# Patient Record
Sex: Female | Born: 1990 | Race: Black or African American | Hispanic: No | Marital: Single | Smoking: Current every day smoker
Health system: Southern US, Community
[De-identification: ages and names within clinical notes are randomized; demographics above are authoritative.]

## PROBLEM LIST (undated history)

## (undated) ENCOUNTER — Inpatient Hospital Stay (HOSPITAL_COMMUNITY): Payer: Self-pay

## (undated) DIAGNOSIS — Z3490 Encounter for supervision of normal pregnancy, unspecified, unspecified trimester: Secondary | ICD-10-CM

## (undated) DIAGNOSIS — F419 Anxiety disorder, unspecified: Secondary | ICD-10-CM

## (undated) DIAGNOSIS — F32A Depression, unspecified: Secondary | ICD-10-CM

## (undated) DIAGNOSIS — U071 COVID-19: Secondary | ICD-10-CM

## (undated) DIAGNOSIS — J302 Other seasonal allergic rhinitis: Secondary | ICD-10-CM

## (undated) DIAGNOSIS — D649 Anemia, unspecified: Secondary | ICD-10-CM

## (undated) DIAGNOSIS — Z309 Encounter for contraceptive management, unspecified: Secondary | ICD-10-CM

## (undated) DIAGNOSIS — B009 Herpesviral infection, unspecified: Secondary | ICD-10-CM

## (undated) DIAGNOSIS — M5430 Sciatica, unspecified side: Secondary | ICD-10-CM

## (undated) DIAGNOSIS — J189 Pneumonia, unspecified organism: Secondary | ICD-10-CM

## (undated) DIAGNOSIS — Z8709 Personal history of other diseases of the respiratory system: Secondary | ICD-10-CM

## (undated) DIAGNOSIS — R87629 Unspecified abnormal cytological findings in specimens from vagina: Secondary | ICD-10-CM

## (undated) DIAGNOSIS — K219 Gastro-esophageal reflux disease without esophagitis: Secondary | ICD-10-CM

## (undated) DIAGNOSIS — R51 Headache: Secondary | ICD-10-CM

## (undated) DIAGNOSIS — J45909 Unspecified asthma, uncomplicated: Secondary | ICD-10-CM

## (undated) HISTORY — DX: Herpesviral infection, unspecified: B00.9

## (undated) HISTORY — DX: Unspecified abnormal cytological findings in specimens from vagina: R87.629

## (undated) HISTORY — DX: Headache: R51

## (undated) HISTORY — DX: Encounter for contraceptive management, unspecified: Z30.9

## (undated) HISTORY — PX: BACK SURGERY: SHX140

## (undated) HISTORY — PX: OTHER SURGICAL HISTORY: SHX169

## (undated) HISTORY — DX: Sciatica, unspecified side: M54.30

## (undated) HISTORY — DX: Personal history of other diseases of the respiratory system: Z87.09

## (undated) HISTORY — DX: Unspecified asthma, uncomplicated: J45.909

---
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
## 2009-08-12 ENCOUNTER — Emergency Department (HOSPITAL_COMMUNITY): Admission: EM | Admit: 2009-08-12 | Discharge: 2009-08-12 | Payer: Self-pay | Admitting: Emergency Medicine

## 2009-12-06 ENCOUNTER — Emergency Department (HOSPITAL_COMMUNITY): Admission: EM | Admit: 2009-12-06 | Discharge: 2009-12-06 | Payer: Self-pay | Admitting: Emergency Medicine

## 2010-07-11 ENCOUNTER — Emergency Department (HOSPITAL_COMMUNITY)
Admission: EM | Admit: 2010-07-11 | Discharge: 2010-07-11 | Payer: Self-pay | Source: Home / Self Care | Admitting: Emergency Medicine

## 2010-11-05 LAB — CBC
MCHC: 33.2 g/dL (ref 30.0–36.0)
RDW: 13.1 % (ref 11.5–15.5)

## 2010-11-05 LAB — URINALYSIS, ROUTINE W REFLEX MICROSCOPIC
Ketones, ur: NEGATIVE mg/dL
Nitrite: NEGATIVE
Protein, ur: NEGATIVE mg/dL

## 2010-11-05 LAB — GC/CHLAMYDIA PROBE AMP, GENITAL
Chlamydia, DNA Probe: NEGATIVE
GC Probe Amp, Genital: NEGATIVE

## 2010-11-05 LAB — PREGNANCY, URINE: Preg Test, Ur: NEGATIVE

## 2010-11-25 LAB — BASIC METABOLIC PANEL
BUN: 8 mg/dL (ref 6–23)
Calcium: 9.2 mg/dL (ref 8.4–10.5)
GFR calc non Af Amer: >60 mL/min (ref >60)
Glucose, Bld: 99 mg/dL (ref 70–99)
Potassium: 3.3 mEq/L — ABNORMAL LOW (ref 3.5–5.1)

## 2010-11-25 LAB — DIFFERENTIAL
Basophils Absolute: 0 10*3/uL (ref 0.0–0.1)
Basophils Relative: 1 % (ref 0–1)
Eosinophils Absolute: 0.6 10*3/uL (ref 0.0–0.7)
Eosinophils Relative: 9 % — ABNORMAL HIGH (ref 0–5)
Lymphocytes Relative: 35 % (ref 12–46)

## 2010-11-25 LAB — CBC
HCT: 37.9 % (ref 36.0–46.0)
MCHC: 33.6 g/dL (ref 30.0–36.0)
Platelets: 208 10*3/uL (ref 150–400)
RDW: 13.3 % (ref 11.5–15.5)

## 2011-01-10 NOTE — Discharge Summary (Signed)
Kaylee Miller, Kaylee Miller            ACCOUNT NO.:  [id number]   MEDICAL RECORD NO.:  [id number]          PATIENT TYPE:  INP   LOCATION:  A413                          FACILITY:  [facility]   PHYSICIAN:  Lazaro Arms, M.D.   DATE OF BIRTH:  22-Oct-1990   DATE OF ADMISSION:  06/10/2006  DATE OF DISCHARGE:  10/21/2007LH                                 DISCHARGE SUMMARY   DISCHARGE DIAGNOSES:  1. Status post a primary cesarean section.  2. Secondary arrest of diltation, first stage of labor.  3. Unremarkable postoperative course.   PROCEDURES:  As above.   Please refer to the history and physical __________ on the chart.   DETAILS OF ADMISSION TO THE HOSPITAL:  The patient came in early latent  phase labor.  She was augmented.  She underwent an epidural.  Got to 7 cm  and really just did not get past 7-8 cm despite adequate labor.  She was  taken for cesarean--please see the op note for details.   Postoperatively the patient has done well.  She tolerated clear liquids from  a regular diet.  She has voided without symptoms and has been ambulatory.  Her hemoglobin and hematocrit on postoperative day #1 was 9.2, and 26.7; on  postoperative day #3 was 7.6 and 22.1; but her bleeding was appropriate.  Her uterus was firm; and the patient is asymptomatic in ambulation.  Her  incision was clean, dry and intact.  She tolerated clear liquids and a  regular diet. She is voiding without symptoms, and tolerating oral pain  medicines. She is discharge to home on the morning of postoperative day #3  in good stable condition, and to followup in the office on Wednesday to have  her staples removed.  She is given instruction precautions for term prior to  that time.  She was given Tylox and Motrin for pain.      Lazaro Arms, M.D.  Electronically Signed     LHE/MEDQ  D:  06/14/2006  T:  06/15/2006  Job:  [id number]

## 2011-01-10 NOTE — H&P (Signed)
NAMEJAZZLYN, Kaylee Miller            ACCOUNT NO.:  [id number]   MEDICAL RECORD NO.:  [id number]          PATIENT TYPE:  inp   LOCATION:  LDR1                          FACILITY:  [facility]   PHYSICIAN:  [name], M.D. DATE OF BIRTH:  05/03/1991   DATE OF ADMISSION:  06/10/2006  DATE OF DISCHARGE:  LH                                HISTORY & PHYSICAL   REASON FOR ADMISSION:  Pregnancy at 39 weeks 5 days for elective induction  of labor due to maternal discomfort and insomnia.   MEDICAL HISTORY:  Positive for chronic bronchitis.   SURGICAL HISTORY:  Positive for adenoids and tubes in her ear.   ALLERGIES:  No known allergies.   FAMILY HISTORY:  Positive for hypertension and diabetes.   PRENATAL COURSE:  Uneventful.  Blood type is A positive.  UDS negative.  Rubella is immune.  Hepatitis B surface antigen negative.  HIV negative.  HSV negative.  Serology is nonreactive.  Pap is normal.  Repeat GC and  chlamydia are also negative.  AFP was abnormal and ultrasound revealed  normal.  GBS is negative.  A 28-week hemoglobin 10.9, 28-week hematocrit  33.5.  One-hour glucose 105.  Sickle cell screen is negative.   PHYSICAL EXAMINATION:  Vital signs are stable.  Fetal heart rate pattern is  stable.   PLAN:  We are going to admit for a Foley bulb induction of labor.      [name], Lanier Clam      [name], M.D.  Electronically Signed    DL/MEDQ  D:  16/05/9603  T:  06/09/2006  Job:  [id number]   cc:   Francoise Schaumann. Milford Cage DO, FAAP  Fax: [phone]

## 2011-01-10 NOTE — H&P (Signed)
Kaylee Miller, Kaylee Miller            ACCOUNT NO.:  [id number]   MEDICAL RECORD NO.:  [id number]          PATIENT TYPE:  OIB   LOCATION:  LDR5                          FACILITY:  [facility]   PHYSICIAN:  [name], M.D. DATE OF BIRTH:  [birth year]   DATE OF ADMISSION:  04/20/2006  DATE OF DISCHARGE:  LH                                HISTORY & PHYSICAL   HISTORY AND PHYSICAL OBSERVATION NOTE   REASON FOR ADMISSION:  Pregnancy at 32 weeks with nosebleed and lower  abdominal pain on the right side.   HISTORY OF PRESENT ILLNESS:  The patient was evaluated in the emergency room  in regards to nosebleed.  By the time the patient had arrived the nosebleed  had already abated.  She was then assessed and sent upstairs to be  monitored.   PAST MEDICAL HISTORY:  Positive for bronchitis.   PAST SURGICAL HISTORY:  Positive for tubes and adenoids.   ALLERGIES:  She has no known drug allergies.   OBSTETRICAL HISTORY:  Her prenatal course was essentially uneventful.  She  has had several nosebleeds during the course of the pregnancy but she also  states that she had then when she was a small child.   PHYSICAL EXAMINATION:  VITAL SIGNS:  Vital signs are stable.  PELVIC:  Cervix is firm, posterior, long, closed. Presenting part ballots.   PLAN:  The patient will be placed on a monitor to be evaluated.  We will  observe her.  Will get a CBC.  Check a urine. If no uterine contractions we  will discharge her home with followup one day this week in the office.      [name], Lanier Clam      [name], M.D.  Electronically Signed    DL/MEDQ  D:  16/05/9603  T:  04/20/2006  Job:  [id number]

## 2011-01-10 NOTE — Op Note (Signed)
Kaylee Miller, Kaylee Miller            ACCOUNT NO.:  [id number]   MEDICAL RECORD NO.:  [id number]          PATIENT TYPE:  INP   LOCATION:  A413                          FACILITY:  [facility]   PHYSICIAN:  [name], M.D. DATE OF BIRTH:  1990-11-21   DATE OF PROCEDURE:  DATE OF DISCHARGE:                                 OPERATIVE REPORT   PREOPERATIVE DIAGNOSIS:  Pregnancy, 39 plus [redacted] weeks gestation. Failed  induction secondary to cephalopelvic disproportion.   POSTOPERATIVE DIAGNOSIS:  Pregnancy, 39 plus [redacted] weeks gestation. Failed  induction secondary to cephalopelvic disproportion. Increased intraoperative  blood loss secondary to lower uterine segment varicosities.   SURGEON:  [name], M.D.   ASSISTANTMarlinda Mike, R.N.   ANESTHESIA:  Epidural, Dr. Despina Hidden, topped up for cesarean section.   COMPLICATIONS:  Increased blood loss from lower uterine segment where we  made the transverse uterine incision.   FINDINGS:  Healthy female infant, Apgars 9 and 9 in occiput posterior  presentation with arrest of labor at anterior lip dilation.   DETAILS OF PROCEDURE:  The patient was taken to the operating room, prepped  and draped for lower abdominal surgery. After epidural was adequately topped  up, a Pfannenstiel-type incision was performed, sharply excising through the  skin, Bovie cautery through the fatty tissues. Fascia opened transversely.  Peritoneum opened in the midline. The bladder flap was developed on the  lower uterine segment. Transverse uterine incision was made, extended  laterally using index finger traction, and the operator's left hand placed  beneath the vertex to rotate the head into the incision. This was somewhat  challenging as the occiput posterior presentation was impacted markedly in  the pelvis, and elevation of the shoulders were necessary to get the end  into the incision, but then it delivered easily thereafter. No traction on  the head was performed after  the head was released, after the head entered  the incision. Infant had bulb suctioning performed of the pharynx prior to  delivery of the fetal body. Infant was passed to Dr. Marvel Plan for subsequent  care. See his notes for details. Apgars 9 and 9 were assigned. Cord blood  samples were obtained, and placenta delivered [facility] presentation. Membranes  were removed completely. The lower uterine segment where we were operating  was filled with a plexus of varicosities. We tried to control it with  Pennington clamps, but nonetheless a huge amount of varicosities to deal  with. The right side of the uterine incision was packed with laparotomy tape  while we worked on the left side, then when reaching the midline, the right  side was initiated at the corner and brought back to midline. The second  layer of running locking closure was performed. We encountered several  varicosities right in the midline that required additional suturing to  control them. Eventually, hemostasis was adequate, but blood loss was  significant for this portion of the case, estimated 1500 cc. Once the lower  uterine segment was completely controlled, we were in good shape. Bladder  flap was reapproximated. Abdomen irrigated copiously. Some clots extracted  from the  abdomen, and the peritoneal fluid was somewhat relatively clear,  and then 2-0 chromic closure of the peritoneum performed. The fascia was  closed with 0 Vicryl after muscles pulled together in the midline. The  subcutaneous fatty tissues were irrigated with antibiotic solution,  confirmed as hemostatic. We reapproximated with interrupted 2-0 plain x3  sutures and then staple closure of the skin performed. The patient tolerated  the procedure well and went to the recovery room in good condition,  remaining with pulse in the 96 range at the end of the procedure.  Postoperative hemoglobin is scheduled this evening.      [name], M.D.   Electronically Signed     JVF/MEDQ  D:  06/11/2006  T:  06/13/2006  Job:  [id number]

## 2011-01-10 NOTE — H&P (Signed)
Kaylee Miller, ROCHETTE            ACCOUNT NO.:  [id number]   MEDICAL RECORD NO.:  [id number]          PATIENT TYPE:  OIB   LOCATION:  A415                          FACILITY:  [facility]   PHYSICIAN:  [name], M.D. DATE OF BIRTH:  1991-05-16   DATE OF ADMISSION:  05/25/2006  DATE OF DISCHARGE:  LH                                HISTORY & PHYSICAL   REASON FOR ADMISSION:  Pregnancy at 37 weeks with mild irregular uterine  contractions. Kaylee Miller had been up and down with discomfort most of the  night. Presents with irregular uterine contractions at present.   PAST MEDICAL HISTORY:  Positive for chronic bronchitis.   PAST SURGICAL HISTORY:  Positive for _tonsillectomy__ and adenoids.   ALLERGIES:  She has no known allergies.   FAMILY HISTORY:  Positive for hypertension, diabetes, and coronary artery  disease.   PRENATAL COURSE:  Essentially uneventful. Blood type A positive. UGS  negative, rubella immune, hepatitis B surface antigen negative, HIV  negative, HSV negative, __rpr________ nonreactive. Pap class I. GC negative,  GBS negative. AFP was at risk for open spine defect.   LABORATORY DATA:  Hemoglobin 2.9, hematocrit 33.5. One hour glucose 195.   PHYSICAL EXAMINATION:  VITAL SIGNS:  Stable.  GENITALIA:  Cervix is 1 cm. The patient is having irregular uterine  contractions.      Kaylee Miller, Kaylee Miller      [name], M.D.  Electronically Signed    DL/MEDQ  D:  16/05/9603  T:  05/25/2006  Job:  [id number]

## 2011-01-10 NOTE — H&P (Signed)
NAMEKONSTANCE, HAPPEL            ACCOUNT NO.:  [id number]   MEDICAL RECORD NO.:  [id number]          PATIENT TYPE:  OBV   LOCATION:  LDR1                          FACILITY:  [facility]   PHYSICIAN:  [name], M.D. DATE OF BIRTH:  [birth year]   DATE OF ADMISSION:  06/09/2006  DATE OF DISCHARGE:  LH                                HISTORY & PHYSICAL   HISTORY OF PRESENT ILLNESS:  Yailyn is a 20 year old gravida 1 at 19 and 4  who was in with irregular contractions. After several hours of observation,  she rested and slept during the night. Cervix this morning on exam was 1-2,  80 to 90% effaced, -1 to 0 station. After discussing carefully with her the  risks and benefits of induction of labor, she desires induction of labor and  that is planned for Wednesday the 17th.      [name], Lanier Clam      [name], M.D.  Electronically Signed    DL/MEDQ  D:  16/05/9603  T:  06/09/2006  Job:  [id number]   cc:   [facility]

## 2011-01-10 NOTE — Op Note (Signed)
NAMEKORTNIE, STOVALL            ACCOUNT NO.:  [id number]   MEDICAL RECORD NO.:  [id number]          PATIENT TYPE:  INP   LOCATION:  A413                          FACILITY:  [facility]   PHYSICIAN:  Lazaro Arms, M.D.   DATE OF BIRTH:  08-Nov-1990   DATE OF PROCEDURE:  06/11/2006  DATE OF DISCHARGE:                                 OPERATIVE REPORT   HISTORY OF PRESENT ILLNESS:  Deneen is a 20 year old gravida 1, para 0 in  early phase of labor requesting epidural be placed.  She has had IV pain  medications.   The patient was placed in the sitting position.  Betadine prep as usual, 1%  lidocaine was injected in the L3-L4 inner space.  A field drape was placed.  A 17-gauge Tuohy needle was used.  Loss of resistance technique employed,  and the epidural space was found with one pass without difficulty, and 10 cc  of 0.125% bupivacaine plain was given as a test dose without ill effects.  Epidural catheter was fed easily and take down 5 cm of epidural space.  In  addition, 10 cc of 0.125% bupivacaine plain was given to dose up the  epidural.  The continuous infusion of 0.125% bupivacaine with 2 mcg/cc of  Fentanyl was given at 2 cc an hour.      Lazaro Arms, M.D.  Electronically Signed     LHE/MEDQ  D:  06/11/2006  T:  06/12/2006  Job:  [id number]

## 2011-03-10 ENCOUNTER — Emergency Department (HOSPITAL_COMMUNITY)
Admission: EM | Admit: 2011-03-10 | Discharge: 2011-03-10 | Disposition: A | Discharge status: Home / Self Care | Payer: Medicaid Other | Attending: Emergency Medicine | Admitting: Emergency Medicine

## 2011-03-10 ENCOUNTER — Encounter: Payer: Self-pay | Admitting: *Deleted

## 2011-03-10 DIAGNOSIS — J302 Other seasonal allergic rhinitis: Secondary | ICD-10-CM

## 2011-03-10 DIAGNOSIS — H109 Unspecified conjunctivitis: Secondary | ICD-10-CM

## 2011-03-10 DIAGNOSIS — J301 Allergic rhinitis due to pollen: Secondary | ICD-10-CM | POA: Insufficient documentation

## 2011-03-10 DIAGNOSIS — F17200 Nicotine dependence, unspecified, uncomplicated: Secondary | ICD-10-CM | POA: Insufficient documentation

## 2011-03-10 HISTORY — DX: Other seasonal allergic rhinitis: J30.2

## 2011-03-10 MED ORDER — FEXOFENADINE-PSEUDOEPHED ER 60-120 MG PO TB12: 1.0000 | ORAL_TABLET | Freq: Two times a day (BID) | ORAL | Status: AC

## 2011-03-10 MED ORDER — ERYTHROMYCIN 5 MG/GM OP OINT
TOPICAL_OINTMENT | Freq: Once | OPHTHALMIC | Status: AC
  Administered 2011-03-10: 16:00:00 via OPHTHALMIC
  Filled 2011-03-10: qty 3.5

## 2011-03-10 NOTE — ED Notes (Signed)
Pt states redness and minor swelling to eyes. Pt states she has bad allergies and swelling is worse in the mornings. Pt also states her son had recent eye infection and wants to make sure she does not have an infection. Pt also states intermittent headache above eyes. NAD.

## 2011-03-10 NOTE — ED Provider Notes (Signed)
History     Chief Complaint  Patient presents with  . Eye Pain   Patient is a 20 y.o. female presenting with eye pain. The history is provided by the patient.  Eye Pain This is a new problem. The current episode started yesterday. The problem occurs constantly. The problem has been unchanged. Associated symptoms include congestion, headaches and a sore throat. Pertinent negatives include no abdominal pain, arthralgias, chest pain, chills, coughing, fever, joint swelling, nausea, neck pain, numbness, rash, visual change or weakness. Associated symptoms comments: Reports chronic history of allergy including itchy watery eyes and nasal congestion.  Woke yesterday with irritation to both eyes,  And copious yellow thick drainage.  Son was treated for pinkeye last week.  . Exacerbated by: blinking. She has tried nothing for the symptoms.    Past Medical History  Diagnosis Date  . Seasonal allergies     Past Surgical History  Procedure Date  . Adenoids     History reviewed. No pertinent family history.  History  Substance Use Topics  . Smoking status: Current Everyday Smoker    Types: Cigarettes  . Smokeless tobacco: Not on file  . Alcohol Use: Yes     OCC    OB History    Grav Para Term Preterm Abortions TAB SAB Ect Mult Living                  Review of Systems  Constitutional: Negative for fever and chills.  HENT: Positive for congestion, sore throat, rhinorrhea, sneezing and postnasal drip. Negative for neck pain, neck stiffness, sinus pressure and ear discharge.   Eyes: Positive for pain.  Respiratory: Negative for cough, chest tightness and shortness of breath.   Cardiovascular: Negative for chest pain.  Gastrointestinal: Negative for nausea and abdominal pain.  Genitourinary: Negative.   Musculoskeletal: Negative for joint swelling and arthralgias.  Skin: Negative.  Negative for rash and wound.  Neurological: Positive for headaches. Negative for dizziness, weakness,  light-headedness and numbness.  Hematological: Negative.   Psychiatric/Behavioral: Negative.     Physical Exam  Pulse 79  Temp(Src) 98.8 F (37.1 C) (Oral)  Resp 16  Ht 5\' 6"  (1.676 m)  Wt 148 lb (67.132 kg)  BMI 23.89 kg/m2  SpO2 100%  LMP 03/09/2011  Physical Exam  Vitals reviewed. Constitutional: She is oriented to person, place, and time. She appears well-developed and well-nourished.  HENT:  Head: Normocephalic and atraumatic.  Right Ear: Tympanic membrane and ear canal normal.  Left Ear: Tympanic membrane and ear canal normal.  Nose: Mucosal edema and rhinorrhea present. Right sinus exhibits no maxillary sinus tenderness and no frontal sinus tenderness. Left sinus exhibits no maxillary sinus tenderness and no frontal sinus tenderness.  Mouth/Throat: Uvula is midline, oropharynx is clear and moist and mucous membranes are normal.  Eyes: EOM are normal. Pupils are equal, round, and reactive to light. Right eye exhibits discharge. Left eye exhibits discharge. Right conjunctiva is injected. Left conjunctiva is injected.  Neck: Normal range of motion.  Cardiovascular: Normal rate and regular rhythm.   Pulmonary/Chest: Effort normal and breath sounds normal. She has no wheezes.  Abdominal: Soft. Bowel sounds are normal. There is no tenderness.  Musculoskeletal: Normal range of motion.  Neurological: She is alert and oriented to person, place, and time.  Skin: Skin is warm and dry.  Psychiatric: She has a normal mood and affect.    ED Course  Procedures  MDM        Candis Musa,  PA 03/10/11 1620

## 2011-03-15 NOTE — ED Provider Notes (Signed)
Medical screening examination/treatment/procedure(s) were performed by non-physician practitioner and as supervising physician I was immediately available for consultation/collaboration. Devoria Albe, MD, Armando Gang  Ward Givens, MD 03/15/11 [phone]

## 2011-04-02 ENCOUNTER — Other Ambulatory Visit: Payer: Self-pay | Admitting: Adult Health

## 2011-04-02 ENCOUNTER — Other Ambulatory Visit (HOSPITAL_COMMUNITY)
Admission: RE | Admit: 2011-04-02 | Discharge: 2011-04-02 | Disposition: A | Discharge status: Home / Self Care | Payer: Medicaid Other | Source: Ambulatory Visit | Attending: Obstetrics and Gynecology | Admitting: Obstetrics and Gynecology

## 2011-04-02 DIAGNOSIS — Z01419 Encounter for gynecological examination (general) (routine) without abnormal findings: Secondary | ICD-10-CM | POA: Insufficient documentation

## 2011-04-02 DIAGNOSIS — Z113 Encounter for screening for infections with a predominantly sexual mode of transmission: Secondary | ICD-10-CM | POA: Insufficient documentation

## 2012-06-03 ENCOUNTER — Other Ambulatory Visit (HOSPITAL_COMMUNITY)
Admission: RE | Admit: 2012-06-03 | Discharge: 2012-06-03 | Disposition: A | Discharge status: Home / Self Care | Payer: Medicaid Other | Source: Ambulatory Visit | Attending: Obstetrics and Gynecology | Admitting: Obstetrics and Gynecology

## 2012-06-03 ENCOUNTER — Other Ambulatory Visit: Payer: Self-pay | Admitting: Adult Health

## 2012-06-03 DIAGNOSIS — Z3049 Encounter for surveillance of other contraceptives: Secondary | ICD-10-CM | POA: Insufficient documentation

## 2012-06-03 DIAGNOSIS — Z113 Encounter for screening for infections with a predominantly sexual mode of transmission: Secondary | ICD-10-CM | POA: Insufficient documentation

## 2012-11-07 ENCOUNTER — Emergency Department (HOSPITAL_COMMUNITY): Payer: Medicaid Other

## 2012-11-07 ENCOUNTER — Emergency Department (HOSPITAL_COMMUNITY)
Admission: EM | Admit: 2012-11-07 | Discharge: 2012-11-07 | Disposition: A | Discharge status: Home / Self Care | Payer: Medicaid Other | Attending: Emergency Medicine | Admitting: Emergency Medicine

## 2012-11-07 ENCOUNTER — Encounter (HOSPITAL_COMMUNITY): Payer: Self-pay

## 2012-11-07 DIAGNOSIS — F1010 Alcohol abuse, uncomplicated: Secondary | ICD-10-CM | POA: Insufficient documentation

## 2012-11-07 DIAGNOSIS — F17200 Nicotine dependence, unspecified, uncomplicated: Secondary | ICD-10-CM | POA: Insufficient documentation

## 2012-11-07 DIAGNOSIS — S0993XA Unspecified injury of face, initial encounter: Secondary | ICD-10-CM | POA: Insufficient documentation

## 2012-11-07 DIAGNOSIS — R040 Epistaxis: Secondary | ICD-10-CM

## 2012-11-07 DIAGNOSIS — F10929 Alcohol use, unspecified with intoxication, unspecified: Secondary | ICD-10-CM

## 2012-11-07 NOTE — ED Notes (Signed)
Sleeping, family @ bedside.
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
## 2012-11-07 NOTE — ED Notes (Signed)
Woken for discharge, pt groggy but able to get up and dress self. Gait better than upon admission. Given written instructions and discharged with her dad.
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
## 2012-12-08 ENCOUNTER — Other Ambulatory Visit: Payer: Self-pay | Admitting: Obstetrics & Gynecology

## 2012-12-08 DIAGNOSIS — O3680X0 Pregnancy with inconclusive fetal viability, not applicable or unspecified: Secondary | ICD-10-CM

## 2012-12-24 ENCOUNTER — Telehealth: Payer: Self-pay | Admitting: *Deleted

## 2012-12-24 NOTE — Telephone Encounter (Signed)
Pt called and stated she is having really bad pain, pressure, and cramping. She denied any bleeding. I spoke with Dr Despina Hidden and he advised that the pt should be fine and that she should call us back if it got worse or any bleeding started, and to keep her scheduled appointment. I advised the pt of this, the pt was given an appointment for the first part of next week to evaluate the problem.

## 2012-12-27 ENCOUNTER — Other Ambulatory Visit (INDEPENDENT_AMBULATORY_CARE_PROVIDER_SITE_OTHER): Payer: Medicaid Other

## 2012-12-27 ENCOUNTER — Other Ambulatory Visit: Payer: Self-pay | Admitting: Obstetrics & Gynecology

## 2012-12-27 ENCOUNTER — Ambulatory Visit (INDEPENDENT_AMBULATORY_CARE_PROVIDER_SITE_OTHER): Payer: Medicaid Other | Admitting: Obstetrics & Gynecology

## 2012-12-27 ENCOUNTER — Encounter: Payer: Self-pay | Admitting: Obstetrics & Gynecology

## 2012-12-27 VITALS — BP 110/60 | Ht 66.0 in | Wt 166.0 lb

## 2012-12-27 DIAGNOSIS — O200 Threatened abortion: Secondary | ICD-10-CM

## 2012-12-27 DIAGNOSIS — N949 Unspecified condition associated with female genital organs and menstrual cycle: Secondary | ICD-10-CM

## 2012-12-27 DIAGNOSIS — O99891 Other specified diseases and conditions complicating pregnancy: Secondary | ICD-10-CM

## 2012-12-27 LAB — US OB COMP LESS 14 WKS

## 2012-12-27 NOTE — Progress Notes (Signed)
Patient ID: Kaylee Miller, female   DOB: 04/30/1991, 22 y.o.   MRN: [id number]

## 2012-12-27 NOTE — Progress Notes (Signed)
Patient ID: Kaylee Miller, female   DOB: 1991-01-02, 22 y.o.   MRN: [id number] Patient presented as work in for LLQ pain in early pregnancy.  No bleeding  Sonogram reveals a viable IUP with corpus luteum on right, FHR 178  Pregnancy all appears good , keep follow up appointment

## 2012-12-27 NOTE — Progress Notes (Signed)
U/S-single IUP with +FCA noted FHR=178BPM, CRL c/w 8+4wks EDD 08/04/2013, cx long and closed, Rt Ov with C.L. Noted no free fluid noted

## 2013-01-04 ENCOUNTER — Other Ambulatory Visit: Payer: Self-pay

## 2013-01-05 ENCOUNTER — Encounter: Payer: Self-pay | Admitting: *Deleted

## 2013-01-05 DIAGNOSIS — G43909 Migraine, unspecified, not intractable, without status migrainosus: Secondary | ICD-10-CM

## 2013-01-05 DIAGNOSIS — B009 Herpesviral infection, unspecified: Secondary | ICD-10-CM

## 2013-01-06 ENCOUNTER — Ambulatory Visit (INDEPENDENT_AMBULATORY_CARE_PROVIDER_SITE_OTHER): Payer: Medicaid Other | Admitting: Adult Health

## 2013-01-06 ENCOUNTER — Encounter: Payer: Self-pay | Admitting: Adult Health

## 2013-01-06 VITALS — BP 110/68 | Wt 177.0 lb

## 2013-01-06 DIAGNOSIS — Z98891 History of uterine scar from previous surgery: Secondary | ICD-10-CM | POA: Insufficient documentation

## 2013-01-06 DIAGNOSIS — O98519 Other viral diseases complicating pregnancy, unspecified trimester: Secondary | ICD-10-CM

## 2013-01-06 DIAGNOSIS — Z3481 Encounter for supervision of other normal pregnancy, first trimester: Secondary | ICD-10-CM

## 2013-01-06 DIAGNOSIS — O34219 Maternal care for unspecified type scar from previous cesarean delivery: Secondary | ICD-10-CM

## 2013-01-06 DIAGNOSIS — Z1389 Encounter for screening for other disorder: Secondary | ICD-10-CM

## 2013-01-06 DIAGNOSIS — Z331 Pregnant state, incidental: Secondary | ICD-10-CM

## 2013-01-06 LAB — CBC
Platelets: 224 10*3/uL (ref 150–400)
RBC: 4.3 MIL/uL (ref 3.87–5.11)
RDW: 13.6 % (ref 11.5–15.5)
WBC: 6.4 10*3/uL (ref 4.0–10.5)

## 2013-01-06 LAB — POCT URINALYSIS DIPSTICK
Blood, UA: NEGATIVE
Protein, UA: NEGATIVE

## 2013-01-06 NOTE — Patient Instructions (Addendum)
Follow up in 2 weeks IT/NT and see fran TRY flintstones Pregnancy, First Trimester The first trimester is the first 3 months your baby is growing inside you. It is important to follow your doctor's instructions. HOME CARE   Do not smoke.  Do not drink alcohol.  Only take medicine as told by your doctor.  Exercise.  Eat healthy foods. Eat regular, well-balanced meals.  You can have sex (intercourse) if there are no other problems with the pregnancy.  Things that help with morning sickness:  Eat soda crackers before getting up in the morning.  Eat 4 to 5 small meals rather than 3 large meals.  Drink liquids between meals, not during meals.  Go to all appointments as told.  Take all vitamins or supplements as told by your doctor. GET HELP RIGHT AWAY IF:   You develop a fever.  You have a bad smelling fluid that is leaking from your vagina.  There is bleeding from the vagina.  You develop severe belly (abdominal) or back pain.  You throw up (vomit) blood. It may look like coffee grounds.  You lose more than 2 pounds in a week.  You gain 5 pounds or more in a week.  You gain more than 2 pounds in a week and you see puffiness (swelling) in your feet, ankles, or legs.  You have severe dizziness or pass out (faint).  You are around people who have [place] measles, chickenpox, or fifth disease.  You have a headache, watery poop (diarrhea), pain with peeing (urinating), or cannot breath right. Document Released: 01/28/2008 Document Revised: 11/03/2011 Document Reviewed: 01/28/2008 [facility] Patient Information 2013 [place], [place].

## 2013-01-06 NOTE — Progress Notes (Signed)
  Subjective:    Kaylee Miller is a G2P1000 [redacted]w[redacted]d being seen today for her first obstetrical visit.  Her obstetrical history is significant for nausea and vomiting. She thinks it is her prenatal vitamins causing it, will stop them and try flintsones.She is a prior C-section and is thinking about VBAC. And she thinks she wants a BTL.  Pregnancy history fully reviewed.  Patient reports no bleeding.  Filed Vitals:   01/06/13 1146  BP: 110/68  Weight: 177 lb (80.287 kg)    HISTORY: OB History   Grav Para Term Preterm Abortions TAB SAB Ect Mult Living   2 1 1             # Outc Date GA Lbr Len/2nd Wgt Sex Del Anes PTL Lv   1 TRM 10/07 [redacted]w[redacted]d   M LTCS EPI     2 CUR              Past Medical History  Diagnosis Date  . Seasonal allergies   . Headache     migraines  . HSV-2 (herpes simplex virus 2) infection   . History of bronchitis    Past Surgical History  Procedure Laterality Date  . Adenoids    . Cesarean section  2007  . Tubes in ears     Family History  Problem Relation Age of Onset  . Diabetes Other   . CAD Other   . Cancer Mother   . Hypertension Father   . CAD Maternal Grandfather   . CAD Paternal Grandmother   . CAD Paternal Grandfather      Exam       Pelvic Exam:    Perineum: deferred   Vulva: deferred   Vagina:  deferred   Uterus    10week size     Cervix: deferred   Adnexa: Not palpable   Urinary:  deferred    System: Breast:  + tenderness no exam   Skin: normal coloration and turgor, no rashes    Neurologic: oriented, normal, normal mood   Extremities: normal strength, tone, and muscle mass   HEENT PERRLA   Mouth/Teeth mucous membranes moist, pharynx normal without lesions   Neck supple and no masses   Cardiovascular: regular rate and rhythm   Respiratory:  appears well, vitals normal, no respiratory distress, acyanotic, normal RR   Abdomen: soft, non-tender; bowel sounds normal; no masses,  no organomegaly          Assessment:     Pregnancy: G2P1000 Patient Active Problem List   Diagnosis Date Noted  . Migraines 01/05/2013  . HSV-2 (herpes simplex virus 2) infection 01/05/2013    nausea and vomiting    Plan:     Initial labs drawn. Stop prenatal vitamins and try flintstones complete with fe 2 a day Problem list reviewed and updated. Genetic Screening discussed Integrated Screen: requested.  Ultrasound discussed; fetal survey: requested.  Follow up in 2 weeks.  Irais Mottram 01/06/2013

## 2013-01-06 NOTE — Progress Notes (Signed)
Pt here for New OB, Pt states that she may need a different prenatal vitamin, her current makes her sick. She also stated that she has noticed some pain in the lower part of her stomach after she has been on her feet for a while. Pt denies any other problems at this time. Pt was given CCNC form and lab consents to read over and sign. Pt had a normal pap on 06/03/12.

## 2013-01-07 LAB — DRUG SCREEN, URINE, NO CONFIRMATION
Amphetamine Screen, Ur: NEGATIVE
Benzodiazepines.: NEGATIVE
Marijuana Metabolite: NEGATIVE
Methadone: NEGATIVE
Phencyclidine (PCP): NEGATIVE
Propoxyphene: NEGATIVE

## 2013-01-07 LAB — GC/CHLAMYDIA PROBE AMP: GC Probe RNA: NEGATIVE

## 2013-01-07 LAB — ABO AND RH: Rh Type: POSITIVE

## 2013-01-07 LAB — HEPATITIS B SURFACE ANTIGEN: Hepatitis B Surface Ag: NEGATIVE

## 2013-01-07 LAB — URINALYSIS
Nitrite: NEGATIVE
Protein, ur: NEGATIVE mg/dL
Specific Gravity, Urine: 1.016 (ref 1.005–1.030)
Urobilinogen, UA: 1 mg/dL (ref 0.0–1.0)

## 2013-01-07 LAB — VARICELLA ZOSTER ANTIBODY, IGG: Varicella IgG: 3437 Index — ABNORMAL HIGH (ref <135.00)

## 2013-01-07 LAB — RUBELLA SCREEN: Rubella: 1.34 Index — ABNORMAL HIGH (ref <0.90)

## 2013-01-07 LAB — OXYCODONE SCREEN, UA, RFLX CONFIRM: Oxycodone Screen, Ur: NEGATIVE ng/mL

## 2013-01-07 LAB — HIV ANTIBODY (ROUTINE TESTING W REFLEX): HIV: NONREACTIVE

## 2013-01-07 LAB — TSH: TSH: 1.17 u[IU]/mL (ref 0.350–4.500)

## 2013-01-08 LAB — URINE CULTURE: Colony Count: 7000

## 2013-01-20 ENCOUNTER — Other Ambulatory Visit: Payer: Medicaid Other

## 2013-01-20 ENCOUNTER — Encounter: Payer: Medicaid Other | Admitting: Obstetrics & Gynecology

## 2013-01-24 ENCOUNTER — Ambulatory Visit (INDEPENDENT_AMBULATORY_CARE_PROVIDER_SITE_OTHER): Payer: Medicaid Other

## 2013-01-24 ENCOUNTER — Encounter: Payer: Self-pay | Admitting: Women's Health

## 2013-01-24 ENCOUNTER — Other Ambulatory Visit: Payer: Self-pay | Admitting: Women's Health

## 2013-01-24 ENCOUNTER — Ambulatory Visit (INDEPENDENT_AMBULATORY_CARE_PROVIDER_SITE_OTHER): Payer: Medicaid Other | Admitting: Women's Health

## 2013-01-24 VITALS — BP 102/60 | Wt 163.0 lb

## 2013-01-24 DIAGNOSIS — O34219 Maternal care for unspecified type scar from previous cesarean delivery: Secondary | ICD-10-CM

## 2013-01-24 DIAGNOSIS — Z3481 Encounter for supervision of other normal pregnancy, first trimester: Secondary | ICD-10-CM

## 2013-01-24 DIAGNOSIS — Z98891 History of uterine scar from previous surgery: Secondary | ICD-10-CM

## 2013-01-24 DIAGNOSIS — Z1389 Encounter for screening for other disorder: Secondary | ICD-10-CM

## 2013-01-24 DIAGNOSIS — Z331 Pregnant state, incidental: Secondary | ICD-10-CM

## 2013-01-24 DIAGNOSIS — Z36 Encounter for antenatal screening of mother: Secondary | ICD-10-CM

## 2013-01-24 DIAGNOSIS — O98519 Other viral diseases complicating pregnancy, unspecified trimester: Secondary | ICD-10-CM

## 2013-01-24 DIAGNOSIS — B009 Herpesviral infection, unspecified: Secondary | ICD-10-CM

## 2013-01-24 DIAGNOSIS — Z3480 Encounter for supervision of other normal pregnancy, unspecified trimester: Secondary | ICD-10-CM | POA: Insufficient documentation

## 2013-01-24 LAB — POCT URINALYSIS DIPSTICK
Ketones, UA: NEGATIVE
Nitrite, UA: NEGATIVE
Protein, UA: NEGATIVE

## 2013-01-24 NOTE — Progress Notes (Signed)
Denies cramping, lof, vb, urinary frequency, urgency, hesitancy, or dysuria.  Reports allergies- benadryl helping, but makes sleepy. Recommended trying zyrtec or claritin. Reviewed warning s/s to report.  Interested in VBAC and waterbirth-discussed both. All questions answered. 1st IT today. F/U in 4wks for 2nd IT and visit.

## 2013-01-24 NOTE — Patient Instructions (Signed)
Pregnancy - First Trimester  During sexual intercourse, millions of sperm go into the vagina. Only 1 sperm will penetrate and fertilize the female egg while it is in the Fallopian tube. One week later, the fertilized egg implants into the wall of the uterus. An embryo begins to develop into a baby. At 6 to 8 weeks, the eyes and face are formed and the heartbeat can be seen on ultrasound. At the end of 12 weeks (first trimester), all the baby's organs are formed. Now that you are pregnant, you will want to do everything you can to have a healthy baby. Two of the most important things are to get good prenatal care and follow your caregiver's instructions. Prenatal care is all the medical care you receive before the baby's birth. It is given to prevent, find, and treat problems during the pregnancy and childbirth.  PRENATAL EXAMS  · During prenatal visits, your weight, blood pressure, and urine are checked. This is done to make sure you are healthy and progressing normally during the pregnancy.  · A pregnant woman should gain 25 to 35 pounds during the pregnancy. However, if you are overweight or underweight, your caregiver will advise you regarding your weight.  · Your caregiver will ask and answer questions for you.  · Blood work, cervical cultures, other necessary tests, and a Pap test are done during your prenatal exams. These tests are done to check on your health and the probable health of your baby. Tests are strongly recommended and done for HIV with your permission. This is the virus that causes AIDS. These tests are done because medicines can be given to help prevent your baby from being born with this infection should you have been infected without knowing it. Blood work is also used to find out your blood type, previous infections, and follow your blood levels (hemoglobin).  · Low hemoglobin (anemia) is common during pregnancy. Iron and vitamins are given to help prevent this. Later in the pregnancy, blood  tests for diabetes will be done along with any other tests if any problems develop.  · You may need other tests to make sure you and the baby are doing well.  CHANGES DURING THE FIRST TRIMESTER   Your body goes through many changes during pregnancy. They vary from person to person. Talk to your caregiver about changes you notice and are concerned about. Changes can include:  · Your menstrual period stops.  · The egg and sperm carry the genes that determine what you look like. Genes from you and your partner are forming a baby. The female genes determine whether the baby is a boy or a girl.  · Your body increases in girth and you may feel bloated.  · Feeling sick to your stomach (nauseous) and throwing up (vomiting). If the vomiting is uncontrollable, call your caregiver.  · Your breasts will begin to enlarge and become tender.  · Your nipples may stick out more and become darker.  · The need to urinate more. Painful urination may mean you have a bladder infection.  · Tiring easily.  · Loss of appetite.  · Cravings for certain kinds of food.  · At first, you may gain or lose a couple of pounds.  · You may have changes in your emotions from day to day (excited to be pregnant or concerned something may go wrong with the pregnancy and baby).  · You may have more vivid and strange dreams.  HOME CARE INSTRUCTIONS   ·   It is very important to avoid all smoking, alcohol and non-prescribed drugs during your pregnancy. These affect the formation and growth of the baby. Avoid chemicals while pregnant to ensure the delivery of a healthy infant.  · Start your prenatal visits by the 12th week of pregnancy. They are usually scheduled monthly at first, then more often in the last 2 months before delivery. Keep your caregiver's appointments. Follow your caregiver's instructions regarding medicine use, blood and lab tests, exercise, and diet.  · During pregnancy, you are providing food for you and your baby. Eat regular, well-balanced  meals. Choose foods such as meat, fish, milk and other low fat dairy products, vegetables, fruits, and whole-grain breads and cereals. Your caregiver will tell you of the ideal weight gain.  · You can help morning sickness by keeping soda crackers at the bedside. Eat a couple before arising in the morning. You may want to use the crackers without salt on them.  · Eating 4 to 5 small meals rather than 3 large meals a day also may help the nausea and vomiting.  · Drinking liquids between meals instead of during meals also seems to help nausea and vomiting.  · A physical sexual relationship may be continued throughout pregnancy if there are no other problems. Problems may be early (premature) leaking of amniotic fluid from the membranes, vaginal bleeding, or belly (abdominal) pain.  · Exercise regularly if there are no restrictions. Check with your caregiver or physical therapist if you are unsure of the safety of some of your exercises. Greater weight gain will occur in the last 2 trimesters of pregnancy. Exercising will help:  · Control your weight.  · Keep you in shape.  · Prepare you for labor and delivery.  · Help you lose your pregnancy weight after you deliver your baby.  · Wear a good support or jogging bra for breast tenderness during pregnancy. This may help if worn during sleep too.  · Ask when prenatal classes are available. Begin classes when they are offered.  · Do not use hot tubs, steam rooms, or saunas.  · Wear your seat belt when driving. This protects you and your baby if you are in an accident.  · Avoid raw meat, uncooked cheese, cat litter boxes, and soil used by cats throughout the pregnancy. These carry germs that can cause birth defects in the baby.  · The first trimester is a good time to visit your dentist for your dental health. Getting your teeth cleaned is okay. Use a softer toothbrush and brush gently during pregnancy.  · Ask for help if you have financial, counseling, or nutritional needs  during pregnancy. Your caregiver will be able to offer counseling for these needs as well as refer you for other special needs.  · Do not take any medicines or herbs unless told by your caregiver.  · Inform your caregiver if there is any mental or physical domestic violence.  · Make a list of emergency phone numbers of family, friends, hospital, and police and fire departments.  · Write down your questions. Take them to your prenatal visit.  · Do not douche.  · Do not cross your legs.  · If you have to stand for long periods of time, rotate you feet or take small steps in a circle.  · You may have more vaginal secretions that may require a sanitary pad. Do not use tampons or scented sanitary pads.  MEDICINES AND DRUG USE IN PREGNANCY  ·   Take prenatal vitamins as directed. The vitamin should contain 1 milligram of folic acid. Keep all vitamins out of reach of children. Only a couple vitamins or tablets containing iron may be fatal to a baby or young child when ingested.  · Avoid use of all medicines, including herbs, over-the-counter medicines, not prescribed or suggested by your caregiver. Only take over-the-counter or prescription medicines for pain, discomfort, or fever as directed by your caregiver. Do not use aspirin, ibuprofen, or naproxen unless directed by your caregiver.  · Let your caregiver also know about herbs you may be using.  · Alcohol is related to a number of birth defects. This includes fetal alcohol syndrome. All alcohol, in any form, should be avoided completely. Smoking will cause low birth rate and premature babies.  · Street or illegal drugs are very harmful to the baby. They are absolutely forbidden. A baby born to an addicted mother will be addicted at birth. The baby will go through the same withdrawal an adult does.  · Let your caregiver know about any medicines that you have to take and for what reason you take them.  SEEK MEDICAL CARE IF:   You have any concerns or worries during your  pregnancy. It is better to call with your questions if you feel they cannot wait, rather than worry about them.  SEEK IMMEDIATE MEDICAL CARE IF:   · An unexplained oral temperature above 102° F (38.9° C) develops, or as your caregiver suggests.  · You have leaking of fluid from the vagina (birth canal). If leaking membranes are suspected, take your temperature and inform your caregiver of this when you call.  · There is vaginal spotting or bleeding. Notify your caregiver of the amount and how many pads are used.  · You develop a bad smelling vaginal discharge with a change in the color.  · You continue to feel sick to your stomach (nauseated) and have no relief from remedies suggested. You vomit blood or coffee ground-like materials.  · You lose more than 2 pounds of weight in 1 week.  · You gain more than 2 pounds of weight in 1 week and you notice swelling of your face, hands, feet, or legs.  · You gain 5 pounds or more in 1 week (even if you do not have swelling of your hands, face, legs, or feet).  · You get exposed to [place] measles and have never had them.  · You are exposed to fifth disease or chickenpox.  · You develop belly (abdominal) pain. Round ligament discomfort is a common non-cancerous (benign) cause of abdominal pain in pregnancy. Your caregiver still must evaluate this.  · You develop headache, fever, diarrhea, pain with urination, or shortness of breath.  · You fall or are in a car accident or have any kind of trauma.  · There is mental or physical violence in your home.  Document Released: 08/05/2001 Document Revised: 05/05/2012 Document Reviewed: 02/06/2009  [employer]® Patient Information ©2014 [employer].

## 2013-01-24 NOTE — Progress Notes (Signed)
Pain in center of belly. Urinary frequency.

## 2013-01-24 NOTE — Progress Notes (Signed)
U/S(12+4wks)-single IUP with +FCA noted, CRL c/w dates, cx long and closed, bilateral adnexa wnl, NB present NT=1.18mm

## 2013-01-26 ENCOUNTER — Encounter: Payer: Medicaid Other | Admitting: Advanced Practice Midwife

## 2013-01-27 LAB — MATERNAL SCREEN, INTEGRATED #1

## 2013-02-22 ENCOUNTER — Ambulatory Visit (INDEPENDENT_AMBULATORY_CARE_PROVIDER_SITE_OTHER): Payer: Medicaid Other | Admitting: Advanced Practice Midwife

## 2013-02-22 ENCOUNTER — Other Ambulatory Visit: Payer: Self-pay | Admitting: Advanced Practice Midwife

## 2013-02-22 VITALS — BP 94/60 | Wt 168.0 lb

## 2013-02-22 DIAGNOSIS — O98519 Other viral diseases complicating pregnancy, unspecified trimester: Secondary | ICD-10-CM

## 2013-02-22 DIAGNOSIS — Z3481 Encounter for supervision of other normal pregnancy, first trimester: Secondary | ICD-10-CM

## 2013-02-22 DIAGNOSIS — O34219 Maternal care for unspecified type scar from previous cesarean delivery: Secondary | ICD-10-CM

## 2013-02-22 DIAGNOSIS — Z1389 Encounter for screening for other disorder: Secondary | ICD-10-CM

## 2013-02-22 DIAGNOSIS — Z331 Pregnant state, incidental: Secondary | ICD-10-CM

## 2013-02-22 LAB — POCT URINALYSIS DIPSTICK
Blood, UA: NEGATIVE
Nitrite, UA: NEGATIVE
Protein, UA: NEGATIVE

## 2013-02-22 NOTE — Progress Notes (Signed)
C/o nose bleeds, states "gushed out of nose x 2"  Advice given. Taking Tylenol and Robitussin for congestion and cough. C/o "real bad HA" No fever. Had URI symtptoms that started Friday at the [employer].  Getting better.  Will do depo instead of BTL.

## 2013-02-28 LAB — MATERNAL SCREEN, INTEGRATED #2
AFP MoM: 0.73
AFP, Serum: 31.7 ng/mL
Calculated Gestational Age: 17
Crown Rump Length: 67 mm
Inhibin A Dimeric: 130 pg/mL
MSS Trisomy 18 Risk: <1:5000 {titer}
NT MoM: 1.05
Nuchal Translucency: 1.57 mm
PAPP-A MoM: 1.31
hCG MoM: 0.83
hCG, Serum: 18.2 IU/mL

## 2013-03-15 ENCOUNTER — Ambulatory Visit (INDEPENDENT_AMBULATORY_CARE_PROVIDER_SITE_OTHER): Payer: Medicaid Other | Admitting: Advanced Practice Midwife

## 2013-03-15 ENCOUNTER — Ambulatory Visit (INDEPENDENT_AMBULATORY_CARE_PROVIDER_SITE_OTHER): Payer: Medicaid Other

## 2013-03-15 ENCOUNTER — Encounter: Payer: Self-pay | Admitting: Advanced Practice Midwife

## 2013-03-15 VITALS — BP 110/70 | Wt 171.0 lb

## 2013-03-15 DIAGNOSIS — Z1389 Encounter for screening for other disorder: Secondary | ICD-10-CM

## 2013-03-15 DIAGNOSIS — O98519 Other viral diseases complicating pregnancy, unspecified trimester: Secondary | ICD-10-CM

## 2013-03-15 DIAGNOSIS — Z331 Pregnant state, incidental: Secondary | ICD-10-CM

## 2013-03-15 DIAGNOSIS — O34219 Maternal care for unspecified type scar from previous cesarean delivery: Secondary | ICD-10-CM

## 2013-03-15 LAB — POCT URINALYSIS DIPSTICK
Blood, UA: NEGATIVE
Glucose, UA: NEGATIVE
Nitrite, UA: NEGATIVE

## 2013-03-15 NOTE — Progress Notes (Signed)
HAD U/S DONE TODAY.

## 2013-03-15 NOTE — Progress Notes (Signed)
U/S(19+5wks)-active fetus, meas c/w dates, fluid WNL, cx long and closed, bilateral adnexa WNL, no major ABNL noted, female fetus

## 2013-03-15 NOTE — Progress Notes (Signed)
Had anatomy scan.  NOrmal female.    No c/o at this time.  Routine questions about pregnancy answered.  F/U in 4 weeks for LROB.

## 2013-03-31 ENCOUNTER — Telehealth: Payer: Self-pay | Admitting: Obstetrics & Gynecology

## 2013-04-08 NOTE — Telephone Encounter (Signed)
Left message. JSY 

## 2013-04-08 NOTE — Telephone Encounter (Signed)
Spoke with pt. Was wondering if the OTC Prenatal Gummies was ok to take. Also taking OTC Iron. She didn't see iron listed as being in the gummy. Advised this was ok to continue for now  and discuss at next appt on 04/11/13. Pt voiced understanding. JSY

## 2013-04-12 ENCOUNTER — Ambulatory Visit (INDEPENDENT_AMBULATORY_CARE_PROVIDER_SITE_OTHER): Payer: Medicaid Other | Admitting: Women's Health

## 2013-04-12 ENCOUNTER — Encounter: Payer: Self-pay | Admitting: Women's Health

## 2013-04-12 VITALS — BP 108/80 | Wt 180.0 lb

## 2013-04-12 DIAGNOSIS — Z3482 Encounter for supervision of other normal pregnancy, second trimester: Secondary | ICD-10-CM

## 2013-04-12 DIAGNOSIS — O1212 Gestational proteinuria, second trimester: Secondary | ICD-10-CM

## 2013-04-12 DIAGNOSIS — O34219 Maternal care for unspecified type scar from previous cesarean delivery: Secondary | ICD-10-CM

## 2013-04-12 DIAGNOSIS — Z1389 Encounter for screening for other disorder: Secondary | ICD-10-CM

## 2013-04-12 DIAGNOSIS — O98519 Other viral diseases complicating pregnancy, unspecified trimester: Secondary | ICD-10-CM

## 2013-04-12 DIAGNOSIS — Z331 Pregnant state, incidental: Secondary | ICD-10-CM

## 2013-04-12 DIAGNOSIS — Z98891 History of uterine scar from previous surgery: Secondary | ICD-10-CM

## 2013-04-12 LAB — POCT URINALYSIS DIPSTICK
Blood, UA: NEGATIVE
Glucose, UA: NEGATIVE
Ketones, UA: NEGATIVE
Nitrite, UA: NEGATIVE

## 2013-04-12 NOTE — Progress Notes (Signed)
Reports good fm. Denies uc's, lof, vb, urinary frequency, dysuria. Some urgency and hesitancy. Will send urine cx. Reviewed ptl s/s, fm.  All questions answered. VBAC consent signed. F/U in 4wks for PN2 and visit.

## 2013-04-12 NOTE — Patient Instructions (Addendum)
You will have your sugar test next visit.  Please do not eat or drink anything after midnight the night before you come, not even water.  You will be here for at least two hours.    Pregnancy - Second Trimester The second trimester of pregnancy (3 to 6 months) is a period of rapid growth for you and your baby. At the end of the sixth month, your baby is about 9 inches long and weighs 1 1/2 pounds. You will begin to feel the baby move between 18 and 20 weeks of the pregnancy. This is called quickening. Weight gain is faster. A clear fluid (colostrum) may leak out of your breasts. You may feel small contractions of the womb (uterus). This is known as false labor or Braxton-Hicks contractions. This is like a practice for labor when the baby is ready to be born. Usually, the problems with morning sickness have usually passed by the end of your first trimester. Some women develop small dark blotches (called cholasma, mask of pregnancy) on their face that usually goes away after the baby is born. Exposure to the sun makes the blotches worse. Acne may also develop in some pregnant women and pregnant women who have acne, may find that it goes away. PRENATAL EXAMS  Blood work may continue to be done during prenatal exams. These tests are done to check on your health and the probable health of your baby. Blood work is used to follow your blood levels (hemoglobin). Anemia (low hemoglobin) is common during pregnancy. Iron and vitamins are given to help prevent this. You will also be checked for diabetes between 24 and 28 weeks of the pregnancy. Some of the previous blood tests may be repeated.  The size of the uterus is measured during each visit. This is to make sure that the baby is continuing to grow properly according to the dates of the pregnancy.  Your blood pressure is checked every prenatal visit. This is to make sure you are not getting toxemia.  Your urine is checked to make sure you do not have an  infection, diabetes or protein in the urine.  Your weight is checked often to make sure gains are happening at the suggested rate. This is to ensure that both you and your baby are growing normally.  Sometimes, an ultrasound is performed to confirm the proper growth and development of the baby. This is a test which bounces harmless sound waves off the baby so your caregiver can more accurately determine due dates. Sometimes, a test is done on the amniotic fluid surrounding the baby. This test is called an amniocentesis. The amniotic fluid is obtained by sticking a needle into the belly (abdomen). This is done to check the chromosomes in instances where there is a concern about possible genetic problems with the baby. It is also sometimes done near the end of pregnancy if an early delivery is required. In this case, it is done to help make sure the baby's lungs are mature enough for the baby to live outside of the womb. CHANGES OCCURING IN THE SECOND TRIMESTER OF PREGNANCY Your body goes through many changes during pregnancy. They vary from person to person. Talk to your caregiver about changes you notice that you are concerned about.  During the second trimester, you will likely have an increase in your appetite. It is normal to have cravings for certain foods. This varies from person to person and pregnancy to pregnancy.  Your lower abdomen will begin to   bulge.  You may have to urinate more often because the uterus and baby are pressing on your bladder. It is also common to get more bladder infections during pregnancy. You can help this by drinking lots of fluids and emptying your bladder before and after intercourse.  You may begin to get stretch marks on your hips, abdomen, and breasts. These are normal changes in the body during pregnancy. There are no exercises or medicines to take that prevent this change.  You may begin to develop swollen and bulging veins (varicose veins) in your legs.  Wearing support hose, elevating your feet for 15 minutes, 3 to 4 times a day and limiting salt in your diet helps lessen the problem.  Heartburn may develop as the uterus grows and pushes up against the stomach. Antacids recommended by your caregiver helps with this problem. Also, eating smaller meals 4 to 5 times a day helps.  Constipation can be treated with a stool softener or adding bulk to your diet. Drinking lots of fluids, and eating vegetables, fruits, and whole grains are helpful.  Exercising is also helpful. If you have been very active up until your pregnancy, most of these activities can be continued during your pregnancy. If you have been less active, it is helpful to start an exercise program such as walking.  Hemorrhoids may develop at the end of the second trimester. Warm sitz baths and hemorrhoid cream recommended by your caregiver helps hemorrhoid problems.  Backaches may develop during this time of your pregnancy. Avoid heavy lifting, wear low heal shoes, and practice good posture to help with backache problems.  Some pregnant women develop tingling and numbness of their hand and fingers because of swelling and tightening of ligaments in the wrist (carpel tunnel syndrome). This goes away after the baby is born.  As your breasts enlarge, you may have to get a bigger bra. Get a comfortable, cotton, support bra. Do not get a nursing bra until the last month of the pregnancy if you will be nursing the baby.  You may get a dark line from your belly button to the pubic area called the linea nigra.  You may develop rosy cheeks because of increase blood flow to the face.  You may develop spider looking lines of the face, neck, arms, and chest. These go away after the baby is born. HOME CARE INSTRUCTIONS   It is extremely important to avoid all smoking, herbs, alcohol, and unprescribed drugs during your pregnancy. These chemicals affect the formation and growth of the baby. Avoid  these chemicals throughout the pregnancy to ensure the delivery of a healthy infant.  Most of your home care instructions are the same as suggested for the first trimester of your pregnancy. Keep your caregiver's appointments. Follow your caregiver's instructions regarding medicine use, exercise, and diet.  During pregnancy, you are providing food for you and your baby. Continue to eat regular, well-balanced meals. Choose foods such as meat, fish, milk and other low fat dairy products, vegetables, fruits, and whole-grain breads and cereals. Your caregiver will tell you of the ideal weight gain.  A physical sexual relationship may be continued up until near the end of pregnancy if there are no other problems. Problems could include early (premature) leaking of amniotic fluid from the membranes, vaginal bleeding, abdominal pain, or other medical or pregnancy problems.  Exercise regularly if there are no restrictions. Check with your caregiver if you are unsure of the safety of some of your exercises. The   greatest weight gain will occur in the last 2 trimesters of pregnancy. Exercise will help you:  Control your weight.  Get you in shape for labor and delivery.  Lose weight after you have the baby.  Wear a good support or jogging bra for breast tenderness during pregnancy. This may help if worn during sleep. Pads or tissues may be used in the bra if you are leaking colostrum.  Do not use hot tubs, steam rooms or saunas throughout the pregnancy.  Wear your seat belt at all times when driving. This protects you and your baby if you are in an accident.  Avoid raw meat, uncooked cheese, cat litter boxes, and soil used by cats. These carry germs that can cause birth defects in the baby.  The second trimester is also a good time to visit your dentist for your dental health if this has not been done yet. Getting your teeth cleaned is okay. Use a soft toothbrush. Brush gently during pregnancy.  It is  easier to leak urine during pregnancy. Tightening up and strengthening the pelvic muscles will help with this problem. Practice stopping your urination while you are going to the bathroom. These are the same muscles you need to strengthen. It is also the muscles you would use as if you were trying to stop from passing gas. You can practice tightening these muscles up 10 times a set and repeating this about 3 times per day. Once you know what muscles to tighten up, do not perform these exercises during urination. It is more likely to contribute to an infection by backing up the urine.  Ask for help if you have financial, counseling, or nutritional needs during pregnancy. Your caregiver will be able to offer counseling for these needs as well as refer you for other special needs.  Your skin may become oily. If so, wash your face with mild soap, use non-greasy moisturizer and oil or cream based makeup. MEDICINES AND DRUG USE IN PREGNANCY  Take prenatal vitamins as directed. The vitamin should contain 1 milligram of folic acid. Keep all vitamins out of reach of children. Only a couple vitamins or tablets containing iron may be fatal to a baby or young child when ingested.  Avoid use of all medicines, including herbs, over-the-counter medicines, not prescribed or suggested by your caregiver. Only take over-the-counter or prescription medicines for pain, discomfort, or fever as directed by your caregiver. Do not use aspirin.  Let your caregiver also know about herbs you may be using.  Alcohol is related to a number of birth defects. This includes fetal alcohol syndrome. All alcohol, in any form, should be avoided completely. Smoking will cause low birth rate and premature babies.  Street or illegal drugs are very harmful to the baby. They are absolutely forbidden. A baby born to an addicted mother will be addicted at birth. The baby will go through the same withdrawal an adult does. SEEK MEDICAL CARE IF:    You have any concerns or worries during your pregnancy. It is better to call with your questions if you feel they cannot wait, rather than worry about them. SEEK IMMEDIATE MEDICAL CARE IF:   An unexplained oral temperature above 102 F (38.9 C) develops, or as your caregiver suggests.  You have leaking of fluid from the vagina (birth canal). If leaking membranes are suspected, take your temperature and tell your caregiver of this when you call.  There is vaginal spotting, bleeding, or passing clots. Tell your caregiver of   the amount and how many pads are used. Light spotting in pregnancy is common, especially following intercourse.  You develop a bad smelling vaginal discharge with a change in the color from clear to white.  You continue to feel sick to your stomach (nauseated) and have no relief from remedies suggested. You vomit blood or coffee ground-like materials.  You lose more than 2 pounds of weight or gain more than 2 pounds of weight over 1 week, or as suggested by your caregiver.  You notice swelling of your face, hands, feet, or legs.  You get exposed to German measles and have never had them.  You are exposed to fifth disease or chickenpox.  You develop belly (abdominal) pain. Round ligament discomfort is a common non-cancerous (benign) cause of abdominal pain in pregnancy. Your caregiver still must evaluate you.  You develop a bad headache that does not go away.  You develop fever, diarrhea, pain with urination, or shortness of breath.  You develop visual problems, blurry, or double vision.  You fall or are in a car accident or any kind of trauma.  There is mental or physical violence at home. Document Released: 08/05/2001 Document Revised: 05/05/2012 Document Reviewed: 02/07/2009 [employer] Patient Information 2014 [employer].  

## 2013-04-13 LAB — URINE CULTURE: Organism ID, Bacteria: NO GROWTH

## 2013-04-18 ENCOUNTER — Telehealth: Payer: Self-pay | Admitting: *Deleted

## 2013-04-18 NOTE — Telephone Encounter (Signed)
Left message x 1. JSY 

## 2013-04-18 NOTE — Telephone Encounter (Signed)
Spoke with pt. Having swelling in feet. Feels lightheaded, shakey, jittery, and weak. + baby movement. Appt scheduled for tomorrow. Advised if worse, go to ER. Pt voiced understanding. JSY

## 2013-04-19 ENCOUNTER — Ambulatory Visit (INDEPENDENT_AMBULATORY_CARE_PROVIDER_SITE_OTHER): Payer: Medicaid Other | Admitting: Advanced Practice Midwife

## 2013-04-19 VITALS — BP 108/60 | Wt 180.0 lb

## 2013-04-19 DIAGNOSIS — O98519 Other viral diseases complicating pregnancy, unspecified trimester: Secondary | ICD-10-CM

## 2013-04-19 DIAGNOSIS — O34219 Maternal care for unspecified type scar from previous cesarean delivery: Secondary | ICD-10-CM

## 2013-04-19 DIAGNOSIS — Z131 Encounter for screening for diabetes mellitus: Secondary | ICD-10-CM

## 2013-04-19 DIAGNOSIS — Z3480 Encounter for supervision of other normal pregnancy, unspecified trimester: Secondary | ICD-10-CM

## 2013-04-19 DIAGNOSIS — O99019 Anemia complicating pregnancy, unspecified trimester: Secondary | ICD-10-CM

## 2013-04-19 DIAGNOSIS — Z1389 Encounter for screening for other disorder: Secondary | ICD-10-CM

## 2013-04-19 DIAGNOSIS — Z331 Pregnant state, incidental: Secondary | ICD-10-CM

## 2013-04-19 LAB — POCT URINALYSIS DIPSTICK
Glucose, UA: NEGATIVE
Ketones, UA: NEGATIVE
Leukocytes, UA: NEGATIVE
Nitrite, UA: NEGATIVE

## 2013-04-19 LAB — GLUCOSE, POCT (MANUAL RESULT ENTRY): POC Glucose: 88 mg/dl (ref 70–99)

## 2013-04-19 NOTE — Progress Notes (Signed)
[place] today for swelling hands and feet after a 12 hour shift.  Works at [employer].  May use compression stockings/stay hydrated.  Felt jittery during bath, sweaty.  Ate peanut butter crackers/brownie/ ginger ale.   blood sugar today 88.  Sounds like low blood pressure.

## 2013-04-26 ENCOUNTER — Encounter: Payer: Self-pay | Admitting: Obstetrics & Gynecology

## 2013-04-26 ENCOUNTER — Ambulatory Visit (INDEPENDENT_AMBULATORY_CARE_PROVIDER_SITE_OTHER): Payer: Medicaid Other | Admitting: Obstetrics & Gynecology

## 2013-04-26 ENCOUNTER — Other Ambulatory Visit: Payer: Medicaid Other

## 2013-04-26 VITALS — BP 110/70 | Wt 186.0 lb

## 2013-04-26 DIAGNOSIS — Z1389 Encounter for screening for other disorder: Secondary | ICD-10-CM

## 2013-04-26 DIAGNOSIS — O34219 Maternal care for unspecified type scar from previous cesarean delivery: Secondary | ICD-10-CM

## 2013-04-26 DIAGNOSIS — O98519 Other viral diseases complicating pregnancy, unspecified trimester: Secondary | ICD-10-CM

## 2013-04-26 DIAGNOSIS — Z98891 History of uterine scar from previous surgery: Secondary | ICD-10-CM

## 2013-04-26 DIAGNOSIS — Z331 Pregnant state, incidental: Secondary | ICD-10-CM

## 2013-04-26 DIAGNOSIS — O99019 Anemia complicating pregnancy, unspecified trimester: Secondary | ICD-10-CM

## 2013-04-26 DIAGNOSIS — Z3482 Encounter for supervision of other normal pregnancy, second trimester: Secondary | ICD-10-CM

## 2013-04-26 LAB — POCT URINALYSIS DIPSTICK
Blood, UA: NEGATIVE
Glucose, UA: NEGATIVE
Nitrite, UA: NEGATIVE

## 2013-04-26 LAB — CBC
HCT: 30.3 % — ABNORMAL LOW (ref 36.0–46.0)
MCH: 28.6 pg (ref 26.0–34.0)
MCV: 84.2 fL (ref 78.0–100.0)
RDW: 14.9 % (ref 11.5–15.5)
WBC: 7.7 10*3/uL (ref 4.0–10.5)

## 2013-04-26 NOTE — Progress Notes (Signed)
BP weight and urine results all reviewed and noted. Patient reports good fetal movement, denies any bleeding and no rupture of membranes symptoms or regular contractions. Patient is without complaints. All questions were answered.  

## 2013-04-26 NOTE — Addendum Note (Signed)
Addended by: Criss Alvine on: 04/26/2013 09:59 AM   Modules accepted: Orders

## 2013-04-26 NOTE — Patient Instructions (Signed)
Breastfeeding A change in hormones during your pregnancy causes growth of your breast tissue and an increase in number and size of milk ducts. The hormone prolactin allows proteins, sugars, and fats from your blood supply to make breast milk in your milk-producing glands. The hormone progesterone prevents breast milk from being released before the birth of your baby. After the birth of your baby, your progesterone level decreases allowing breast milk to be released. Thoughts of your baby, as well as his or her sucking or crying, can stimulate the release of milk from the milk-producing glands. Deciding to breastfeed (nurse) is one of the best choices you can make for you and your baby. The information that follows gives a brief review of the benefits, as well as other important skills to know about breastfeeding. BENEFITS OF BREASTFEEDING For your baby  The first milk (colostrum) helps your baby's digestive system function better.   There are antibodies in your milk that help your baby fight off infections.   Your baby has a lower incidence of asthma, allergies, and sudden infant death syndrome (SIDS).   The nutrients in breast milk are better for your baby than infant formulas.  Breast milk improves your baby's brain development.   Your baby will have less gas, colic, and constipation.  Your baby is less likely to develop other conditions, such as childhood obesity, asthma, or diabetes mellitus. For you  Breastfeeding helps develop a very special bond between you and your baby.   Breastfeeding is convenient, always available at the correct temperature, and costs nothing.   Breastfeeding helps to burn calories and helps you lose the weight gained during pregnancy.   Breastfeeding makes your uterus contract back down to normal size faster and slows bleeding following delivery.   Breastfeeding mothers have a lower risk of developing osteoporosis or breast or ovarian cancer later  in life.  BREASTFEEDING FREQUENCY  A healthy, full-term baby may breastfeed as often as every hour or space his or her feedings to every 3 hours. Breastfeeding frequency will vary from baby to baby.   Newborns should be fed no less than every 2 3 hours during the day and every 4 5 hours during the night. You should breastfeed a minimum of 8 feedings in a 24 hour period.  Awaken your baby to breastfeed if it has been 3 4 hours since the last feeding.  Breastfeed when you feel the need to reduce the fullness of your breasts or when your newborn shows signs of hunger. Signs that your baby may be hungry include:  Increased alertness or activity.  Stretching.  Movement of the head from side to side.  Movement of the head and opening of the mouth when the corner of the mouth or cheek is stroked (rooting).  Increased sucking sounds, smacking lips, cooing, sighing, or squeaking.  Hand-to-mouth movements.  Increased sucking of fingers or hands.  Fussing.  Intermittent crying.  Signs of extreme hunger will require calming and consoling before you try to feed your baby. Signs of extreme hunger may include:  Restlessness.  A loud, strong cry.  Screaming.  Frequent feeding will help you make more milk and will help prevent problems, such as sore nipples and engorgement of the breasts.  BREASTFEEDING   Whether lying down or sitting, be sure that the baby's abdomen is facing your abdomen.   Support your breast with 4 fingers under your breast and your thumb above your nipple. Make sure your fingers are well away from   your nipple and your baby's mouth.   Stroke your baby's lips gently with your finger or nipple.   When your baby's mouth is open wide enough, place all of your nipple and as much of the colored area around your nipple (areola) as possible into your baby's mouth.  More areola should be visible above his or her upper lip than below his or her lower lip.  Your  baby's tongue should be between his or her lower gum and your breast.  Ensure that your baby's mouth is correctly positioned around the nipple (latched). Your baby's lips should create a seal on your breast.  Signs that your baby has effectively latched onto your nipple include:  Tugging or sucking without pain.  Swallowing heard between sucks.  Absent click or smacking sound.  Muscle movement above and in front of his or her ears with sucking.  Your baby must suck about 2 3 minutes in order to get your milk. Allow your baby to feed on each breast as long as he or she wants. Nurse your baby until he or she unlatches or falls asleep at the first breast, then offer the second breast.  Signs that your baby is full and satisfied include:  A gradual decrease in the number of sucks or complete cessation of sucking.  Falling asleep.  Extension or relaxation of his or her body.  Retention of a small amount of milk in his or her mouth.  Letting go of your breast by himself or herself.  Signs of effective breastfeeding in you include:  Breasts that have increased firmness, weight, and size prior to feeding.  Breasts that are softer after nursing.  Increased milk volume, as well as a change in milk consistency and color by the 5th day of breastfeeding.  Breast fullness relieved by breastfeeding.  Nipples are not sore, cracked, or bleeding.  If needed, break the suction by putting your finger into the corner of your baby's mouth and sliding your finger between his or her gums. Then, remove your breast from his or her mouth.  It is common for babies to spit up a small amount after a feeding.  Babies often swallow air during feeding. This can make babies fussy. Burping your baby between breasts can help with this.  Vitamin D supplements are recommended for babies who get only breast milk.  Avoid using a pacifier during your baby's first 4 6 weeks.  Avoid supplemental feedings of  water, formula, or juice in place of breastfeeding. Breast milk is all the food your baby needs. It is not necessary for your baby to have water or formula. Your breasts will make more milk if supplemental feedings are avoided during the early weeks. HOW TO TELL WHETHER YOUR BABY IS GETTING ENOUGH BREAST MILK Wondering whether or not your baby is getting enough milk is a common concern among mothers. You can be assured that your baby is getting enough milk if:   Your baby is actively sucking and you hear swallowing.   Your baby seems relaxed and satisfied after a feeding.   Your baby nurses at least 8 12 times in a 24 hour time period.  During the first 3 5 days of age:  Your baby is wetting at least 3 5 diapers in a 24 hour period. The urine should be clear and pale yellow.  Your baby is having at least 3 4 stools in a 24 hour period. The stool should be soft and yellow.  At   5 7 days of age, your baby is having at least 3 6 stools in a 24 hour period. The stool should be seedy and yellow by 5 days of age.  Your baby has a weight loss less than 7 10% during the first 3 days of age.  Your baby does not lose weight after 3 7 days of age.  Your baby gains 4 7 ounces each week after he or she is 4 days of age.  Your baby gains weight by 5 days of age and is back to birth weight within 2 weeks. ENGORGEMENT In the first week after your baby is born, you may experience extremely full breasts (engorgement). When engorged, your breasts may feel heavy, warm, or tender to the touch. Engorgement peaks within 24 48 hours after delivery of your baby.  Engorgement may be reduced by:  Continuing to breastfeed.  Increasing the frequency of breastfeeding.  Taking warm showers or applying warm, moist heat to your breasts just before each feeding. This increases circulation and helps the milk flow.   Gently massaging your breast before and during the feedings. With your fingertips, massage from  your chest wall towards your nipple in a circular motion.   Ensuring that your baby empties at least one breast at every feeding. It also helps to start the next feeding on the opposite breast.   Expressing breast milk by hand or by using a breast pump to empty the breasts if your baby is sleepy, or not nursing well. You may also want to express milk if you are returning to work oryou feel you are getting engorged.  Ensuring your baby is latched on and positioned properly while breastfeeding. If you follow these suggestions, your engorgement should improve in 24 48 hours. If you are still experiencing difficulty, call your lactation consultant or caregiver.  CARING FOR YOURSELF Take care of your breasts.  Bathe or shower daily.   Avoid using soap on your nipples.   Wear a supportive bra. Avoid wearing underwire style bras.  Air dry your nipples for a 3 4minutes after each feeding.   Use only cotton bra pads to absorb breast milk leakage. Leaking of breast milk between feedings is normal.   Use only pure lanolin on your nipples after nursing. You do not need to wash it off before feeding your baby again. Another option is to express a few drops of breast milk and gently massage that milk into your nipples.  Continue breast self-awareness checks. Take care of yourself.  Eat healthy foods. Alternate 3 meals with 3 snacks.  Avoid foods that you notice affect your baby in a bad way.  Drink milk, fruit juice, and water to satisfy your thirst (about 8 glasses a day).   Rest often, relax, and take your prenatal vitamins to prevent fatigue, stress, and anemia.  Avoid chewing and smoking tobacco.  Avoid alcohol and drug use.  Take over-the-counter and prescribed medicine only as directed by your caregiver or pharmacist. You should always check with your caregiver or pharmacist before taking any new medicine, vitamin, or herbal supplement.  Know that pregnancy is possible while  breastfeeding. If desired, talk to your caregiver about family planning and safe birth control methods that may be used while breastfeeding. SEEK MEDICAL CARE IF:   You feel like you want to stop breastfeeding or have become frustrated with breastfeeding.  You have painful breasts or nipples.  Your nipples are cracked or bleeding.  Your breasts are red, tender,   or warm.  You have a swollen area on either breast.  You have a fever or chills.  You have nausea or vomiting.  You have drainage from your nipples.  Your breasts do not become full before feedings by the 5th day after delivery.  You feel sad and depressed.  Your baby is too sleepy to eat well.  Your baby is having trouble sleeping.   Your baby is wetting less than 3 diapers in a 24 hour period.  Your baby has less than 3 stools in a 24 hour period.  Your baby's skin or the white part of his or her eyes becomes more yellow.   Your baby is not gaining weight by 5 days of age. MAKE SURE YOU:   Understand these instructions.  Will watch your condition.  Will get help right away if you are not doing well or get worse. Document Released: 08/11/2005 Document Revised: 05/05/2012 Document Reviewed: 03/17/2012 [employer] Patient Information 2014 [employer].  

## 2013-04-26 NOTE — Progress Notes (Signed)
For PN2 Today.

## 2013-04-27 LAB — GLUCOSE TOLERANCE, 2 HOURS W/ 1HR: Glucose, Fasting: 72 mg/dL (ref 70–99)

## 2013-04-27 LAB — HSV 2 ANTIBODY, IGG: HSV 2 Glycoprotein G Ab, IgG: 8.87 IV — ABNORMAL HIGH

## 2013-05-04 ENCOUNTER — Ambulatory Visit (INDEPENDENT_AMBULATORY_CARE_PROVIDER_SITE_OTHER): Payer: Medicaid Other | Admitting: Obstetrics & Gynecology

## 2013-05-04 ENCOUNTER — Encounter: Payer: Self-pay | Admitting: Obstetrics & Gynecology

## 2013-05-04 ENCOUNTER — Telehealth: Payer: Self-pay | Admitting: Obstetrics & Gynecology

## 2013-05-04 VITALS — BP 100/60 | Wt 183.0 lb

## 2013-05-04 DIAGNOSIS — Z1389 Encounter for screening for other disorder: Secondary | ICD-10-CM

## 2013-05-04 DIAGNOSIS — Z331 Pregnant state, incidental: Secondary | ICD-10-CM

## 2013-05-04 DIAGNOSIS — N949 Unspecified condition associated with female genital organs and menstrual cycle: Secondary | ICD-10-CM | POA: Insufficient documentation

## 2013-05-04 DIAGNOSIS — O9989 Other specified diseases and conditions complicating pregnancy, childbirth and the puerperium: Secondary | ICD-10-CM

## 2013-05-04 LAB — POCT URINALYSIS DIPSTICK
Glucose, UA: NEGATIVE
Ketones, UA: NEGATIVE
Leukocytes, UA: NEGATIVE

## 2013-05-04 NOTE — Progress Notes (Signed)
C/C Having period cramps,back pain. Clear vaginal discharge.

## 2013-05-04 NOTE — Telephone Encounter (Signed)
Spoke with pt. [redacted] weeks pregnant. Having period cramps, backpain, clear vaginal discharge. No bleeding. + baby movement. Pt to come in and be worked in. [employer]

## 2013-05-04 NOTE — Progress Notes (Signed)
Patient having 4 or 5 episodes for the last 3 days of unilateral or bilateral lower pelvic sharp pain.  No bleeding no gushes of fluid no urinary symptoms Exam No cvat cervix LTC firm Imp: Round ligament pain No follow up needed

## 2013-05-04 NOTE — Patient Instructions (Addendum)

## 2013-05-05 ENCOUNTER — Encounter: Payer: Medicaid Other | Admitting: Obstetrics & Gynecology

## 2013-05-24 ENCOUNTER — Encounter: Payer: Self-pay | Admitting: Women's Health

## 2013-05-24 ENCOUNTER — Ambulatory Visit (INDEPENDENT_AMBULATORY_CARE_PROVIDER_SITE_OTHER): Payer: Medicaid Other | Admitting: Women's Health

## 2013-05-24 VITALS — BP 120/80 | Wt 188.0 lb

## 2013-05-24 DIAGNOSIS — O99019 Anemia complicating pregnancy, unspecified trimester: Secondary | ICD-10-CM

## 2013-05-24 DIAGNOSIS — O34219 Maternal care for unspecified type scar from previous cesarean delivery: Secondary | ICD-10-CM

## 2013-05-24 DIAGNOSIS — Z3483 Encounter for supervision of other normal pregnancy, third trimester: Secondary | ICD-10-CM

## 2013-05-24 DIAGNOSIS — Z1389 Encounter for screening for other disorder: Secondary | ICD-10-CM

## 2013-05-24 DIAGNOSIS — Z98891 History of uterine scar from previous surgery: Secondary | ICD-10-CM

## 2013-05-24 DIAGNOSIS — Z331 Pregnant state, incidental: Secondary | ICD-10-CM

## 2013-05-24 LAB — POCT URINALYSIS DIPSTICK
Ketones, UA: NEGATIVE
Leukocytes, UA: NEGATIVE
Nitrite, UA: NEGATIVE
Protein, UA: NEGATIVE

## 2013-05-24 NOTE — Patient Instructions (Signed)
Heartburn During Pregnancy   Heartburn is a burning sensation in the chest caused by stomach acid backing up into the esophagus. Heartburn (also known as "reflux") is common in pregnancy because a certain hormone (progesterone) changes. The progesterone hormone may relax the valve that separates the esophagus from the stomach. This allows acid to go up into the esophagus, causing heartburn. Heartburn may also happen in pregnancy because the enlarging uterus pushes up on the stomach, which pushes more acid into the esophagus. This is especially true in the later stages of pregnancy. Heartburn problems usually go away after giving birth.  CAUSES   · The progesterone hormone.  · Changing hormone levels.  · The growing uterus that pushes stomach acid upward.  · Large meals.  · Certain foods and drinks.  · Exercise.  · Increased acid production.  SYMPTOMS   · Burning pain in the chest or lower throat.  · Bitter taste in the mouth.  · Coughing.  DIAGNOSIS   Heartburn is typically diagnosed by your caregiver when taking a careful history of your concern. Your caregiver may order a blood test to check for a certain type of bacteria that is associated with heartburn. Sometimes, heartburn is diagnosed by prescribing a heartburn medicine to see if the symptoms improve. It is rare in pregnancy to have a procedure called an endoscopy. This is when a tube with a light and a camera on the end is used to examine the esophagus and the stomach.  TREATMENT   · Your caregiver may tell you to use certain over-the-counter medicines (antacids, acid reducers) for mild heartburn.  · Your caregiver may prescribe medicines to decrease stomach acid or to protect your stomach lining.  · Your caregiver may recommend certain diet changes.  · For severe cases, your caregiver may recommend that the head of the bed be elevated on blocks. (Sleeping with more pillows is not an effective treatment as it only changes the position of your head and does  not improve the main problem of stomach acid refluxing into the esophagus.)  HOME CARE INSTRUCTIONS   · Take all medicines as directed by your caregiver.  · Raise the head of your bed by putting blocks under the legs if instructed to by your caregiver.  · Do not exercise right after eating.  · Avoid eating 2 or 3 hours before bed. Do not lie down right after eating.  · Eat small meals throughout the day instead of 3 large meals.  · Identify foods and beverages that make your symptoms worse and avoid them. Foods you may want to avoid include:  · Peppers.  · Chocolate.  · High-fat foods, including fried foods.  · Spicy foods.  · Garlic and onions.  · Citrus fruits, including oranges, grapefruit, lemons, and limes.  · Food containing tomatoes or tomato products.  · Mint.  · Carbonated and caffeinated drinks.  · Vinegar.  SEEK IMMEDIATE MEDICAL CARE IF:   · You have severe chest pain that goes down your arm or into your jaw or neck.  · You feel sweaty, dizzy, or lightheaded.  · You become short of breath.  · You vomit blood.  · You have difficulty or pain with swallowing.  · You have bloody or black, tarry stools.  · You have episodes of heartburn more than 3 times a week, for more than 2 weeks.  MAKE SURE YOU:  · Understand these instructions.  · Will watch your condition.  · Will get 

## 2013-05-24 NOTE — Progress Notes (Signed)
Pt here today for routine visit. Pt states she has had some pressure and some swelling in her hands and feet. Pt denies any other problems or concerns at this time.

## 2013-05-24 NOTE — Progress Notes (Signed)
Reports good fm. Denies uc's, lof, vb, urinary frequency, urgency, hesitancy, or dysuria.  Heartburn.  Reviewed heartburn prevention/relief measures, pn2 results, ptl s/s, fkc.  All questions answered. F/U in 2wks for visit.

## 2013-06-07 ENCOUNTER — Ambulatory Visit (INDEPENDENT_AMBULATORY_CARE_PROVIDER_SITE_OTHER): Payer: Medicaid Other | Admitting: Women's Health

## 2013-06-07 VITALS — BP 110/70 | Wt 195.8 lb

## 2013-06-07 DIAGNOSIS — Z1389 Encounter for screening for other disorder: Secondary | ICD-10-CM

## 2013-06-07 DIAGNOSIS — Z331 Pregnant state, incidental: Secondary | ICD-10-CM

## 2013-06-07 DIAGNOSIS — O99019 Anemia complicating pregnancy, unspecified trimester: Secondary | ICD-10-CM

## 2013-06-07 DIAGNOSIS — O34219 Maternal care for unspecified type scar from previous cesarean delivery: Secondary | ICD-10-CM

## 2013-06-07 DIAGNOSIS — O98519 Other viral diseases complicating pregnancy, unspecified trimester: Secondary | ICD-10-CM

## 2013-06-07 DIAGNOSIS — Z3483 Encounter for supervision of other normal pregnancy, third trimester: Secondary | ICD-10-CM

## 2013-06-07 LAB — POCT URINALYSIS DIPSTICK
Leukocytes, UA: NEGATIVE
Nitrite, UA: NEGATIVE

## 2013-06-07 NOTE — Progress Notes (Signed)
Reports good fm. Denies uc's, lof, vb, urinary frequency, urgency, hesitancy, or dysuria.  Occ sharp upper abd, not epigastric or RUQ, pains- sounds like r/t fm.  LBP- recommended pregnancy belt, especially when working. Reviewed ptl, fkc.  All questions answered. F/U in 2wks for visit.

## 2013-06-07 NOTE — Progress Notes (Signed)
C/o upper abdominal pain extends to back.

## 2013-06-07 NOTE — Patient Instructions (Signed)

## 2013-06-10 ENCOUNTER — Emergency Department (HOSPITAL_COMMUNITY)
Admission: EM | Admit: 2013-06-10 | Discharge: 2013-06-10 | Disposition: A | Discharge status: Home / Self Care | Payer: Medicaid Other | Attending: Emergency Medicine | Admitting: Emergency Medicine

## 2013-06-10 ENCOUNTER — Encounter (HOSPITAL_COMMUNITY): Payer: Self-pay | Admitting: Emergency Medicine

## 2013-06-10 DIAGNOSIS — O26899 Other specified pregnancy related conditions, unspecified trimester: Secondary | ICD-10-CM

## 2013-06-10 DIAGNOSIS — Z8709 Personal history of other diseases of the respiratory system: Secondary | ICD-10-CM | POA: Insufficient documentation

## 2013-06-10 DIAGNOSIS — Z91040 Latex allergy status: Secondary | ICD-10-CM | POA: Insufficient documentation

## 2013-06-10 DIAGNOSIS — R1031 Right lower quadrant pain: Secondary | ICD-10-CM | POA: Insufficient documentation

## 2013-06-10 DIAGNOSIS — Z87891 Personal history of nicotine dependence: Secondary | ICD-10-CM | POA: Insufficient documentation

## 2013-06-10 DIAGNOSIS — O9989 Other specified diseases and conditions complicating pregnancy, childbirth and the puerperium: Secondary | ICD-10-CM | POA: Insufficient documentation

## 2013-06-10 DIAGNOSIS — Z8619 Personal history of other infectious and parasitic diseases: Secondary | ICD-10-CM | POA: Insufficient documentation

## 2013-06-10 DIAGNOSIS — M549 Dorsalgia, unspecified: Secondary | ICD-10-CM | POA: Insufficient documentation

## 2013-06-10 HISTORY — DX: Encounter for supervision of normal pregnancy, unspecified, unspecified trimester: Z34.90

## 2013-06-10 LAB — URINALYSIS, ROUTINE W REFLEX MICROSCOPIC
Bilirubin Urine: NEGATIVE
Ketones, ur: NEGATIVE mg/dL
Nitrite: NEGATIVE
Protein, ur: NEGATIVE mg/dL
Specific Gravity, Urine: 1.02 (ref 1.005–1.030)
Urobilinogen, UA: 0.2 mg/dL (ref 0.0–1.0)

## 2013-06-10 LAB — CBC WITH DIFFERENTIAL/PLATELET
Basophils Relative: 0 % (ref 0–1)
Eosinophils Absolute: 0.4 10*3/uL (ref 0.0–0.7)
Eosinophils Relative: 5 % (ref 0–5)
MCH: 28.9 pg (ref 26.0–34.0)
MCHC: 33.9 g/dL (ref 30.0–36.0)
MCV: 85.2 fL (ref 78.0–100.0)
Neutrophils Relative %: 66 % (ref 43–77)
Platelets: 195 10*3/uL (ref 150–400)
RDW: 13 % (ref 11.5–15.5)

## 2013-06-10 LAB — COMPREHENSIVE METABOLIC PANEL
Albumin: 2.7 g/dL — ABNORMAL LOW (ref 3.5–5.2)
BUN: 9 mg/dL (ref 6–23)
Creatinine, Ser: 0.53 mg/dL (ref 0.50–1.10)
Potassium: 3.3 mEq/L — ABNORMAL LOW (ref 3.5–5.1)
Total Protein: 6.6 g/dL (ref 6.0–8.3)

## 2013-06-10 LAB — LIPASE, BLOOD: Lipase: 20 U/L (ref 11–59)

## 2013-06-10 NOTE — ED Provider Notes (Signed)
CSN: [id number]     Arrival date & time 06/10/13  4098 History   First MD Initiated Contact with Patient 06/10/13 1946     This chart was scribed for Gerhard Munch, MD by Manuela Schwartz, ED scribe. This patient was seen in room APA01/APA01 and the patient's care was started at 1852.  Chief Complaint  Patient presents with  . Abdominal Pain   The history is provided by the patient. No language interpreter was used.   HPI Comments: Kaylee Miller is a 22 y.o. female who presents to the Emergency Department [redacted] weeks pregnant complaining of mucous in her urine today and right mid back/abdominal pain for 1 week. She states what caused her to come to ED tonight was the "thick mucous in my urine". She denies any vaginal bleeding. She states pregnancy has been without any complications. She states her bilateral feet are always swollen. She states saw her OB/GYN for this right sided back/abdominal pain for 1 week and was told it might be GERD. She states this pain has worsened today. She denies fever/chills, SOB, CP, rash.  She denies medical hx.   Past Medical History  Diagnosis Date  . Seasonal allergies   . Headache(784.0)     migraines  . HSV-2 (herpes simplex virus 2) infection   . History of bronchitis   . Pregnant    Past Surgical History  Procedure Laterality Date  . Adenoids    . Cesarean section  2007  . Tubes in ears     Family History  Problem Relation Age of Onset  . Diabetes Other   . CAD Other   . Cancer Mother   . Hypertension Father   . CAD Maternal Grandfather   . CAD Paternal Grandmother   . CAD Paternal Grandfather    History  Substance Use Topics  . Smoking status: Former Smoker -- 1.00 packs/day for 6 years    Types: Cigarettes  . Smokeless tobacco: Never Used  . Alcohol Use: No     Comment: OCC   OB History   Grav Para Term Preterm Abortions TAB SAB Ect Mult Living   2 1 1       1      Review of Systems  Constitutional: Negative for fever and  chills.  Respiratory: Negative for shortness of breath.   Gastrointestinal: Positive for abdominal pain (right sided abdominal/back pain). Negative for nausea and vomiting.  Genitourinary:       "mucous in my urine"  Musculoskeletal: Positive for back pain.  Neurological: Negative for weakness.  All other systems reviewed and are negative.  A complete 10 system review of systems was obtained and all systems are negative except as noted in the HPI and PMH.    Allergies  Latex  Home Medications   Current Outpatient Rx  Name  Route  Sig  Dispense  Refill  . diphenhydrAMINE (BENADRYL) 25 MG tablet   Oral   Take 25 mg by mouth every 6 (six) hours as needed for allergies.          . Prenatal Vit-Fe Fumarate-FA (PRENATAL MULTIVITAMIN) TABS tablet   Oral   Take 1 tablet by mouth daily at 12 noon.         Marland Kitchen acetaminophen (TYLENOL) 325 MG tablet   Oral   Take 650 mg by mouth as needed for pain.          Triage Vitals: BP 125/97  Pulse 86  Temp(Src) 98.6 F (37 C) (  Oral)  Resp 20  Ht 5\' 7"  (1.702 m)  Wt 195 lb (88.451 kg)  BMI 30.53 kg/m2  SpO2 100%  LMP 10/31/2012 Physical Exam  Nursing note and vitals reviewed. Constitutional: She is oriented to person, place, and time. She appears well-developed and well-nourished. No distress.  HENT:  Head: Normocephalic and atraumatic.  Eyes: EOM are normal.  Neck: Neck supple. No tracheal deviation present.  Cardiovascular: Normal rate, regular rhythm and normal heart sounds.   No murmur heard. Pulmonary/Chest: Effort normal and breath sounds normal. No respiratory distress. She has no wheezes. She has no rales.  Abdominal: Soft. Bowel sounds are normal. There is no tenderness.  Non tender gravid abdomen FHR 150 bpm  Musculoskeletal: Normal range of motion.  Neurological: She is alert and oriented to person, place, and time.  Skin: Skin is warm and dry.  Psychiatric: She has a normal mood and affect. Her behavior is normal.     ED Course  Procedures (including critical care time) DIAGNOSTIC STUDIES: Oxygen Saturation is 100% on room air, normal by my interpretation.    COORDINATION OF CARE: At 800 PM Discussed treatment plan with patient which includes UA, blood work. Patient agrees.   Labs Review Labs Reviewed  URINALYSIS, ROUTINE W REFLEX MICROSCOPIC   Imaging Review No results found.  EKG Interpretation   None      9:44 PM On repeat exam the patient appears in no distress.  I discussed all findings with the patient and her mother. In addition, we have checked with our obstetrics rapid response team, and there is no evidence for contractions on the monitor. I spoke with the patient's obstetrician, who will follow up with the patient.  MDM  No diagnosis found.  I personally performed the services described in this documentation, which was scribed in my presence. The recorded information has been reviewed and is accurate.   This G2 P79 female presents with ongoing right lateral chest wall pain, urinary changes.  On exam she is awake alert, afebrile, in no distress.  Monitoring demonstrates no abnormal fetal activity, and throughout the patient's emergency room course she was in no distress.  Though the etiology of her pain remains unclear, renal colic, hepatobiliary colic are considerations versus musculoskeletal pain.  She is appropriate for discharge with close obstetrics followup.    Gerhard Munch, MD 06/10/13 2145

## 2013-06-10 NOTE — ED Notes (Signed)
Spoke with Victorino Dike, rapid response nurse at [facility], regarding patient. Informed her that patient was placed on fetal monitor. Victorino Dike stated would begin monitoring patient.

## 2013-06-10 NOTE — ED Notes (Signed)
Rt abd pain, [redacted] weeks pregnant.  Pt saw mucus when urinated today.

## 2013-06-10 NOTE — Progress Notes (Addendum)
Pt presents with RLQ abd pain x 1 week radiating to her back. Worse today, has not been seen for this. Pt is a [facility] pt, prev C/S. ED RN states that pt doesn't appear to be having contractions or in labor Call from Dr Emelda Fear, reviewed tracing, plan to D/C home

## 2013-06-13 ENCOUNTER — Ambulatory Visit (INDEPENDENT_AMBULATORY_CARE_PROVIDER_SITE_OTHER): Payer: Medicaid Other | Admitting: Obstetrics and Gynecology

## 2013-06-13 ENCOUNTER — Encounter (INDEPENDENT_AMBULATORY_CARE_PROVIDER_SITE_OTHER): Payer: Self-pay

## 2013-06-13 VITALS — BP 100/48 | Wt 193.6 lb

## 2013-06-13 DIAGNOSIS — R1011 Right upper quadrant pain: Secondary | ICD-10-CM

## 2013-06-13 DIAGNOSIS — O98519 Other viral diseases complicating pregnancy, unspecified trimester: Secondary | ICD-10-CM

## 2013-06-13 DIAGNOSIS — O99019 Anemia complicating pregnancy, unspecified trimester: Secondary | ICD-10-CM

## 2013-06-13 DIAGNOSIS — Z1389 Encounter for screening for other disorder: Secondary | ICD-10-CM

## 2013-06-13 DIAGNOSIS — Z331 Pregnant state, incidental: Secondary | ICD-10-CM

## 2013-06-13 DIAGNOSIS — O34219 Maternal care for unspecified type scar from previous cesarean delivery: Secondary | ICD-10-CM

## 2013-06-13 LAB — POCT URINALYSIS DIPSTICK
Glucose, UA: NEGATIVE
Nitrite, UA: NEGATIVE

## 2013-06-13 NOTE — Progress Notes (Signed)
Follow up [facility] ER for abdominal pain, c/o "dizziness past few days"

## 2013-06-13 NOTE — Progress Notes (Signed)
ER f/u for dizziness, off & on , began Saturday at b'day party, left work due to dizziness. Pin in RUQ. pex  ?+ gallbladder discomfort vs musculoskeletal c/o  A : r/o gb disease. P GB u/s

## 2013-06-14 ENCOUNTER — Encounter: Payer: Medicaid Other | Admitting: Obstetrics and Gynecology

## 2013-06-16 ENCOUNTER — Ambulatory Visit (HOSPITAL_COMMUNITY)
Admission: RE | Admit: 2013-06-16 | Discharge: 2013-06-16 | Disposition: A | Discharge status: Home / Self Care | Payer: Medicaid Other | Source: Ambulatory Visit | Attending: Obstetrics and Gynecology | Admitting: Obstetrics and Gynecology

## 2013-06-16 DIAGNOSIS — R1011 Right upper quadrant pain: Secondary | ICD-10-CM | POA: Insufficient documentation

## 2013-06-21 ENCOUNTER — Encounter: Payer: Medicaid Other | Admitting: Women's Health

## 2013-06-27 ENCOUNTER — Encounter: Payer: Self-pay | Admitting: Women's Health

## 2013-06-27 ENCOUNTER — Ambulatory Visit (INDEPENDENT_AMBULATORY_CARE_PROVIDER_SITE_OTHER): Payer: Medicaid Other | Admitting: Women's Health

## 2013-06-27 VITALS — BP 120/70 | Wt 196.0 lb

## 2013-06-27 DIAGNOSIS — Z1389 Encounter for screening for other disorder: Secondary | ICD-10-CM

## 2013-06-27 DIAGNOSIS — O1213 Gestational proteinuria, third trimester: Secondary | ICD-10-CM

## 2013-06-27 DIAGNOSIS — O99013 Anemia complicating pregnancy, third trimester: Secondary | ICD-10-CM

## 2013-06-27 DIAGNOSIS — O2390 Unspecified genitourinary tract infection in pregnancy, unspecified trimester: Secondary | ICD-10-CM

## 2013-06-27 DIAGNOSIS — R102 Pelvic and perineal pain: Secondary | ICD-10-CM

## 2013-06-27 DIAGNOSIS — O98519 Other viral diseases complicating pregnancy, unspecified trimester: Secondary | ICD-10-CM

## 2013-06-27 DIAGNOSIS — O34219 Maternal care for unspecified type scar from previous cesarean delivery: Secondary | ICD-10-CM

## 2013-06-27 DIAGNOSIS — B009 Herpesviral infection, unspecified: Secondary | ICD-10-CM

## 2013-06-27 DIAGNOSIS — R768 Other specified abnormal immunological findings in serum: Secondary | ICD-10-CM

## 2013-06-27 DIAGNOSIS — O99019 Anemia complicating pregnancy, unspecified trimester: Secondary | ICD-10-CM

## 2013-06-27 DIAGNOSIS — Z3483 Encounter for supervision of other normal pregnancy, third trimester: Secondary | ICD-10-CM

## 2013-06-27 DIAGNOSIS — Z331 Pregnant state, incidental: Secondary | ICD-10-CM

## 2013-06-27 LAB — POCT URINALYSIS DIPSTICK
Blood, UA: NEGATIVE
Glucose, UA: NEGATIVE
Ketones, UA: NEGATIVE
Nitrite, UA: NEGATIVE

## 2013-06-27 LAB — POCT WET PREP (WET MOUNT): Clue Cells Wet Prep Whiff POC: NEGATIVE

## 2013-06-27 MED ORDER — ACYCLOVIR 400 MG PO TABS: 400.0000 mg | ORAL_TABLET | Freq: Three times a day (TID) | ORAL | Status: DC

## 2013-06-27 MED ORDER — FERROUS SULFATE 325 (65 FE) MG PO TABS: 325.0000 mg | ORAL_TABLET | Freq: Two times a day (BID) | ORAL | Status: DC

## 2013-06-27 NOTE — Patient Instructions (Signed)

## 2013-06-27 NOTE — Progress Notes (Signed)
Reports good fm. Denies lof, vb, urinary frequency, urgency, hesitancy, or dysuria.  States she voids large amounts and has strong urine odor. Denies fever/chills. Lots of cramps last night, lots of constant mid/low back pain. Does have pregnancy belt that she wears while at work. Recommended wearing pregnancy belt daily, even when not at work, apap prn, heating pad to back no longer than at a time, warm baths, if not improving some w/ these measures to call and let us know.  No CVAT. Will send urine for cx. Spec exam: cx visually closed, mod amount thick white d/c, no odor. SVE: 1/th/-2, vtx. Wet prep neg. Reviewed ptl s/s, fkc.  All questions answered. Rx acyclovir for h/o HSV2. Rx ferrous sulfate bid and discussed fe rich foods. F/U in 2wks for visit.

## 2013-06-28 LAB — URINE CULTURE: Colony Count: >=100000

## 2013-07-02 ENCOUNTER — Inpatient Hospital Stay (HOSPITAL_COMMUNITY)
Admission: AD | Admit: 2013-07-02 | Discharge: 2013-07-02 | Disposition: A | Discharge status: Home / Self Care | Payer: Medicaid Other | Source: Ambulatory Visit | Attending: Family Medicine | Admitting: Family Medicine

## 2013-07-02 ENCOUNTER — Encounter (HOSPITAL_COMMUNITY): Payer: Self-pay | Admitting: *Deleted

## 2013-07-02 DIAGNOSIS — O479 False labor, unspecified: Secondary | ICD-10-CM

## 2013-07-02 DIAGNOSIS — A5901 Trichomonal vulvovaginitis: Secondary | ICD-10-CM | POA: Insufficient documentation

## 2013-07-02 DIAGNOSIS — Z3482 Encounter for supervision of other normal pregnancy, second trimester: Secondary | ICD-10-CM

## 2013-07-02 DIAGNOSIS — Z87891 Personal history of nicotine dependence: Secondary | ICD-10-CM | POA: Insufficient documentation

## 2013-07-02 DIAGNOSIS — O98819 Other maternal infectious and parasitic diseases complicating pregnancy, unspecified trimester: Secondary | ICD-10-CM | POA: Insufficient documentation

## 2013-07-02 DIAGNOSIS — O9989 Other specified diseases and conditions complicating pregnancy, childbirth and the puerperium: Secondary | ICD-10-CM

## 2013-07-02 DIAGNOSIS — N898 Other specified noninflammatory disorders of vagina: Secondary | ICD-10-CM

## 2013-07-02 DIAGNOSIS — O2390 Unspecified genitourinary tract infection in pregnancy, unspecified trimester: Secondary | ICD-10-CM

## 2013-07-02 DIAGNOSIS — M549 Dorsalgia, unspecified: Secondary | ICD-10-CM | POA: Insufficient documentation

## 2013-07-02 DIAGNOSIS — O26893 Other specified pregnancy related conditions, third trimester: Secondary | ICD-10-CM

## 2013-07-02 DIAGNOSIS — O99891 Other specified diseases and conditions complicating pregnancy: Secondary | ICD-10-CM | POA: Insufficient documentation

## 2013-07-02 LAB — URINALYSIS, ROUTINE W REFLEX MICROSCOPIC
Bilirubin Urine: NEGATIVE
Hgb urine dipstick: NEGATIVE
Ketones, ur: NEGATIVE mg/dL
Nitrite: NEGATIVE
pH: 7.5 (ref 5.0–8.0)

## 2013-07-02 LAB — URINE MICROSCOPIC-ADD ON

## 2013-07-02 MED ORDER — METRONIDAZOLE 500 MG PO TABS
2000.0000 mg | ORAL_TABLET | Freq: Once | ORAL | Status: AC
  Administered 2013-07-02: 2000 mg via ORAL
  Filled 2013-07-02: qty 4

## 2013-07-02 NOTE — MAU Provider Note (Signed)
History     CSN: [id number]  Arrival date and time: 07/02/13 1012   None     Chief Complaint  Patient presents with  . Rupture of Membranes   HPI Pt is a 22 y.o G2P1001 who presents at 35w2 for concerns of ROM. Was lying in bed this morning on the computer when pt felt that she urinated on herself. She ambulated to toilet to use the bathroom and noted long stringy mucus coming from the vagina. No blood. Had normal BM. But still felt wet and increased urgency and frequency. Feels increased pelvic pressure as baby's head pressing on bladder. Also with constant back pain but no consistent contraction pattern. No vaginal discharge apart from mucus plug and no pruritis. Had nml wet prep 11/3.  [facility] at [facility]. Normal anatomy. Nml 2 hr glucose. Prev CS for failure to progress. (anterior lip and OP presentation)  Breast and bottle feeding preference. Desires depo for contraception. Has hx of HSV given rx for acyclovir 400mg  TID for suppression. Started last week 100% compliance.  OB History   Grav Para Term Preterm Abortions TAB SAB Ect Mult Living   2 1 1       1       Past Medical History  Diagnosis Date  . Seasonal allergies   . Headache(784.0)     migraines  . HSV-2 (herpes simplex virus 2) infection   . History of bronchitis   . Pregnant     Past Surgical History  Procedure Laterality Date  . Adenoids    . Cesarean section  2007  . Tubes in ears      Family History  Problem Relation Age of Onset  . Diabetes Other   . CAD Other   . Cancer Mother   . Hypertension Father   . CAD Maternal Grandfather   . CAD Paternal Grandmother   . CAD Paternal Grandfather     History  Substance Use Topics  . Smoking status: Former Smoker -- 1.00 packs/day for 6 years    Types: Cigarettes  . Smokeless tobacco: Never Used  . Alcohol Use: No     Comment: OCC    Allergies:  Allergies  Allergen Reactions  . Latex Swelling    Prescriptions prior to admission  Medication Sig  Dispense Refill  . acetaminophen (TYLENOL) 325 MG tablet Take 650 mg by mouth as needed for pain.      Marland Kitchen acyclovir (ZOVIRAX) 400 MG tablet Take 1 tablet (400 mg total) by mouth 3 (three) times daily.  90 tablet  3  . Prenatal Vit-Fe Fumarate-FA (PRENATAL MULTIVITAMIN) TABS tablet Take 1 tablet by mouth daily at 12 noon.        Review of Systems  Constitutional: Negative for fever and chills.  HENT: Negative for congestion and sore throat.   Eyes: Negative for blurred vision.  Respiratory: Negative for cough, shortness of breath and wheezing.   Cardiovascular: Negative for chest pain, palpitations and leg swelling.  Gastrointestinal: Positive for abdominal pain. Negative for heartburn, nausea, vomiting, diarrhea, constipation, blood in stool and melena.  Genitourinary: Positive for dysuria, urgency and frequency. Negative for hematuria and flank pain.  Musculoskeletal: Positive for back pain and myalgias. Negative for falls.  Skin: Negative for rash.  Neurological: Negative for dizziness, seizures, loss of consciousness, weakness and headaches.  Endo/Heme/Allergies: Does not bruise/bleed easily.  Psychiatric/Behavioral: Negative for depression.   Physical Exam   Blood pressure 132/69, pulse 101, temperature 98.2 F (36.8 C), temperature source Oral,  resp. rate 18, height 5\' 7"  (1.702 m), weight 88.905 kg (196 lb), last menstrual period 10/31/2012.  Physical Exam  Constitutional: She is oriented to person, place, and time. She appears well-developed and well-nourished. No distress.  HENT:  Head: Normocephalic and atraumatic.  Eyes: EOM are normal.  Neck: Neck supple. No thyromegaly present.  Cardiovascular: Normal rate and regular rhythm.   No murmur heard. Respiratory: Effort normal and breath sounds normal. No respiratory distress.  GI: Soft. Bowel sounds are normal. There is tenderness. There is no rebound and no guarding.  Gravid, suprapubic tenderness on palpation   Musculoskeletal: Normal range of motion. She exhibits no edema.  Sacral and paraspinous tenderness in lumbar region on palpation, no CVA tendernes  Neurological: She is alert and oriented to person, place, and time. She has normal reflexes. She exhibits normal muscle tone.  Skin: Skin is warm and dry. No erythema.  Psychiatric: She has a normal mood and affect. Her behavior is normal.    MAU Course  Procedures  MDM FHR- baseline 145, mod var, post accels, x1 variable UC-irritability U/A- +trich/bacteria Fern- neg GC/CT- pending  Assessment and Plan  Ms Kovich is a 22 y.o G2P1 who presents at 76w2 with concerns for ROM  1. R/o ROM- pt history consistent with passage of mucus plug. No persistent fluid leakage and fern neg. UC with irritability but no consistent pattern. FHR reassuring. U/A with many bacteria and sml leuks + trich. -will tx with 2gm flagyl -pt given labor precautions -rtc if feels another gush of fluid with persistent leaking -GCCT pending -encouraged to increase hydration  2. TOLAC- signed papers, 1st pregnancy IOL sectioned for failure to progress.   3. FHR- reassuring -cat I tracing  Anselm Lis 07/02/2013, 11:03 AM   I have seen and examined this patient and agree with above documentation in the resident's note. Pt presented for passage of muicous and vaginal discharge. Cervix unchanged from last week. Neg pool and fern.  UA + for trich. GC/chl sent. FWB- cat I tracing.  F/u as scheduled in clinic.     Rulon Abide, M.D. [employer] [facility] [employer] Fellow 07/02/2013 2:41 PM

## 2013-07-02 NOTE — MAU Note (Signed)
Patient first noticed leakage this morning; also lost mucus plug this morning; reports no leakage at this time.

## 2013-07-02 NOTE — MAU Note (Signed)
Specimen obtained for fern test.

## 2013-07-02 NOTE — MAU Note (Signed)
Patient presents with complaint of leaking fluid.

## 2013-07-03 LAB — URINE CULTURE

## 2013-07-04 LAB — GC/CHLAMYDIA PROBE AMP
CT Probe RNA: NEGATIVE
GC Probe RNA: NEGATIVE

## 2013-07-11 ENCOUNTER — Ambulatory Visit (INDEPENDENT_AMBULATORY_CARE_PROVIDER_SITE_OTHER): Payer: Medicaid Other | Admitting: Women's Health

## 2013-07-11 ENCOUNTER — Encounter: Payer: Self-pay | Admitting: Women's Health

## 2013-07-11 VITALS — BP 100/62 | Wt 202.0 lb

## 2013-07-11 DIAGNOSIS — Z331 Pregnant state, incidental: Secondary | ICD-10-CM

## 2013-07-11 DIAGNOSIS — O2390 Unspecified genitourinary tract infection in pregnancy, unspecified trimester: Secondary | ICD-10-CM

## 2013-07-11 DIAGNOSIS — Z3483 Encounter for supervision of other normal pregnancy, third trimester: Secondary | ICD-10-CM

## 2013-07-11 DIAGNOSIS — O34219 Maternal care for unspecified type scar from previous cesarean delivery: Secondary | ICD-10-CM

## 2013-07-11 DIAGNOSIS — O99019 Anemia complicating pregnancy, unspecified trimester: Secondary | ICD-10-CM

## 2013-07-11 DIAGNOSIS — O98519 Other viral diseases complicating pregnancy, unspecified trimester: Secondary | ICD-10-CM

## 2013-07-11 DIAGNOSIS — Z1389 Encounter for screening for other disorder: Secondary | ICD-10-CM

## 2013-07-11 DIAGNOSIS — A5901 Trichomonal vulvovaginitis: Secondary | ICD-10-CM

## 2013-07-11 LAB — POCT WET PREP (WET MOUNT): Clue Cells Wet Prep Whiff POC: NEGATIVE

## 2013-07-11 LAB — POCT URINALYSIS DIPSTICK
Blood, UA: NEGATIVE
Nitrite, UA: NEGATIVE
Protein, UA: NEGATIVE

## 2013-07-11 NOTE — Progress Notes (Signed)
Reports good fm. Denies uc's, lof, vb, urinary frequency, urgency, hesitancy, or dysuria.  Irregular uc's. Went to [facility] 11/8 for r/u SROM, was intact, but treated for trichomonas. Spec exam: small amount thick white d/c, nonodorous. Wet prep today neg. GBS, Gc/ch today.  Reviewed ptl s/s, fkc.  All questions answered. F/U in 1wk for visit.

## 2013-07-11 NOTE — Patient Instructions (Signed)
Breastfeeding Deciding to breastfeed is one of the best choices you can make for you and your baby. A change in hormones during pregnancy causes your breast tissue to grow and increases the number and size of your milk ducts. These hormones also allow proteins, sugars, and fats from your blood supply to make breast milk in your milk-producing glands. Hormones prevent breast milk from being released before your baby is born as well as prompt milk flow after birth. Once breastfeeding has begun, thoughts of your baby, as well as his or her sucking or crying, can stimulate the release of milk from your milk-producing glands.  BENEFITS OF BREASTFEEDING For Your Baby  Your first milk (colostrum) helps your baby's digestive system function better.   There are antibodies in your milk that help your baby fight off infections.   Your baby has a lower incidence of asthma, allergies, and sudden infant death syndrome.   The nutrients in breast milk are better for your baby than infant formulas and are designed uniquely for your baby's needs.   Breast milk improves your baby's brain development.   Your baby is less likely to develop other conditions, such as childhood obesity, asthma, or type 2 diabetes mellitus.  For You   Breastfeeding helps to create a very special bond between you and your baby.   Breastfeeding is convenient. Breast milk is always available at the correct temperature and costs nothing.   Breastfeeding helps to burn calories and helps you lose the weight gained during pregnancy.   Breastfeeding makes your uterus contract to its prepregnancy size faster and slows bleeding (lochia) after you give birth.   Breastfeeding helps to lower your risk of developing type 2 diabetes mellitus, osteoporosis, and breast or ovarian cancer later in life. SIGNS THAT YOUR BABY IS HUNGRY Early Signs of Hunger  Increased alertness or activity.  Stretching.  Movement of the head from  side to side.  Movement of the head and opening of the mouth when the corner of the mouth or cheek is stroked (rooting).  Increased sucking sounds, smacking lips, cooing, sighing, or squeaking.  Hand-to-mouth movements.  Increased sucking of fingers or hands. Late Signs of Hunger  Fussing.  Intermittent crying. Extreme Signs of Hunger Signs of extreme hunger will require calming and consoling before your baby will be able to breastfeed successfully. Do not wait for the following signs of extreme hunger to occur before you initiate breastfeeding:   Restlessness.  A loud, strong cry.   Screaming. BREASTFEEDING BASICS Breastfeeding Initiation  Find a comfortable place to sit or lie down, with your neck and back well supported.  Place a pillow or rolled up blanket under your baby to bring him or her to the level of your breast (if you are seated). Nursing pillows are specially designed to help support your arms and your baby while you breastfeed.  Make sure that your baby's abdomen is facing your abdomen.   Gently massage your breast. With your fingertips, massage from your chest wall toward your nipple in a circular motion. This encourages milk flow. You may need to continue this action during the feeding if your milk flows slowly.  Support your breast with 4 fingers underneath and your thumb above your nipple. Make sure your fingers are well away from your nipple and your baby's mouth.   Stroke your baby's lips gently with your finger or nipple.   When your baby's mouth is open wide enough, quickly bring your baby to your   breast, placing your entire nipple and as much of the colored area around your nipple (areola) as possible into your baby's mouth.   More areola should be visible above your baby's upper lip than below the lower lip.   Your baby's tongue should be between his or her lower gum and your breast.   Ensure that your baby's mouth is correctly positioned  around your nipple (latched). Your baby's lips should create a seal on your breast and be turned out (everted).  It is common for your baby to suck about 2 3 minutes in order to start the flow of breast milk. Latching Teaching your baby how to latch on to your breast properly is very important. An improper latch can cause nipple pain and decreased milk supply for you and poor weight gain in your baby. Also, if your baby is not latched onto your nipple properly, he or she may swallow some air during feeding. This can make your baby fussy. Burping your baby when you switch breasts during the feeding can help to get rid of the air. However, teaching your baby to latch on properly is still the best way to prevent fussiness from swallowing air while breastfeeding. Signs that your baby has successfully latched on to your nipple:    Silent tugging or silent sucking, without causing you pain.   Swallowing heard between every 3 4 sucks.    Muscle movement above and in front of his or her ears while sucking.  Signs that your baby has not successfully latched on to nipple:   Sucking sounds or smacking sounds from your baby while breastfeeding.  Nipple pain. If you think your baby has not latched on correctly, slip your finger into the corner of your baby's mouth to break the suction and place it between your baby's gums. Attempt breastfeeding initiation again. Signs of Successful Breastfeeding Signs from your baby:   A gradual decrease in the number of sucks or complete cessation of sucking.   Falling asleep.   Relaxation of his or her body.   Retention of a small amount of milk in his or her mouth.   Letting go of your breast by himself or herself. Signs from you:  Breasts that have increased in firmness, weight, and size 1 3 hours after feeding.   Breasts that are softer immediately after breastfeeding.  Increased milk volume, as well as a change in milk consistency and color by  the 5th day of breastfeeding.   Nipples that are not sore, cracked, or bleeding. Signs That Your Baby is Getting Enough Milk  Wetting at least 3 diapers in a 24-hour period. The urine should be clear and pale yellow by age 5 days.  At least 3 stools in a 24-hour period by age 5 days. The stool should be soft and yellow.  At least 3 stools in a 24-hour period by age 7 days. The stool should be seedy and yellow.  No loss of weight greater than 10% of birth weight during the first 3 days of age.  Average weight gain of 4 7 ounces (120 210 mL) per week after age 4 days.  Consistent daily weight gain by age 5 days, without weight loss after the age of 2 weeks. After a feeding, your baby may spit up a small amount. This is common. BREASTFEEDING FREQUENCY AND DURATION Frequent feeding will help you make more milk and can prevent sore nipples and breast engorgement. Breastfeed when you feel the need to reduce   the fullness of your breasts or when your baby shows signs of hunger. This is called "breastfeeding on demand." Avoid introducing a pacifier to your baby while you are working to establish breastfeeding (the first 4 6 weeks after your baby is born). After this time you may choose to use a pacifier. Research has shown that pacifier use during the first year of a baby's life decreases the risk of sudden infant death syndrome (SIDS). Allow your baby to feed on each breast as long as he or she wants. Breastfeed until your baby is finished feeding. When your baby unlatches or falls asleep while feeding from the first breast, offer the second breast. Because newborns are often sleepy in the first few weeks of life, you may need to awaken your baby to get him or her to feed. Breastfeeding times will vary from baby to baby. However, the following rules can serve as a guide to help you ensure that your baby is properly fed:  Newborns (babies 4 weeks of age or younger) may breastfeed every 1 3  hours.  Newborns should not go longer than 3 hours during the day or 5 hours during the night without breastfeeding.  You should breastfeed your baby a minimum of 8 times in a 24-hour period until you begin to introduce solid foods to your baby at around 6 months of age. BREAST MILK PUMPING Pumping and storing breast milk allows you to ensure that your baby is exclusively fed your breast milk, even at times when you are unable to breastfeed. This is especially important if you are going back to work while you are still breastfeeding or when you are not able to be present during feedings. Your lactation consultant can give you guidelines on how long it is safe to store breast milk.  A breast pump is a machine that allows you to pump milk from your breast into a sterile bottle. The pumped breast milk can then be stored in a refrigerator or freezer. Some breast pumps are operated by hand, while others use electricity. Ask your lactation consultant which type will work best for you. Breast pumps can be purchased, but some hospitals and breastfeeding support groups lease breast pumps on a monthly basis. A lactation consultant can teach you how to hand express breast milk, if you prefer not to use a pump.  CARING FOR YOUR BREASTS WHILE YOU BREASTFEED Nipples can become dry, cracked, and sore while breastfeeding. The following recommendations can help keep your breasts moisturized and healthy:  Avoid using soap on your nipples.   Wear a supportive bra. Although not required, special nursing bras and tank tops are designed to allow access to your breasts for breastfeeding without taking off your entire bra or top. Avoid wearing underwire style bras or extremely tight bras.  Air dry your nipples for 3 4minutes after each feeding.   Use only cotton bra pads to absorb leaked breast milk. Leaking of breast milk between feedings is normal.   Use lanolin on your nipples after breastfeeding. Lanolin helps to  maintain your skin's normal moisture barrier. If you use pure lanolin you do not need to wash it off before feeding your baby again. Pure lanolin is not toxic to your baby. You may also hand express a few drops of breast milk and gently massage that milk into your nipples and allow the milk to air dry. In the first few weeks after giving birth, some women experience extremely full breasts (engorgement). Engorgement can make   your breasts feel heavy, warm, and tender to the touch. Engorgement peaks within 3 5 days after you give birth. The following recommendations can help ease engorgement:  Completely empty your breasts while breastfeeding or pumping. You may want to start by applying warm, moist heat (in the shower or with warm water-soaked hand towels) just before feeding or pumping. This increases circulation and helps the milk flow. If your baby does not completely empty your breasts while breastfeeding, pump any extra milk after he or she is finished.  Wear a snug bra (nursing or regular) or tank top for 1 2 days to signal your body to slightly decrease milk production.  Apply ice packs to your breasts, unless this is too uncomfortable for you.  Make sure that your baby is latched on and positioned properly while breastfeeding. If engorgement persists after 48 hours of following these recommendations, contact your health care provider or a lactation consultant. OVERALL HEALTH CARE RECOMMENDATIONS WHILE BREASTFEEDING  Eat healthy foods. Alternate between meals and snacks, eating 3 of each per day. Because what you eat affects your breast milk, some of the foods may make your baby more irritable than usual. Avoid eating these foods if you are sure that they are negatively affecting your baby.  Drink milk, fruit juice, and water to satisfy your thirst (about 10 glasses a day).   Rest often, relax, and continue to take your prenatal vitamins to prevent fatigue, stress, and anemia.  Continue  breast self-awareness checks.  Avoid chewing and smoking tobacco.  Avoid alcohol and drug use. Some medicines that may be harmful to your baby can pass through breast milk. It is important to ask your health care provider before taking any medicine, including all over-the-counter and prescription medicine as well as vitamin and herbal supplements. It is possible to become pregnant while breastfeeding. If birth control is desired, ask your health care provider about options that will be safe for your baby. SEEK MEDICAL CARE IF:   You feel like you want to stop breastfeeding or have become frustrated with breastfeeding.  You have painful breasts or nipples.  Your nipples are cracked or bleeding.  Your breasts are red, tender, or warm.  You have a swollen area on either breast.  You have a fever or chills.  You have nausea or vomiting.  You have drainage other than breast milk from your nipples.  Your breasts do not become full before feedings by the 5th day after you give birth.  You feel sad and depressed.  Your baby is too sleepy to eat well.  Your baby is having trouble sleeping.   Your baby is wetting less than 3 diapers in a 24-hour period.  Your baby has less than 3 stools in a 24-hour period.  Your baby's skin or the white part of his or her eyes becomes yellow.   Your baby is not gaining weight by 5 days of age. SEEK IMMEDIATE MEDICAL CARE IF:   Your baby is overly tired (lethargic) and does not want to wake up and feed.  Your baby develops an unexplained fever. Document Released: 08/11/2005 Document Revised: 04/13/2013 Document Reviewed: 02/02/2013 [employer] Patient Information 2014 [employer].  

## 2013-07-12 LAB — GC/CHLAMYDIA PROBE AMP: CT Probe RNA: NEGATIVE

## 2013-07-14 ENCOUNTER — Encounter: Payer: Self-pay | Admitting: Women's Health

## 2013-07-18 ENCOUNTER — Encounter: Payer: Self-pay | Admitting: Women's Health

## 2013-07-18 ENCOUNTER — Ambulatory Visit (INDEPENDENT_AMBULATORY_CARE_PROVIDER_SITE_OTHER): Payer: Medicaid Other | Admitting: Women's Health

## 2013-07-18 VITALS — BP 122/62 | Wt 203.0 lb

## 2013-07-18 DIAGNOSIS — Z98891 History of uterine scar from previous surgery: Secondary | ICD-10-CM

## 2013-07-18 DIAGNOSIS — O99019 Anemia complicating pregnancy, unspecified trimester: Secondary | ICD-10-CM

## 2013-07-18 DIAGNOSIS — O34219 Maternal care for unspecified type scar from previous cesarean delivery: Secondary | ICD-10-CM

## 2013-07-18 DIAGNOSIS — Z1389 Encounter for screening for other disorder: Secondary | ICD-10-CM

## 2013-07-18 DIAGNOSIS — Z331 Pregnant state, incidental: Secondary | ICD-10-CM

## 2013-07-18 DIAGNOSIS — Z3483 Encounter for supervision of other normal pregnancy, third trimester: Secondary | ICD-10-CM

## 2013-07-18 DIAGNOSIS — O98519 Other viral diseases complicating pregnancy, unspecified trimester: Secondary | ICD-10-CM

## 2013-07-18 DIAGNOSIS — O99013 Anemia complicating pregnancy, third trimester: Secondary | ICD-10-CM

## 2013-07-18 DIAGNOSIS — Z23 Encounter for immunization: Secondary | ICD-10-CM

## 2013-07-18 LAB — POCT URINALYSIS DIPSTICK
Glucose, UA: NEGATIVE
Ketones, UA: NEGATIVE
Leukocytes, UA: NEGATIVE

## 2013-07-18 MED ORDER — INFLUENZA VAC SPLIT QUAD 0.5 ML IM SUSP
0.5000 mL | Freq: Once | INTRAMUSCULAR | Status: AC
  Administered 2013-07-18: 0.5 mL via INTRAMUSCULAR

## 2013-07-18 NOTE — Patient Instructions (Signed)
Braxton Hicks Contractions Pregnancy is commonly associated with contractions of the uterus throughout the pregnancy. Towards the end of pregnancy (32 to 34 weeks), these contractions [facility]) can develop more often and may become more forceful. This is not true labor because these contractions do not result in opening (dilatation) and thinning of the cervix. They are sometimes difficult to tell apart from true labor because these contractions can be forceful and people have different pain tolerances. You should not feel embarrassed if you go to the hospital with false labor. Sometimes, the only way to tell if you are in true labor is for your caregiver to follow the changes in the cervix. How to tell the difference between true and false labor:  False labor.  The contractions of false labor are usually shorter, irregular and not as hard as those of true labor.  They are often felt in the front of the lower abdomen and in the groin.  They may leave with walking around or changing positions while lying down.  They get weaker and are shorter lasting as time goes on.  These contractions are usually irregular.  They do not usually become progressively stronger, regular and closer together as with true labor.  True labor.  Contractions in true labor last 30 to 70 seconds, become very regular, usually become more intense, and increase in frequency.  They do not go away with walking.  The discomfort is usually felt in the top of the uterus and spreads to the lower abdomen and low back.  True labor can be determined by your caregiver with an exam. This will show that the cervix is dilating and getting thinner. If there are no prenatal problems or other health problems associated with the pregnancy, it is completely safe to be sent home with false labor and await the onset of true labor. HOME CARE INSTRUCTIONS   Keep up with your usual exercises and instructions.  Take medications as  directed.  Keep your regular prenatal appointment.  Eat and drink lightly if you think you are going into labor.  If BH contractions are making you uncomfortable:  Change your activity position from lying down or resting to walking/walking to resting.  Sit and rest in a tub of warm water.  Drink 2 to 3 glasses of water. Dehydration may cause B-H contractions.  Do slow and deep breathing several times an hour. SEEK IMMEDIATE MEDICAL CARE IF:   Your contractions continue to become stronger, more regular, and closer together.  You have a gushing, burst or leaking of fluid from the vagina.  An oral temperature above 102 F (38.9 C) develops.  You have passage of blood-tinged mucus.  You develop vaginal bleeding.  You develop continuous belly (abdominal) pain.  You have low back pain that you never had before.  You feel the baby's head pushing down causing pelvic pressure.  The baby is not moving as much as it used to. Document Released: 08/11/2005 Document Revised: 11/03/2011 Document Reviewed: 02/02/2009 [facility] Patient Information 2014 [place], [place].  Iron-Rich Diet An iron-rich diet contains foods that are good sources of iron. Iron is an important mineral that helps your body produce hemoglobin. Hemoglobin is a protein in red blood cells that carries oxygen to the body's tissues. Sometimes, the iron level in your blood can be low. This may be caused by:  A lack of iron in your diet.  Blood loss.  Times of growth, such as during pregnancy or during a child's growth and  development. Low levels of iron can cause a decrease in the number of red blood cells. This can result in iron deficiency anemia. Iron deficiency anemia symptoms include:  Tiredness.  Weakness.  Irritability.  Increased chance of infection. Here are some recommendations for daily iron intake:  Males older than 24 years of age need 8 mg of iron per day.  Women ages 72 to 83 need 18 mg of iron  per day.  Pregnant women need 27 mg of iron per day, and women who are over 52 years of age and breastfeeding need 9 mg of iron per day.  Women over the age of 65 need 8 mg of iron per day. SOURCES OF IRON There are 2 types of iron that are found in food: heme iron and nonheme iron. Heme iron is absorbed by the body better than nonheme iron. Heme iron is found in meat, poultry, and fish. Nonheme iron is found in grains, beans, and vegetables. Heme Iron Sources Food / Iron (mg)  Chicken liver, 3 oz (85 g)/ 10 mg  Beef liver, 3 oz (85 g)/ 5.5 mg  Oysters, 3 oz (85 g)/ 8 mg  Beef, 3 oz (85 g)/ 2 to 3 mg  Shrimp, 3 oz (85 g)/ 2.8 mg  [place], 3 oz (85 g)/ 2 mg  Chicken, 3 oz (85 g) / 1 mg  Fish (tuna, halibut), 3 oz (85 g)/ 1 mg  Pork, 3 oz (85 g)/ 0.9 mg Nonheme Iron Sources Food / Iron (mg)  Ready-to-eat breakfast cereal, iron-fortified / 3.9 to 7 mg  Tofu,  cup / 3.4 mg  Kidney beans,  cup / 2.6 mg  Baked potato with skin / 2.7 mg  Asparagus,  cup / 2.2 mg  Avocado / 2 mg  Dried peaches,  cup / 1.6 mg  Raisins,  cup / 1.5 mg  Soy milk, 1 cup / 1.5 mg  Whole-wheat bread, 1 slice / 1.2 mg  Spinach, 1 cup / 0.8 mg  Broccoli,  cup / 0.6 mg IRON ABSORPTION Certain foods can decrease the body's absorption of iron. Try to avoid these foods and beverages while eating meals with iron-containing foods:  Coffee.  Tea.  Fiber.  Soy. Foods containing vitamin C can help increase the amount of iron your body absorbs from iron sources, especially from nonheme sources. Eat foods with vitamin C along with iron-containing foods to increase your iron absorption. Foods that are high in vitamin C include many fruits and vegetables. Some good sources are:  Fresh orange juice.  Oranges.  Strawberries.  Mangoes.  Grapefruit.  Red bell peppers.  Green bell peppers.  Broccoli.  Potatoes with skin.  Tomato juice. Document Released: 03/25/2005 Document  Revised: 11/03/2011 Document Reviewed: 01/30/2011 [facility] Patient Information 2014 [place], [place].

## 2013-07-18 NOTE — Progress Notes (Signed)
Reports good fm. Denies uc's, lof, vb, urinary frequency, urgency, hesitancy, or dysuria.  No complaints.  States she's got a lot going on right now, and just would like to go ahead w/ RLTCS. Notified [facility], will get scheduled. Hasn't really been taking Fe b/c it made her sick. Too increase fe-rich foods. Info given. Reviewed labor s/s, fkc.  All questions answered. F/U in 1wk for visit. Flu shot today

## 2013-07-25 ENCOUNTER — Encounter: Payer: Self-pay | Admitting: Women's Health

## 2013-07-25 ENCOUNTER — Ambulatory Visit (INDEPENDENT_AMBULATORY_CARE_PROVIDER_SITE_OTHER): Payer: Medicaid Other | Admitting: Women's Health

## 2013-07-25 VITALS — BP 118/68 | Wt 208.0 lb

## 2013-07-25 DIAGNOSIS — Z3483 Encounter for supervision of other normal pregnancy, third trimester: Secondary | ICD-10-CM

## 2013-07-25 DIAGNOSIS — Z1389 Encounter for screening for other disorder: Secondary | ICD-10-CM

## 2013-07-25 DIAGNOSIS — O98519 Other viral diseases complicating pregnancy, unspecified trimester: Secondary | ICD-10-CM

## 2013-07-25 DIAGNOSIS — O99019 Anemia complicating pregnancy, unspecified trimester: Secondary | ICD-10-CM

## 2013-07-25 DIAGNOSIS — O34219 Maternal care for unspecified type scar from previous cesarean delivery: Secondary | ICD-10-CM

## 2013-07-25 DIAGNOSIS — Z331 Pregnant state, incidental: Secondary | ICD-10-CM

## 2013-07-25 LAB — POCT URINALYSIS DIPSTICK
Blood, UA: NEGATIVE
Glucose, UA: NEGATIVE
Ketones, UA: NEGATIVE
Leukocytes, UA: NEGATIVE
Nitrite, UA: NEGATIVE

## 2013-07-25 NOTE — Patient Instructions (Signed)
Call the office or go to [facility] if:  You begin to have strong, frequent contractions  Your water breaks.  Sometimes it is a big gush of fluid, sometimes it is just a trickle that keeps getting your panties wet or running down your legs  You have vaginal bleeding.  It is normal to have a small amount of spotting if your cervix was checked.   You don't feel your baby moving like normal.  If you don't, get you something to eat and drink and lay down and focus on feeling your baby move.  You should feel at least 10 movements in 2 hours.  If you don't, you should call the office or go to [facility].   Cesarean Delivery  Cesarean delivery is the birth of a baby through a cut (incision) in the abdomen and womb (uterus).  LET YOUR CAREGIVER KNOW ABOUT:  Complicationsinvolving the pregnancy.  Allergies.  Medicines taken including herbs, eyedrops, over-the-counter medicines, and creams.  Use of steroids (by mouth or creams).  Previous problems with anesthetics or numbing medicine.  Previous surgery.  History of blood clots.  History of bleeding or blood problems.  Other health problems. RISKS AND COMPLICATIONS   Bleeding.  Infection.  Blood clots.  Injury to surrounding organs.  Anesthesia problems.  Injury to the baby. BEFORE THE PROCEDURE   A tube (Foley catheter) will be placed in your bladder. The Foley catheter drains the urine from your bladder into a bag. This keeps your bladder empty during surgery.  An intravenous access tube (IV) will be placed in your arm.  Hair may be removed from your pubic area and your lower abdomen. This is to prevent infection in the incision site.  You may be given an antacid medicine to drink. This will prevent acid contents in your stomach from going into your lungs if you vomit during the surgery.  You may be given an antibiotic medicine to prevent infection. PROCEDURE   You may be given medicine to numb the lower  half of your body (regional anesthetic). If you were in labor, you may have already had an epidural in place which can be used in both labor and cesarean delivery. You may possibly be given medicine to make you sleep (general anesthetic) though this is not as common.  An incision will be made in your abdomen that extends to your uterus. There are 2 basic kinds of incisions:  The horizontal (transverse) incision. Horizontal incisions are used for most routine cesarean deliveries.  The vertical (up and down) incision. This is less commonly used. This is most often reserved for women who have a serious complication (extreme prematurity) or under emergency situations.  The horizontal and vertical incisions may both be used at the same time. However, this is very uncommon.  Your baby will then be delivered. AFTER THE PROCEDURE   If you were awake during the surgery, you will see your baby right away. If you were asleep, you will see your baby as soon as you are awake.  You may breastfeed your baby after surgery.  You may be able to get up and walk the same day as the surgery. If you need to stay in bed for a period of time, you will receive help to turn, cough, and take deep breaths after surgery. This helps prevent lung problems such as pneumonia.  Do not get out of bed alone the first time after surgery. You will need help getting out of bed  until you are able to do this by yourself.  You may be able to shower the day after your cesarean delivery. After the bandage (dressing) is taken off the incision site, a nurse will assist you to shower, if you like.  You will have pneumatic compressing hose placed on your feet or lower legs. These hose are used to prevent blood clots. When you are up and walking regularly, they will no longer be necessary.  Do not cross your legs when you sit.  Save any blood clots that you pass. If you pass a clot while on the toilet, do not flush it. Call for the  nurse. Tell the nurse if you think you are bleeding too much or passing too many clots.  Start drinking liquids and eating food as directed by your caregiver. If your stomach is not ready, drinking and eating too soon can cause an increase in bloating and swelling of your intestine and abdomen. This is very uncomfortable.  You will be given medicine as needed. Let your caregivers know if you are hurting. They want you to be comfortable. You may also be given an antibiotic to prevent an infection.  Your IV will be taken out when you are drinking a reasonable amount of fluids. The Foley catheter is taken out when you are up and walking.  If your blood type is Rh negative and your baby's blood type is Rh positive, you will be given a shot of anti-D immune globulin. This shot prevents you from having Rh problems with a future pregnancy. You should get the shot even if you had your tubes tied (tubal ligation).  If you are allowed to take the baby for a walk, place the baby in the bassinet and push it. Do not carry your baby in your arms. Document Released: 08/11/2005 Document Revised: 11/03/2011 Document Reviewed: 03/02/2013 [facility] Patient Information 2014 [place], [place].

## 2013-07-25 NOTE — Progress Notes (Signed)
Reports good fm. Denies uc's, lof, vb, urinary frequency, urgency, hesitancy, or dysuria.  No complaints.  Requests SVE. Reviewed labor s/s, fkc.  Hasn't gotten c/s date yet, discussed w/ [facility], called to schedule but scheduler not there today, will be back tomorrow. Informed pt we would call her tomorrow to let her know c/s date/time. All questions answered.

## 2013-07-25 NOTE — Addendum Note (Signed)
Addended by: Lazaro Arms on: 07/25/2013 02:18 PM   Modules accepted: Orders

## 2013-07-27 ENCOUNTER — Encounter (HOSPITAL_COMMUNITY): Payer: Self-pay

## 2013-07-27 ENCOUNTER — Encounter (HOSPITAL_COMMUNITY)
Admission: RE | Admit: 2013-07-27 | Discharge: 2013-07-27 | Disposition: A | Discharge status: Home / Self Care | Payer: Medicaid Other | Source: Ambulatory Visit | Attending: Obstetrics and Gynecology | Admitting: Obstetrics and Gynecology

## 2013-07-27 LAB — CBC
HCT: 36.3 % (ref 36.0–46.0)
Hemoglobin: 12.2 g/dL (ref 12.0–15.0)
MCH: 28.4 pg (ref 26.0–34.0)
MCHC: 33.6 g/dL (ref 30.0–36.0)
MCV: 84.4 fL (ref 78.0–100.0)
RBC: 4.3 MIL/uL (ref 3.87–5.11)

## 2013-07-27 LAB — TYPE AND SCREEN
ABO/RH(D): A POS
Antibody Screen: NEGATIVE

## 2013-07-27 NOTE — Patient Instructions (Signed)
20 Rikayla L Winnick  07/27/2013   Your procedure is scheduled on:  07/28/13  Enter through the Main Entrance of [facility] at 745 AM.  Pick up the phone at the desk and dial [phone].   Call this number if you have problems the morning of surgery: [phone]   Remember:   Do not eat food:After Midnight.  Do not drink clear liquids: After Midnight.  Take these medicines the morning of surgery with A SIP OF WATER: NA   Do not wear jewelry, make-up or nail polish.  Do not wear lotions, powders, or perfumes. You may wear deodorant.  Do not shave 48 hours prior to surgery.  Do not bring valuables to the hospital.  [facility] is not   responsible for any belongings or valuables brought to the hospital.  Contacts, dentures or bridgework may not be worn into surgery.  Leave suitcase in the car. After surgery it may be brought to your room.  For patients admitted to the hospital, checkout time is 11:00 AM the day of              discharge.   Patients discharged the day of surgery will not be allowed to drive             home.  Name and phone number of your driver: NA  Special Instructions:   Shower using CHG 2 nights before surgery and the night before surgery.  If you shower the day of surgery use CHG.  Use special wash - you have one bottle of CHG for all showers.  You should use approximately 1/3 of the bottle for each shower.   Please read over the following fact sheets that you were given:   Surgical Site Infection Prevention

## 2013-07-28 ENCOUNTER — Encounter (HOSPITAL_COMMUNITY)
Admission: RE | Disposition: A | Discharge status: Home / Self Care | Payer: Self-pay | Source: Ambulatory Visit | Attending: Obstetrics and Gynecology

## 2013-07-28 ENCOUNTER — Inpatient Hospital Stay (HOSPITAL_COMMUNITY): Payer: Medicaid Other | Admitting: Anesthesiology

## 2013-07-28 ENCOUNTER — Inpatient Hospital Stay (HOSPITAL_COMMUNITY)
Admission: RE | Admit: 2013-07-28 | Discharge: 2013-07-30 | DRG: 766 | Disposition: A | Discharge status: Home / Self Care | Payer: Medicaid Other | Source: Ambulatory Visit | Attending: Obstetrics and Gynecology | Admitting: Obstetrics and Gynecology

## 2013-07-28 ENCOUNTER — Encounter (HOSPITAL_COMMUNITY): Payer: Medicaid Other | Admitting: Anesthesiology

## 2013-07-28 ENCOUNTER — Encounter (HOSPITAL_COMMUNITY): Payer: Self-pay

## 2013-07-28 DIAGNOSIS — N736 Female pelvic peritoneal adhesions (postinfective): Secondary | ICD-10-CM

## 2013-07-28 DIAGNOSIS — Z98891 History of uterine scar from previous surgery: Secondary | ICD-10-CM

## 2013-07-28 DIAGNOSIS — Z87891 Personal history of nicotine dependence: Secondary | ICD-10-CM

## 2013-07-28 DIAGNOSIS — O34219 Maternal care for unspecified type scar from previous cesarean delivery: Secondary | ICD-10-CM

## 2013-07-28 DIAGNOSIS — O98519 Other viral diseases complicating pregnancy, unspecified trimester: Secondary | ICD-10-CM

## 2013-07-28 HISTORY — DX: History of uterine scar from previous surgery: Z98.891

## 2013-07-28 LAB — RPR: RPR Ser Ql: NONREACTIVE

## 2013-07-28 SURGERY — Surgical Case
Anesthesia: Spinal | Site: Abdomen

## 2013-07-28 MED ORDER — LANOLIN HYDROUS EX OINT: 1.0000 "application " | TOPICAL_OINTMENT | CUTANEOUS | Status: DC | PRN

## 2013-07-28 MED ORDER — MENTHOL 3 MG MT LOZG: 1.0000 | LOZENGE | OROMUCOSAL | Status: DC | PRN

## 2013-07-28 MED ORDER — ONDANSETRON HCL 4 MG/2ML IJ SOLN: 4.0000 mg | INTRAMUSCULAR | Status: DC | PRN

## 2013-07-28 MED ORDER — KETOROLAC TROMETHAMINE 30 MG/ML IJ SOLN
INTRAMUSCULAR | Status: AC
  Administered 2013-07-28: 30 mg via INTRAVENOUS
  Filled 2013-07-28: qty 1

## 2013-07-28 MED ORDER — SCOPOLAMINE 1 MG/3DAYS TD PT72
MEDICATED_PATCH | TRANSDERMAL | Status: AC
  Administered 2013-07-28: 1.5 mg via TRANSDERMAL
  Filled 2013-07-28: qty 1

## 2013-07-28 MED ORDER — TETANUS-DIPHTH-ACELL PERTUSSIS 5-2.5-18.5 LF-MCG/0.5 IM SUSP: 0.5000 mL | Freq: Once | INTRAMUSCULAR | Status: DC

## 2013-07-28 MED ORDER — MORPHINE SULFATE (PF) 0.5 MG/ML IJ SOLN
INTRAMUSCULAR | Status: DC | PRN
  Administered 2013-07-28: .2 mg via INTRATHECAL

## 2013-07-28 MED ORDER — LACTATED RINGERS IV SOLN
INTRAVENOUS | Status: DC
  Administered 2013-07-28: 20:00:00 via INTRAVENOUS

## 2013-07-28 MED ORDER — METOCLOPRAMIDE HCL 5 MG/ML IJ SOLN: 10.0000 mg | Freq: Three times a day (TID) | INTRAMUSCULAR | Status: DC | PRN

## 2013-07-28 MED ORDER — NALBUPHINE HCL 10 MG/ML IJ SOLN
5.0000 mg | INTRAMUSCULAR | Status: DC | PRN
  Filled 2013-07-28: qty 1

## 2013-07-28 MED ORDER — ONDANSETRON HCL 4 MG/2ML IJ SOLN: 4.0000 mg | Freq: Three times a day (TID) | INTRAMUSCULAR | Status: DC | PRN

## 2013-07-28 MED ORDER — PHENYLEPHRINE 8 MG IN D5W 100 ML (0.08MG/ML) PREMIX OPTIME
INJECTION | INTRAVENOUS | Status: AC
  Filled 2013-07-28: qty 100

## 2013-07-28 MED ORDER — KETOROLAC TROMETHAMINE 30 MG/ML IJ SOLN
30.0000 mg | Freq: Four times a day (QID) | INTRAMUSCULAR | Status: DC | PRN
  Administered 2013-07-28: 30 mg via INTRAVENOUS

## 2013-07-28 MED ORDER — PRENATAL MULTIVITAMIN CH
1.0000 | ORAL_TABLET | Freq: Every day | ORAL | Status: DC
  Administered 2013-07-29 – 2013-07-30 (×2): 1 via ORAL
  Filled 2013-07-28 (×2): qty 1

## 2013-07-28 MED ORDER — MAGNESIUM HYDROXIDE 400 MG/5ML PO SUSP: 30.0000 mL | ORAL | Status: DC | PRN

## 2013-07-28 MED ORDER — ONDANSETRON HCL 4 MG/2ML IJ SOLN
INTRAMUSCULAR | Status: AC
  Filled 2013-07-28: qty 2

## 2013-07-28 MED ORDER — IBUPROFEN 600 MG PO TABS: 600.0000 mg | ORAL_TABLET | Freq: Four times a day (QID) | ORAL | Status: DC

## 2013-07-28 MED ORDER — ACETAMINOPHEN 500 MG PO TABS
1000.0000 mg | ORAL_TABLET | Freq: Four times a day (QID) | ORAL | Status: AC
  Administered 2013-07-28: 1000 mg via ORAL
  Filled 2013-07-28: qty 2

## 2013-07-28 MED ORDER — DIBUCAINE 1 % RE OINT: 1.0000 "application " | TOPICAL_OINTMENT | RECTAL | Status: DC | PRN

## 2013-07-28 MED ORDER — FENTANYL CITRATE 0.05 MG/ML IJ SOLN
INTRAMUSCULAR | Status: AC
  Filled 2013-07-28: qty 2

## 2013-07-28 MED ORDER — OXYTOCIN 40 UNITS IN LACTATED RINGERS INFUSION - SIMPLE MED: 62.5000 mL/h | INTRAVENOUS | Status: AC

## 2013-07-28 MED ORDER — MEPERIDINE HCL 25 MG/ML IJ SOLN: 6.2500 mg | INTRAMUSCULAR | Status: DC | PRN

## 2013-07-28 MED ORDER — SIMETHICONE 80 MG PO CHEW: 80.0000 mg | CHEWABLE_TABLET | Freq: Three times a day (TID) | ORAL | Status: DC

## 2013-07-28 MED ORDER — WITCH HAZEL-GLYCERIN EX PADS: 1.0000 "application " | MEDICATED_PAD | CUTANEOUS | Status: DC | PRN

## 2013-07-28 MED ORDER — ONDANSETRON HCL 4 MG/2ML IJ SOLN
INTRAMUSCULAR | Status: DC | PRN
  Administered 2013-07-28: 4 mg via INTRAVENOUS

## 2013-07-28 MED ORDER — PROMETHAZINE HCL 25 MG/ML IJ SOLN: 6.2500 mg | INTRAMUSCULAR | Status: DC | PRN

## 2013-07-28 MED ORDER — PHENYLEPHRINE 8 MG IN D5W 100 ML (0.08MG/ML) PREMIX OPTIME
INJECTION | INTRAVENOUS | Status: DC | PRN
  Administered 2013-07-28: 60 ug/min via INTRAVENOUS

## 2013-07-28 MED ORDER — FENTANYL CITRATE 0.05 MG/ML IJ SOLN
25.0000 ug | INTRAMUSCULAR | Status: DC | PRN
  Administered 2013-07-28: 50 ug via INTRAVENOUS

## 2013-07-28 MED ORDER — 0.9 % SODIUM CHLORIDE (POUR BTL) OPTIME
TOPICAL | Status: DC | PRN
  Administered 2013-07-28: 1000 mL

## 2013-07-28 MED ORDER — OXYTOCIN 10 UNIT/ML IJ SOLN
40.0000 [IU] | INTRAVENOUS | Status: DC | PRN
  Administered 2013-07-28: 40 [IU] via INTRAVENOUS

## 2013-07-28 MED ORDER — MIDAZOLAM HCL 2 MG/2ML IJ SOLN: 0.5000 mg | Freq: Once | INTRAMUSCULAR | Status: DC | PRN

## 2013-07-28 MED ORDER — SIMETHICONE 80 MG PO CHEW: 80.0000 mg | CHEWABLE_TABLET | ORAL | Status: DC | PRN

## 2013-07-28 MED ORDER — SIMETHICONE 80 MG PO CHEW
80.0000 mg | CHEWABLE_TABLET | Freq: Three times a day (TID) | ORAL | Status: DC
  Administered 2013-07-28 – 2013-07-30 (×6): 80 mg via ORAL
  Filled 2013-07-28 (×5): qty 1

## 2013-07-28 MED ORDER — NALOXONE HCL 0.4 MG/ML IJ SOLN: 0.4000 mg | INTRAMUSCULAR | Status: DC | PRN

## 2013-07-28 MED ORDER — SODIUM CHLORIDE 0.9 % IJ SOLN: 3.0000 mL | INTRAMUSCULAR | Status: DC | PRN

## 2013-07-28 MED ORDER — FENTANYL CITRATE 0.05 MG/ML IJ SOLN
INTRAMUSCULAR | Status: DC | PRN
  Administered 2013-07-28: 12.5 ug via INTRATHECAL

## 2013-07-28 MED ORDER — TETANUS-DIPHTH-ACELL PERTUSSIS 5-2.5-18.5 LF-MCG/0.5 IM SUSP
0.5000 mL | Freq: Once | INTRAMUSCULAR | Status: AC
  Administered 2013-07-29: 0.5 mL via INTRAMUSCULAR
  Filled 2013-07-28: qty 0.5

## 2013-07-28 MED ORDER — IBUPROFEN 600 MG PO TABS: 600.0000 mg | ORAL_TABLET | Freq: Four times a day (QID) | ORAL | Status: DC | PRN

## 2013-07-28 MED ORDER — LACTATED RINGERS IV SOLN
INTRAVENOUS | Status: DC
  Administered 2013-07-28 (×2): via INTRAVENOUS

## 2013-07-28 MED ORDER — SCOPOLAMINE 1 MG/3DAYS TD PT72
1.0000 | MEDICATED_PATCH | Freq: Once | TRANSDERMAL | Status: DC
  Administered 2013-07-28: 1.5 mg via TRANSDERMAL

## 2013-07-28 MED ORDER — SENNOSIDES-DOCUSATE SODIUM 8.6-50 MG PO TABS
2.0000 | ORAL_TABLET | ORAL | Status: DC
  Administered 2013-07-29 (×2): 2 via ORAL
  Filled 2013-07-28 (×2): qty 2

## 2013-07-28 MED ORDER — IBUPROFEN 600 MG PO TABS
600.0000 mg | ORAL_TABLET | Freq: Four times a day (QID) | ORAL | Status: DC
  Administered 2013-07-28 – 2013-07-30 (×8): 600 mg via ORAL
  Filled 2013-07-28 (×9): qty 1

## 2013-07-28 MED ORDER — SENNOSIDES-DOCUSATE SODIUM 8.6-50 MG PO TABS: 2.0000 | ORAL_TABLET | ORAL | Status: DC

## 2013-07-28 MED ORDER — CEFAZOLIN SODIUM-DEXTROSE 2-3 GM-% IV SOLR
INTRAVENOUS | Status: AC
  Administered 2013-07-28: 2 g via INTRAVENOUS
  Filled 2013-07-28: qty 50

## 2013-07-28 MED ORDER — ONDANSETRON HCL 4 MG PO TABS: 4.0000 mg | ORAL_TABLET | ORAL | Status: DC | PRN

## 2013-07-28 MED ORDER — ZOLPIDEM TARTRATE 5 MG PO TABS: 5.0000 mg | ORAL_TABLET | Freq: Every evening | ORAL | Status: DC | PRN

## 2013-07-28 MED ORDER — SIMETHICONE 80 MG PO CHEW
80.0000 mg | CHEWABLE_TABLET | ORAL | Status: DC
  Administered 2013-07-29 (×2): 80 mg via ORAL
  Filled 2013-07-28 (×2): qty 1

## 2013-07-28 MED ORDER — OXYTOCIN 40 UNITS IN LACTATED RINGERS INFUSION - SIMPLE MED: 62.5000 mL/h | INTRAVENOUS | Status: DC

## 2013-07-28 MED ORDER — CEFAZOLIN SODIUM-DEXTROSE 2-3 GM-% IV SOLR: 2.0000 g | INTRAVENOUS | Status: DC

## 2013-07-28 MED ORDER — KETOROLAC TROMETHAMINE 30 MG/ML IJ SOLN: 30.0000 mg | Freq: Four times a day (QID) | INTRAMUSCULAR | Status: DC | PRN

## 2013-07-28 MED ORDER — OXYCODONE-ACETAMINOPHEN 5-325 MG PO TABS
1.0000 | ORAL_TABLET | ORAL | Status: DC | PRN
  Administered 2013-07-29 (×2): 1 via ORAL
  Filled 2013-07-28: qty 1
  Filled 2013-07-28 (×2): qty 2
  Filled 2013-07-28: qty 1

## 2013-07-28 MED ORDER — MORPHINE SULFATE 0.5 MG/ML IJ SOLN
INTRAMUSCULAR | Status: AC
  Filled 2013-07-28: qty 10

## 2013-07-28 MED ORDER — DEXTROSE 5 % IV SOLN
1.0000 ug/kg/h | INTRAVENOUS | Status: DC | PRN
  Filled 2013-07-28: qty 2

## 2013-07-28 MED ORDER — SCOPOLAMINE 1 MG/3DAYS TD PT72
1.0000 | MEDICATED_PATCH | Freq: Once | TRANSDERMAL | Status: DC
Start: 2013-07-28 — End: 2013-07-30
  Filled 2013-07-28: qty 1

## 2013-07-28 MED ORDER — SIMETHICONE 80 MG PO CHEW: 80.0000 mg | CHEWABLE_TABLET | ORAL | Status: DC

## 2013-07-28 MED ORDER — BUPIVACAINE IN DEXTROSE 0.75-8.25 % IT SOLN
INTRATHECAL | Status: DC | PRN
  Administered 2013-07-28: 1.4 mL via INTRATHECAL

## 2013-07-28 MED ORDER — LACTATED RINGERS IV SOLN: INTRAVENOUS | Status: DC

## 2013-07-28 MED ORDER — DIPHENHYDRAMINE HCL 50 MG/ML IJ SOLN: 12.5000 mg | INTRAMUSCULAR | Status: DC | PRN

## 2013-07-28 MED ORDER — DIPHENHYDRAMINE HCL 25 MG PO CAPS: 25.0000 mg | ORAL_CAPSULE | ORAL | Status: DC | PRN

## 2013-07-28 MED ORDER — PRENATAL MULTIVITAMIN CH: 1.0000 | ORAL_TABLET | Freq: Every day | ORAL | Status: DC

## 2013-07-28 MED ORDER — MEASLES, MUMPS & RUBELLA VAC ~~LOC~~ INJ: 0.5000 mL | INJECTION | Freq: Once | SUBCUTANEOUS | Status: DC

## 2013-07-28 MED ORDER — OXYTOCIN 10 UNIT/ML IJ SOLN
INTRAMUSCULAR | Status: AC
  Filled 2013-07-28: qty 4

## 2013-07-28 MED ORDER — DIPHENHYDRAMINE HCL 50 MG/ML IJ SOLN: 25.0000 mg | INTRAMUSCULAR | Status: DC | PRN

## 2013-07-28 MED ORDER — OXYCODONE-ACETAMINOPHEN 5-325 MG PO TABS: 1.0000 | ORAL_TABLET | ORAL | Status: DC | PRN

## 2013-07-28 MED ORDER — DIPHENHYDRAMINE HCL 25 MG PO CAPS: 25.0000 mg | ORAL_CAPSULE | Freq: Four times a day (QID) | ORAL | Status: DC | PRN

## 2013-07-28 SURGICAL SUPPLY — 35 items
APL SKNCLS STERI-STRIP NONHPOA (GAUZE/BANDAGES/DRESSINGS) ×1
BENZOIN TINCTURE PRP APPL 2/3 (GAUZE/BANDAGES/DRESSINGS) ×1 IMPLANT
CLAMP CORD UMBIL (MISCELLANEOUS) IMPLANT
CLOTH BEACON ORANGE TIMEOUT ST (SAFETY) ×2 IMPLANT
DRAPE LG THREE QUARTER DISP (DRAPES) ×1 IMPLANT
DRSG OPSITE POSTOP 4X10 (GAUZE/BANDAGES/DRESSINGS) ×2 IMPLANT
DURAPREP 26ML APPLICATOR (WOUND CARE) ×2 IMPLANT
ELECT REM PT RETURN 9FT ADLT (ELECTROSURGICAL) ×2
ELECTRODE REM PT RTRN 9FT ADLT (ELECTROSURGICAL) ×1 IMPLANT
EXTRACTOR VACUUM KIWI (MISCELLANEOUS) IMPLANT
GLOVE BIO SURGEON ST LM GN SZ9 (GLOVE) ×1 IMPLANT
GLOVE BIOGEL PI IND STRL 9 (GLOVE) ×1 IMPLANT
GLOVE BIOGEL PI INDICATOR 9 (GLOVE) ×1
GOWN PREVENTION PLUS XLARGE (GOWN DISPOSABLE) ×2 IMPLANT
GOWN STRL REIN 3XL LVL4 (GOWN DISPOSABLE) ×2 IMPLANT
GOWN STRL REIN XL XLG (GOWN DISPOSABLE) ×2 IMPLANT
NDL HYPO 25X5/8 SAFETYGLIDE (NEEDLE) IMPLANT
NEEDLE HYPO 25X5/8 SAFETYGLIDE (NEEDLE) IMPLANT
NS IRRIG 1000ML POUR BTL (IV SOLUTION) ×2 IMPLANT
PACK C SECTION WH (CUSTOM PROCEDURE TRAY) ×2 IMPLANT
PAD OB MATERNITY 4.3X12.25 (PERSONAL CARE ITEMS) ×2 IMPLANT
RETRACTOR WND ALEXIS 25 LRG (MISCELLANEOUS) IMPLANT
RTRCTR C-SECT PINK 25CM LRG (MISCELLANEOUS) IMPLANT
RTRCTR WOUND ALEXIS 25CM LRG (MISCELLANEOUS)
STRIP CLOSURE SKIN 1/2X4 (GAUZE/BANDAGES/DRESSINGS) ×1 IMPLANT
SUT CHROMIC 0 CTX 36 (SUTURE) ×4 IMPLANT
SUT VIC AB 0 CT1 27 (SUTURE) ×4
SUT VIC AB 0 CT1 27XBRD ANBCTR (SUTURE) ×1 IMPLANT
SUT VIC AB 2-0 CT1 27 (SUTURE) ×6
SUT VIC AB 2-0 CT1 TAPERPNT 27 (SUTURE) ×2 IMPLANT
SUT VIC AB 4-0 KS 27 (SUTURE) ×2 IMPLANT
SYR BULB IRRIGATION 50ML (SYRINGE) IMPLANT
TOWEL OR 17X24 6PK STRL BLUE (TOWEL DISPOSABLE) ×3 IMPLANT
TRAY FOLEY CATH 14FR (SET/KITS/TRAYS/PACK) ×1 IMPLANT
WATER STERILE IRR 1000ML POUR (IV SOLUTION) ×1 IMPLANT

## 2013-07-28 NOTE — Op Note (Signed)
  Kaylee Miller PROCEDURE DATE: 07/28/2013  PREOPERATIVE DIAGNOSES: Intrauterine pregnancy at  [redacted]w[redacted]d weeks gestation; patient declines vag del attempt and repeat cesarean  POSTOPERATIVE DIAGNOSES: The same  PROCEDURe: Repeat Low Transverse Cesarean Section  SURGEON:  Jorje Guild  ASSISTANT:    ANESTHESIOLOGIST: Dr.   INDICATIONS: Kaylee Miller is a 22 y.o. [id number] at [redacted]w[redacted]d here for cesarean section secondary to the indications listed under preoperative diagnoses; please see preoperative note for further details.  The risks of cesarean section were discussed with the patient including but were not limited to: bleeding which may require transfusion or reoperation; infection which may require antibiotics; injury to bowel, bladder, ureters or other surrounding organs; injury to the fetus; need for additional procedures including hysterectomy in the event of a life-threatening hemorrhage; placental abnormalities wth subsequent pregnancies, incisional problems, thromboembolic phenomenon and other postoperative/anesthesia complications.   The patient concurred with the proposed plan, giving informed written consent for the procedure.    FINDINGS:  Viable female infant in cephalic presentation.  Apgars 9 and 9.  Clear amniotic fluid.  Intact placenta, three vessel cord.  Normal uterus, fallopian tubes and ovaries bilaterally. Adhesion from anterior abd wall to right side of anterior uterus ANESTHESIA: Spinal INTRAVENOUS FLUIDS: 2000 ml ESTIMATED BLOOD LOSS: 1000 ml URINE OUTPUT:   ml SPECIMENS: Placenta sent to L&D COMPLICATIONS: None immediate  PROCEDURE IN DETAIL:  The patient preoperatively received intravenous antibiotics and had sequential compression devices applied to her lower extremities.  She was then taken to the operating room where spinal anesthesia was administered and was found to be adequate. She was then placed in a dorsal supine position with a leftward tilt, and  prepped and draped in a sterile manner.  A foley catheter was placed into her bladder and attached to constant gravity.  After an adequate timeout was performed, a Pfannenstiel skin incision was made with scalpel and carried through to the underlying layer of fascia. The fascia was incised in the midline, and this incision was extended bilaterally using the Mayo scissors.  Kocher clamps were applied to the superior aspect of the fascial incision and the underlying rectus muscles were dissected off bluntly. A similar process was carried out on the inferior aspect of the fascial incision. The rectus muscles were separated in the midline bluntly and the peritoneum was entered bluntly. Attention was turned to the lower uterine segment where a low transverse hysterotomy was made with a scalpel and extended bilaterally bluntly.  The infant was successfully delivered, the cord was clamped and cut and the infant was handed over to awaiting neonatology team. Uterine massage was then administered, and the placenta delivered intact with a three-vessel cord. The uterus was then cleared of clot and debris.  The hysterotomy was closed with 0 chromic in a running locked fashion, and an imbricating layer was also placed with 0 chromic The pelvis was cleared of all clot and debris. Hemostasis was confirmed on all surfaces.  The adhesion was transected, and reperitonealized to try to prevent recurrence  The peritoneum and the muscles were reapproximated using 0 Vicryl interrupted stitches. The fascia was then closed using 0 Vicryl in a running fashion.  The subcutaneous layer was irrigated,   The skin was closed with a 4-0 Vicryl subcuticular stitch. The patient tolerated the procedure well. Sponge, lap, instrument and needle counts were correct x 2.  She was taken to the recovery room in stable condition.

## 2013-07-28 NOTE — Anesthesia Postprocedure Evaluation (Signed)
  Anesthesia Post Note  Patient: Kaylee Miller  Procedure(s) Performed: Procedure(s) (LRB): CESAREAN SECTION (N/A)  Anesthesia type: Spinal  Patient location: PACU  Post pain: Pain level controlled  Post assessment: Post-op Vital signs reviewed  Last Vitals:  Filed Vitals:   07/28/13 1115  BP: 116/49  Pulse: 65  Temp:   Resp: 16    Post vital signs: Reviewed  Level of consciousness: awake  Complications: No apparent anesthesia complications

## 2013-07-28 NOTE — Brief Op Note (Signed)
07/28/2013  10:49 AM  PATIENT:  Kaylee Miller  22 y.o. female  PRE-OPERATIVE DIAGNOSIS:  Previous cesarean section [V45.89] not for tolac  POST-OPERATIVE DIAGNOSIS:  Previous cesarean section [V45.89] not for tolac  PROCEDURE:  Procedure(s): CESAREAN SECTION (N/A) Repeat low transverse SURGEON:  Surgeon(s) and Role:    * [name], MD - Primary,  PHYSICIAN ASSISTANT:  Reola Calkins, MD  ASSISTANTSGabriel Rung , PA   ANESTHESIA:   spinal  EBL:  Total I/O In: 3000 [I.V.:3000] Out: 1100 [Urine:100; Blood:1000]  BLOOD ADMINISTERED:none  DRAINS: Urinary Catheter (Foley)   LOCAL MEDICATIONS USED:  XYLOCAINE   SPECIMEN:  Source of Specimen:  placenta toLl& D  DISPOSITION OF SPECIMEN:  l&D  COUNTS:  YES  TOURNIQUET:  * No tourniquets in log *  DICTATION: .Dragon Dictation  PLAN OF CARE: Admit to inpatient   PATIENT DISPOSITION:  PACU - hemodynamically stable.   Delay start of Pharmacological VTE agent (>24hrs) due to surgical blood loss or risk of bleeding: not applicable

## 2013-07-28 NOTE — H&P (Signed)
Kaylee Miller is a 22 y.o. female presenting for repeat Cesarean section.Patient plans Depo provera for future contraception. Pt Declined TOLAC earlier at November appt. History OB History   Grav Para Term Preterm Abortions TAB SAB Ect Mult Living   2 1 1       1      Past Medical History  Diagnosis Date  . Seasonal allergies   . Headache(784.0)     migraines  . HSV-2 (herpes simplex virus 2) infection   . History of bronchitis   . Pregnant    Past Surgical History  Procedure Laterality Date  . Adenoids    . Cesarean section  2007  . Tubes in ears     Family History: family history includes CAD in her maternal grandfather, other, paternal grandfather, and paternal grandmother; Cancer in her mother; Diabetes in her other; Hypertension in her father. Social History:  reports that she has quit smoking. Her smoking use included Cigarettes. She has a 6 pack-year smoking history. She has never used smokeless tobacco. She reports that she does not drink alcohol or use illicit drugs.   Prenatal Transfer Tool  Maternal Diabetes: No Genetic Screening: Normal Maternal Ultrasounds/Referrals: Normal Fetal Ultrasounds or other Referrals:  None Maternal Substance Abuse:  No Significant Maternal Medications:  None Significant Maternal Lab Results:  None Other Comments:  None  ROS    Blood pressure 119/78, pulse 83, temperature 98.6 F (37 C), resp. rate 18, last menstrual period 10/31/2012, SpO2 100.00%. Exam Physical Exam  Constitutional: She is oriented to person, place, and time. She appears well-developed and well-nourished.  HENT:  Head: Normocephalic.  Eyes: Pupils are equal, round, and reactive to light.  Neck: Normal range of motion. Neck supple.  Cardiovascular: Normal rate and regular rhythm.   Respiratory: Effort normal.  GI: Soft. Bowel sounds are normal.  Genitourinary: Vagina normal.  Musculoskeletal: Normal range of motion.  Neurological: She is alert and  oriented to person, place, and time. She has normal reflexes.  Psychiatric: She has a normal mood and affect. Her behavior is normal. Judgment and thought content normal.    Prenatal labs: ABO, Rh: --/--/A POS (12/03 1500) Antibody: NEG (12/03 1500) Rubella: 1.34 (05/15 1254) RPR: NON REACTIVE (12/03 1500)  HBsAg: NEGATIVE (05/15 1254)  HIV: NON REACTIVE (09/02 0930)  GBS: NEGATIVE (11/17 1227)   Assessment/Plan: Pregnancy 39 weeks, prior cesarean for repeat cesarean this morning, risks of procedure, alternatives , probable and potential complications discussed.    Repeat Cesarean this morning   Kitiara Hintze V 07/28/2013, 8:43 AM

## 2013-07-28 NOTE — Anesthesia Preprocedure Evaluation (Signed)
Anesthesia Evaluation  Patient identified by MRN, date of birth, ID band Patient awake    Reviewed: Allergy & Precautions, H&P , NPO status , Patient's Chart, lab work & pertinent test results  Airway Mallampati: II      Dental   Pulmonary former smoker,  breath sounds clear to auscultation        Cardiovascular Exercise Tolerance: Good Rhythm:regular Rate:Normal     Neuro/Psych  Headaches,    GI/Hepatic   Endo/Other    Renal/GU      Musculoskeletal   Abdominal   Peds  Hematology  (+) anemia ,   Anesthesia Other Findings   Reproductive/Obstetrics (+) Pregnancy                           Anesthesia Physical Anesthesia Plan  ASA: II  Anesthesia Plan: Spinal   Post-op Pain Management:    Induction:   Airway Management Planned:   Additional Equipment:   Intra-op Plan:   Post-operative Plan:   Informed Consent: I have reviewed the patients History and Physical, chart, labs and discussed the procedure including the risks, benefits and alternatives for the proposed anesthesia with the patient or authorized representative who has indicated his/her understanding and acceptance.     Plan Discussed with: Anesthesiologist, CRNA and Surgeon  Anesthesia Plan Comments:         Anesthesia Quick Evaluation

## 2013-07-28 NOTE — Anesthesia Procedure Notes (Signed)
Spinal  Patient location during procedure: OR Start time: 07/28/2013 9:22 AM End time: 07/28/2013 9:26 AM Staffing Anesthesiologist: Leilani Able Performed by: anesthesiologist  Preanesthetic Checklist Completed: patient identified, surgical consent, pre-op evaluation, timeout performed, IV checked, risks and benefits discussed and monitors and equipment checked Spinal Block Patient position: sitting Prep: DuraPrep Patient monitoring: heart rate, cardiac monitor, continuous pulse ox and blood pressure Approach: midline Location: L3-4 Injection technique: single-shot Needle Needle type: Sprotte and Pencan  Needle gauge: 24 G Needle length: 10 cm Needle insertion depth: 8 cm Assessment Sensory level: T4

## 2013-07-28 NOTE — Transfer of Care (Signed)
Immediate Anesthesia Transfer of Care Note  Patient: Kaylee Miller  Procedure(s) Performed: Procedure(s): CESAREAN SECTION (N/A)  Patient Location: PACU  Anesthesia Type:Spinal  Level of Consciousness: awake, alert  and oriented  Airway & Oxygen Therapy: Patient Spontanous Breathing  Post-op Assessment: Report given to PACU RN and Post -op Vital signs reviewed and stable  Post vital signs: stable  Complications: No apparent anesthesia complications

## 2013-07-29 ENCOUNTER — Encounter (HOSPITAL_COMMUNITY): Payer: Self-pay | Admitting: Obstetrics and Gynecology

## 2013-07-29 LAB — CBC
HCT: 30.2 % — ABNORMAL LOW (ref 36.0–46.0)
Hemoglobin: 9.9 g/dL — ABNORMAL LOW (ref 12.0–15.0)
MCHC: 32.8 g/dL (ref 30.0–36.0)
MCV: 84.8 fL (ref 78.0–100.0)
RBC: 3.56 MIL/uL — ABNORMAL LOW (ref 3.87–5.11)
RDW: 14.6 % (ref 11.5–15.5)
WBC: 7.9 10*3/uL (ref 4.0–10.5)

## 2013-07-29 NOTE — Progress Notes (Signed)
Post Partum Day 1 Subjective: no complaints, up ad lib, voiding and tolerating PO  Objective: Blood pressure 120/68, pulse 92, temperature 98.5 F (36.9 C), temperature source Oral, resp. rate 20, height 5\' 7"  (1.702 m), weight 91.627 kg (202 lb), last menstrual period 10/31/2012, SpO2 97.00%, unknown if currently breastfeeding.  Physical Exam:  General: alert, cooperative and no distress Lochia: appropriate Uterine Fundus: firm Incision: Dressing clean, dry, with drainage marked without additional drainage in a few hours DVT Evaluation: No evidence of DVT seen on physical exam. Negative Homan's sign. No cords or calf tenderness. No significant calf/ankle edema.   Recent Labs  07/27/13 1500 07/29/13 0550  HGB 12.2 9.9*  HCT 36.3 30.2*    Assessment/Plan: Plan for discharge tomorrow, Breastfeeding, Lactation consult and Contraception Depo   Education provided about LARCs, effectiveness, safety.     LOS: 1 day   LEFTWICH-KIRBY, Jeremiah Tarpley 07/29/2013, 11:11 AM

## 2013-07-29 NOTE — Progress Notes (Signed)
UR chart review completed.  

## 2013-07-29 NOTE — Anesthesia Postprocedure Evaluation (Signed)
  Anesthesia Post-op Note  Patient: Kaylee Miller  Procedure(s) Performed: Procedure(s): CESAREAN SECTION (N/A)  Patient Location: PACU and Mother/Baby  Anesthesia Type:Spinal  Level of Consciousness: awake, alert  and oriented  Airway and Oxygen Therapy: Patient Spontanous Breathing  Post-op Pain: mild  Post-op Assessment: Patient's Cardiovascular Status Stable, Respiratory Function Stable, No signs of Nausea or vomiting and Pain level controlled  Post-op Vital Signs: stable  Complications: No apparent anesthesia complications

## 2013-07-30 MED ORDER — IBUPROFEN 800 MG PO TABS: 800.0000 mg | ORAL_TABLET | Freq: Three times a day (TID) | ORAL | Status: DC | PRN

## 2013-07-30 MED ORDER — OXYCODONE-ACETAMINOPHEN 7.5-325 MG PO TABS: 1.0000 | ORAL_TABLET | Freq: Four times a day (QID) | ORAL | Status: DC | PRN

## 2013-07-30 NOTE — Discharge Summary (Signed)
Obstetric Discharge Summary Reason for Admission: cesarean section Prenatal Procedures: ultrasound Intrapartum Procedures: na Postpartum Procedures: none Complications-Operative and Postpartum: none Hemoglobin  Date Value Range Status  07/29/2013 9.9* 12.0 - 15.0 g/dL Final     DELTA CHECK NOTED     REPEATED TO VERIFY     HCT  Date Value Range Status  07/29/2013 30.2* 36.0 - 46.0 % Final    Physical Exam:  General: alert, cooperative and no distress Lochia: appropriate Uterine Fundus: firm Incision: healing well, no significant drainage, no dehiscence, no significant erythema DVT Evaluation: No evidence of DVT seen on physical exam.  Discharge Diagnoses: Term Pregnancy-delivered  Discharge Information: Date: 07/30/2013 Activity: pelvic rest Diet: routine Medications: Ibuprofen and Percocet Condition: stable Instructions: refer to practice specific booklet Discharge to: home Follow-up Information   Follow up with [name], MD. Schedule an appointment as soon as possible for a visit on 08/05/2013.   Specialties:  Obstetrics and Gynecology, Radiology   Contact information:   [address] [place] [address] [phone]       Newborn Data: Live born female  Birth Weight: 7 lb 12.2 oz (3520 g) APGAR: 9, 9  Home with mother.  Kaylee Miller 07/30/2013, 9:53 AM

## 2013-08-01 ENCOUNTER — Encounter: Payer: Medicaid Other | Admitting: Women's Health

## 2013-08-01 DIAGNOSIS — Z029 Encounter for administrative examinations, unspecified: Secondary | ICD-10-CM

## 2013-08-04 ENCOUNTER — Encounter (INDEPENDENT_AMBULATORY_CARE_PROVIDER_SITE_OTHER): Payer: Self-pay

## 2013-08-04 ENCOUNTER — Encounter: Payer: Self-pay | Admitting: Advanced Practice Midwife

## 2013-08-04 ENCOUNTER — Encounter: Payer: Medicaid Other | Admitting: Advanced Practice Midwife

## 2013-08-04 ENCOUNTER — Ambulatory Visit (INDEPENDENT_AMBULATORY_CARE_PROVIDER_SITE_OTHER): Payer: Medicaid Other | Admitting: Advanced Practice Midwife

## 2013-08-04 VITALS — BP 120/70 | Ht 67.0 in | Wt 196.0 lb

## 2013-08-04 DIAGNOSIS — Z09 Encounter for follow-up examination after completed treatment for conditions other than malignant neoplasm: Secondary | ICD-10-CM

## 2013-08-04 DIAGNOSIS — Z9889 Other specified postprocedural states: Secondary | ICD-10-CM

## 2013-08-04 NOTE — Progress Notes (Signed)
Kaylee Miller 22 y.o. 1 week s/p RLTCS.  Doing well, breastfeeding well. + BM's No C/O.  Incision C/D/I with steri strips in place.  May leave to help cosmetic healing.    F/U 4 weeks PP checkup

## 2013-09-01 ENCOUNTER — Ambulatory Visit: Payer: Medicaid Other

## 2013-09-01 ENCOUNTER — Encounter (INDEPENDENT_AMBULATORY_CARE_PROVIDER_SITE_OTHER): Payer: Self-pay

## 2013-09-01 ENCOUNTER — Ambulatory Visit (INDEPENDENT_AMBULATORY_CARE_PROVIDER_SITE_OTHER): Payer: Medicaid Other | Admitting: Advanced Practice Midwife

## 2013-09-01 ENCOUNTER — Ambulatory Visit (INDEPENDENT_AMBULATORY_CARE_PROVIDER_SITE_OTHER): Payer: Medicaid Other | Admitting: Obstetrics & Gynecology

## 2013-09-01 ENCOUNTER — Encounter: Payer: Self-pay | Admitting: Advanced Practice Midwife

## 2013-09-01 VITALS — BP 110/80 | Ht 67.0 in | Wt 179.0 lb

## 2013-09-01 DIAGNOSIS — Z309 Encounter for contraceptive management, unspecified: Secondary | ICD-10-CM

## 2013-09-01 DIAGNOSIS — Z3049 Encounter for surveillance of other contraceptives: Secondary | ICD-10-CM

## 2013-09-01 DIAGNOSIS — Z3202 Encounter for pregnancy test, result negative: Secondary | ICD-10-CM

## 2013-09-01 LAB — POCT URINE PREGNANCY: PREG TEST UR: NEGATIVE

## 2013-09-01 MED ORDER — MEDROXYPROGESTERONE ACETATE 150 MG/ML IM SUSP
150.0000 mg | Freq: Once | INTRAMUSCULAR | Status: AC
  Administered 2013-09-01: 150 mg via INTRAMUSCULAR

## 2013-09-01 MED ORDER — MEDROXYPROGESTERONE ACETATE 150 MG/ML IM SUSP: 150.0000 mg | INTRAMUSCULAR | Status: DC

## 2013-09-01 NOTE — Patient Instructions (Signed)
Intrauterine Device Information An intrauterine device (IUD) is inserted into your uterus to prevent pregnancy. There are two types of IUDs available:   Copper IUD This type of IUD is wrapped in copper wire and is placed inside the uterus. Copper makes the uterus and fallopian tubes produce a fluid that kills sperm. The copper IUD can stay in place for 10 years.  Hormone IUD This type of IUD contains the hormone progestin (synthetic progesterone). The hormone thickens the cervical mucus and prevents sperm from entering the uterus. It also thins the uterine lining to prevent implantation of a fertilized egg. The hormone can weaken or kill the sperm that get into the uterus. One type of hormone IUD can stay in place for 5 years, and another type can stay in place for 3 years. Your health care provider will make sure you are a good candidate for a contraceptive IUD. Discuss with your health care provider the possible side effects.  ADVANTAGES OF AN INTRAUTERINE DEVICE  IUDs are highly effective, reversible, long acting, and low maintenance.   There are no estrogen-related side effects.   An IUD can be used when breastfeeding.   IUDs are not associated with weight gain.   The copper IUD works immediately after insertion.   The hormone IUD works right away if inserted within 7 days of your period starting. You will need to use a backup method of birth control for 7 days if the hormone IUD is inserted at any other time in your cycle.  The copper IUD does not interfere with your female hormones.   The hormone IUD can make heavy menstrual periods lighter and decrease cramping.   The hormone IUD can be used for 3 or 5 years.   The copper IUD can be used for 10 years. DISADVANTAGES OF AN INTRAUTERINE DEVICE  The hormone IUD can be associated with irregular bleeding patterns.   The copper IUD can make your menstrual flow heavier and more painful.   You may experience cramping and  vaginal bleeding after insertion.  Document Released: 07/15/2004 Document Revised: 04/13/2013 Document Reviewed: 01/30/2013 [employer] Patient Information 2014 [employer].  

## 2013-09-01 NOTE — Progress Notes (Signed)
  Kaylee Miller is a 23 y.o. who presents for a postpartum visit. She is 4 weeks postpartum following a low cervical transverse Cesarean section. I have fully reviewed the prenatal and intrapartum course. The delivery was at 45 gestational weeks.  Anesthesia: spinal. Postpartum course has been uneventful. Baby's course has been uneventful. Baby is feeding by bottle feeding now.  BF for a few weeksw. And baby had stomach problems. Bleeding: no bleeding. Bowel function is normal. Bladder function is normal. Patient is sexually active. Contraception method is condoms. Postpartum depression screening: negative.    Review of Systems   Constitutional: Negative for fever and chills Eyes: Negative for visual disturbances Respiratory: Negative for shortness of breath, dyspnea Cardiovascular: Negative for chest pain or palpitations  Gastrointestinal: Negative for vomiting, diarrhea and constipation Genitourinary: Negative for dysuria and urgency Musculoskeletal: Negative for back pain, joint pain, myalgias  Neurological: Negative for dizziness and headaches   Objective:     Filed Vitals:   09/01/13 1153  BP: 110/80   General:  alert, cooperative and no distress   Breasts:  negative  Lungs: clear to auscultation bilaterally  Heart:  regular rate and rhythm  Abdomen: Soft, nontender   Vulva:  normal  Vagina: normal vagina  Cervix:  closed  Corpus: Well involuted     Rectal Exam: no hemorrhoids        Assessment:    normal postpartum exam.  Plan:    1. Contraception: Depo-Provera injections 2. Follow up in: asap for depo or as needed.  Plans Mirena in a few months 3.  May go back to work on light duty

## 2013-11-09 ENCOUNTER — Ambulatory Visit: Payer: Medicaid Other | Admitting: Advanced Practice Midwife

## 2013-11-10 ENCOUNTER — Ambulatory Visit: Payer: Medicaid Other | Admitting: Advanced Practice Midwife

## 2013-11-15 ENCOUNTER — Other Ambulatory Visit: Payer: Medicaid Other | Admitting: Women's Health

## 2013-11-23 ENCOUNTER — Other Ambulatory Visit: Payer: Medicaid Other | Admitting: Women's Health

## 2013-11-23 ENCOUNTER — Encounter (HOSPITAL_COMMUNITY): Payer: Self-pay | Admitting: Emergency Medicine

## 2013-11-23 ENCOUNTER — Emergency Department (HOSPITAL_COMMUNITY)
Admission: EM | Admit: 2013-11-23 | Discharge: 2013-11-23 | Disposition: A | Discharge status: Home / Self Care | Payer: Medicaid Other | Attending: Emergency Medicine | Admitting: Emergency Medicine

## 2013-11-23 DIAGNOSIS — F17200 Nicotine dependence, unspecified, uncomplicated: Secondary | ICD-10-CM | POA: Insufficient documentation

## 2013-11-23 DIAGNOSIS — K089 Disorder of teeth and supporting structures, unspecified: Secondary | ICD-10-CM | POA: Insufficient documentation

## 2013-11-23 DIAGNOSIS — Z8619 Personal history of other infectious and parasitic diseases: Secondary | ICD-10-CM | POA: Insufficient documentation

## 2013-11-23 DIAGNOSIS — Z8669 Personal history of other diseases of the nervous system and sense organs: Secondary | ICD-10-CM | POA: Insufficient documentation

## 2013-11-23 DIAGNOSIS — Z91040 Latex allergy status: Secondary | ICD-10-CM | POA: Insufficient documentation

## 2013-11-23 DIAGNOSIS — K0889 Other specified disorders of teeth and supporting structures: Secondary | ICD-10-CM

## 2013-11-23 DIAGNOSIS — Z8709 Personal history of other diseases of the respiratory system: Secondary | ICD-10-CM | POA: Insufficient documentation

## 2013-11-23 MED ORDER — AMOXICILLIN 250 MG PO CAPS
500.0000 mg | ORAL_CAPSULE | Freq: Once | ORAL | Status: AC
  Administered 2013-11-23: 500 mg via ORAL
  Filled 2013-11-23: qty 2

## 2013-11-23 MED ORDER — IBUPROFEN 800 MG PO TABS: 800.0000 mg | ORAL_TABLET | Freq: Three times a day (TID) | ORAL | Status: DC

## 2013-11-23 MED ORDER — KETOROLAC TROMETHAMINE 10 MG PO TABS
10.0000 mg | ORAL_TABLET | Freq: Once | ORAL | Status: AC
  Administered 2013-11-23: 10 mg via ORAL
  Filled 2013-11-23: qty 1

## 2013-11-23 MED ORDER — ACETAMINOPHEN-CODEINE #3 300-30 MG PO TABS: 1.0000 | ORAL_TABLET | Freq: Four times a day (QID) | ORAL | Status: DC | PRN

## 2013-11-23 MED ORDER — ACETAMINOPHEN-CODEINE #3 300-30 MG PO TABS
1.0000 | ORAL_TABLET | Freq: Once | ORAL | Status: DC
  Filled 2013-11-23: qty 1

## 2013-11-23 MED ORDER — AMOXICILLIN 500 MG PO CAPS: 500.0000 mg | ORAL_CAPSULE | Freq: Three times a day (TID) | ORAL | Status: DC

## 2013-11-23 NOTE — ED Provider Notes (Signed)
CSN: [id number]     Arrival date & time 11/23/13  1145 History   First MD Initiated Contact with Patient 11/23/13 1207     Chief Complaint  Patient presents with  . Dental Pain     (Consider location/radiation/quality/duration/timing/severity/associated sxs/prior Treatment) Patient is a 23 y.o. female presenting with tooth pain.  Dental Pain Associated symptoms: no neck pain     Past Medical History  Diagnosis Date  . Seasonal allergies   . Headache(784.0)     migraines  . HSV-2 (herpes simplex virus 2) infection   . History of bronchitis   . Pregnant    Past Surgical History  Procedure Laterality Date  . Adenoids    . Cesarean section  2007  . Tubes in ears    . Cesarean section N/A 07/28/2013    Procedure: CESAREAN SECTION;  Surgeon: Jonnie Kind, MD;  Location: [facility];  Service: Obstetrics;  Laterality: N/A;   Family History  Problem Relation Age of Onset  . Diabetes Other   . CAD Other   . Cancer Mother   . Hypertension Father   . CAD Maternal Grandfather   . CAD Paternal Grandmother   . CAD Paternal Grandfather    History  Substance Use Topics  . Smoking status: Current Some Day Smoker -- 1.00 packs/day for 6 years    Types: Cigarettes  . Smokeless tobacco: Never Used  . Alcohol Use: Yes     Comment: OCC   OB History   Grav Para Term Preterm Abortions TAB SAB Ect Mult Living   2 2 2       2      Review of Systems  Constitutional: Negative for activity change.       All ROS Neg except as noted in HPI  HENT: Positive for dental problem. Negative for nosebleeds.   Eyes: Negative for photophobia and discharge.  Respiratory: Negative for cough, shortness of breath and wheezing.   Cardiovascular: Negative for chest pain and palpitations.  Gastrointestinal: Negative for abdominal pain and blood in stool.  Genitourinary: Negative for dysuria, frequency and hematuria.  Musculoskeletal: Negative for arthralgias, back pain and neck pain.  Skin: Negative.    Neurological: Negative for dizziness, seizures and speech difficulty.  Psychiatric/Behavioral: Negative for hallucinations and confusion.      Allergies  Latex  Home Medications   Current Outpatient Rx  Name  Route  Sig  Dispense  Refill  . Aspirin-Acetaminophen-Caffeine (GOODYS EXTRA STRENGTH) 500-325-65 MG PACK   Oral   Take 1 packet by mouth every 6 (six) hours as needed. pain         . medroxyPROGESTERone (DEPO-PROVERA) 150 MG/ML injection   Intramuscular   Inject 1 mL (150 mg total) into the muscle every 3 (three) months.   1 mL   3    BP 110/56  Pulse 97  Temp(Src) 98.9 F (37.2 C) (Oral)  Resp 18  Ht 5\' 7"  (1.702 m)  Wt 179 lb (81.194 kg)  BMI 28.03 kg/m2  SpO2 100%  LMP 11/23/2013 Physical Exam  Nursing note and vitals reviewed. Constitutional: She is oriented to person, place, and time. She appears well-developed and well-nourished.  Non-toxic appearance.  HENT:  Head: Normocephalic.  Right Ear: Tympanic membrane and external ear normal.  Left Ear: Tympanic membrane and external ear normal.  Is a cavity in a will he the left upper second molar. There is mild swelling around the gum. No visible abscess present. There is pain to palpation  of the molar and the one in front of. The airway is patent.  Eyes: EOM and lids are normal. Pupils are equal, round, and reactive to light.  Neck: Normal range of motion. Neck supple. Carotid bruit is not present.  Cardiovascular: Normal rate, regular rhythm, normal heart sounds, intact distal pulses and normal pulses.   Pulmonary/Chest: Breath sounds normal. No respiratory distress.  Abdominal: Soft. Bowel sounds are normal. There is no tenderness. There is no guarding.  Musculoskeletal: Normal range of motion.  Lymphadenopathy:       Head (right side): No submandibular adenopathy present.       Head (left side): No submandibular adenopathy present.    She has no cervical adenopathy.  Neurological: She is alert and  oriented to person, place, and time. She has normal strength. No cranial nerve deficit or sensory deficit.  Skin: Skin is warm and dry.  Psychiatric: She has a normal mood and affect. Her speech is normal.    ED Course  Procedures (including critical care time) Labs Review Labs Reviewed - No data to display Imaging Review No results found.   EKG Interpretation None      MDM Patient hasn't daily with a dental pain on the left side. Vital signs are well within normal limits. There is no compromise of the airway. There is no evidence for Ludwig's angina.  Patient will be treated with amoxicillin, ibuprofen 800 mg, and Tylenol with Codeine. Patient given dental resources.    Final diagnoses:  None    **I have reviewed nursing notes, vital signs, and all appropriate lab and imaging results for this patient.Lenox Ahr, PA-C 11/23/13 1240

## 2013-11-23 NOTE — Discharge Instructions (Signed)
It is important that you see a dentist as sone as possible. Please use the amoxicillin and ibuprofen 800 mg daily until all taken. May use Tylenol codeine for more severe pain. Please take this medication with food. This medication may cause drowsiness, please use with caution. Toothache Toothaches are usually caused by tooth decay (cavity). However, other causes of toothache include:  Gum disease.  Cracked tooth.  Cracked filling.  Injury.  Jaw problem (temporo mandibular joint or TMJ disorder).  Tooth abscess.  Root sensitivity.  Grinding.  Eruption problems. Swelling and redness around a painful tooth often means you have a dental abscess. Pain medicine and antibiotics can help reduce symptoms, but you will need to see a dentist within the next few days to have your problem properly evaluated and treated. If tooth decay is the problem, you may need a filling or root canal to save your tooth. If the problem is more severe, your tooth may need to be pulled. SEEK IMMEDIATE MEDICAL CARE IF:  You cannot swallow.  You develop severe swelling, increased redness, or increased pain in your mouth or face.  You have a fever.  You cannot open your mouth adequately. Document Released: 09/18/2004 Document Revised: 11/03/2011 Document Reviewed: 11/08/2009 [facility] Patient Information 2014 [place], [place].

## 2013-11-23 NOTE — ED Notes (Signed)
Dental pain x 2 days

## 2013-11-23 NOTE — ED Provider Notes (Signed)
Medical screening examination/treatment/procedure(s) were performed by non-physician practitioner and as supervising physician I was immediately available for consultation/collaboration.   EKG Interpretation None        Sharyon Cable, MD 11/23/13 [phone]

## 2013-12-06 ENCOUNTER — Encounter: Payer: Self-pay | Admitting: Women's Health

## 2013-12-06 ENCOUNTER — Other Ambulatory Visit (HOSPITAL_COMMUNITY)
Admission: RE | Admit: 2013-12-06 | Discharge: 2013-12-06 | Disposition: A | Discharge status: Home / Self Care | Payer: Medicaid Other | Source: Ambulatory Visit | Attending: Obstetrics & Gynecology | Admitting: Obstetrics & Gynecology

## 2013-12-06 ENCOUNTER — Ambulatory Visit (INDEPENDENT_AMBULATORY_CARE_PROVIDER_SITE_OTHER): Payer: Medicaid Other | Admitting: Women's Health

## 2013-12-06 VITALS — BP 120/72 | Ht 67.0 in | Wt 172.2 lb

## 2013-12-06 DIAGNOSIS — Z01419 Encounter for gynecological examination (general) (routine) without abnormal findings: Secondary | ICD-10-CM | POA: Insufficient documentation

## 2013-12-06 DIAGNOSIS — Z3009 Encounter for other general counseling and advice on contraception: Secondary | ICD-10-CM

## 2013-12-06 DIAGNOSIS — N926 Irregular menstruation, unspecified: Secondary | ICD-10-CM

## 2013-12-06 DIAGNOSIS — Z3049 Encounter for surveillance of other contraceptives: Secondary | ICD-10-CM

## 2013-12-06 DIAGNOSIS — F17200 Nicotine dependence, unspecified, uncomplicated: Secondary | ICD-10-CM

## 2013-12-06 DIAGNOSIS — Z113 Encounter for screening for infections with a predominantly sexual mode of transmission: Secondary | ICD-10-CM | POA: Insufficient documentation

## 2013-12-06 LAB — POCT HEMOGLOBIN: Hemoglobin: 13.4 g/dL (ref 12.2–16.2)

## 2013-12-06 NOTE — Progress Notes (Signed)
Patient ID: Kaylee Miller, female   DOB: [birth year], 23 y.o.   MRN: [id number] Subjective:     Kaylee Miller is a 23 y.o. G40P2002 African American  female 72mths s/p RLTCS here for a routine FP Mcaid well-woman exam.  Patient's last menstrual period was 11/23/2013.  Current complaints: irregular bleeding, not currently on contraception. Wants depo. Hasn't had sex since January- her partner is in jail and will be getting out in July Smoking Status: 1ppd  The following portions of the patient's history were reviewed and updated as appropriate: allergies, current medications, past family history, past medical history, past social history, past surgical history and problem list.   Gynecologic History Patient's last menstrual period was 11/23/2013. Contraception: none and desires depo Last Pap: 06/03/12. Results were: normal Last mammogram: never. Results were: n/a  Obstetric History OB History  Gravida Para Term Preterm AB SAB TAB Ectopic Multiple Living  2 2 2       2     # Outcome Date GA Lbr Len/2nd Weight Sex Delivery Anes PTL Lv  2 TRM 07/28/13 [redacted]w[redacted]d  7 lb 12.2 oz (3.52 kg) F LTCS Spinal  Y  1 TRM 06/11/06 [redacted]w[redacted]d  8 lb 3.6 oz (3.731 kg) M LTCS EPI  Y     Comments: failed IOL (elective IOL), OP position      Review of Systems Patient denies any headaches, blurred vision, shortness of breath, chest pain, abdominal pain, problems with bowel movements, urination, or intercourse.      Objective:     Physical Exam  BP 120/72  Ht 5\' 7"  (1.702 m)  Wt 172 lb 3.2 oz (78.109 kg)  BMI 26.96 kg/m2  LMP 11/23/2013  Breastfeeding? No  General:  Well developed, well nourished, no acute distress. She is alert and oriented x3. Skin:  Warm and dry Neck:  Midline trachea, no thyromegaly or nodules Cardiovascular: Regular rate and rhythm, no murmur heard Lungs:  Effort normal, all lung fields clear to auscultation bilaterally Breasts:  No dominant palpable mass, retraction, or nipple  discharge Abdomen:  Soft, non tender, no hepatosplenomegaly or masses Pelvic:  External genitalia is normal in appearance.  The vagina is normal in appearance. The cervix is bulbous, no CMT.  Thin prep pap is done w/ reflex HR HPV cotesting. Uterus is felt to be normal size, shape, and contour.  No adnexal masses or tenderness noted. Extremities:  No swelling or varicosities noted Psych:  She has a normal mood and affect      Assessment:     Healthy FP Mcaid well-woman exam Irregular bleeding Contraception counseling STD screening Smoker     Plan:  GC/CH from pap, HIV, RPR, HSV2 today  Advise smoking cessation Already has depo rx F/U Thurs per her preference for depo administration, no sex until after depo given Mammogram @25yo  or sooner if problems Colonoscopy @25yo  or sooner if problems   Stark, [facility] 12/06/2013 2:39 PM

## 2013-12-06 NOTE — Patient Instructions (Signed)
No sex until after Depo  Medroxyprogesterone injection [Contraceptive]-Depo What is this medicine? MEDROXYPROGESTERONE (me DROX ee proe JES te rone) contraceptive injections prevent pregnancy. They provide effective birth control for 3 months. Depo-subQ Provera 104 is also used for treating pain related to endometriosis. This medicine may be used for other purposes; ask your health care provider or pharmacist if you have questions. COMMON BRAND NAME(S): Depo-Provera, Depo-subQ Provera 104 What should I tell my health care provider before I take this medicine? They need to know if you have any of these conditions: -frequently drink alcohol -asthma -blood vessel disease or a history of a blood clot in the lungs or legs -bone disease such as osteoporosis -breast cancer -diabetes -eating disorder (anorexia nervosa or bulimia) -high blood pressure -HIV infection or AIDS -kidney disease -liver disease -mental depression -migraine -seizures (convulsions) -stroke -tobacco smoker -vaginal bleeding -an unusual or allergic reaction to medroxyprogesterone, other hormones, medicines, foods, dyes, or preservatives -pregnant or trying to get pregnant -breast-feeding How should I use this medicine? Depo-Provera Contraceptive injection is given into a muscle. Depo-subQ Provera 104 injection is given under the skin. These injections are given by a health care professional. You must not be pregnant before getting an injection. The injection is usually given during the first 5 days after the start of a menstrual period or 6 weeks after delivery of a baby. Talk to your pediatrician regarding the use of this medicine in children. Special care may be needed. These injections have been used in female children who have started having menstrual periods. Overdosage: If you think you have taken too much of this medicine contact a poison control center or emergency room at once. NOTE: This medicine is only for  you. Do not share this medicine with others. What if I miss a dose? Try not to miss a dose. You must get an injection once every 3 months to maintain birth control. If you cannot keep an appointment, call and reschedule it. If you wait longer than 13 weeks between Depo-Provera contraceptive injections or longer than 14 weeks between Depo-subQ Provera 104 injections, you could get pregnant. Use another method for birth control if you miss your appointment. You may also need a pregnancy test before receiving another injection. What may interact with this medicine? Do not take this medicine with any of the following medications: -bosentan This medicine may also interact with the following medications: -aminoglutethimide -antibiotics or medicines for infections, especially rifampin, rifabutin, rifapentine, and griseofulvin -aprepitant -barbiturate medicines such as phenobarbital or primidone -bexarotene -carbamazepine -medicines for seizures like ethotoin, felbamate, oxcarbazepine, phenytoin, topiramate -modafinil -St. John's wort This list may not describe all possible interactions. Give your health care provider a list of all the medicines, herbs, non-prescription drugs, or dietary supplements you use. Also tell them if you smoke, drink alcohol, or use illegal drugs. Some items may interact with your medicine. What should I watch for while using this medicine? This drug does not protect you against HIV infection (AIDS) or other sexually transmitted diseases. Use of this product may cause you to lose calcium from your bones. Loss of calcium may cause weak bones (osteoporosis). Only use this product for more than 2 years if other forms of birth control are not right for you. The longer you use this product for birth control the more likely you will be at risk for weak bones. Ask your health care professional how you can keep strong bones. You may have a change in bleeding pattern or irregular  periods. Many females stop having periods while taking this drug. If you have received your injections on time, your chance of being pregnant is very low. If you think you may be pregnant, see your health care professional as soon as possible. Tell your health care professional if you want to get pregnant within the next year. The effect of this medicine may last a long time after you get your last injection. What side effects may I notice from receiving this medicine? Side effects that you should report to your doctor or health care professional as soon as possible: -allergic reactions like skin rash, itching or hives, swelling of the face, lips, or tongue -breast tenderness or discharge -breathing problems -changes in vision -depression -feeling faint or lightheaded, falls -fever -pain in the abdomen, chest, groin, or leg -problems with balance, talking, walking -unusually weak or tired -yellowing of the eyes or skin Side effects that usually do not require medical attention (report to your doctor or health care professional if they continue or are bothersome): -acne -fluid retention and swelling -headache -irregular periods, spotting, or absent periods -temporary pain, itching, or skin reaction at site where injected -weight gain This list may not describe all possible side effects. Call your doctor for medical advice about side effects. You may report side effects to FDA at 1-800-FDA-1088. Where should I keep my medicine? This does not apply. The injection will be given to you by a health care professional. NOTE: This sheet is a summary. It may not cover all possible information. If you have questions about this medicine, talk to your doctor, pharmacist, or health care provider.  2014, [employer]vier/Gold Standard. (2008-09-01 18:37:56)

## 2013-12-07 LAB — HSV 2 ANTIBODY, IGG: HSV 2 GLYCOPROTEIN G AB, IGG: 5.09 IV — AB

## 2013-12-07 LAB — RPR

## 2013-12-07 LAB — HIV ANTIBODY (ROUTINE TESTING W REFLEX): HIV 1&2 Ab, 4th Generation: NONREACTIVE

## 2013-12-08 ENCOUNTER — Ambulatory Visit: Payer: Medicaid Other

## 2013-12-12 ENCOUNTER — Ambulatory Visit (INDEPENDENT_AMBULATORY_CARE_PROVIDER_SITE_OTHER): Payer: Medicaid Other | Admitting: Adult Health

## 2013-12-12 ENCOUNTER — Encounter: Payer: Self-pay | Admitting: Adult Health

## 2013-12-12 VITALS — BP 120/74 | Ht 67.0 in | Wt 172.0 lb

## 2013-12-12 DIAGNOSIS — Z3049 Encounter for surveillance of other contraceptives: Secondary | ICD-10-CM

## 2013-12-12 DIAGNOSIS — Z3200 Encounter for pregnancy test, result unknown: Secondary | ICD-10-CM

## 2013-12-12 DIAGNOSIS — Z309 Encounter for contraceptive management, unspecified: Secondary | ICD-10-CM

## 2013-12-12 LAB — POCT URINE PREGNANCY: Preg Test, Ur: NEGATIVE

## 2013-12-12 MED ORDER — MEDROXYPROGESTERONE ACETATE 150 MG/ML IM SUSP
150.0000 mg | Freq: Once | INTRAMUSCULAR | Status: AC
  Administered 2013-12-12: 150 mg via INTRAMUSCULAR

## 2014-03-06 ENCOUNTER — Ambulatory Visit (INDEPENDENT_AMBULATORY_CARE_PROVIDER_SITE_OTHER): Payer: Medicaid Other | Admitting: Adult Health

## 2014-03-06 ENCOUNTER — Ambulatory Visit: Payer: Medicaid Other

## 2014-03-06 ENCOUNTER — Encounter: Payer: Self-pay | Admitting: Adult Health

## 2014-03-06 DIAGNOSIS — Z3042 Encounter for surveillance of injectable contraceptive: Secondary | ICD-10-CM

## 2014-03-06 DIAGNOSIS — Z3200 Encounter for pregnancy test, result unknown: Secondary | ICD-10-CM

## 2014-03-06 DIAGNOSIS — Z3049 Encounter for surveillance of other contraceptives: Secondary | ICD-10-CM

## 2014-03-06 LAB — POCT URINE PREGNANCY: PREG TEST UR: NEGATIVE

## 2014-03-06 MED ORDER — MEDROXYPROGESTERONE ACETATE 150 MG/ML IM SUSP
150.0000 mg | Freq: Once | INTRAMUSCULAR | Status: AC
  Administered 2014-03-06: 150 mg via INTRAMUSCULAR

## 2014-05-29 ENCOUNTER — Ambulatory Visit (INDEPENDENT_AMBULATORY_CARE_PROVIDER_SITE_OTHER): Payer: Medicaid Other | Admitting: Adult Health

## 2014-05-29 ENCOUNTER — Encounter: Payer: Self-pay | Admitting: Adult Health

## 2014-05-29 ENCOUNTER — Telehealth: Payer: Self-pay | Admitting: *Deleted

## 2014-05-29 DIAGNOSIS — Z3042 Encounter for surveillance of injectable contraceptive: Secondary | ICD-10-CM

## 2014-05-29 DIAGNOSIS — Z3202 Encounter for pregnancy test, result negative: Secondary | ICD-10-CM

## 2014-05-29 DIAGNOSIS — Z30013 Encounter for initial prescription of injectable contraceptive: Secondary | ICD-10-CM

## 2014-05-29 DIAGNOSIS — Z3049 Encounter for surveillance of other contraceptives: Secondary | ICD-10-CM

## 2014-05-29 LAB — POCT URINE PREGNANCY: Preg Test, Ur: NEGATIVE

## 2014-05-29 MED ORDER — MEDROXYPROGESTERONE ACETATE 150 MG/ML IM SUSP
150.0000 mg | Freq: Once | INTRAMUSCULAR | Status: AC
  Administered 2014-05-29: 150 mg via INTRAMUSCULAR

## 2014-05-29 MED ORDER — MEDROXYPROGESTERONE ACETATE 150 MG/ML IM SUSP: 150.0000 mg | INTRAMUSCULAR | Status: DC

## 2014-05-29 NOTE — Telephone Encounter (Signed)
Pt requesting a Rx for Depo Provera 150 mg to be e-scribed, has an appt today at 4 pm for injection.

## 2014-06-26 ENCOUNTER — Encounter: Payer: Self-pay | Admitting: Adult Health

## 2014-08-02 ENCOUNTER — Emergency Department (HOSPITAL_COMMUNITY)
Admission: EM | Admit: 2014-08-02 | Discharge: 2014-08-03 | Disposition: A | Discharge status: Home / Self Care | Payer: Medicaid Other | Attending: Emergency Medicine | Admitting: Emergency Medicine

## 2014-08-02 ENCOUNTER — Encounter (HOSPITAL_COMMUNITY): Payer: Self-pay | Admitting: Emergency Medicine

## 2014-08-02 ENCOUNTER — Emergency Department (HOSPITAL_COMMUNITY): Payer: Medicaid Other

## 2014-08-02 DIAGNOSIS — J209 Acute bronchitis, unspecified: Secondary | ICD-10-CM | POA: Insufficient documentation

## 2014-08-02 DIAGNOSIS — Z8619 Personal history of other infectious and parasitic diseases: Secondary | ICD-10-CM | POA: Insufficient documentation

## 2014-08-02 DIAGNOSIS — Z8679 Personal history of other diseases of the circulatory system: Secondary | ICD-10-CM | POA: Insufficient documentation

## 2014-08-02 DIAGNOSIS — R0602 Shortness of breath: Secondary | ICD-10-CM

## 2014-08-02 DIAGNOSIS — Z79899 Other long term (current) drug therapy: Secondary | ICD-10-CM | POA: Insufficient documentation

## 2014-08-02 DIAGNOSIS — Z91040 Latex allergy status: Secondary | ICD-10-CM | POA: Insufficient documentation

## 2014-08-02 DIAGNOSIS — Z720 Tobacco use: Secondary | ICD-10-CM | POA: Insufficient documentation

## 2014-08-02 MED ORDER — IPRATROPIUM-ALBUTEROL 0.5-2.5 (3) MG/3ML IN SOLN
3.0000 mL | Freq: Once | RESPIRATORY_TRACT | Status: AC
  Administered 2014-08-03: 3 mL via RESPIRATORY_TRACT
  Filled 2014-08-02: qty 3

## 2014-08-02 MED ORDER — ALBUTEROL SULFATE (2.5 MG/3ML) 0.083% IN NEBU
2.5000 mg | INHALATION_SOLUTION | Freq: Once | RESPIRATORY_TRACT | Status: AC
  Administered 2014-08-03: 2.5 mg via RESPIRATORY_TRACT
  Filled 2014-08-02: qty 3

## 2014-08-02 MED ORDER — BENZONATATE 100 MG PO CAPS
200.0000 mg | ORAL_CAPSULE | Freq: Once | ORAL | Status: AC
  Administered 2014-08-03: 200 mg via ORAL
  Filled 2014-08-02: qty 2

## 2014-08-02 NOTE — ED Provider Notes (Signed)
CSN: [id number]     Arrival date & time 08/02/14  2132 History   First MD Initiated Contact with Patient 08/02/14 2256     Chief Complaint  Patient presents with  . Shortness of Breath     (Consider location/radiation/quality/duration/timing/severity/associated sxs/prior Treatment) HPI  Kaylee Miller is a 23 y.o. female presenting with a several day history of uri type symptoms, describing nasal congestion with clear drainage, cough productive of yellow sputum along with chest tightness which worsens at night along with intermittent episodes of wheezing.  She is a 1/2 ppd smoker but has cut back since ill.  She reports midsternal and upper back burning pain with cough only which reminds her of her last illness with bronchitis years ago.  She has had no fevers, chills, nausea, vomiting, reports minimal sore throat, believing it is only sore due to cough irritation.  She has taken no medicines prior to arrival .    Past Medical History  Diagnosis Date  . Seasonal allergies   . Headache(784.0)     migraines  . HSV-2 (herpes simplex virus 2) infection   . History of bronchitis   . Pregnant    Past Surgical History  Procedure Laterality Date  . Adenoids    . Cesarean section  2007  . Tubes in ears    . Cesarean section N/A 07/28/2013    Procedure: CESAREAN SECTION;  Surgeon: Jonnie Kind, MD;  Location: [facility];  Service: Obstetrics;  Laterality: N/A;   Family History  Problem Relation Age of Onset  . Diabetes Other   . CAD Other   . Cancer Mother   . Hypertension Father   . CAD Maternal Grandfather   . CAD Paternal Grandmother   . CAD Paternal Grandfather    History  Substance Use Topics  . Smoking status: Current Some Day Smoker -- 1.00 packs/day for 6 years    Types: Cigarettes  . Smokeless tobacco: Never Used  . Alcohol Use: No   OB History    Gravida Para Term Preterm AB TAB SAB Ectopic Multiple Living   2 2 2       2      Review of Systems   Constitutional: Negative for fever and chills.  HENT: Positive for congestion, rhinorrhea and sore throat.   Eyes: Negative.   Respiratory: Positive for cough, shortness of breath and wheezing. Negative for chest tightness.   Cardiovascular: Negative for chest pain.  Gastrointestinal: Negative for nausea and abdominal pain.  Genitourinary: Negative.   Musculoskeletal: Negative for joint swelling, arthralgias and neck pain.  Skin: Negative.  Negative for rash and wound.  Neurological: Negative for dizziness, weakness, light-headedness, numbness and headaches.  Psychiatric/Behavioral: Negative.       Allergies  Latex  Home Medications   Prior to Admission medications   Medication Sig Start Date End Date Taking? Authorizing Provider  benzonatate (TESSALON) 100 MG capsule Take 1 capsule (100 mg total) by mouth every 8 (eight) hours. 08/03/14   Evalee Jefferson, PA-C  guaiFENesin-codeine 100-10 MG/5ML syrup Take 5 mLs by mouth every 4 (four) hours as needed for cough. 08/03/14   Evalee Jefferson, PA-C  medroxyPROGESTERone (DEPO-PROVERA) 150 MG/ML injection Inject 1 mL (150 mg total) into the muscle every 3 (three) months. 05/29/14   Estill Dooms, NP   BP 104/52 mmHg  Pulse 97  Temp(Src) 98.3 F (36.8 C) (Oral)  Resp 24  Ht 5\' 7"  (1.702 m)  Wt 176 lb 4.8 oz (79.969 kg)  BMI 27.61 kg/m2  SpO2 100%  LMP 07/17/2014 Physical Exam  Constitutional: She is oriented to person, place, and time. She appears well-developed and well-nourished.  HENT:  Head: Normocephalic and atraumatic.  Right Ear: Tympanic membrane and ear canal normal.  Left Ear: Tympanic membrane and ear canal normal.  Nose: Mucosal edema and rhinorrhea present.  Mouth/Throat: Uvula is midline, oropharynx is clear and moist and mucous membranes are normal. No oropharyngeal exudate, posterior oropharyngeal edema, posterior oropharyngeal erythema or tonsillar abscesses.  Eyes: Conjunctivae are normal.  Cardiovascular: Normal  rate and normal heart sounds.   Pulmonary/Chest: Effort normal. No respiratory distress. She has no wheezes. She has no rales.  No wheeze on exam, but diminished breath sounds throughout. Dry sounding cough.  Abdominal: Soft. There is no tenderness.  Musculoskeletal: Normal range of motion. She exhibits no edema.  Neurological: She is alert and oriented to person, place, and time.  Skin: Skin is warm and dry. No rash noted.  Psychiatric: She has a normal mood and affect.    ED Course  Procedures (including critical care time) Labs Review Labs Reviewed - No data to display  Imaging Review Dg Chest 2 View  08/02/2014   CLINICAL DATA:  Cough, congestion, shortness of breath, and chills for 2 days. History of bronchitis and smoking.  EXAM: CHEST  2 VIEW  COMPARISON:  12/12/2007  FINDINGS: The heart size and mediastinal contours are within normal limits. Both lungs are clear. The visualized skeletal structures are unremarkable.  IMPRESSION: No active cardiopulmonary disease.   Electronically Signed   By: Lucienne Capers M.D.   On: 08/02/2014 21:57     EKG Interpretation   Date/Time:  Wednesday August 02 2014 21:40:36 EST Ventricular Rate:  88 PR Interval:  156 QRS Duration: 84 QT Interval:  338 QTC Calculation: 408 R Axis:   3 Text Interpretation:  Normal sinus rhythm Normal ECG Confirmed by POLLINA   MD, CHRISTOPHER [id number]) on 08/02/2014 10:49:58 PM      MDM   Final diagnoses:  Acute bronchitis, unspecified organism    Patients labs and/or radiological studies were viewed and considered during the medical decision making and disposition process. Pt was given albuterol/atrovent neb , tessalon with improvement in cough and sob, still no wheeze on re-exam.  She was given albuterol mdi, also prescribed tessalon, robitussin ac prn cough.  Rest, increased fluids. Recheck prn if sx worsen.  Discussed smoking cessation.    Evalee Jefferson, PA-C 08/03/14 1345  Maudry Diego,  MD 08/03/14 [phone]

## 2014-08-02 NOTE — ED Notes (Signed)
Pt c/o coughing, sob and chest pain.

## 2014-08-03 ENCOUNTER — Emergency Department (HOSPITAL_COMMUNITY): Payer: Medicaid Other

## 2014-08-03 ENCOUNTER — Encounter (HOSPITAL_COMMUNITY): Payer: Self-pay | Admitting: Emergency Medicine

## 2014-08-03 ENCOUNTER — Emergency Department (HOSPITAL_COMMUNITY)
Admission: EM | Admit: 2014-08-03 | Discharge: 2014-08-03 | Disposition: A | Discharge status: Home / Self Care | Payer: Medicaid Other | Attending: Emergency Medicine | Admitting: Emergency Medicine

## 2014-08-03 DIAGNOSIS — Z91040 Latex allergy status: Secondary | ICD-10-CM | POA: Insufficient documentation

## 2014-08-03 DIAGNOSIS — H938X3 Other specified disorders of ear, bilateral: Secondary | ICD-10-CM | POA: Insufficient documentation

## 2014-08-03 DIAGNOSIS — Z8619 Personal history of other infectious and parasitic diseases: Secondary | ICD-10-CM | POA: Insufficient documentation

## 2014-08-03 DIAGNOSIS — J189 Pneumonia, unspecified organism: Secondary | ICD-10-CM

## 2014-08-03 DIAGNOSIS — Z79899 Other long term (current) drug therapy: Secondary | ICD-10-CM | POA: Insufficient documentation

## 2014-08-03 DIAGNOSIS — J159 Unspecified bacterial pneumonia: Secondary | ICD-10-CM | POA: Insufficient documentation

## 2014-08-03 DIAGNOSIS — J069 Acute upper respiratory infection, unspecified: Secondary | ICD-10-CM | POA: Insufficient documentation

## 2014-08-03 DIAGNOSIS — Z720 Tobacco use: Secondary | ICD-10-CM | POA: Insufficient documentation

## 2014-08-03 LAB — RAPID STREP SCREEN (MED CTR MEBANE ONLY): Streptococcus, Group A Screen (Direct): NEGATIVE

## 2014-08-03 MED ORDER — PREDNISONE 50 MG PO TABS
60.0000 mg | ORAL_TABLET | Freq: Once | ORAL | Status: AC
  Administered 2014-08-03: 60 mg via ORAL
  Filled 2014-08-03 (×2): qty 1

## 2014-08-03 MED ORDER — ALBUTEROL SULFATE HFA 108 (90 BASE) MCG/ACT IN AERS
2.0000 | INHALATION_SPRAY | RESPIRATORY_TRACT | Status: DC | PRN
  Administered 2014-08-03: 2 via RESPIRATORY_TRACT
  Filled 2014-08-03: qty 6.7

## 2014-08-03 MED ORDER — BENZONATATE 100 MG PO CAPS: 100.0000 mg | ORAL_CAPSULE | Freq: Three times a day (TID) | ORAL | Status: DC

## 2014-08-03 MED ORDER — IPRATROPIUM-ALBUTEROL 0.5-2.5 (3) MG/3ML IN SOLN
3.0000 mL | Freq: Once | RESPIRATORY_TRACT | Status: AC
  Administered 2014-08-03: 3 mL via RESPIRATORY_TRACT
  Filled 2014-08-03: qty 3

## 2014-08-03 MED ORDER — GUAIFENESIN-CODEINE 100-10 MG/5ML PO SOLN: 5.0000 mL | ORAL | Status: DC | PRN

## 2014-08-03 MED ORDER — ALBUTEROL SULFATE (2.5 MG/3ML) 0.083% IN NEBU
2.5000 mg | INHALATION_SOLUTION | Freq: Once | RESPIRATORY_TRACT | Status: AC
  Administered 2014-08-03: 2.5 mg via RESPIRATORY_TRACT
  Filled 2014-08-03: qty 3

## 2014-08-03 MED ORDER — SODIUM CHLORIDE 0.9 % IV BOLUS (SEPSIS)
1000.0000 mL | Freq: Once | INTRAVENOUS | Status: AC
Start: 2014-08-03 — End: 2014-08-03
  Administered 2014-08-03: 1000 mL via INTRAVENOUS

## 2014-08-03 MED ORDER — DOXYCYCLINE HYCLATE 100 MG PO TABS: 100.0000 mg | ORAL_TABLET | Freq: Two times a day (BID) | ORAL | Status: DC

## 2014-08-03 NOTE — ED Provider Notes (Signed)
MSE was initiated and I personally evaluated the patient and placed orders (if any) at  8:13 PM on August 03, 2014.  The patient appears stable so that the remainder of the MSE may be completed by another provider.  This young lady was seen here yesterday evening and diagnosed with acute bronchitis.  She was placed on an albuterol MDI for history of wheezing, Tessalon and cough syrup with worsening symptoms today.  She has had increased shortness of breath with wheezing, now productive cough, nasal congestion headache, eye pain and increasing weakness and fatigue.  She endorses purulent sputum with occasional blood streaking.  Chest x-ray last night clear.  On exam patient has significant inspiratory and expiratory wheezes throughout all lung fields.  Wet sounding cough.  Appears fatigued, hypotensive borderline tachycardic.  She would benefit from IV fluids and will probably need multiple neb treatments.  Will moved to the acute side for these treatments.    Evalee Jefferson, PA-C 08/03/14 2018  Francine Graven, DO 08/06/14 [phone]

## 2014-08-03 NOTE — ED Provider Notes (Signed)
CSN: [id number]     Arrival date & time 08/03/14  1809 History   First MD Initiated Contact with Patient 08/03/14 1951     Chief Complaint  Patient presents with  . Cough      HPI Pt was seen at 2130.  Per pt, c/o gradual onset and persistence of constant sore throat, runny/stuffy nose, ears congestion, sinus congestion, and cough for the past 2-3 days. Has been associated with generalized body aches/fatigue. States she occasionally hears herself "wheezing." Pt was evaluated in the ED yesterday for same, dx URI, rx tessalon, robitussin, albuterol MDI. States she "feels worse" today.  Denies fevers, no rash, no CP/SOB, no N/V/D, no abd pain.     Past Medical History  Diagnosis Date  . Seasonal allergies   . Headache(784.0)     migraines  . HSV-2 (herpes simplex virus 2) infection   . History of bronchitis   . Pregnant    Past Surgical History  Procedure Laterality Date  . Adenoids    . Cesarean section  2007  . Tubes in ears    . Cesarean section N/A 07/28/2013    Procedure: CESAREAN SECTION;  Surgeon: Jonnie Kind, MD;  Location: [facility];  Service: Obstetrics;  Laterality: N/A;   Family History  Problem Relation Age of Onset  . Diabetes Other   . CAD Other   . Cancer Mother   . Hypertension Father   . CAD Maternal Grandfather   . CAD Paternal Grandmother   . CAD Paternal Grandfather    History  Substance Use Topics  . Smoking status: Current Some Day Smoker -- 1.00 packs/day for 6 years    Types: Cigarettes  . Smokeless tobacco: Never Used  . Alcohol Use: No   OB History    Gravida Para Term Preterm AB TAB SAB Ectopic Multiple Living   2 2 2       2      Review of Systems ROS: Statement: All systems negative except as marked or noted in the HPI; Constitutional: Negative for fever and +chills, generalized body aches/fatigue. ; ; Eyes: Negative for eye pain, redness and discharge. ; ; ENMT: Negative for hoarseness, +ears congestion, nasal congestion, sinus  pressure and sore throat. ; ; Cardiovascular: Negative for chest pain, palpitations, diaphoresis, dyspnea and peripheral edema. ; ; Respiratory: +cough, wheezing. Negative for stridor. ; ; Gastrointestinal: Negative for nausea, vomiting, diarrhea, abdominal pain, blood in stool, hematemesis, jaundice and rectal bleeding. . ; ; Genitourinary: Negative for dysuria, flank pain and hematuria. ; ; Musculoskeletal: Negative for back pain and neck pain. Negative for swelling and trauma.; ; Skin: Negative for pruritus, rash, abrasions, blisters, bruising and skin lesion.; ; Neuro: Negative for headache, lightheadedness and neck stiffness. Negative for weakness, altered level of consciousness , altered mental status, extremity weakness, paresthesias, involuntary movement, seizure and syncope.      Allergies  Latex  Home Medications   Prior to Admission medications   Medication Sig Start Date End Date Taking? Authorizing Provider  albuterol (PROVENTIL HFA) 108 (90 BASE) MCG/ACT inhaler Inhale 2 puffs into the lungs every 6 (six) hours as needed for wheezing or shortness of breath.   Yes Historical Provider, MD  benzonatate (TESSALON) 100 MG capsule Take 1 capsule (100 mg total) by mouth every 8 (eight) hours. 08/03/14  Yes Almyra Free Idol, PA-C  guaiFENesin-codeine 100-10 MG/5ML syrup Take 5 mLs by mouth every 4 (four) hours as needed for cough. 08/03/14  Yes Evalee Jefferson, PA-C  medroxyPROGESTERone (DEPO-PROVERA) 150 MG/ML injection Inject 1 mL (150 mg total) into the muscle every 3 (three) months. 05/29/14   Estill Dooms, NP   BP 115/73 mmHg  Pulse 101  Temp(Src) 99.7 F (37.6 C) (Oral)  Resp 14  Ht 5\' 7"  (1.702 m)  Wt 176 lb (79.833 kg)  BMI 27.56 kg/m2  SpO2 95%  LMP 07/17/2014 Physical Exam  2135: Physical examination:  Nursing notes reviewed; Vital signs and O2 SAT reviewed;  Constitutional: Well developed, Well nourished, Well hydrated, In no acute distress; Head:  Normocephalic, atraumatic;  Eyes: EOMI, PERRL, No scleral icterus; ENMT: TM's clear bilat. +edemetous nasal turbinates bilat with clear rhinorrhea. Mouth and pharynx without lesions. No tonsillar exudates. No intra-oral edema. No submandibular or sublingual edema. No hoarse voice, no drooling, no stridor. No pain with manipulation of larynx. No trismus. Mouth and pharynx normal, Mucous membranes moist; Neck: Supple, Full range of motion, No lymphadenopathy; Cardiovascular: Regular rate and rhythm, No murmur, rub, or gallop; Respiratory: Breath sounds coarse & equal bilaterally, scattered exp wheezes. No audible wheezing. +moist cough during exam. Speaking full sentences with ease, Normal respiratory effort/excursion; Chest: Nontender, Movement normal; Abdomen: Soft, Nontender, Nondistended, Normal bowel sounds; Genitourinary: No CVA tenderness; Extremities: Pulses normal, No tenderness, No edema, No calf edema or asymmetry.; Neuro: AA&Ox3, Major CN grossly intact.  Speech clear. No gross focal motor or sensory deficits in extremities.; Skin: Color normal, Warm, Dry.   ED Course  Procedures     MDM  MDM Reviewed: previous chart, nursing note and vitals Reviewed previous: x-ray Interpretation: x-ray and labs     Results for orders placed or performed during the hospital encounter of 08/03/14  Rapid strep screen  Result Value Ref Range   Streptococcus, Group A Screen (Direct) NEGATIVE NEGATIVE   Dg Chest 2 View 08/03/2014   CLINICAL DATA:  The patient was diagnosed with acute bronchitis yesterday. Patient complains of worsening cough today.  EXAM: CHEST  2 VIEW  COMPARISON:  None.  FINDINGS: The heart size and mediastinal contours are within normal limits. There is mild hazy asymmetric opacity in the left lung base. There is no pulmonary edema or pleural effusion. The visualized skeletal structures are unremarkable.  IMPRESSION: Mild hazy asymmetric opacity in the left lung base, this could be due to developing early  pneumonia.   Electronically Signed   By: Abelardo Diesel M.D.   On: 08/03/2014 21:37    2250:   Pt has tol PO well while in the ED without N/V.  No stooling while in the ED.  Abd remains benign. Pt states she "feels better" after neb.  NAD, lungs CTA bilat, no wheezing, resps easy, speaking full sentences, Sats 97-100% R/A. Pt states she wants to go home now. Will tx possible infiltrate on CXR with abx; otherwise tx symptomatically. Dx and testing d/w pt and family.  Questions answered.  Verb understanding, agreeable to d/c home with outpt f/u.     Francine Graven, DO 08/06/14 1510

## 2014-08-03 NOTE — Discharge Instructions (Signed)
°Emergency Department Resource Guide °1) Find a Doctor and Pay Out of Pocket °Although you won't have to find out who is covered by your insurance plan, it is a good idea to ask around and get recommendations. You will then need to call the office and see if the doctor you have chosen will accept you as a new patient and what types of options they offer for patients who are self-pay. Some doctors offer discounts or will set up payment plans for their patients who do not have insurance, but you will need to ask so you aren't surprised when you get to your appointment. ° °2) Contact Your Local Health Department °Not all health departments have doctors that can see patients for sick visits, but many do, so it is worth a call to see if yours does. If you don't know where your local health department is, you can check in your phone book. The CDC also has a tool to help you locate your state's health department, and many state websites also have listings of all of their local health departments. ° °3) Find a Walk-in Clinic °If your illness is not likely to be very severe or complicated, you may want to try a walk in clinic. These are popping up all over the country in pharmacies, drugstores, and shopping centers. They're usually staffed by nurse practitioners or physician assistants that have been trained to treat common illnesses and complaints. They're usually fairly quick and inexpensive. However, if you have serious medical issues or chronic medical problems, these are probably not your best option. ° °No Primary Care Doctor: °- Call Health Connect at  832-8000 - they can help you locate a primary care doctor that  accepts your insurance, provides certain services, etc. °- Physician Referral Service- 1-800-533-3463 ° °Chronic Pain Problems: °Organization         Address  Phone   Notes  °[facility]  (336) 297-2271 Patients need to be referred by their primary care doctor.  ° °Medication  Assistance: °Organization         Address  Phone   Notes  °[place] [facility] [address] °[place], [place] [address] (336) 641-8030 --Must be a resident of [place] °-- Must have NO insurance coverage whatsoever (no Medicaid/ Medicare, etc.) °-- The pt. MUST have a primary care doctor that directs their care regularly and follows them in the community °  °[facility]  (866) 331-1348   °[employer]  (888) 892-1162   ° °Agencies that provide inexpensive medical care: °Organization         Address  Phone   Notes  °[facility]  (336) 832-8035   °[facility]    (336) 832-7272   °[facility] [address] °[place], [place] [address] (336) 832-4777   °[facility] [address], °[place] (336) 271-4999   °[facility]    (336) 373-0678   °[facility]    (336) 272-1050   °[facility] ° [address], [place] Phone:  (336) 832-4444, Fax:  (336) 832-4440 Hours of Operation:  9 am - 6 pm, M-F.  Also accepts Medicaid/Medicare and self-pay.  °[facility] ° [address],  Phone: (336) 832-3150, Fax: (336) [phone]. Hours of Operation:  8:30 am - 5:30 pm, M-F.  Also accepts Medicaid and self-pay.  °[facility] [place] [address], [place] Phone: (336) 878-6027   °[facility] [address], [place], [place] (336)723-1848, Ext. 123 Mondays & Thursdays: 7-9 AM.  First 15 patients are seen on a first come, first serve basis. °  ° °Medicaid-accepting [place] Providers: ° °Organization         Address  Phone   Notes  °[facility] [address], [place] (336) 641-2100 Also accepts self-pay patients.  °[facility] [address], [place] ° (336) 856-9996   °[facility] [address], [place]  (336) 288-8857   °[facility] [address] [place] [address], [place] (336) 299-7000   °Veita Bland [address], [place]  ° (336) 373-1557 Only accepts [place] Access Medicaid patients after they have their name applied to their card.  ° °Self-Pay (no insurance) in [place]: ° °Organization         Address  Phone   Notes  °Sickle Cell Patients, [facility] [address], [place] (336) 832-1970   °[facility] [address], [place] (336) 832-4400   °[facility] ° [address], [place] (336) 992-4800   °[facility]/Dr. Osei-Bonsu ° [address] [place] [address], [place] or [address], [place] (336) [phone] Phone number for both [place] and [place] locations is the same.  °[facility] [address], [place] (336) 299-0000   °[facility] [address] [place] [address], [place] or [address] (336) 852-7530 °(336) 878-2260   °[facility] [address], [place] (336) 350-1642, phone; (336) 294-5005, fax Sees patients 1st and 3rd Saturday of every month.  Must not qualify for public or private insurance (i.e. Medicaid, Medicare, [place] Health Choice, Veterans' Benefits) • Household income should be no more than 200% of the poverty level •The clinic cannot treat you if you are pregnant or think you are pregnant • Sexually transmitted diseases are not treated at the clinic.  ° ° °Dental Care: °Organization         Address  Phone  Notes  °[place] [facility] [address],  (336) 641-6152 Accepts children up to age 21 who are enrolled in Medicaid or [place] Health Choice; pregnant women with a Medicaid card; and children who have applied for Medicaid or [place] Health Choice, but were declined, whose parents can pay a reduced fee at time of service.  °[place]  [facility] [place]  [address], [place] (336) 641-7733 Accepts children up to age 21 who are enrolled in Medicaid or [place] Health Choice; pregnant women with a Medicaid card; and children who have applied for Medicaid or [place] Health Choice, but were declined, whose parents can pay a reduced fee at time of service.  °[facility] ° [address],  (336) 641-4533 Patients are seen by appointment only. Walk-ins are not accepted. [facility] will see patients 18 years of age and older. °Monday - Tuesday (8am-5pm) °Most Wednesdays (8:30-5pm) °$30 per visit, cash only  °[facility] ° [address], [place] (336) 641-4533 Patients are seen by appointment only. Walk-ins are not accepted. [facility] will see patients 18 years of age and older. °One   Wednesday Evening (Monthly: Volunteer Based).  $30 per visit, cash only  °[facility]  (919) 537-3737 for adults; Children under age 4, call Graduate Pediatric Dentistry at ([phone]) [phone]. Children aged 4-14, please call (919) [phone] to request a pediatric application. ° Dental services are provided in all areas of dental care including fillings, crowns and bridges, complete and partial dentures, implants, gum treatment, root canals, and extractions. Preventive care is also provided. Treatment is provided to both adults and children. °Patients are selected via a lottery and there is often a waiting list. °  °[facility] [address], °[place] ° (336) 763-8833 [email or web address] °  °[facility] [address], [place], [place] (336)723-1848, Ext. 123 Second and Fourth Thursday of each month, opens at 6:30 AM; Clinic ends at 9 AM.  Patients are seen on a first-come first-served basis, and a limited number are seen during each clinic.  ° °[facility] ° [address], [place], [place] (336) 723-7904    Eligibility Requirements °You must have lived in [place], [place], or [place] counties for at least the last three months. °  You cannot be eligible for state or federal sponsored healthcare insurance, including Veterans Administration, Medicaid, or Medicare. °  You generally cannot be eligible for healthcare insurance through your employer.  °  How to apply: °Eligibility screenings are held every Tuesday and Wednesday afternoon from 1:00 pm until 4:00 pm. You do not need an appointment for the interview!  °[facility] [address], [place], [place] 336-631-2330   °[facility]  336-342-8273   °[facility]  336-703-3100   °[facility]  336-570-6415   ° °Behavioral Health Resources in the Community: °Intensive Outpatient Programs °Organization         Address  Phone  Notes  °[facility] [address], [place], [place] 336-878-6098   °[facility] [address], [place], [place] 336-832-9800   °[facility] [address], [place], [place] ° 336-882-2125   °[facility] [address],  °[place], [place] 1-800-853-5163 or 336-641-4981   °Substance Abuse Resources °Organization         Address  Phone  Notes  °[facility]  336-882-2125   °[facility]  336-784-9470   °[facility]  336-285-9073   °[facility]  336-845-3988   °Residential & Outpatient Substance Abuse Program  1-800-659-3381   °Psychological Services °Organization         Address  Phone  Notes  °[facility]  336- 832-9600   °[facility]  336- 378-7881   °[facility] [address], [place] 1-800-853-5163 or 336-641-4981   ° °Mobile Crisis Teams °Organization         Address  Phone  Notes  °[facility], Mobile Crisis Care Unit  1-877-626-1772   °Assertive °[facility] ° [address]  [place], [place] 336-834-9664   °Sharon DeEsch [address] °[place] 336-554-5454   ° °Self-Help/Support Groups °Organization         Address  Phone             Notes  °[facility]  - variety of support groups  336- 373-1402 Call for more information  °Narcotics Anonymous (NA), [facility] [address], °[place]  2 meetings at this location  ° °  Residential Treatment Programs Organization         Address  Phone  Notes  [facility] [address],    [place]  1-(847) 752-6331   [facility]  [address] [place] [address], [place], [place]   [place], [place] (215) 457-3521 Admissions: 8am-3pm M-F  [facility] [address],    [place], [place] 431-540-0867   [facility] [address] [place], [place], [place]   [facility] [address]  [place], [place]   [facility] Intensive Outpatient [address], [place], [place]   [facility] ([place]  [place], [place] 1-507-591-6672 or 780-154-3085   [facility] [address] [place], [place] Accepts Medicaid  [facility] [address]  [place] 1-317-253-5157 Substance Abuse/Addiction Treatment   [facility] Organization         Address  Phone  Notes  [facility]  (509)134-2807   Domenic Schwab, PhD [address] [place], [place]   203-558-9772 or 541-326-3158   [facility] [place], [place] [phone]   [facility] [address], [place], [place] 780-137-4044 Insurance/Medicaid/sponsorship through [facility] and Families [address]                                    [place], [place] 662-729-9088 [place] [address] [place], [place] (831)737-9751    Dr. Adele Schilder  [phone]   [facility] Dept. 1) [address] [place] 2) [place] 3)  [address], [place] 231-445-4017 [phone]  (336)556-3797   [place] [phone] or (518) [phone] (After Hours)      Take over the counter decongestant (such as sudafed), as directed on packaging, for the next week.  Use over the counter normal saline nasal spray, as instructed in the Emergency Department, several times per day for the next 2 weeks.  Take over the counter tylenol and ibuprofen, as directed on packaging, as needed for discomfort.  Gargle with warm water several times per day to help with discomfort.  May also use over the counter sore throat pain medicines such as chloraseptic or sucrets, as directed on packaging, as needed for discomfort. Use your albuterol inhaler (2 to 4 puffs) every 4 hours for the next 7 days, then as needed for cough, wheezing, or shortness of breath.  Call your regular medical doctor tomorrow morning to schedule a follow up appointment within the next 2 days.  Return to the Emergency Department immediately sooner if worsening.

## 2014-08-03 NOTE — ED Notes (Signed)
Pt seen in ED last night and dx with bronchitis. Pt c/o continued sx and ha. nad noted.

## 2014-08-03 NOTE — Discharge Instructions (Signed)

## 2014-08-03 NOTE — ED Notes (Signed)
EDP at bedside  

## 2014-08-05 LAB — CULTURE, GROUP A STREP

## 2014-08-21 ENCOUNTER — Ambulatory Visit (INDEPENDENT_AMBULATORY_CARE_PROVIDER_SITE_OTHER): Payer: Medicaid Other | Admitting: Women's Health

## 2014-08-21 ENCOUNTER — Encounter: Payer: Self-pay | Admitting: Women's Health

## 2014-08-21 DIAGNOSIS — Z3202 Encounter for pregnancy test, result negative: Secondary | ICD-10-CM

## 2014-08-21 DIAGNOSIS — Z3042 Encounter for surveillance of injectable contraceptive: Secondary | ICD-10-CM

## 2014-08-21 LAB — POCT URINE PREGNANCY: Preg Test, Ur: NEGATIVE

## 2014-08-21 MED ORDER — MEDROXYPROGESTERONE ACETATE 150 MG/ML IM SUSP
150.0000 mg | Freq: Once | INTRAMUSCULAR | Status: AC
  Administered 2014-08-21: 150 mg via INTRAMUSCULAR

## 2014-11-13 ENCOUNTER — Ambulatory Visit: Payer: Medicaid Other

## 2014-11-16 ENCOUNTER — Ambulatory Visit (INDEPENDENT_AMBULATORY_CARE_PROVIDER_SITE_OTHER): Payer: Medicaid Other | Admitting: *Deleted

## 2014-11-16 ENCOUNTER — Encounter: Payer: Self-pay | Admitting: *Deleted

## 2014-11-16 DIAGNOSIS — Z3042 Encounter for surveillance of injectable contraceptive: Secondary | ICD-10-CM

## 2014-11-16 DIAGNOSIS — Z3202 Encounter for pregnancy test, result negative: Secondary | ICD-10-CM

## 2014-11-16 LAB — POCT URINE PREGNANCY: Preg Test, Ur: NEGATIVE

## 2014-11-16 MED ORDER — MEDROXYPROGESTERONE ACETATE 150 MG/ML IM SUSP
150.0000 mg | Freq: Once | INTRAMUSCULAR | Status: AC
  Administered 2014-11-16: 150 mg via INTRAMUSCULAR

## 2014-11-16 NOTE — Progress Notes (Signed)
Pt here for Depo shot. Pt reports having spotting every month. I advised if shot don't stop spotting, let us know. Pt voiced understanding. Return in 12 weeks for next shot. [place]

## 2015-02-08 ENCOUNTER — Encounter: Payer: Self-pay | Admitting: Adult Health

## 2015-02-08 ENCOUNTER — Other Ambulatory Visit (HOSPITAL_COMMUNITY)
Admission: RE | Admit: 2015-02-08 | Discharge: 2015-02-08 | Disposition: A | Discharge status: Home / Self Care | Payer: Medicaid Other | Source: Ambulatory Visit | Attending: Adult Health | Admitting: Adult Health

## 2015-02-08 ENCOUNTER — Ambulatory Visit: Payer: Medicaid Other

## 2015-02-08 ENCOUNTER — Ambulatory Visit (INDEPENDENT_AMBULATORY_CARE_PROVIDER_SITE_OTHER): Payer: Medicaid Other | Admitting: Adult Health

## 2015-02-08 VITALS — BP 128/78 | HR 64 | Ht 67.0 in | Wt 177.0 lb

## 2015-02-08 DIAGNOSIS — Z01419 Encounter for gynecological examination (general) (routine) without abnormal findings: Secondary | ICD-10-CM

## 2015-02-08 DIAGNOSIS — Z309 Encounter for contraceptive management, unspecified: Secondary | ICD-10-CM | POA: Diagnosis not present

## 2015-02-08 DIAGNOSIS — Z113 Encounter for screening for infections with a predominantly sexual mode of transmission: Secondary | ICD-10-CM | POA: Diagnosis present

## 2015-02-08 DIAGNOSIS — Z3042 Encounter for surveillance of injectable contraceptive: Secondary | ICD-10-CM

## 2015-02-08 DIAGNOSIS — Z3040 Encounter for surveillance of contraceptives, unspecified: Secondary | ICD-10-CM

## 2015-02-08 HISTORY — DX: Encounter for contraceptive management, unspecified: Z30.9

## 2015-02-08 MED ORDER — MEDROXYPROGESTERONE ACETATE 150 MG/ML IM SUSP: 150.0000 mg | INTRAMUSCULAR | Status: DC

## 2015-02-08 NOTE — Patient Instructions (Signed)
Get depo Monday Physical in 1 year Use condoms

## 2015-02-08 NOTE — Progress Notes (Signed)
Patient ID: Kaylee Miller, female   DOB: 12-29-1990, 24 y.o.   MRN: [id number] History of Present Illness: Toya is a 24 year old black female in for well woman gyn exam and pap, she has family planning medicaid.   Current Medications, Allergies, Past Medical History, Past Surgical History, Family History and Social History were reviewed in [employer] record.     Review of Systems: Patient denies any daily headaches(has one about once a week,takes tylenol with relief), hearing loss, fatigue, blurred vision, shortness of breath, chest pain, abdominal pain, problems with bowel movements, urination, or intercourse. No joint pain or mood swings.    Physical Exam:BP 128/78 mmHg  Pulse 64  Ht 5\' 7"  (1.702 m)  Wt 177 lb (80.287 kg)  BMI 27.72 kg/m2 General:  Well developed, well nourished, no acute distress Skin:  Warm and dry Neck:  Midline trachea, normal thyroid, good ROM, no lymphadenopathy Lungs; Clear to auscultation bilaterally Breast:  No dominant palpable mass, retraction, or nipple discharge Cardiovascular: Regular rate and rhythm Abdomen:  Soft, non tender, no hepatosplenomegaly Pelvic:  External genitalia is normal in appearance, no lesions.  The vagina is normal in appearance. Urethra has no lesions or masses. The cervix is bulbous. Pap with GC/CHL performed. Uterus is felt to be normal size, shape, and contour.  No adnexal masses or tenderness noted.Bladder is non tender, no masses felt. Extremities/musculoskeletal:  No swelling or varicosities noted, no clubbing or cyanosis Psych:  No mood changes, alert and cooperative,seems happy   Impression: Well woman gyn exam with pap and GC/CHL---family planning medicaid Contraceptive management    Plan: Check HIV and RPR Physical in 1 year Rx Depo provera 150 mg, disp.# 1 vail for IM injection every 3 months in office with 4 refills Return Monday for depo Use condoms

## 2015-02-09 LAB — CYTOLOGY - PAP

## 2015-02-09 LAB — HIV ANTIBODY (ROUTINE TESTING W REFLEX): HIV Screen 4th Generation wRfx: NONREACTIVE

## 2015-02-09 LAB — RPR: RPR Ser Ql: NONREACTIVE

## 2015-02-12 ENCOUNTER — Encounter: Payer: Self-pay | Admitting: *Deleted

## 2015-02-12 ENCOUNTER — Ambulatory Visit (INDEPENDENT_AMBULATORY_CARE_PROVIDER_SITE_OTHER): Payer: Medicaid Other | Admitting: *Deleted

## 2015-02-12 DIAGNOSIS — Z3042 Encounter for surveillance of injectable contraceptive: Secondary | ICD-10-CM

## 2015-02-12 DIAGNOSIS — Z3200 Encounter for pregnancy test, result unknown: Secondary | ICD-10-CM

## 2015-02-12 DIAGNOSIS — Z309 Encounter for contraceptive management, unspecified: Secondary | ICD-10-CM | POA: Diagnosis not present

## 2015-02-12 LAB — POCT URINE PREGNANCY: PREG TEST UR: NEGATIVE

## 2015-02-12 MED ORDER — MEDROXYPROGESTERONE ACETATE 150 MG/ML IM SUSP
150.0000 mg | Freq: Once | INTRAMUSCULAR | Status: AC
  Administered 2015-02-12: 150 mg via INTRAMUSCULAR

## 2015-02-12 NOTE — Progress Notes (Signed)
Patient ID: Kaylee Miller, female   DOB: 11/29/1990, 24 y.o.   MRN: [id number] Pt here today for DEPO injection. Pt denies any problems or concerns at this time. Pt given DEPO and tolerated well.

## 2015-02-15 ENCOUNTER — Other Ambulatory Visit: Payer: Medicaid Other | Admitting: Adult Health

## 2015-05-07 ENCOUNTER — Ambulatory Visit: Payer: Medicaid Other

## 2015-05-09 ENCOUNTER — Ambulatory Visit: Payer: Medicaid Other

## 2015-05-11 ENCOUNTER — Emergency Department (HOSPITAL_COMMUNITY)
Admission: EM | Admit: 2015-05-11 | Discharge: 2015-05-11 | Disposition: A | Discharge status: Home / Self Care | Payer: Medicaid Other | Attending: Physician Assistant | Admitting: Physician Assistant

## 2015-05-11 ENCOUNTER — Encounter (HOSPITAL_COMMUNITY): Payer: Self-pay | Admitting: Emergency Medicine

## 2015-05-11 DIAGNOSIS — Z8679 Personal history of other diseases of the circulatory system: Secondary | ICD-10-CM | POA: Insufficient documentation

## 2015-05-11 DIAGNOSIS — Z91040 Latex allergy status: Secondary | ICD-10-CM | POA: Insufficient documentation

## 2015-05-11 DIAGNOSIS — Z79899 Other long term (current) drug therapy: Secondary | ICD-10-CM | POA: Insufficient documentation

## 2015-05-11 DIAGNOSIS — Z3202 Encounter for pregnancy test, result negative: Secondary | ICD-10-CM | POA: Insufficient documentation

## 2015-05-11 DIAGNOSIS — Z8709 Personal history of other diseases of the respiratory system: Secondary | ICD-10-CM | POA: Insufficient documentation

## 2015-05-11 DIAGNOSIS — R51 Headache: Secondary | ICD-10-CM | POA: Insufficient documentation

## 2015-05-11 DIAGNOSIS — Z720 Tobacco use: Secondary | ICD-10-CM | POA: Insufficient documentation

## 2015-05-11 DIAGNOSIS — Z8619 Personal history of other infectious and parasitic diseases: Secondary | ICD-10-CM | POA: Insufficient documentation

## 2015-05-11 DIAGNOSIS — R519 Headache, unspecified: Secondary | ICD-10-CM

## 2015-05-11 DIAGNOSIS — R42 Dizziness and giddiness: Secondary | ICD-10-CM | POA: Insufficient documentation

## 2015-05-11 LAB — PREGNANCY, URINE: PREG TEST UR: NEGATIVE

## 2015-05-11 MED ORDER — IBUPROFEN 800 MG PO TABS
800.0000 mg | ORAL_TABLET | Freq: Once | ORAL | Status: AC
  Administered 2015-05-11: 800 mg via ORAL
  Filled 2015-05-11: qty 1

## 2015-05-11 MED ORDER — IBUPROFEN 800 MG PO TABS: 800.0000 mg | ORAL_TABLET | Freq: Three times a day (TID) | ORAL | Status: DC

## 2015-05-11 NOTE — ED Provider Notes (Addendum)
CSN: [id number]     Arrival date & time 05/11/15  1548 History   First MD Initiated Contact with Patient 05/11/15 1554     Chief Complaint  Patient presents with  . Headache  . Generalized Body Aches     (Consider location/radiation/quality/duration/timing/severity/associated sxs/prior Treatment) HPI   Patient is a 24 year old female presenting here with a headache. Patient has had headache on and off since Saturday. Patient has not tried any ibuprofen or anything over-the-counter at home. Patient also feels occasional aches in her bilateral arms and legs. She thinks might be from working overnight. She feels very tired at work all the time. She thinks because she's been working too hard.  She's history of headaches and she says it feels similar to other headaches. However she also has these body aches. She's had no fevers. She reports being able to eat and drink normally.  Past Medical History  Diagnosis Date  . Seasonal allergies   . Headache(784.0)     migraines  . HSV-2 (herpes simplex virus 2) infection   . History of bronchitis   . Pregnant   . Contraceptive management 02/08/2015   Past Surgical History  Procedure Laterality Date  . Adenoids    . Cesarean section  2007  . Tubes in ears    . Cesarean section N/A 07/28/2013    Procedure: CESAREAN SECTION;  Surgeon: Jonnie Kind, MD;  Location: [facility];  Service: Obstetrics;  Laterality: N/A;   Family History  Problem Relation Age of Onset  . Diabetes Other     great-great grandmother  . CAD Other   . Cancer Mother   . Hypertension Father   . Stroke Father   . CAD Maternal Grandfather   . CAD Paternal Grandmother   . CAD Paternal Grandfather    Social History  Substance Use Topics  . Smoking status: Current Some Day Smoker -- 1.00 packs/day for 6 years    Types: Cigarettes  . Smokeless tobacco: Never Used  . Alcohol Use: 0.0 oz/week    0 Standard drinks or equivalent per week     Comment: occasional   OB  History    Gravida Para Term Preterm AB TAB SAB Ectopic Multiple Living   2 2 2       2      Review of Systems  Constitutional: Negative for activity change and fatigue.  HENT: Negative for congestion and drooling.   Eyes: Negative for discharge.  Respiratory: Negative for cough and chest tightness.   Cardiovascular: Negative for chest pain.  Gastrointestinal: Negative for abdominal distention.  Genitourinary: Negative for dysuria and difficulty urinating.  Musculoskeletal: Positive for myalgias.  Skin: Negative for rash.  Allergic/Immunologic: Negative for immunocompromised state.  Neurological: Positive for dizziness, light-headedness and headaches. Negative for syncope and weakness.  Psychiatric/Behavioral: Negative for behavioral problems and agitation.      Allergies  Latex  Home Medications   Prior to Admission medications   Medication Sig Start Date End Date Taking? Authorizing Provider  albuterol (PROVENTIL HFA) 108 (90 BASE) MCG/ACT inhaler Inhale 2 puffs into the lungs every 6 (six) hours as needed for wheezing or shortness of breath.    Historical Provider, MD  medroxyPROGESTERone (DEPO-PROVERA) 150 MG/ML injection Inject 1 mL (150 mg total) into the muscle every 3 (three) months. 02/08/15   Estill Dooms, NP   BP 100/83 mmHg  Pulse 79  Temp(Src) 98.2 F (36.8 C) (Oral)  Resp 18  Ht 5\' 7"  (1.702  m)  Wt 189 lb (85.73 kg)  BMI 29.59 kg/m2  SpO2 100%  LMP 04/27/2015 Physical Exam  Constitutional: She is oriented to person, place, and time. She appears well-developed and well-nourished.  HENT:  Head: Normocephalic and atraumatic.  Eyes: Conjunctivae are normal. Right eye exhibits no discharge.  Neck: Neck supple.  Cardiovascular: Normal rate, regular rhythm and normal heart sounds.   No murmur heard. Pulmonary/Chest: Effort normal and breath sounds normal. She has no wheezes. She has no rales.  Abdominal: Soft. She exhibits no distension. There is no  tenderness.  Musculoskeletal: Normal range of motion. She exhibits no edema.  Neurological: She is oriented to person, place, and time. No cranial nerve deficit.  Skin: Skin is warm and dry. No rash noted. She is not diaphoretic.  Psychiatric: She has a normal mood and affect. Her behavior is normal.  Nursing note and vitals reviewed.   ED Course  Procedures (including critical care time) Labs Review Labs Reviewed  PREGNANCY, URINE    Imaging Review No results found. I have personally reviewed and evaluated these images and lab results as part of my medical decision-making.   EKG Interpretation None      MDM   Final diagnoses:  None    This is a 24 year old healthy female with history of migraines presenting today with a headache. Patient has had intermittent headaches for the last week. She's not tried anything at home for them. She also reports body aches. She has no fevers. Not concerned about tick borne illness given the fact that there are no fevers and patient appears well-hydrated healthy otherwise. Patient would like a note for work. We will treat her headache with ibuprofen check for pregnancy and then give her note for work. We gave patient the option of IV medications to help with headache versus by mouth and she would like by mouth at this time.  Courteney Julio Alm, MD 05/11/15 [place], MD 07/05/15 Madison Mackuen, MD 07/07/15 [phone]

## 2015-05-11 NOTE — Discharge Instructions (Signed)
Return with any change in symptoms.

## 2015-05-11 NOTE — ED Notes (Signed)
MD at bedside. 

## 2015-05-11 NOTE — ED Notes (Signed)
Patient complaining of headache and body aches since yesterday.

## 2015-05-14 ENCOUNTER — Ambulatory Visit (INDEPENDENT_AMBULATORY_CARE_PROVIDER_SITE_OTHER): Payer: Medicaid Other | Admitting: *Deleted

## 2015-05-14 ENCOUNTER — Encounter: Payer: Self-pay | Admitting: *Deleted

## 2015-05-14 DIAGNOSIS — Z3042 Encounter for surveillance of injectable contraceptive: Secondary | ICD-10-CM

## 2015-05-14 DIAGNOSIS — Z3202 Encounter for pregnancy test, result negative: Secondary | ICD-10-CM

## 2015-05-14 LAB — POCT URINE PREGNANCY: Preg Test, Ur: NEGATIVE

## 2015-05-14 MED ORDER — MEDROXYPROGESTERONE ACETATE 150 MG/ML IM SUSP
150.0000 mg | Freq: Once | INTRAMUSCULAR | Status: AC
  Administered 2015-05-14: 150 mg via INTRAMUSCULAR

## 2015-05-14 NOTE — Progress Notes (Signed)
Pt here for Depo. Reports no problems at this time. Return in 12 weeks for next shot. JSY 

## 2015-08-06 ENCOUNTER — Ambulatory Visit (INDEPENDENT_AMBULATORY_CARE_PROVIDER_SITE_OTHER): Payer: Medicaid Other | Admitting: *Deleted

## 2015-08-06 ENCOUNTER — Encounter: Payer: Self-pay | Admitting: *Deleted

## 2015-08-06 ENCOUNTER — Ambulatory Visit: Payer: Medicaid Other

## 2015-08-06 DIAGNOSIS — Z3202 Encounter for pregnancy test, result negative: Secondary | ICD-10-CM | POA: Diagnosis not present

## 2015-08-06 DIAGNOSIS — Z3042 Encounter for surveillance of injectable contraceptive: Secondary | ICD-10-CM

## 2015-08-06 LAB — POCT URINE PREGNANCY: Preg Test, Ur: NEGATIVE

## 2015-08-06 MED ORDER — MEDROXYPROGESTERONE ACETATE 150 MG/ML IM SUSP
150.0000 mg | Freq: Once | INTRAMUSCULAR | Status: AC
  Administered 2015-08-06: 150 mg via INTRAMUSCULAR

## 2015-08-06 NOTE — Progress Notes (Signed)
Pt here for Depo. Reports no problems at this time. Return in 12 weeks for next shot. JSY 

## 2015-08-13 ENCOUNTER — Encounter (HOSPITAL_COMMUNITY): Payer: Self-pay | Admitting: *Deleted

## 2015-08-13 ENCOUNTER — Emergency Department (HOSPITAL_COMMUNITY)
Admission: EM | Admit: 2015-08-13 | Discharge: 2015-08-13 | Disposition: A | Discharge status: Home / Self Care | Payer: Medicaid Other | Attending: Emergency Medicine | Admitting: Emergency Medicine

## 2015-08-13 DIAGNOSIS — G43009 Migraine without aura, not intractable, without status migrainosus: Secondary | ICD-10-CM | POA: Insufficient documentation

## 2015-08-13 DIAGNOSIS — Z8619 Personal history of other infectious and parasitic diseases: Secondary | ICD-10-CM | POA: Insufficient documentation

## 2015-08-13 DIAGNOSIS — Z8709 Personal history of other diseases of the respiratory system: Secondary | ICD-10-CM | POA: Insufficient documentation

## 2015-08-13 DIAGNOSIS — Z91040 Latex allergy status: Secondary | ICD-10-CM | POA: Insufficient documentation

## 2015-08-13 DIAGNOSIS — Z79899 Other long term (current) drug therapy: Secondary | ICD-10-CM | POA: Insufficient documentation

## 2015-08-13 DIAGNOSIS — F17210 Nicotine dependence, cigarettes, uncomplicated: Secondary | ICD-10-CM | POA: Insufficient documentation

## 2015-08-13 MED ORDER — SODIUM CHLORIDE 0.9 % IV SOLN
1000.0000 mL | Freq: Once | INTRAVENOUS | Status: AC
  Administered 2015-08-13: 1000 mL via INTRAVENOUS

## 2015-08-13 MED ORDER — DEXAMETHASONE SODIUM PHOSPHATE 10 MG/ML IJ SOLN
10.0000 mg | Freq: Once | INTRAMUSCULAR | Status: AC
  Administered 2015-08-13: 10 mg via INTRAVENOUS
  Filled 2015-08-13: qty 1

## 2015-08-13 MED ORDER — SODIUM CHLORIDE 0.9 % IV SOLN
1000.0000 mL | INTRAVENOUS | Status: DC
  Administered 2015-08-13: 1000 mL via INTRAVENOUS

## 2015-08-13 MED ORDER — METOCLOPRAMIDE HCL 5 MG/ML IJ SOLN
10.0000 mg | Freq: Once | INTRAMUSCULAR | Status: AC
  Administered 2015-08-13: 10 mg via INTRAVENOUS
  Filled 2015-08-13: qty 2

## 2015-08-13 MED ORDER — DIPHENHYDRAMINE HCL 50 MG/ML IJ SOLN
25.0000 mg | Freq: Once | INTRAMUSCULAR | Status: AC
  Administered 2015-08-13: 25 mg via INTRAVENOUS
  Filled 2015-08-13: qty 1

## 2015-08-13 NOTE — ED Notes (Signed)
Pt c/o migraine headache that started three days ago, reports that the headache is sensitive to light, denies any n/v, has hx of migraines,

## 2015-08-13 NOTE — Discharge Instructions (Signed)
Do not smoke. Nicotine can act as a trigger for migraine headaches.  Migraine Headache A migraine headache is an intense, throbbing pain on one or both sides of your head. A migraine can last for 30 minutes to several hours. CAUSES  The exact cause of a migraine headache is not always known. However, a migraine may be caused when nerves in the brain become irritated and release chemicals that cause inflammation. This causes pain. Certain things may also trigger migraines, such as:  Alcohol.  Smoking.  Stress.  Menstruation.  Aged cheeses.  Foods or drinks that contain nitrates, glutamate, aspartame, or tyramine.  Lack of sleep.  Chocolate.  Caffeine.  Hunger.  Physical exertion.  Fatigue.  Medicines used to treat chest pain (nitroglycerine), birth control pills, estrogen, and some blood pressure medicines. SIGNS AND SYMPTOMS  Pain on one or both sides of your head.  Pulsating or throbbing pain.  Severe pain that prevents daily activities.  Pain that is aggravated by any physical activity.  Nausea, vomiting, or both.  Dizziness.  Pain with exposure to bright lights, loud noises, or activity.  General sensitivity to bright lights, loud noises, or smells. Before you get a migraine, you may get warning signs that a migraine is coming (aura). An aura may include:  Seeing flashing lights.  Seeing bright spots, halos, or zigzag lines.  Having tunnel vision or blurred vision.  Having feelings of numbness or tingling.  Having trouble talking.  Having muscle weakness. DIAGNOSIS  A migraine headache is often diagnosed based on:  Symptoms.  Physical exam.  A CT scan or MRI of your head. These imaging tests cannot diagnose migraines, but they can help rule out other causes of headaches. TREATMENT Medicines may be given for pain and nausea. Medicines can also be given to help prevent recurrent migraines.  HOME CARE INSTRUCTIONS  Only take over-the-counter  or prescription medicines for pain or discomfort as directed by your health care provider. The use of long-term narcotics is not recommended.  Lie down in a dark, quiet room when you have a migraine.  Keep a journal to find out what may trigger your migraine headaches. For example, write down:  What you eat and drink.  How much sleep you get.  Any change to your diet or medicines.  Limit alcohol consumption.  Quit smoking if you smoke.  Get 7-9 hours of sleep, or as recommended by your health care provider.  Limit stress.  Keep lights dim if bright lights bother you and make your migraines worse. SEEK IMMEDIATE MEDICAL CARE IF:   Your migraine becomes severe.  You have a fever.  You have a stiff neck.  You have vision loss.  You have muscular weakness or loss of muscle control.  You start losing your balance or have trouble walking.  You feel faint or pass out.  You have severe symptoms that are different from your first symptoms. MAKE SURE YOU:   Understand these instructions.  Will watch your condition.  Will get help right away if you are not doing well or get worse.   This information is not intended to replace advice given to you by your health care provider. Make sure you discuss any questions you have with your health care provider.   Document Released: 08/11/2005 Document Revised: 09/01/2014 Document Reviewed: 04/18/2013 Elsevier Interactive Patient Education 2016 [employer].  Smoking Hazards Smoking cigarettes is extremely bad for your health. Tobacco smoke has over 200 known poisons in it. It contains  the poisonous gases nitrogen oxide and carbon monoxide. There are over 60 chemicals in tobacco smoke that cause cancer. Some of the chemicals found in cigarette smoke include:   Cyanide.   Benzene.   Formaldehyde.   Methanol (wood alcohol).   Acetylene (fuel used in welding torches).   Ammonia.  Even smoking lightly shortens your life  expectancy by several years. You can greatly reduce the risk of medical problems for you and your family by stopping now. Smoking is the most preventable cause of death and disease in our society. Within days of quitting smoking, your circulation improves, you decrease the risk of having a heart attack, and your lung capacity improves. There may be some increased phlegm in the first few days after quitting, and it may take months for your lungs to clear up completely. Quitting for 10 years reduces your risk of developing lung cancer to almost that of a nonsmoker.  WHAT ARE THE RISKS OF SMOKING? Cigarette smokers have an increased risk of many serious medical problems, including:  Lung cancer.   Lung disease (such as pneumonia, bronchitis, and emphysema).   Heart attack and chest pain due to the heart not getting enough oxygen (angina).   Heart disease and peripheral blood vessel disease.   Hypertension.   Stroke.   Oral cancer (cancer of the lip, mouth, or voice box).   Bladder cancer.   Pancreatic cancer.   Cervical cancer.   Pregnancy complications, including premature birth.   Stillbirths and smaller newborn babies, birth defects, and genetic damage to sperm.   Early menopause.   Lower estrogen level for women.   Infertility.   Facial wrinkles.   Blindness.   Increased risk of broken bones (fractures).   Senile dementia.   Stomach ulcers and internal bleeding.   Delayed wound healing and increased risk of complications during surgery. Because of secondhand smoke exposure, children of smokers have an increased risk of the following:   Sudden infant death syndrome (SIDS).   Respiratory infections.   Lung cancer.   Heart disease.   Ear infections.  WHY IS SMOKING ADDICTIVE? Nicotine is the chemical agent in tobacco that is capable of causing addiction or dependence. When you smoke and inhale, nicotine is absorbed rapidly into the  bloodstream through your lungs. Both inhaled and noninhaled nicotine may be addictive.  WHAT ARE THE BENEFITS OF QUITTING?  There are many health benefits to quitting smoking. Some are:   The likelihood of developing cancer and heart disease decreases. Health improvements are seen almost immediately.   Blood pressure, pulse rate, and breathing patterns start returning to normal soon after quitting.   People who quit may see an improvement in their overall quality of life.  HOW DO YOU QUIT SMOKING? Smoking is an addiction with both physical and psychological effects, and longtime habits can be hard to change. Your health care provider can recommend:  Programs and community resources, which may include group support, education, or therapy.  Replacement products, such as patches, gum, and nasal sprays. Use these products only as directed. Do not replace cigarette smoking with electronic cigarettes (commonly called e-cigarettes). The safety of e-cigarettes is unknown, and some may contain harmful chemicals. FOR MORE INFORMATION  American Lung Association: www.lung.org  American Cancer Society: www.cancer.org   This information is not intended to replace advice given to you by your health care provider. Make sure you discuss any questions you have with your health care provider.   Document Released: 09/18/2004 Document Revised:  06/01/2013 Document Reviewed: 01/31/2013 Elsevier Interactive Patient Education [employer].

## 2015-08-13 NOTE — ED Provider Notes (Signed)
CSN: [id number]     Arrival date & time 08/13/15  1828 History   First MD Initiated Contact with Patient 08/13/15 1837     Chief Complaint  Patient presents with  . Migraine     (Consider location/radiation/quality/duration/timing/severity/associated sxs/prior Treatment) Patient is a 24 y.o. female presenting with migraines. The history is provided by the patient.  Migraine  She complains of a migraine headache for the last 3 days. Pain is in the left retro-orbital area. She describes a pounding pain which is worse with exposure to light and noise. She rates pain at 9/10. Acetaminophen gives slight, temporary relief of pain. There is associated nausea and vomiting but she is not nauseous today. She denies focal weakness or numbness. She denies fever or chills. She denies neck stiffness. Headache is typical of her migraines except that prior migraines and actually been global and not hemicranial. She is also complaining of pain in her right ear that throbs in the same way that her headache is throbbing. She does smoke half pack of cigarettes a day.  Past Medical History  Diagnosis Date  . Seasonal allergies   . Headache(784.0)     migraines  . HSV-2 (herpes simplex virus 2) infection   . History of bronchitis   . Pregnant   . Contraceptive management 02/08/2015   Past Surgical History  Procedure Laterality Date  . Adenoids    . Cesarean section  2007  . Tubes in ears    . Cesarean section N/A 07/28/2013    Procedure: CESAREAN SECTION;  Surgeon: Jonnie Kind, MD;  Location: [facility];  Service: Obstetrics;  Laterality: N/A;   Family History  Problem Relation Age of Onset  . Diabetes Other     great-great grandmother  . CAD Other   . Cancer Mother   . Hypertension Father   . Stroke Father   . CAD Maternal Grandfather   . CAD Paternal Grandmother   . CAD Paternal Grandfather    Social History  Substance Use Topics  . Smoking status: Current Some Day Smoker -- 1.00 packs/day  for 6 years    Types: Cigarettes  . Smokeless tobacco: Never Used  . Alcohol Use: 0.0 oz/week    0 Standard drinks or equivalent per week     Comment: occasional   OB History    Gravida Para Term Preterm AB TAB SAB Ectopic Multiple Living   2 2 2       2      Review of Systems  All other systems reviewed and are negative.     Allergies  Latex  Home Medications   Prior to Admission medications   Medication Sig Start Date End Date Taking? Authorizing Provider  albuterol (PROVENTIL HFA) 108 (90 BASE) MCG/ACT inhaler Inhale 2 puffs into the lungs every 6 (six) hours as needed for wheezing or shortness of breath.    Historical Provider, MD  medroxyPROGESTERone (DEPO-PROVERA) 150 MG/ML injection Inject 1 mL (150 mg total) into the muscle every 3 (three) months. 02/08/15   Estill Dooms, NP   BP 128/72 mmHg  Pulse 82  Temp(Src) 97.6 F (36.4 C) (Oral)  Resp 20  Ht 5\' 7"  (1.702 m)  Wt 179 lb 8 oz (81.421 kg)  BMI 28.11 kg/m2  SpO2 100% Physical Exam  Nursing note and vitals reviewed.  24 year old female, resting comfortably and in no acute distress. Vital signs are normal. Oxygen saturation is 100%, which is normal. Head is normocephalic and atraumatic.  PERRLA, EOMI. Oropharynx is clear. Fundi show no hemorrhage, exudate, or papilledema. TMs are clear. Neck is nontender and supple without adenopathy or JVD. Back is nontender and there is no CVA tenderness. Lungs are clear without rales, wheezes, or rhonchi. Chest is nontender. Heart has regular rate and rhythm without murmur. Abdomen is soft, flat, nontender without masses or hepatosplenomegaly and peristalsis is normoactive. Extremities have no cyanosis or edema, full range of motion is present. Skin is warm and dry without rash. Neurologic: Mental status is normal, cranial nerves are intact, there are no motor or sensory deficits.  ED Course  Procedures (including critical care time)  MDM   Final diagnoses:   Migraine without aura and without status migrainosus, not intractable    Headache consistent with migraine headache. No red flags to suggest more serious causes of headache. Old records are reviewed and she does have a prior ED visit for headache. She will be given a migraine cocktail of normal saline, diphenhydramine, and metoclopramide, and dexamethasone.  She feels much better after above noted treatment. She is discharged. Advised to stop smoking.  Delora Fuel, MD [phone] [id number]

## 2015-10-29 ENCOUNTER — Ambulatory Visit: Payer: Medicaid Other

## 2015-10-30 ENCOUNTER — Ambulatory Visit (INDEPENDENT_AMBULATORY_CARE_PROVIDER_SITE_OTHER): Payer: Medicaid Other | Admitting: *Deleted

## 2015-10-30 ENCOUNTER — Encounter: Payer: Self-pay | Admitting: *Deleted

## 2015-10-30 DIAGNOSIS — Z3042 Encounter for surveillance of injectable contraceptive: Secondary | ICD-10-CM

## 2015-10-30 DIAGNOSIS — Z3202 Encounter for pregnancy test, result negative: Secondary | ICD-10-CM

## 2015-10-30 DIAGNOSIS — Z309 Encounter for contraceptive management, unspecified: Secondary | ICD-10-CM

## 2015-10-30 LAB — POCT URINE PREGNANCY: Preg Test, Ur: NEGATIVE

## 2015-10-30 MED ORDER — MEDROXYPROGESTERONE ACETATE 150 MG/ML IM SUSP
150.0000 mg | Freq: Once | INTRAMUSCULAR | Status: AC
  Administered 2015-10-30: 150 mg via INTRAMUSCULAR

## 2015-10-30 NOTE — Progress Notes (Signed)
Pt here for Depo. Pt tolerated shot well. Return in 12 weeks for next shot. JSY 

## 2016-01-22 ENCOUNTER — Ambulatory Visit: Payer: Medicaid Other

## 2016-01-24 ENCOUNTER — Ambulatory Visit (INDEPENDENT_AMBULATORY_CARE_PROVIDER_SITE_OTHER): Payer: Medicaid Other | Admitting: *Deleted

## 2016-01-24 ENCOUNTER — Encounter: Payer: Self-pay | Admitting: *Deleted

## 2016-01-24 DIAGNOSIS — Z3042 Encounter for surveillance of injectable contraceptive: Secondary | ICD-10-CM | POA: Diagnosis not present

## 2016-01-24 DIAGNOSIS — Z3200 Encounter for pregnancy test, result unknown: Secondary | ICD-10-CM

## 2016-01-24 LAB — POCT URINE PREGNANCY: PREG TEST UR: NEGATIVE

## 2016-01-24 MED ORDER — MEDROXYPROGESTERONE ACETATE 150 MG/ML IM SUSP
150.0000 mg | Freq: Once | INTRAMUSCULAR | Status: AC
  Administered 2016-01-24: 150 mg via INTRAMUSCULAR

## 2016-01-24 NOTE — Progress Notes (Signed)
Patient ID: Kaylee Miller, female   DOB: [birth year], 25 y.o.   MRN: [id number] Pt was given DEPO and tolerated well.

## 2016-02-27 ENCOUNTER — Emergency Department (HOSPITAL_COMMUNITY): Payer: Medicaid Other

## 2016-02-27 ENCOUNTER — Emergency Department (HOSPITAL_COMMUNITY)
Admission: EM | Admit: 2016-02-27 | Discharge: 2016-02-27 | Disposition: A | Discharge status: Home / Self Care | Payer: Medicaid Other | Attending: Emergency Medicine | Admitting: Emergency Medicine

## 2016-02-27 ENCOUNTER — Encounter (HOSPITAL_COMMUNITY): Payer: Self-pay | Admitting: Emergency Medicine

## 2016-02-27 DIAGNOSIS — S76112A Strain of left quadriceps muscle, fascia and tendon, initial encounter: Secondary | ICD-10-CM | POA: Insufficient documentation

## 2016-02-27 DIAGNOSIS — X58XXXA Exposure to other specified factors, initial encounter: Secondary | ICD-10-CM | POA: Insufficient documentation

## 2016-02-27 DIAGNOSIS — Y929 Unspecified place or not applicable: Secondary | ICD-10-CM | POA: Insufficient documentation

## 2016-02-27 DIAGNOSIS — Y939 Activity, unspecified: Secondary | ICD-10-CM | POA: Insufficient documentation

## 2016-02-27 DIAGNOSIS — F17210 Nicotine dependence, cigarettes, uncomplicated: Secondary | ICD-10-CM | POA: Insufficient documentation

## 2016-02-27 DIAGNOSIS — Y999 Unspecified external cause status: Secondary | ICD-10-CM | POA: Insufficient documentation

## 2016-02-27 LAB — POC URINE PREG, ED: Preg Test, Ur: NEGATIVE

## 2016-02-27 LAB — D-DIMER, QUANTITATIVE: D-Dimer, Quant: <0.27 ug{FEU}/mL (ref 0.00–0.50)

## 2016-02-27 MED ORDER — NAPROXEN 500 MG PO TABS: 500.0000 mg | ORAL_TABLET | Freq: Two times a day (BID) | ORAL | Status: DC

## 2016-02-27 MED ORDER — TRAMADOL HCL 50 MG PO TABS: 50.0000 mg | ORAL_TABLET | Freq: Four times a day (QID) | ORAL | Status: DC | PRN

## 2016-02-27 NOTE — ED Provider Notes (Signed)
CSN: [id number]     Arrival date & time 02/27/16  1742 History   First MD Initiated Contact with Patient 02/27/16 1804     Chief Complaint  Patient presents with  . Leg Pain     (Consider location/radiation/quality/duration/timing/severity/associated sxs/prior Treatment) The history is provided by the patient.   Kaylee Miller is a 25 y.o. female presenting with chronic left she describes a job in which she stands for long periods of time, notes that towards the end of her day she has a "knot" behind her bilateral knees which improve overnight while while lying down.  Her left knee has increased swelling typically.  She denies pain at this site and also denies knee pain or history of knee injury.  Over the past week she has developed pain in her left anterior groin as well.  This pain is worsened with movement such as hip flexion and rotation.  She denies any specific injuries, falls, slips etc.  She denies swelling in her feet or ankles and has found no alleviators.  She took 3 ibuprofen tablets a day without relief.  She was told this could be a blood clot which prompted her visit this evening.  She denies chest pain, shortness of breath, fevers, chills, rash.    Past Medical History  Diagnosis Date  . Seasonal allergies   . Headache(784.0)     migraines  . HSV-2 (herpes simplex virus 2) infection   . History of bronchitis   . Pregnant   . Contraceptive management 02/08/2015   Past Surgical History  Procedure Laterality Date  . Adenoids    . Cesarean section  2007  . Tubes in ears    . Cesarean section N/A 07/28/2013    Procedure: CESAREAN SECTION;  Surgeon: Jonnie Kind, MD;  Location: [facility];  Service: Obstetrics;  Laterality: N/A;   Family History  Problem Relation Age of Onset  . Diabetes Other     great-great grandmother  . CAD Other   . Cancer Mother   . Hypertension Father   . Stroke Father   . CAD Maternal Grandfather   . CAD Paternal Grandmother   . CAD  Paternal Grandfather    Social History  Substance Use Topics  . Smoking status: Current Some Day Smoker -- 1.00 packs/day for 6 years    Types: Cigarettes  . Smokeless tobacco: Never Used  . Alcohol Use: 0.0 oz/week    0 Standard drinks or equivalent per week     Comment: occasional   OB History    Gravida Para Term Preterm AB TAB SAB Ectopic Multiple Living   2 2 2       2      Review of Systems  Constitutional: Negative for fever and chills.  Respiratory: Negative for shortness of breath.   Cardiovascular: Negative for chest pain.  Gastrointestinal: Negative.  Negative for nausea, vomiting and abdominal pain.  Musculoskeletal: Positive for joint swelling and arthralgias. Negative for myalgias.  Skin: Negative.  Negative for color change and rash.  Neurological: Negative for weakness and numbness.      Allergies  Latex  Home Medications   Prior to Admission medications   Medication Sig Start Date End Date Taking? Authorizing Provider  albuterol (PROVENTIL HFA) 108 (90 BASE) MCG/ACT inhaler Inhale 2 puffs into the lungs every 6 (six) hours as needed for wheezing or shortness of breath.    Historical Provider, MD  medroxyPROGESTERone (DEPO-PROVERA) 150 MG/ML injection Inject 1 mL (150 mg  total) into the muscle every 3 (three) months. 02/08/15   Estill Dooms, NP  naproxen (NAPROSYN) 500 MG tablet Take 1 tablet (500 mg total) by mouth 2 (two) times daily. 02/27/16   Evalee Jefferson, PA-C  traMADol (ULTRAM) 50 MG tablet Take 1 tablet (50 mg total) by mouth every 6 (six) hours as needed. 02/27/16   Evalee Jefferson, PA-C   BP 110/60 mmHg  Pulse 83  Temp(Src) 99.2 F (37.3 C) (Oral)  Resp 16  Ht 5\' 7"  (1.702 m)  Wt 83.915 kg  BMI 28.97 kg/m2  SpO2 100% Physical Exam  Constitutional: She appears well-developed and well-nourished.  HENT:  Head: Atraumatic.  Neck: Normal range of motion.  Cardiovascular:  Pulses:      Popliteal pulses are 2+ on the right side, and 2+ on the left  side.  Pulses equal bilaterally.  No ankle edema.  No calf tenderness.  Calves are symmetric.  Musculoskeletal: She exhibits tenderness.       Left hip: She exhibits tenderness. She exhibits normal range of motion, normal strength, no swelling and no deformity.       Left knee: She exhibits normal range of motion, no swelling, no deformity and no erythema. No tenderness found. No medial joint line and no lateral joint line tenderness noted.       Legs: Tender to palpation left anterior groin .  No edema, erythema, skin changes.  No nodules, no lymphadenopathy.  Pain is worsened with hip internal and external rotation.  Also worsened with attempts at hip flexion.  Left knee exam is unremarkable.  Neurological: She is alert. She has normal strength. She displays normal reflexes. No sensory deficit.  Skin: Skin is warm and dry.  Psychiatric: She has a normal mood and affect.    ED Course  Procedures (including critical care time) Labs Review Labs Reviewed  D-DIMER, QUANTITATIVE (NOT AT [facility])  POC URINE PREG, ED    Imaging Review Dg Hip Unilat With Pelvis 2-3 Views Left  02/27/2016  CLINICAL DATA:  Patient states "I have two knots in my leg. One in the top part of my leg and the other is behind my knee." States this has been going on for months but worsened today EXAM: DG HIP (WITH OR WITHOUT PELVIS) 2-3V LEFT COMPARISON:  None. FINDINGS: Normal pelvis and sacrum. Dedicated view of the LEFT hip demonstrates no fracture or displacement. IMPRESSION: Normal radiographs of the pelvis and LEFT hip. Electronically Signed   By: Suzy Bouchard M.D.   On: 02/27/2016 19:04   I have personally reviewed and evaluated these images and lab results as part of my medical decision-making.   EKG Interpretation None      MDM   Final diagnoses:  Quadriceps strain, left, initial encounter    Patients labs reviewed.  Radiological studies were viewed, interpreted and considered during the medical decision  making and disposition process. I agree with radiologists reading.  Results were also discussed with patient.  Exam and history is consistent with musculoskeletal source.  I suspect she may have a quadriceps tendon strain, possibly from overuse syndrome.  She was placed on Naprosyn twice a day, tramadol.  Advised the therapy.  Referral for obtain primary care and for follow-up of this problem if symptoms persist or worsen.  Patients  labs reviewed.  Normal range d-dimer.   Radiological studies were viewed, interpreted and considered during the medical decision making and disposition process. I agree with radiologists reading.  Results were also  discussed with patient.    The patient appears reasonably screened and/or stabilized for discharge and I doubt any other medical condition or other [facility] requiring further screening, evaluation, or treatment in the ED at this time prior to discharge.      Evalee Jefferson, PA-C 02/28/16 0050  Fredia Sorrow, MD 02/28/16 [phone]

## 2016-02-27 NOTE — ED Notes (Signed)
Patient states "I have two knots in my leg. One in the top part of my leg and the other is behind my knee." States this has been going on for months but worsened today.

## 2016-02-27 NOTE — Discharge Instructions (Signed)
Quadriceps Strain With Strain A strain is a tear in a muscle or the tendon that attaches the muscle to bone. A quadriceps strain is a tear in the muscles on the front of the thigh (quadriceps muscles) or their tendons. The quadriceps muscles are important for straightening the knee and bending the hip. The condition is characterized by pain, inflammation, and reduced function of these muscles. Strains are classified into three categories. Grade 1 strains cause pain, but the tendon is not lengthened. Grade 2 strains include a lengthened ligament due to the ligament being stretched or partially ruptured. With grade 2 strains there is still function, although the function may be diminished. Grade 3 strains are characterized by a complete tear of the tendon or muscle, and function is usually impaired.  SYMPTOMS   Pain, tenderness, inflammation, and/or bruising (contusion) over the quadriceps muscles  Pain that worsens with use of the quadriceps muscles.  Muscle spasm in the thigh.  Difficulty with common tasks that involve the quadriceps muscle, such as walking.  A crackling sound (crepitation) when the tendon is moved or touched.  Loss of fullness of the muscle or bulging within the area of muscle with complete rupture. CAUSES  A strain occurs when a force is placed on the muscle or tendon that is greater than it can withstand. Common mechanisms of injury include:  Repetitive strenuous use of the quadriceps muscles. This may be due to an increase in the intensity, frequency, or duration of exercise.  Direct trauma to the quadriceps muscles or tendons. RISK INCREASES WITH:  Activities that involve forceful contractions of the quadriceps muscles (jumping or sprinting).  Contact sports (soccer or football).  Poor strength and flexibility.  Failure to warm-up properly before activity.  Previous injury to the thigh or knee. PREVENTION  Warm up and stretch properly before activity.  Allow  for adequate recovery between workouts.  Maintain physical fitness:  Strength, flexibility, and endurance.  Cardiovascular fitness.  Wear properly fitted and padded protective equipment. PROGNOSIS  If treated properly, then quadriceps muscles strains are usually curable within 6 weeks.  RELATED COMPLICATIONS   Prolonged healing time, if improperly treated or re-injured.  Recurrent symptoms that result in a chronic problem.  Recurrence of symptoms if activity is resumed too soon. TREATMENT  Treatment initially involves the use of ice and medication to help reduce pain and inflammation. The use of strengthening and stretching exercises may help reduce pain with activity. These exercises may be performed at home or with referral to a therapist. Crutches may be recommended to allow the muscle to rest until walking can be completed without limping. Surgery is rarely necessary for this injury, but may be considered if the injury involves a grade 3 strain, or if symptoms persist for greater than 3 months despite non-surgical (conservative) treatment.  MEDICATION  If pain medication is necessary, then nonsteroidal anti-inflammatory medications, such as aspirin and ibuprofen, or other minor pain relievers, such as acetaminophen, are often recommended.  Do not take pain medication for 7 days before surgery.  Prescription pain relievers may be given if deemed necessary by your caregiver. Use only as directed and only as much as you need.  Ointments applied to the skin may be helpful.  Corticosteroid injections may be given by your caregiver. These injections should be reserved for the most serious cases, because they may only be given a certain number of times. HEAT AND COLD  Cold treatment (icing) relieves pain and reduces inflammation. Cold treatment should be  applied for 10 to 15 minutes every 2 to 3 hours for inflammation and pain and immediately after any activity that aggravates your  symptoms. Use ice packs or massage the area with a piece of ice (ice massage).  Heat treatment may be used prior to performing the stretching and strengthening activities prescribed by your caregiver, physical therapist, or athletic trainer. Use a heat pack or soak the injury in warm water. SEEK MEDICAL CARE IF:  Treatment seems to offer no benefit, or the condition worsens.  Any medications produce adverse side effects. EXERCISES  RANGE OF MOTION (ROM) AND STRETCHING EXERCISES - Quadriceps Strain These exercises may help you when beginning to rehabilitate your injury. Your symptoms may resolve with or without further involvement from your physician, physical therapist or athletic trainer. While completing these exercises, remember:   Restoring tissue flexibility helps normal motion to return to the joints. This allows healthier, less painful movement and activity.  An effective stretch should be held for at least 30 seconds.  A stretch should never be painful. You should only feel a gentle lengthening or release in the stretched tissue.   STRENGTH - Quadriceps, Isometrics  Lie on your back with your right / left leg extended and your opposite knee bent.  Gradually tense the muscles in the front of your right / left thigh. You should see either your knee cap slide up toward your hip or increased dimpling just above the knee. This motion will push the back of the knee down toward the floor/mat/bed on which you are lying.  Hold the muscle as tight as you can without increasing your pain for __________ seconds.  Relax the muscles slowly and completely in between each repetition. Repeat __________ times. Complete this exercise __________ times per day.

## 2016-04-15 ENCOUNTER — Other Ambulatory Visit: Payer: Self-pay | Admitting: Adult Health

## 2016-04-17 ENCOUNTER — Ambulatory Visit (INDEPENDENT_AMBULATORY_CARE_PROVIDER_SITE_OTHER): Payer: Medicaid Other | Admitting: *Deleted

## 2016-04-17 ENCOUNTER — Encounter: Payer: Self-pay | Admitting: *Deleted

## 2016-04-17 DIAGNOSIS — Z308 Encounter for other contraceptive management: Secondary | ICD-10-CM

## 2016-04-17 DIAGNOSIS — Z3202 Encounter for pregnancy test, result negative: Secondary | ICD-10-CM

## 2016-04-17 DIAGNOSIS — Z3042 Encounter for surveillance of injectable contraceptive: Secondary | ICD-10-CM

## 2016-04-17 LAB — POCT URINE PREGNANCY: PREG TEST UR: NEGATIVE

## 2016-04-17 MED ORDER — MEDROXYPROGESTERONE ACETATE 150 MG/ML IM SUSP
150.0000 mg | Freq: Once | INTRAMUSCULAR | Status: AC
  Administered 2016-04-17: 150 mg via INTRAMUSCULAR

## 2016-04-17 NOTE — Progress Notes (Signed)
Pt here for Depo. Pt tolerated shot well. Return in 12 weeks for next shot. JSY 

## 2016-05-05 ENCOUNTER — Other Ambulatory Visit: Payer: Medicaid Other | Admitting: Adult Health

## 2016-05-05 ENCOUNTER — Ambulatory Visit (INDEPENDENT_AMBULATORY_CARE_PROVIDER_SITE_OTHER): Payer: Medicaid Other | Admitting: Adult Health

## 2016-05-05 ENCOUNTER — Encounter: Payer: Self-pay | Admitting: Adult Health

## 2016-05-05 VITALS — BP 100/80 | HR 64 | Ht 67.25 in | Wt 181.5 lb

## 2016-05-05 DIAGNOSIS — Z3042 Encounter for surveillance of injectable contraceptive: Secondary | ICD-10-CM

## 2016-05-05 DIAGNOSIS — Z308 Encounter for other contraceptive management: Secondary | ICD-10-CM | POA: Diagnosis not present

## 2016-05-05 DIAGNOSIS — Z3009 Encounter for other general counseling and advice on contraception: Secondary | ICD-10-CM

## 2016-05-05 DIAGNOSIS — Z01419 Encounter for gynecological examination (general) (routine) without abnormal findings: Secondary | ICD-10-CM

## 2016-05-05 DIAGNOSIS — N921 Excessive and frequent menstruation with irregular cycle: Secondary | ICD-10-CM

## 2016-05-05 NOTE — Progress Notes (Signed)
Patient ID: [name], female   DOB: 10/01/1990, 25 y.o.   MRN: [id number] History of Present Illness: Kaylee Miller is a 25 year old black female in for a well woman gyn exam, she has family planning medicaid and had a normal pap 02/08/15.She is having irregular bleeding and it is heavy at times, she is on depo.   Current Medications, Allergies, Past Medical History, Past Surgical History, Family History and Social History were reviewed in [employer] record.     Review of Systems:  Patient denies any headaches, hearing loss, fatigue, blurred vision, shortness of breath, chest pain, abdominal pain, problems with bowel movements, urination, or intercourse. No joint pain or mood swings.See HPI for positives.   Physical Exam:BP 100/80 (BP Location: Left Arm, Patient Position: Sitting, Cuff Size: Normal)   Pulse 64   Ht 5' 7.25" (1.708 m)   Wt 181 lb 8 oz (82.3 kg)   LMP 03/13/2016 Comment: period stopped 04/18/16  BMI 28.22 kg/m  General:  Well developed, well nourished, no acute distress Skin:  Warm and dry Neck:  Midline trachea, normal thyroid, good ROM, no lymphadenopathy Lungs; Clear to auscultation bilaterally Breast:  No dominant palpable mass, retraction, or nipple discharge Cardiovascular: Regular rate and rhythm Abdomen:  Soft, non tender, no hepatosplenomegaly Pelvic:  External genitalia is normal in appearance, no lesions.  The vagina is normal in appearance. Urethra has no lesions or masses. The cervix is smooth, GC/CHL obtained. Uterus is felt to be normal size, shape, and contour.  No adnexal masses or tenderness noted.Bladder is non tender, no masses felt. Extremities/musculoskeletal:  No swelling or varicosities noted, no clubbing or cyanosis Psych:  No mood changes, alert and cooperative,seems happy   Impression: 1. Well woman exam with routine gynecological exam   2. Family planning   3. Encounter for surveillance of injectable contraceptive    4. Irregular intermenstrual bleeding       Plan: GC/CHL sent Check HIV and RPR Call with any more bleeding, will rx megace Continue depo, has refills  Physical in 1 year

## 2016-05-05 NOTE — Patient Instructions (Signed)
Call with bleeding Physical in 1 year

## 2016-05-06 LAB — RPR: RPR: NONREACTIVE

## 2016-05-06 LAB — HIV ANTIBODY (ROUTINE TESTING W REFLEX): HIV Screen 4th Generation wRfx: NONREACTIVE

## 2016-05-07 LAB — GC/CHLAMYDIA PROBE AMP
CHLAMYDIA, DNA PROBE: NEGATIVE
NEISSERIA GONORRHOEAE BY PCR: NEGATIVE

## 2016-07-14 ENCOUNTER — Telehealth: Payer: Self-pay | Admitting: Adult Health

## 2016-07-14 NOTE — Telephone Encounter (Signed)
Left message that I called.

## 2016-07-14 NOTE — Telephone Encounter (Signed)
Pt called stating that she would like a call back from [employer] [employer] [facility], [place] states that she has  A question to ask. Please contact py

## 2016-07-16 ENCOUNTER — Telehealth: Payer: Self-pay | Admitting: *Deleted

## 2016-07-16 MED ORDER — MEGESTROL ACETATE 40 MG PO TABS: ORAL_TABLET | ORAL | 1 refills | Status: DC

## 2016-07-16 NOTE — Telephone Encounter (Signed)
Pt complains of bleeding on depo,next shot 11/27 will rx megace

## 2016-07-21 ENCOUNTER — Telehealth: Payer: Self-pay | Admitting: *Deleted

## 2016-07-21 ENCOUNTER — Encounter: Payer: Self-pay | Admitting: *Deleted

## 2016-07-21 ENCOUNTER — Ambulatory Visit (INDEPENDENT_AMBULATORY_CARE_PROVIDER_SITE_OTHER): Payer: Medicaid Other | Admitting: *Deleted

## 2016-07-21 DIAGNOSIS — Z308 Encounter for other contraceptive management: Secondary | ICD-10-CM

## 2016-07-21 LAB — POCT URINE PREGNANCY: PREG TEST UR: NEGATIVE

## 2016-07-21 MED ORDER — MEDROXYPROGESTERONE ACETATE 150 MG/ML IM SUSP
150.0000 mg | Freq: Once | INTRAMUSCULAR | Status: AC
  Administered 2016-07-21: 150 mg via INTRAMUSCULAR

## 2016-07-21 NOTE — Progress Notes (Signed)
Depo Provera 150 mg IM given left deltoid with no complications, Negative pregnancy test. Pt to return in 12 weeks for next injection.

## 2016-07-21 NOTE — Telephone Encounter (Signed)
[facility] pharmacist states they do have refills for the Depo Provera for pt to pick up today.

## 2016-10-17 ENCOUNTER — Ambulatory Visit (INDEPENDENT_AMBULATORY_CARE_PROVIDER_SITE_OTHER): Payer: Medicaid Other | Admitting: *Deleted

## 2016-10-17 DIAGNOSIS — Z308 Encounter for other contraceptive management: Secondary | ICD-10-CM

## 2016-10-17 DIAGNOSIS — Z3202 Encounter for pregnancy test, result negative: Secondary | ICD-10-CM

## 2016-10-17 DIAGNOSIS — Z3042 Encounter for surveillance of injectable contraceptive: Secondary | ICD-10-CM

## 2016-10-17 LAB — POCT URINE PREGNANCY: PREG TEST UR: NEGATIVE

## 2016-10-17 MED ORDER — MEDROXYPROGESTERONE ACETATE 150 MG/ML IM SUSP
150.0000 mg | Freq: Once | INTRAMUSCULAR | Status: AC
  Administered 2016-10-17: 150 mg via INTRAMUSCULAR

## 2016-10-17 NOTE — Progress Notes (Signed)
Pt was here today for Depo Provera injection, pt reports no problems at this time and UPT resulted negative.  Injection given in Rt Deltoid without problems and pt informed will need to call before next injection due to make appointment with [facility] to get more refills, pt instructed next injection due in 12 weeks.

## 2016-10-21 ENCOUNTER — Ambulatory Visit: Payer: Medicaid Other

## 2017-01-08 ENCOUNTER — Other Ambulatory Visit: Payer: Self-pay | Admitting: Adult Health

## 2017-01-09 ENCOUNTER — Ambulatory Visit (INDEPENDENT_AMBULATORY_CARE_PROVIDER_SITE_OTHER): Payer: 59 | Admitting: *Deleted

## 2017-01-09 ENCOUNTER — Encounter: Payer: Self-pay | Admitting: *Deleted

## 2017-01-09 ENCOUNTER — Telehealth: Payer: Self-pay | Admitting: Adult Health

## 2017-01-09 DIAGNOSIS — Z308 Encounter for other contraceptive management: Secondary | ICD-10-CM

## 2017-01-09 DIAGNOSIS — Z3202 Encounter for pregnancy test, result negative: Secondary | ICD-10-CM

## 2017-01-09 LAB — POCT URINE PREGNANCY: Preg Test, Ur: NEGATIVE

## 2017-01-09 MED ORDER — MEDROXYPROGESTERONE ACETATE 150 MG/ML IM SUSP
150.0000 mg | Freq: Once | INTRAMUSCULAR | Status: AC
  Administered 2017-01-09: 150 mg via INTRAMUSCULAR

## 2017-01-09 NOTE — Progress Notes (Signed)
Pt here for Depo. Pt tolerated shot well. Return in 12 weeks for next shot. JSY 

## 2017-04-03 ENCOUNTER — Ambulatory Visit: Payer: 59

## 2017-04-07 ENCOUNTER — Ambulatory Visit (INDEPENDENT_AMBULATORY_CARE_PROVIDER_SITE_OTHER): Payer: 59 | Admitting: *Deleted

## 2017-04-07 ENCOUNTER — Encounter: Payer: Self-pay | Admitting: *Deleted

## 2017-04-07 DIAGNOSIS — Z3202 Encounter for pregnancy test, result negative: Secondary | ICD-10-CM

## 2017-04-07 DIAGNOSIS — Z3042 Encounter for surveillance of injectable contraceptive: Secondary | ICD-10-CM

## 2017-04-07 DIAGNOSIS — Z308 Encounter for other contraceptive management: Secondary | ICD-10-CM

## 2017-04-07 LAB — POCT URINE PREGNANCY: Preg Test, Ur: NEGATIVE

## 2017-04-07 MED ORDER — MEDROXYPROGESTERONE ACETATE 150 MG/ML IM SUSP
150.0000 mg | Freq: Once | INTRAMUSCULAR | Status: AC
  Administered 2017-04-07: 150 mg via INTRAMUSCULAR

## 2017-04-07 NOTE — Progress Notes (Signed)
Pt here for Depo. Pt tolerated shot well. Return in 12 weeks for next shot. JSY 

## 2017-06-01 ENCOUNTER — Ambulatory Visit (INDEPENDENT_AMBULATORY_CARE_PROVIDER_SITE_OTHER): Payer: 59 | Admitting: Adult Health

## 2017-06-01 ENCOUNTER — Encounter (INDEPENDENT_AMBULATORY_CARE_PROVIDER_SITE_OTHER): Payer: Self-pay

## 2017-06-01 ENCOUNTER — Encounter: Payer: Self-pay | Admitting: Adult Health

## 2017-06-01 ENCOUNTER — Other Ambulatory Visit (HOSPITAL_COMMUNITY)
Admission: RE | Admit: 2017-06-01 | Discharge: 2017-06-01 | Disposition: A | Discharge status: Home / Self Care | Payer: 59 | Source: Ambulatory Visit | Attending: Adult Health | Admitting: Adult Health

## 2017-06-01 VITALS — BP 118/78 | HR 76 | Resp 20 | Ht 68.5 in | Wt 188.0 lb

## 2017-06-01 DIAGNOSIS — N921 Excessive and frequent menstruation with irregular cycle: Secondary | ICD-10-CM | POA: Diagnosis not present

## 2017-06-01 DIAGNOSIS — Z01419 Encounter for gynecological examination (general) (routine) without abnormal findings: Secondary | ICD-10-CM

## 2017-06-01 DIAGNOSIS — Z01411 Encounter for gynecological examination (general) (routine) with abnormal findings: Secondary | ICD-10-CM | POA: Diagnosis not present

## 2017-06-01 DIAGNOSIS — Z3009 Encounter for other general counseling and advice on contraception: Secondary | ICD-10-CM | POA: Diagnosis not present

## 2017-06-01 DIAGNOSIS — D229 Melanocytic nevi, unspecified: Secondary | ICD-10-CM | POA: Diagnosis not present

## 2017-06-01 DIAGNOSIS — Z3042 Encounter for surveillance of injectable contraceptive: Secondary | ICD-10-CM

## 2017-06-01 MED ORDER — MEGESTROL ACETATE 40 MG PO TABS: ORAL_TABLET | ORAL | 1 refills | Status: DC

## 2017-06-01 MED ORDER — MEDROXYPROGESTERONE ACETATE 150 MG/ML IM SUSY: 1.0000 mL | PREFILLED_SYRINGE | INTRAMUSCULAR | 4 refills | Status: DC

## 2017-06-01 NOTE — Progress Notes (Signed)
Patient ID: Kaylee Miller, female   DOB: Apr 15, 1991, 26 y.o.   MRN: [id number] History of Present Illness: Kaylee Miller is a 26 year old white female, in for well woman gyn exam and pap. She has family planning medicaid too.   Current Medications, Allergies, Past Medical History, Past Surgical History, Family History and Social History were reviewed in [employer] record.     Review of Systems: Patient denies any headaches, hearing loss, fatigue, blurred vision, shortness of breath, chest pain, abdominal pain, problems with bowel movements, urination, or intercourse. No joint pain or mood swings.+irregular bleeding with depo.    Physical Exam:BP 118/78 (BP Location: Left Arm, Patient Position: Sitting, Cuff Size: Normal)   Pulse 76   Resp 20   Ht 5' 8.5" (1.74 m)   Wt 188 lb (85.3 kg)   LMP 05/25/2017   SpO2 98%   BMI 28.17 kg/m  General:  Well developed, well nourished, no acute distress Skin:  Warm and dry Neck:  Midline trachea, normal thyroid, good ROM, no lymphadenopathy Lungs; Clear to auscultation bilaterally Breast:  No dominant palpable mass, retraction, or nipple discharge,Has 1 cm slightly raised black mole upper abdomen below bra line.  Cardiovascular: Regular rate and rhythm Abdomen:  Soft, non tender, no hepatosplenomegaly Pelvic:  External genitalia is normal in appearance, no lesions.  The vagina is normal in appearance. Urethra has no lesions or masses. The cervix is bulbous.Pap with GC/CHL performed.   Uterus is felt to be normal size, shape, and contour.  No adnexal masses or tenderness noted.Bladder is non tender, no masses felt. Extremities/musculoskeletal:  No swelling or varicosities noted, no clubbing or cyanosis Psych:  No mood changes, alert and cooperative,seems happy PHQ 9 score 6, denies being suicidal, and declines meds, told about EAP and she is interested.   Impression: 1. Well woman exam with routine gynecological exam   2.  Family planning   3. Encounter for surveillance of injectable contraceptive   4. Irregular intermenstrual bleeding   5. Atypical mole       Plan: Check HIV and RPR Meds ordered this encounter  Medications  . MedroxyPROGESTERone Acetate 150 MG/ML SUSY    Sig: Inject 1 mL (150 mg total) into the muscle every 3 (three) months.    Dispense:  1 mL    Refill:  4    Please consider 90 day supplies to promote better adherence    Order Specific Question:   Supervising Provider    Answer:   Elonda Husky, LUTHER H [[id number]]  . megestrol (MEGACE) 40 MG tablet    Sig: Take 2 daily if having bleeding    Dispense:  60 tablet    Refill:  1    Order Specific Question:   Supervising Provider    Answer:   Florian Buff [[id number]]   Call dermatologist about mole Physical in 1 year, pap in 3 if normal Call EAP

## 2017-06-01 NOTE — Patient Instructions (Signed)
Call dermatologist Physical in 1 year

## 2017-06-02 LAB — CYTOLOGY - PAP
ADEQUACY: ABSENT
CHLAMYDIA, DNA PROBE: NEGATIVE
DIAGNOSIS: NEGATIVE
NEISSERIA GONORRHEA: NEGATIVE

## 2017-06-02 LAB — RPR: RPR: NONREACTIVE

## 2017-06-02 LAB — HIV ANTIBODY (ROUTINE TESTING W REFLEX): HIV Screen 4th Generation wRfx: NONREACTIVE

## 2017-06-28 ENCOUNTER — Other Ambulatory Visit: Payer: Self-pay

## 2017-06-28 ENCOUNTER — Emergency Department (HOSPITAL_COMMUNITY)
Admission: EM | Admit: 2017-06-28 | Discharge: 2017-06-29 | Disposition: A | Discharge status: Home / Self Care | Payer: 59 | Attending: Emergency Medicine | Admitting: Emergency Medicine

## 2017-06-28 ENCOUNTER — Emergency Department (HOSPITAL_COMMUNITY): Payer: 59

## 2017-06-28 ENCOUNTER — Encounter (HOSPITAL_COMMUNITY): Payer: Self-pay

## 2017-06-28 DIAGNOSIS — Z91040 Latex allergy status: Secondary | ICD-10-CM | POA: Insufficient documentation

## 2017-06-28 DIAGNOSIS — F17210 Nicotine dependence, cigarettes, uncomplicated: Secondary | ICD-10-CM | POA: Insufficient documentation

## 2017-06-28 DIAGNOSIS — R072 Precordial pain: Secondary | ICD-10-CM | POA: Insufficient documentation

## 2017-06-28 DIAGNOSIS — Z79899 Other long term (current) drug therapy: Secondary | ICD-10-CM | POA: Diagnosis not present

## 2017-06-28 DIAGNOSIS — R079 Chest pain, unspecified: Secondary | ICD-10-CM

## 2017-06-28 DIAGNOSIS — R0602 Shortness of breath: Secondary | ICD-10-CM | POA: Diagnosis not present

## 2017-06-28 DIAGNOSIS — R05 Cough: Secondary | ICD-10-CM | POA: Diagnosis not present

## 2017-06-28 LAB — CBC
HCT: 40.5 % (ref 36.0–46.0)
Hemoglobin: 13.7 g/dL (ref 12.0–15.0)
MCH: 28.5 pg (ref 26.0–34.0)
MCHC: 33.8 g/dL (ref 30.0–36.0)
MCV: 84.4 fL (ref 78.0–100.0)
PLATELETS: 225 10*3/uL (ref 150–400)
RBC: 4.8 MIL/uL (ref 3.87–5.11)
RDW: 13.3 % (ref 11.5–15.5)
WBC: 8.5 10*3/uL (ref 4.0–10.5)

## 2017-06-28 LAB — COMPREHENSIVE METABOLIC PANEL
ALT: 15 U/L (ref 14–54)
ANION GAP: 10 (ref 5–15)
AST: 16 U/L (ref 15–41)
Albumin: 4.1 g/dL (ref 3.5–5.0)
Alkaline Phosphatase: 54 U/L (ref 38–126)
BILIRUBIN TOTAL: 0.3 mg/dL (ref 0.3–1.2)
BUN: 17 mg/dL (ref 6–20)
CHLORIDE: 107 mmol/L (ref 101–111)
CO2: 24 mmol/L (ref 22–32)
Calcium: 9.6 mg/dL (ref 8.9–10.3)
Creatinine, Ser: 0.82 mg/dL (ref 0.44–1.00)
Glucose, Bld: 91 mg/dL (ref 65–99)
POTASSIUM: 3.9 mmol/L (ref 3.5–5.1)
Sodium: 141 mmol/L (ref 135–145)
TOTAL PROTEIN: 7.8 g/dL (ref 6.5–8.1)

## 2017-06-28 LAB — TROPONIN I

## 2017-06-28 MED ORDER — GI COCKTAIL ~~LOC~~
30.0000 mL | Freq: Once | ORAL | Status: AC
  Administered 2017-06-29: 30 mL via ORAL
  Filled 2017-06-28: qty 30

## 2017-06-28 NOTE — ED Notes (Signed)
Pt complains of midsternal CP for the last hour thru to the back  Reports her father had MI prior to age 26   abd soft non tender, NSR,

## 2017-06-28 NOTE — ED Provider Notes (Addendum)
[facility] EMERGENCY DEPARTMENT Provider Note   CSN: [id number] Arrival date & time: 06/28/17  2157     History   Chief Complaint Chief Complaint  Patient presents with  . Chest Pain    raidating through to back    HPI Kaylee Miller is a 26 y.o. female.  HPI  26 year old female resents today complaining of onset of substernal sharp chest pain that feels stabbing in nature.  She also felt like she has had some emesis in the back of her throat.  She had no associated symptoms.  It does radiate through to her back.  She does not have any dyspnea.  She denies any history of blood clots or DVT.  She has not had similar symptoms in the past.  She denies any fever or chills.  Past Medical History:  Diagnosis Date  . Contraceptive management 02/08/2015  . Headache(784.0)    migraines  . History of bronchitis   . HSV-2 (herpes simplex virus 2) infection   . Pregnant   . Seasonal allergies     Patient Active Problem List   Diagnosis Date Noted  . Encounter for surveillance of injectable contraceptive 06/01/2017  . Well woman exam with routine gynecological exam 06/01/2017  . Atypical mole 06/01/2017  . Irregular intermenstrual bleeding 06/01/2017  . Contraceptive management 02/08/2015  . 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 S/P C-section 07/28/2013  . Migraines 01/05/2013  . HSV-2 (herpes simplex virus 2) infection 01/05/2013    Past Surgical History:  Procedure Laterality Date  . adenoids    . CESAREAN SECTION  2007  . tubes in ears      OB History    Gravida Para Term Preterm AB Living   2 2 2     2    SAB TAB Ectopic Multiple Live Births           2       Home Medications    Prior to Admission medications   Medication Sig Start Date End Date Taking? Authorizing Provider  acetaminophen (TYLENOL) 500 MG tablet Take 1,500 mg every 6 (six) hours as needed by mouth for moderate pain or headache.   Yes [provider]  MedroxyPROGESTERone  Acetate 150 MG/ML SUSY Inject 1 mL (150 mg total) into the muscle every 3 (three) months. 06/01/17  Yes Estill Dooms, NP  megestrol (MEGACE) 40 MG tablet Take 2 daily if having bleeding Patient taking differently: Take 80 mg daily as needed by mouth (for bleeding). Take 2 daily if having bleeding 06/01/17  Yes Derrek Monaco A, NP  albuterol (PROVENTIL HFA) 108 (90 Base) MCG/ACT inhaler Inhale 2 puffs into the lungs every 6 (six) hours as needed for wheezing or shortness of breath.    [provider]    Family History Family History  Problem Relation Age of Onset  . Cancer Mother   . Hypertension Father   . Stroke Father   . CAD Maternal Grandfather   . CAD Paternal Grandmother   . CAD Paternal Grandfather   . Diabetes Other        great-great grandmother  . CAD Other     Social History Social History   Tobacco Use  . Smoking status: Current Some Day Smoker    Packs/day: 1.00    Years: 9.00    Pack years: 9.00    Types: Cigarettes  . Smokeless tobacco: Never Used  Substance Use Topics  . Alcohol use: Yes  Alcohol/week: 0.0 oz    Comment: occasional  . Drug use: No     Allergies   Latex   Review of Systems Review of Systems  All other systems reviewed and are negative.    Physical Exam Updated Vital Signs BP 104/80 (BP Location: Right Arm)   Pulse 79   Temp 98.6 F (37 C) (Oral)   Resp 18   Ht 1.715 m (5' 7.5")   Wt 85.7 kg (189 lb)   LMP  (LMP Unknown)   SpO2 100%   BMI 29.16 kg/m   Physical Exam  Constitutional: She appears well-developed and well-nourished.  HENT:  Head: Normocephalic and atraumatic.  Eyes: Pupils are equal, round, and reactive to light.  Neck: Normal range of motion.  Cardiovascular: Normal rate, regular rhythm and normal pulses.  Pulmonary/Chest: Effort normal and breath sounds normal.  Abdominal:    Nursing note and vitals reviewed.    ED Treatments / Results  Labs (all labs ordered are listed, but  only abnormal results are displayed) Labs Reviewed  CBC  TROPONIN I  COMPREHENSIVE METABOLIC PANEL    EKG  EKG Interpretation  Date/Time:  Sunday June 28 2017 22:30:13 EST Ventricular Rate:  73 PR Interval:    QRS Duration: 84 QT Interval:  366 QTC Calculation: 404 R Axis:   38 Text Interpretation:  Normal sinus rhythm Confirmed by Kandi Brusseau ([id number]) on 06/28/2017 11:54:23 PM       Radiology Dg Chest 2 View  Result Date: 06/28/2017 CLINICAL DATA:  Cough and congestion.  Shortness of breath. EXAM: CHEST  2 VIEW COMPARISON:  08/03/2014 FINDINGS: The heart size and mediastinal contours are within normal limits. Both lungs are clear. The visualized skeletal structures are unremarkable. IMPRESSION: No active cardiopulmonary disease. Electronically Signed   By: William  Stevens M.D.   On: 06/28/2017 23:45    Procedures Procedures (including critical care time)  Medications Ordered in ED Medications  gi cocktail (Maalox,Lidocaine,Donnatal) (not administered)     Initial Impression / Assessment and Plan / ED Course  I have reviewed the triage vital signs and the nursing notes.  Pertinent labs & imaging results that were available during my care of the patient were reviewed by me and considered in my medical decision making (see chart for details).     26  year old female substernal chest pain with normal EKG and normal chest x-Renarda Mullinix.  She has symptoms most consistent with reflux.  She is given GI cocktail here with some decrease in symptoms.  We have discussed return precautions and need for follow-up and she voices understanding. Final Clinical Impressions(s) / ED Diagnoses   Final diagnoses:  Chest pain, unspecified type    ED Discharge Orders    None       Pattricia Boss, MD 06/29/17 0881    Pattricia Boss, MD 06/29/17 [phone]

## 2017-06-28 NOTE — ED Triage Notes (Signed)
Pt reports chest pain radiating through to back that has been intermittent for the past hour. No other symptoms reported.

## 2017-06-29 DIAGNOSIS — F17210 Nicotine dependence, cigarettes, uncomplicated: Secondary | ICD-10-CM | POA: Diagnosis not present

## 2017-06-29 DIAGNOSIS — Z91040 Latex allergy status: Secondary | ICD-10-CM | POA: Diagnosis not present

## 2017-06-29 DIAGNOSIS — Z79899 Other long term (current) drug therapy: Secondary | ICD-10-CM | POA: Diagnosis not present

## 2017-06-29 DIAGNOSIS — R072 Precordial pain: Secondary | ICD-10-CM | POA: Diagnosis not present

## 2017-06-30 ENCOUNTER — Ambulatory Visit: Payer: 59

## 2017-07-01 ENCOUNTER — Ambulatory Visit (INDEPENDENT_AMBULATORY_CARE_PROVIDER_SITE_OTHER): Payer: 59 | Admitting: *Deleted

## 2017-07-01 DIAGNOSIS — Z3202 Encounter for pregnancy test, result negative: Secondary | ICD-10-CM

## 2017-07-01 DIAGNOSIS — Z3042 Encounter for surveillance of injectable contraceptive: Secondary | ICD-10-CM | POA: Diagnosis not present

## 2017-07-01 LAB — POCT URINE PREGNANCY: Preg Test, Ur: NEGATIVE

## 2017-07-01 MED ORDER — MEDROXYPROGESTERONE ACETATE 150 MG/ML IM SUSP
150.0000 mg | Freq: Once | INTRAMUSCULAR | Status: AC
  Administered 2017-07-01: 150 mg via INTRAMUSCULAR

## 2017-07-01 NOTE — Progress Notes (Signed)
Patient in for depo injection. She has no problems or concerns. Next due Jan 23-Feb 6.

## 2017-09-23 ENCOUNTER — Ambulatory Visit (INDEPENDENT_AMBULATORY_CARE_PROVIDER_SITE_OTHER): Payer: 59

## 2017-09-23 VITALS — Wt 188.0 lb

## 2017-09-23 DIAGNOSIS — Z3042 Encounter for surveillance of injectable contraceptive: Secondary | ICD-10-CM

## 2017-09-23 DIAGNOSIS — Z3202 Encounter for pregnancy test, result negative: Secondary | ICD-10-CM

## 2017-09-23 LAB — POCT URINE PREGNANCY: Preg Test, Ur: NEGATIVE

## 2017-09-23 MED ORDER — MEDROXYPROGESTERONE ACETATE 150 MG/ML IM SUSP
150.0000 mg | Freq: Once | INTRAMUSCULAR | Status: AC
  Administered 2017-09-23: 150 mg via INTRAMUSCULAR

## 2017-09-23 NOTE — Progress Notes (Signed)
Pt here for depo injection 150 mg IM given rt deltoid. Tolerated well. Return 12 weeks for next injection. Pad CMA 

## 2017-10-06 DIAGNOSIS — M545 Low back pain: Secondary | ICD-10-CM | POA: Diagnosis not present

## 2017-11-16 DIAGNOSIS — H5213 Myopia, bilateral: Secondary | ICD-10-CM | POA: Diagnosis not present

## 2017-11-16 DIAGNOSIS — H52223 Regular astigmatism, bilateral: Secondary | ICD-10-CM | POA: Diagnosis not present

## 2017-12-09 ENCOUNTER — Telehealth: Payer: Self-pay | Admitting: Adult Health

## 2017-12-09 NOTE — Telephone Encounter (Signed)
Patient called stating that she is having sever cramps when she has her period, pt states it is so bad she can't walk. I let patient know we do not have any appointment's until next week Wednesday. Please contact pt

## 2017-12-10 MED ORDER — KETOROLAC TROMETHAMINE 10 MG PO TABS: 10.0000 mg | ORAL_TABLET | Freq: Four times a day (QID) | ORAL | 0 refills | Status: DC | PRN

## 2017-12-10 NOTE — Telephone Encounter (Signed)
Pt reports that when she has her cycle she is having severe pain.She uses depo for birth control. She doesn't have regular periods just bleeding from time to time. When the bleeding starts the pain is very bad. She is bent over in pain and can't work. Yesterday her period started and she was in pain from 3pm to 10pm and it did not respond to ibuprofen 800 mg. She wanted to know if she can have something to help with the pain.

## 2017-12-10 NOTE — Telephone Encounter (Signed)
Having really bad period cramps, make appt.Will rx toradol

## 2017-12-16 ENCOUNTER — Encounter (INDEPENDENT_AMBULATORY_CARE_PROVIDER_SITE_OTHER): Payer: Self-pay

## 2017-12-16 ENCOUNTER — Ambulatory Visit (INDEPENDENT_AMBULATORY_CARE_PROVIDER_SITE_OTHER): Payer: 59

## 2017-12-16 VITALS — Ht 67.0 in | Wt 193.4 lb

## 2017-12-16 DIAGNOSIS — Z3202 Encounter for pregnancy test, result negative: Secondary | ICD-10-CM | POA: Diagnosis not present

## 2017-12-16 DIAGNOSIS — Z3042 Encounter for surveillance of injectable contraceptive: Secondary | ICD-10-CM | POA: Diagnosis not present

## 2017-12-16 LAB — POCT URINE PREGNANCY: Preg Test, Ur: NEGATIVE

## 2017-12-16 MED ORDER — MEDROXYPROGESTERONE ACETATE 150 MG/ML IM SUSP
150.0000 mg | Freq: Once | INTRAMUSCULAR | Status: AC
  Administered 2017-12-16: 150 mg via INTRAMUSCULAR

## 2017-12-16 NOTE — Addendum Note (Signed)
Addended by: Diona Fanti A on: 12/16/2017 09:43 AM   Modules accepted: Level of Service

## 2017-12-16 NOTE — Progress Notes (Signed)
PT here for depo injection 150 mg IM given LT Deltoid. Tolerated well. Return 12 weeks for next injection. Pad CMA

## 2017-12-18 ENCOUNTER — Other Ambulatory Visit: Payer: Self-pay

## 2017-12-18 ENCOUNTER — Ambulatory Visit (INDEPENDENT_AMBULATORY_CARE_PROVIDER_SITE_OTHER): Payer: 59 | Admitting: Women's Health

## 2017-12-18 ENCOUNTER — Ambulatory Visit: Payer: 59 | Admitting: Women's Health

## 2017-12-18 ENCOUNTER — Encounter: Payer: Self-pay | Admitting: Women's Health

## 2017-12-18 VITALS — BP 108/68 | HR 90 | Ht 67.0 in | Wt 190.0 lb

## 2017-12-18 DIAGNOSIS — N946 Dysmenorrhea, unspecified: Secondary | ICD-10-CM

## 2017-12-18 DIAGNOSIS — L0292 Furuncle, unspecified: Secondary | ICD-10-CM | POA: Diagnosis not present

## 2017-12-18 DIAGNOSIS — N926 Irregular menstruation, unspecified: Secondary | ICD-10-CM | POA: Diagnosis not present

## 2017-12-18 MED ORDER — SULFAMETHOXAZOLE-TRIMETHOPRIM 800-160 MG PO TABS: 1.0000 | ORAL_TABLET | Freq: Two times a day (BID) | ORAL | 0 refills | Status: DC

## 2017-12-18 NOTE — Progress Notes (Signed)
   GYN VISIT Patient name: Kaylee Miller MRN [id number]  Date of birth: June 15, 1991 Chief Complaint:   Menstrual Problem  History of Present Illness:   Kaylee Miller is a 27 y.o. G27P2002 African American female being seen today for report of painful cramps w/ irregular periods x 1 month. Has been on depo x 30yrs w/o problems. Denies abnormal discharge, itching/odor/irritation.  Also reports painful bump on mons that came up day after shaving, gets them all the time with shaving.      Patient's last menstrual period was 12/07/2017. The current method of family planning is Depo-Provera injections. Last pap Oct 2018. Results were:  normal Review of Systems:   Pertinent items are noted in HPI Denies fever/chills, dizziness, headaches, visual disturbances, fatigue, shortness of breath, chest pain, abdominal pain, vomiting, abnormal vaginal discharge/itching/odor/irritation, problems with periods, bowel movements, urination, or intercourse unless otherwise stated above.  Pertinent History Reviewed:  Reviewed past medical,surgical, social, obstetrical and family history.  Reviewed problem list, medications and allergies. Physical Assessment:   Vitals:   12/18/17 1220  BP: 108/68  Pulse: 90  Weight: 190 lb (86.2 kg)  Height: 5\' 7"  (1.702 m)  Body mass index is 29.76 kg/m.       Physical Examination:   General appearance: alert, well appearing, and in no distress  Mental status: alert, oriented to person, place, and time  Skin: warm & dry   Cardiovascular: normal heart rate noted  Respiratory: normal respiratory effort, no distress  Abdomen: soft, non-tender   Pelvic: VULVA: ~1.5cm firm boil in center of mons, possible folliculitis from ingrown hair, no head or drainage, slightly tender to touch. VAGINA: normal appearing vagina with normal color and discharge, no lesions, CERVIX: normal appearing cervix without discharge or lesions  Extremities: no edema   No results found for this  or any previous visit (from the past 24 hour(s)).  Assessment & Plan:  1) Dysmenorrhea w/ irregular periods on depo x 54mth> Nuswab plus sent, will discuss poc when results return  2) Boil/folliculitis on mons> rx septra bid x 7d, warm compresses, can try getting new blade every time she shaves or try waxing  Meds:  Meds ordered this encounter  Medications  . sulfamethoxazole-trimethoprim (BACTRIM DS,SEPTRA DS) 800-160 MG tablet    Sig: Take 1 tablet by mouth 2 (two) times daily. X 7 days    Dispense:  14 tablet    Refill:  0    Order Specific Question:   Supervising Provider    Answer:   Tania Ade H [[id number]]    Orders Placed This Encounter  Procedures  . NuSwab Vaginitis Plus (VG+)    Return for after 10/8 for physical.  Roma Schanz CNM, [facility] 12/18/2017 12:41 PM

## 2017-12-23 ENCOUNTER — Telehealth: Payer: Self-pay | Admitting: *Deleted

## 2017-12-23 ENCOUNTER — Other Ambulatory Visit: Payer: Self-pay | Admitting: Women's Health

## 2017-12-23 LAB — NUSWAB VAGINITIS PLUS (VG+)
ATOPOBIUM VAGINAE: HIGH {score} — AB
BVAB 2: HIGH {score} — AB
CANDIDA ALBICANS, NAA: NEGATIVE
CANDIDA GLABRATA, NAA: NEGATIVE
Chlamydia trachomatis, NAA: NEGATIVE
MEGASPHAERA 1: HIGH {score} — AB
Neisseria gonorrhoeae, NAA: NEGATIVE
Trich vag by NAA: NEGATIVE

## 2017-12-23 MED ORDER — METRONIDAZOLE 500 MG PO TABS: 500.0000 mg | ORAL_TABLET | Freq: Two times a day (BID) | ORAL | 0 refills | Status: DC

## 2017-12-23 NOTE — Telephone Encounter (Signed)
Informed patient swab showed BV.  Kaylee Miller sent Flagyl to pharmacy and no sex or alcohol while taking.  Advised she could take both antibiotics at the same time.  Also, If she continues to bleed after finishing the medication, to give her a call.  Pt verbalized understanding.

## 2017-12-29 ENCOUNTER — Telehealth: Payer: Self-pay | Admitting: *Deleted

## 2017-12-29 NOTE — Telephone Encounter (Signed)
Patient called back stating she was sent home from work because she is itching terribly from her rash. Advised patient to not take either antibiotic and to take 50mg  Benadryl.  Pt verbalized understanding.
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
## 2017-12-31 ENCOUNTER — Telehealth: Payer: Self-pay | Admitting: *Deleted

## 2017-12-31 NOTE — Telephone Encounter (Signed)
Informed patient if symptoms have resolved, nothing further is needed at this time.  Patient states it is better and rash is now gone.  Advised not to take either medication again.

## 2018-02-03 ENCOUNTER — Encounter: Payer: Self-pay | Admitting: Family Medicine

## 2018-02-03 ENCOUNTER — Ambulatory Visit: Payer: Self-pay | Admitting: Family Medicine

## 2018-02-03 VITALS — BP 120/82 | HR 82 | Temp 99.1°F | Ht 67.0 in | Wt 187.0 lb

## 2018-02-03 DIAGNOSIS — Z0000 Encounter for general adult medical examination without abnormal findings: Secondary | ICD-10-CM

## 2018-02-03 LAB — POCT URINALYSIS DIPSTICK
Bilirubin, UA: NEGATIVE
Blood, UA: NEGATIVE
GLUCOSE UA: NEGATIVE
Ketones, UA: NEGATIVE
LEUKOCYTES UA: NEGATIVE
NITRITE UA: NEGATIVE
PROTEIN UA: NEGATIVE
Spec Grav, UA: 1.025 (ref 1.010–1.025)
Urobilinogen, UA: 0.2 E.U./dL
pH, UA: 5.5 (ref 5.0–8.0)

## 2018-02-03 LAB — POCT URINE PREGNANCY: PREG TEST UR: NEGATIVE

## 2018-02-03 NOTE — Patient Instructions (Signed)
Preventive Care 18-39 Years, Female Preventive care refers to lifestyle choices and visits with your health care provider that can promote health and wellness. What does preventive care include?  A yearly physical exam. This is also called an annual well check.  Dental exams once or twice a year.  Routine eye exams. Ask your health care provider how often you should have your eyes checked.  Personal lifestyle choices, including: ? Daily care of your teeth and gums. ? Regular physical activity. ? Eating a healthy diet. ? Avoiding tobacco and drug use. ? Limiting alcohol use. ? Practicing safe sex. ? Taking vitamin and mineral supplements as recommended by your health care provider. What happens during an annual well check? The services and screenings done by your health care provider during your annual well check will depend on your age, overall health, lifestyle risk factors, and family history of disease. Counseling Your health care provider may ask you questions about your:  Alcohol use.  Tobacco use.  Drug use.  Emotional well-being.  Home and relationship well-being.  Sexual activity.  Eating habits.  Work and work Statistician.  Method of birth control.  Menstrual cycle.  Pregnancy history.  Screening You may have the following tests or measurements:  Height, weight, and BMI.  Diabetes screening. This is done by checking your blood sugar (glucose) after you have not eaten for a while (fasting).  Blood pressure.  Lipid and cholesterol levels. These may be checked every 5 years starting at age 66.  Skin check.  Hepatitis C blood test.  Hepatitis B blood test.  Sexually transmitted disease (STD) testing.  BRCA-related cancer screening. This may be done if you have a family history of breast, ovarian, tubal, or peritoneal cancers.  Pelvic exam and Pap test. This may be done every 3 years starting at age 40. Starting at age 59, this may be done every 5  years if you have a Pap test in combination with an HPV test.  Discuss your test results, treatment options, and if necessary, the need for more tests with your health care provider. Vaccines Your health care provider may recommend certain vaccines, such as:  Influenza vaccine. This is recommended every year.  Tetanus, diphtheria, and acellular pertussis (Tdap, Td) vaccine. You may need a Td booster every 10 years.  Varicella vaccine. You may need this if you have not been vaccinated.  HPV vaccine. If you are 69 or younger, you may need three doses over 6 months.  Measles, mumps, and rubella (MMR) vaccine. You may need at least one dose of MMR. You may also need a second dose.  Pneumococcal 13-valent conjugate (PCV13) vaccine. You may need this if you have certain conditions and were not previously vaccinated.  Pneumococcal polysaccharide (PPSV23) vaccine. You may need one or two doses if you smoke cigarettes or if you have certain conditions.  Meningococcal vaccine. One dose is recommended if you are age 27-21 years and a first-year college student living in a residence hall, or if you have one of several medical conditions. You may also need additional booster doses.  Hepatitis A vaccine. You may need this if you have certain conditions or if you travel or work in places where you may be exposed to hepatitis A.  Hepatitis B vaccine. You may need this if you have certain conditions or if you travel or work in places where you may be exposed to hepatitis B.  Haemophilus influenzae type b (Hib) vaccine. You may need this if  you have certain risk factors.  Talk to your health care provider about which screenings and vaccines you need and how often you need them. This information is not intended to replace advice given to you by your health care provider. Make sure you discuss any questions you have with your health care provider. Document Released: 10/07/2001 Document Revised: 04/30/2016  Document Reviewed: 06/12/2015 Elsevier Interactive Patient Education  [employer].
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
## 2018-03-09 ENCOUNTER — Telehealth: Payer: Self-pay | Admitting: *Deleted

## 2018-03-09 MED ORDER — FLUCONAZOLE 150 MG PO TABS
ORAL_TABLET | ORAL | 1 refills | Status: DC
Start: 2018-03-09 — End: 2018-08-26

## 2018-03-09 NOTE — Telephone Encounter (Signed)
Pt wants something for yeast infection, will rx diflucan

## 2018-03-09 NOTE — Telephone Encounter (Signed)
Patient states she is having vaginal itching, no discharge with odor.  Has not tried anything over the counter.  States she has a [employer] card and in order to use it, prescription has to be sent in.  Monistat or Diflucan is fine with patient. Please advise.

## 2018-03-10 ENCOUNTER — Encounter: Payer: Self-pay | Admitting: *Deleted

## 2018-03-10 ENCOUNTER — Ambulatory Visit (INDEPENDENT_AMBULATORY_CARE_PROVIDER_SITE_OTHER): Payer: 59 | Admitting: *Deleted

## 2018-03-10 DIAGNOSIS — Z3202 Encounter for pregnancy test, result negative: Secondary | ICD-10-CM | POA: Diagnosis not present

## 2018-03-10 DIAGNOSIS — Z3042 Encounter for surveillance of injectable contraceptive: Secondary | ICD-10-CM

## 2018-03-10 DIAGNOSIS — Z308 Encounter for other contraceptive management: Secondary | ICD-10-CM

## 2018-03-10 LAB — POCT URINE PREGNANCY: PREG TEST UR: NEGATIVE

## 2018-03-10 MED ORDER — MEDROXYPROGESTERONE ACETATE 150 MG/ML IM SUSP
150.0000 mg | Freq: Once | INTRAMUSCULAR | Status: AC
  Administered 2018-03-10: 150 mg via INTRAMUSCULAR

## 2018-03-10 NOTE — Progress Notes (Signed)
Pt here for Depo. Pt tolerated shot well. Pt received shot in right deltoid. Return in 12 weeks for next shot. [place]

## 2018-03-24 ENCOUNTER — Ambulatory Visit (INDEPENDENT_AMBULATORY_CARE_PROVIDER_SITE_OTHER): Payer: 59 | Admitting: Adult Health

## 2018-03-24 ENCOUNTER — Encounter: Payer: Self-pay | Admitting: Adult Health

## 2018-03-24 VITALS — BP 117/72 | HR 75 | Ht 67.0 in | Wt 191.0 lb

## 2018-03-24 DIAGNOSIS — N898 Other specified noninflammatory disorders of vagina: Secondary | ICD-10-CM | POA: Diagnosis not present

## 2018-03-24 DIAGNOSIS — Z113 Encounter for screening for infections with a predominantly sexual mode of transmission: Secondary | ICD-10-CM | POA: Diagnosis not present

## 2018-03-24 LAB — POCT WET PREP (WET MOUNT)
CLUE CELLS WET PREP WHIFF POC: NEGATIVE
WBC WET PREP: POSITIVE
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
## 2018-03-25 LAB — HIV ANTIBODY (ROUTINE TESTING W REFLEX): HIV Screen 4th Generation wRfx: NONREACTIVE

## 2018-03-25 LAB — RPR: RPR: NONREACTIVE

## 2018-03-29 ENCOUNTER — Telehealth: Payer: Self-pay | Admitting: Adult Health

## 2018-03-29 LAB — NUSWAB VAGINITIS PLUS (VG+)
Candida albicans, NAA: NEGATIVE
Candida glabrata, NAA: NEGATIVE
Chlamydia trachomatis, NAA: NEGATIVE
Neisseria gonorrhoeae, NAA: NEGATIVE
TRICH VAG BY NAA: NEGATIVE

## 2018-03-29 NOTE — Telephone Encounter (Signed)
Left message that nuswab all negative

## 2018-05-05 ENCOUNTER — Encounter (HOSPITAL_COMMUNITY): Payer: Self-pay | Admitting: Emergency Medicine

## 2018-05-05 ENCOUNTER — Emergency Department (HOSPITAL_COMMUNITY): Payer: 59

## 2018-05-05 ENCOUNTER — Emergency Department (HOSPITAL_COMMUNITY)
Admission: EM | Admit: 2018-05-05 | Discharge: 2018-05-05 | Disposition: A | Discharge status: Home / Self Care | Payer: 59 | Attending: Emergency Medicine | Admitting: Emergency Medicine

## 2018-05-05 ENCOUNTER — Other Ambulatory Visit: Payer: Self-pay

## 2018-05-05 DIAGNOSIS — J069 Acute upper respiratory infection, unspecified: Secondary | ICD-10-CM | POA: Insufficient documentation

## 2018-05-05 DIAGNOSIS — F17210 Nicotine dependence, cigarettes, uncomplicated: Secondary | ICD-10-CM | POA: Insufficient documentation

## 2018-05-05 DIAGNOSIS — J929 Pleural plaque without asbestos: Secondary | ICD-10-CM | POA: Diagnosis not present

## 2018-05-05 DIAGNOSIS — Z91040 Latex allergy status: Secondary | ICD-10-CM | POA: Diagnosis not present

## 2018-05-05 DIAGNOSIS — R05 Cough: Secondary | ICD-10-CM | POA: Diagnosis present

## 2018-05-05 DIAGNOSIS — J209 Acute bronchitis, unspecified: Secondary | ICD-10-CM | POA: Insufficient documentation

## 2018-05-05 LAB — GROUP A STREP BY PCR: Group A Strep by PCR: NOT DETECTED

## 2018-05-05 MED ORDER — IBUPROFEN 800 MG PO TABS
800.0000 mg | ORAL_TABLET | Freq: Once | ORAL | Status: AC
  Administered 2018-05-05: 800 mg via ORAL
  Filled 2018-05-05: qty 1

## 2018-05-05 MED ORDER — HYDROCOD POLST-CPM POLST ER 10-8 MG/5ML PO SUER
5.0000 mL | Freq: Once | ORAL | Status: AC
  Administered 2018-05-05: 5 mL via ORAL
  Filled 2018-05-05: qty 5

## 2018-05-05 MED ORDER — ALBUTEROL SULFATE HFA 108 (90 BASE) MCG/ACT IN AERS
2.0000 | INHALATION_SPRAY | Freq: Once | RESPIRATORY_TRACT | Status: AC
  Administered 2018-05-05: 2 via RESPIRATORY_TRACT
  Filled 2018-05-05: qty 6.7

## 2018-05-05 MED ORDER — DEXAMETHASONE 4 MG PO TABS: 4.0000 mg | ORAL_TABLET | Freq: Two times a day (BID) | ORAL | 0 refills | Status: DC

## 2018-05-05 MED ORDER — HYDROCODONE-HOMATROPINE 5-1.5 MG/5ML PO SYRP: 5.0000 mL | ORAL_SOLUTION | Freq: Four times a day (QID) | ORAL | 0 refills | Status: DC | PRN

## 2018-05-05 NOTE — Discharge Instructions (Addendum)
Your vital signs are within normal limits.  Your oxygen level is 100% on room air.  Your chest x-ray and your examination suggest bronchitis and acute upper respiratory infection.  Please increase your fluids.  Please wash hands frequently.  Usual mask until symptoms have resolved.  Use Decadron 2 times daily with food.  Use albuterol 2 puffs every 4 hours.  Use Claritin-D every 12 hours.  Use Hycodan for cough.This medication may cause drowsiness. Please do not drink, drive, or participate in activity that requires concentration while taking this medication.

## 2018-05-05 NOTE — ED Provider Notes (Signed)
[facility] EMERGENCY DEPARTMENT Provider Note   CSN: [id number] Arrival date & time: 05/05/18  2040     History   Chief Complaint Chief Complaint  Patient presents with  . Cough    HPI Kaylee Miller is a 27 y.o. female.  Patient is a 27 year old female who presents to the emergency department with a complaint of cough congestion and sore throat.  The patient states that this problem started about 3 days ago.  She has been having a productive cough with brownish phlegm.  The patient has had some chills and generalized body aches.  She presents now for assistance with this issue.  The history is provided by the patient.  Cough  This is a new problem. The current episode started more than 2 days ago. The problem occurs hourly. The problem has been gradually worsening. The cough is productive of brown sputum. Maximum temperature: subjective fever. Associated symptoms include headaches and myalgias. Pertinent negatives include no chest pain, no chills, no shortness of breath and no wheezing. She has tried nothing for the symptoms. She is a smoker. Her past medical history does not include bronchitis, emphysema or asthma.    Past Medical History:  Diagnosis Date  . Contraceptive management 02/08/2015  . Headache(784.0)    migraines  . History of bronchitis   . HSV-2 (herpes simplex virus 2) infection   . Pregnant   . Seasonal allergies     Patient Active Problem List   Diagnosis Date Noted  . Vaginal discharge 03/24/2018  . Screening examination for STD (sexually transmitted disease) 03/24/2018  . Encounter for surveillance of injectable contraceptive 06/01/2017  . Well woman exam with routine gynecological exam 06/01/2017  . Atypical mole 06/01/2017  . Irregular intermenstrual bleeding 06/01/2017  . Contraceptive management 02/08/2015  . 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 S/P C-section 07/28/2013  . Migraines 01/05/2013  . HSV-2 (herpes simplex virus 2)  infection 01/05/2013    Past Surgical History:  Procedure Laterality Date  . adenoids    . CESAREAN SECTION  2007  . CESAREAN SECTION N/A 07/28/2013   Procedure: CESAREAN SECTION;  Surgeon: Jonnie Kind, MD;  Location: [facility];  Service: Obstetrics;  Laterality: N/A;  . tubes in ears       OB History    Gravida  2   Para  2   Term  2   Preterm      AB      Living  2     SAB      TAB      Ectopic      Multiple      Live Births  2            Home Medications    Prior to Admission medications   Medication Sig Start Date End Date Taking? Authorizing Provider  acetaminophen (TYLENOL) 500 MG tablet Take 1,500 mg every 6 (six) hours as needed by mouth for moderate pain or headache.    [provider]  fluconazole (DIFLUCAN) 150 MG tablet Take 1 now and 1 in 3 days Patient not taking: Reported on 03/24/2018 03/09/18   Estill Dooms, NP  ketorolac (TORADOL) 10 MG tablet Take 1 tablet (10 mg total) by mouth every 6 (six) hours as needed. Patient not taking: Reported on 03/24/2018 12/10/17   Estill Dooms, NP  MedroxyPROGESTERone Acetate 150 MG/ML SUSY Inject 1 mL (150 mg total) into the muscle every 3 (three) months. 06/01/17  Derrek Monaco A, NP  megestrol (MEGACE) 40 MG tablet Take 2 daily if having bleeding Patient not taking: Reported on 03/24/2018 06/01/17   Estill Dooms, NP    Family History Family History  Problem Relation Age of Onset  . Cancer Mother   . Hypertension Father   . Stroke Father   . CAD Maternal Grandfather   . CAD Paternal Grandmother   . CAD Paternal Grandfather   . Diabetes Other        great-great grandmother  . CAD Other     Social History Social History   Tobacco Use  . Smoking status: Current Some Day Smoker    Packs/day: 1.00    Years: 9.00    Pack years: 9.00    Types: Cigarettes  . Smokeless tobacco: Never Used  Substance Use Topics  . Alcohol use: Yes    Alcohol/week: 0.0 standard  drinks    Comment: occasional  . Drug use: No     Allergies   Latex   Review of Systems Review of Systems  Constitutional: Negative for activity change and chills.       All ROS Neg except as noted in HPI  HENT: Negative for nosebleeds.   Eyes: Negative for photophobia and discharge.  Respiratory: Positive for cough. Negative for shortness of breath and wheezing.   Cardiovascular: Negative for chest pain and palpitations.  Gastrointestinal: Negative for abdominal pain and blood in stool.  Genitourinary: Negative for dysuria, frequency and hematuria.  Musculoskeletal: Positive for myalgias. Negative for arthralgias, back pain and neck pain.  Skin: Negative.   Neurological: Positive for headaches. Negative for dizziness, seizures and speech difficulty.  Psychiatric/Behavioral: Negative for confusion and hallucinations.     Physical Exam Updated Vital Signs BP 121/61 (BP Location: Right Arm)   Pulse 91   Temp 98.9 F (37.2 C) (Oral)   Resp 12   SpO2 99%   Physical Exam  Constitutional: She is oriented to person, place, and time. She appears well-developed and well-nourished.  Non-toxic appearance.  HENT:  Head: Normocephalic.  Right Ear: Tympanic membrane and external ear normal.  Left Ear: Tympanic membrane and external ear normal.  Nasal congestion present. Mild increase redness of the posterior pharynx.  Eyes: Pupils are equal, round, and reactive to light. EOM and lids are normal.  Neck: Normal range of motion. Neck supple. Carotid bruit is not present.  Cardiovascular: Normal rate, regular rhythm, normal heart sounds, intact distal pulses and normal pulses. Exam reveals no gallop and no friction rub.  No murmur heard. Pulmonary/Chest: No stridor. No respiratory distress. She has wheezes.  Abdominal: Soft. Bowel sounds are normal. There is no tenderness. There is no guarding.  Musculoskeletal: Normal range of motion.  Lymphadenopathy:       Head (right side): No  submandibular adenopathy present.       Head (left side): No submandibular adenopathy present.    She has no cervical adenopathy.  Neurological: She is alert and oriented to person, place, and time. She has normal strength. No cranial nerve deficit or sensory deficit.  Skin: Skin is warm and dry. No rash noted.  Psychiatric: She has a normal mood and affect. Her speech is normal.  Nursing note and vitals reviewed.    ED Treatments / Results  Labs (all labs ordered are listed, but only abnormal results are displayed) Labs Reviewed  GROUP A STREP BY PCR    EKG None  Radiology No results found.  Procedures Procedures (including critical  care time)  Medications Ordered in ED Medications - No data to display   Initial Impression / Assessment and Plan / ED Course  I have reviewed the triage vital signs and the nursing notes.  Pertinent labs & imaging results that were available during my care of the patient were reviewed by me and considered in my medical decision making (see chart for details).       Final Clinical Impressions(s) / ED Diagnoses MDM  Vital signs reviewed.  Patient states she has had chills, but has not actually measured her temperature.  There is been no unusual rash.  No tick bites.  Patient has not been out of the country recently. Strep test is negative.  Chest x-ray shows bronchial thickening consistent with bronchitis.  No pneumonia noted.  The patient was treated with albuterol and ibuprofen for body aches. Prescription for Hycodan and Decadron given to the patient.  The patient is also given an albuterol inhaler to use.  She is to follow-up with her primary physician or return to the emergency department if any changes in condition, problems, or concerns.  Patient is in agreement with this plan.   Final diagnoses:  Acute bronchitis, unspecified organism  Acute upper respiratory infection    ED Discharge Orders         Ordered    dexamethasone  (DECADRON) 4 MG tablet  2 times daily with meals     05/05/18 2231    HYDROcodone-homatropine (HYCODAN) 5-1.5 MG/5ML syrup  Every 6 hours PRN     05/05/18 2231           Lily Kocher, PA-C 05/06/18 0023    Nat Christen, MD 05/06/18 2234

## 2018-05-05 NOTE — ED Triage Notes (Signed)
Pt C/O cough and sore throat that started 3 days ago. Pt states she has been having chills at home.

## 2018-06-02 ENCOUNTER — Ambulatory Visit (INDEPENDENT_AMBULATORY_CARE_PROVIDER_SITE_OTHER): Payer: 59 | Admitting: *Deleted

## 2018-06-02 DIAGNOSIS — Z3042 Encounter for surveillance of injectable contraceptive: Secondary | ICD-10-CM | POA: Diagnosis not present

## 2018-06-02 DIAGNOSIS — Z3202 Encounter for pregnancy test, result negative: Secondary | ICD-10-CM | POA: Diagnosis not present

## 2018-06-02 LAB — POCT URINE PREGNANCY: Preg Test, Ur: NEGATIVE

## 2018-06-02 MED ORDER — MEDROXYPROGESTERONE ACETATE 150 MG/ML IM SUSP
150.0000 mg | Freq: Once | INTRAMUSCULAR | Status: AC
  Administered 2018-06-02: 150 mg via INTRAMUSCULAR

## 2018-06-02 NOTE — Progress Notes (Signed)
Depo Provera 150 mg given IM in left deltoid. Patient tolerated well. Next dose in 12 weeks.  

## 2018-07-14 ENCOUNTER — Other Ambulatory Visit: Payer: 59 | Admitting: Adult Health

## 2018-08-25 ENCOUNTER — Other Ambulatory Visit: Payer: Self-pay | Admitting: Adult Health

## 2018-08-26 ENCOUNTER — Ambulatory Visit: Payer: 59

## 2018-08-26 ENCOUNTER — Encounter: Payer: Self-pay | Admitting: Adult Health

## 2018-08-26 ENCOUNTER — Telehealth: Payer: Self-pay | Admitting: *Deleted

## 2018-08-26 ENCOUNTER — Encounter (INDEPENDENT_AMBULATORY_CARE_PROVIDER_SITE_OTHER): Payer: Self-pay

## 2018-08-26 ENCOUNTER — Ambulatory Visit (INDEPENDENT_AMBULATORY_CARE_PROVIDER_SITE_OTHER): Payer: 59 | Admitting: Adult Health

## 2018-08-26 VITALS — BP 109/73 | HR 76 | Ht 67.0 in | Wt 195.5 lb

## 2018-08-26 DIAGNOSIS — Z308 Encounter for other contraceptive management: Secondary | ICD-10-CM

## 2018-08-26 DIAGNOSIS — Z3042 Encounter for surveillance of injectable contraceptive: Secondary | ICD-10-CM

## 2018-08-26 DIAGNOSIS — Z3009 Encounter for other general counseling and advice on contraception: Secondary | ICD-10-CM

## 2018-08-26 DIAGNOSIS — Z01419 Encounter for gynecological examination (general) (routine) without abnormal findings: Secondary | ICD-10-CM

## 2018-08-26 DIAGNOSIS — Z113 Encounter for screening for infections with a predominantly sexual mode of transmission: Secondary | ICD-10-CM

## 2018-08-26 DIAGNOSIS — F32A Depression, unspecified: Secondary | ICD-10-CM

## 2018-08-26 DIAGNOSIS — Z3202 Encounter for pregnancy test, result negative: Secondary | ICD-10-CM

## 2018-08-26 DIAGNOSIS — F329 Major depressive disorder, single episode, unspecified: Secondary | ICD-10-CM

## 2018-08-26 LAB — POCT URINE PREGNANCY: Preg Test, Ur: NEGATIVE

## 2018-08-26 MED ORDER — ESCITALOPRAM OXALATE 10 MG PO TABS: 10.0000 mg | ORAL_TABLET | Freq: Every day | ORAL | 6 refills | Status: DC

## 2018-08-26 MED ORDER — MEDROXYPROGESTERONE ACETATE 150 MG/ML IM SUSY: 150.0000 mg | PREFILLED_SYRINGE | INTRAMUSCULAR | 4 refills | Status: DC

## 2018-08-26 MED ORDER — MEDROXYPROGESTERONE ACETATE 150 MG/ML IM SUSP
150.0000 mg | Freq: Once | INTRAMUSCULAR | Status: AC
  Administered 2018-08-26: 150 mg via INTRAMUSCULAR

## 2018-08-26 NOTE — Progress Notes (Signed)
Patient ID: Kaylee Miller, female   DOB: 05-11-1991, 28 y.o.   MRN: [id number] History of Present Illness: Kaylee Miller is a 28 year old black female, G2P2 in for well woman gyn exam, she had normal pap 06/01/17. No PCP.    Current Medications, Allergies, Past Medical History, Past Surgical History, Family History and Social History were reviewed in [employer] record.     Review of Systems:  Patient denies any headaches, hearing loss, fatigue, blurred vision, shortness of breath, chest pain, abdominal pain, problems with bowel movements, urination, or intercourse. No joint pain. +moody and feels depressed at times, has been working 2 jobs,stressed, over having help with kids. Not sleeping well.  Happy with depo.   Physical Exam:BP 109/73 (BP Location: Right Arm, Patient Position: Sitting, Cuff Size: Large)   Pulse 76   Ht 5\' 7"  (1.702 m)   Wt 195 lb 8 oz (88.7 kg)   LMP 08/14/2018   BMI 30.62 kg/m UPT negative. General:  Well developed, well nourished, no acute distress Skin:  Warm and dry Neck:  Midline trachea, normal thyroid, good ROM, no lymphadenopathy Lungs; Clear to auscultation bilaterally Breast:  No dominant palpable mass, retraction, or nipple discharge Cardiovascular: Regular rate and rhythm Abdomen:  Soft, non tender, no hepatosplenomegaly Pelvic:  External genitalia is normal in appearance, no lesions.  The vagina is normal in appearance. Urethra has no lesions or masses. The cervix is bulbous.  Uterus is felt to be normal size, shape, and contour.  No adnexal masses or tenderness noted.Bladder is non tender, no masses felt. GC/CHL obtained. Extremities/musculoskeletal:  No swelling or varicosities noted, no clubbing or cyanosis Psych:  No mood changes, alert and cooperative,seems happy PHQ 9 score 18, denies being suicidal and is open to meds, will Rx Lexapro. Fall risk is low. Examination chaperoned by Levy Pupa LPN. Pt got depo today by  Levy Pupa, LPN.  Impression: 1. Encounter for well woman exam with routine gynecological exam   2. Encounter for other contraceptive management   3. Pregnancy examination or test, negative result   4. Family planning   5. Screening examination for STD (sexually transmitted disease)   6. Depression, unspecified depression type       Plan: Check HIV and RPR GC/CHL sent Meds ordered this encounter  Medications  . medroxyPROGESTERone (DEPO-PROVERA) injection 150 mg  . medroxyPROGESTERone Acetate 150 MG/ML SUSY    Sig: Inject 1 mL (150 mg total) into the muscle every 3 (three) months.    Dispense:  1 mL    Refill:  4    Order Specific Question:   Supervising Provider    Answer:   Elonda Husky, LUTHER H [[id number]]  . escitalopram (LEXAPRO) 10 MG tablet    Sig: Take 1 tablet (10 mg total) by mouth daily.    Dispense:  30 tablet    Refill:  6    Order Specific Question:   Supervising Provider    Answer:   Tania Ade H [[id number]]  F/U in 6 weeks  Physical and pap in 1 year  Consider EAP

## 2018-08-26 NOTE — Patient Instructions (Signed)
Major Depressive Disorder, Adult Major depressive disorder (MDD) is a mental health condition. MDD often makes you feel sad, hopeless, or helpless. MDD can also cause symptoms in your body. MDD can affect your:  Work.  School.  Relationships.  Other normal activities. MDD can range from mild to very bad. It may occur once (single episode MDD). It can also occur many times (recurrent MDD). The main symptoms of MDD often include:  Feeling sad, depressed, or irritable most of the time.  Loss of interest. MDD symptoms also include:  Sleeping too much or too little.  Eating too much or too little.  A change in your weight.  Feeling tired (fatigue) or having low energy.  Feeling worthless.  Feeling guilty.  Trouble making decisions.  Trouble thinking clearly.  Thoughts of suicide or harming others.  Feeling weak.  Feeling agitated.  Keeping yourself from being around other people (isolation). Follow these instructions at home: Activity  Do these things as told by your doctor: ? Go back to your normal activities. ? Exercise regularly. ? Spend time outdoors. Alcohol  Talk with your doctor about how alcohol can affect your antidepressant medicines.  Do not drink alcohol. Or, limit how much alcohol you drink. ? This means no more than 1 drink a day for nonpregnant women and 2 drinks a day for men. One drink equals one of these:  12 oz of beer.  5 oz of wine.  1 oz of hard liquor. General instructions  Take over-the-counter and prescription medicines only as told by your doctor.  Eat a healthy diet.  Get plenty of sleep.  Find activities that you enjoy. Make time to do them.  Think about joining a support group. Your doctor may be able to suggest a group for you.  Keep all follow-up visits as told by your doctor. This is important. Where to find more information:  [employer] on Mental Illness: ? www.nami.[place]: ? https://carter.com/  National Suicide Prevention Lifeline: ? 657-545-7869. This is free, 24-hour help. Contact a doctor if:  Your symptoms get worse.  You have new symptoms. Get help right away if:  You self-harm.  You see, hear, taste, smell, or feel things that are not present (hallucinate). If you ever feel like you may hurt yourself or others, or have thoughts about taking your own life, get help right away. You can go to your nearest emergency department or call:  Your local emergency services (911 in the [place]  A suicide crisis helpline, such as the National Suicide Prevention Lifeline: ? 925-252-4129. This is open 24 hours a day. This information is not intended to replace advice given to you by your health care provider. Make sure you discuss any questions you have with your health care provider. Document Released: 07/23/2015 Document Revised: 04/27/2016 Document Reviewed: 04/27/2016 Elsevier Interactive Patient Education  2019 [employer]. Consider EAP

## 2018-08-26 NOTE — Progress Notes (Signed)
Pt received Depo in right deltoid. Pt tolerated shot well. Return in 12 weeks for next shot. JSY 

## 2018-08-27 LAB — RPR: RPR Ser Ql: NONREACTIVE

## 2018-08-27 LAB — HIV ANTIBODY (ROUTINE TESTING W REFLEX): HIV Screen 4th Generation wRfx: NONREACTIVE

## 2018-08-28 LAB — GC/CHLAMYDIA PROBE AMP
Chlamydia trachomatis, NAA: NEGATIVE
Neisseria gonorrhoeae by PCR: NEGATIVE

## 2018-10-07 ENCOUNTER — Ambulatory Visit: Payer: 59 | Admitting: Adult Health

## 2018-10-25 NOTE — Telephone Encounter (Signed)
Provider notified

## 2018-11-18 ENCOUNTER — Ambulatory Visit (INDEPENDENT_AMBULATORY_CARE_PROVIDER_SITE_OTHER): Payer: 59 | Admitting: *Deleted

## 2018-11-18 ENCOUNTER — Other Ambulatory Visit: Payer: Self-pay

## 2018-11-18 DIAGNOSIS — Z3042 Encounter for surveillance of injectable contraceptive: Secondary | ICD-10-CM

## 2018-11-18 MED ORDER — MEDROXYPROGESTERONE ACETATE 150 MG/ML IM SUSP
150.0000 mg | Freq: Once | INTRAMUSCULAR | Status: AC
  Administered 2018-11-18: 150 mg via INTRAMUSCULAR

## 2018-11-18 NOTE — Progress Notes (Signed)
Pt given DepoProvera 150mg  IM left deltoid. Advised to return in 12 weeks for next injection.

## 2018-12-13 ENCOUNTER — Encounter (HOSPITAL_COMMUNITY): Payer: Self-pay | Admitting: Emergency Medicine

## 2018-12-13 ENCOUNTER — Emergency Department (HOSPITAL_COMMUNITY)
Admission: EM | Admit: 2018-12-13 | Discharge: 2018-12-13 | Disposition: A | Discharge status: Home / Self Care | Payer: 59 | Attending: Emergency Medicine | Admitting: Emergency Medicine

## 2018-12-13 ENCOUNTER — Other Ambulatory Visit: Payer: Self-pay

## 2018-12-13 DIAGNOSIS — Z91040 Latex allergy status: Secondary | ICD-10-CM | POA: Insufficient documentation

## 2018-12-13 DIAGNOSIS — H6692 Otitis media, unspecified, left ear: Secondary | ICD-10-CM | POA: Insufficient documentation

## 2018-12-13 DIAGNOSIS — Z79899 Other long term (current) drug therapy: Secondary | ICD-10-CM | POA: Insufficient documentation

## 2018-12-13 DIAGNOSIS — H6092 Unspecified otitis externa, left ear: Secondary | ICD-10-CM | POA: Insufficient documentation

## 2018-12-13 DIAGNOSIS — H6090 Unspecified otitis externa, unspecified ear: Secondary | ICD-10-CM

## 2018-12-13 DIAGNOSIS — H9202 Otalgia, left ear: Secondary | ICD-10-CM | POA: Diagnosis present

## 2018-12-13 DIAGNOSIS — F17210 Nicotine dependence, cigarettes, uncomplicated: Secondary | ICD-10-CM | POA: Insufficient documentation

## 2018-12-13 DIAGNOSIS — H6690 Otitis media, unspecified, unspecified ear: Secondary | ICD-10-CM

## 2018-12-13 MED ORDER — NEOMYCIN-POLYMYXIN-HC 3.5-10000-1 OT SOLN: 3.0000 [drp] | Freq: Three times a day (TID) | OTIC | 0 refills | Status: AC

## 2018-12-13 MED ORDER — IBUPROFEN 800 MG PO TABS: 800.0000 mg | ORAL_TABLET | Freq: Three times a day (TID) | ORAL | 0 refills | Status: DC

## 2018-12-13 MED ORDER — AMOXICILLIN 500 MG PO CAPS: 500.0000 mg | ORAL_CAPSULE | Freq: Three times a day (TID) | ORAL | 0 refills | Status: DC

## 2018-12-13 NOTE — ED Triage Notes (Signed)
PT states left ear pain with white drainage noted unrelieved by OTC wax removal drops x2 days with no fever and some sore throat.

## 2018-12-13 NOTE — ED Provider Notes (Signed)
[facility] EMERGENCY DEPARTMENT Provider Note   CSN: [id number] Arrival date & time: 12/13/18  1505    History   Chief Complaint Chief Complaint  Patient presents with  . Otalgia    HPI Kaylee Miller is a 28 y.o. female.     The history is provided by the patient. No language interpreter was used.  Otalgia  Location:  Left Behind ear:  No abnormality Quality:  Aching Severity:  Moderate Onset quality:  Gradual Duration:  2 days Timing:  Constant Progression:  Worsening Chronicity:  New Relieved by:  Nothing Worsened by:  Cold air Ineffective treatments:  OTC medications Associated symptoms: ear discharge   Pt reports she used wax removal drops, now ear is draining  Past Medical History:  Diagnosis Date  . Contraceptive management 02/08/2015  . Headache(784.0)    migraines  . History of bronchitis   . HSV-2 (herpes simplex virus 2) infection   . Pregnant   . Seasonal allergies     Patient Active Problem List   Diagnosis Date Noted  . Depression 08/26/2018  . Family planning 08/26/2018  . Pregnancy examination or test, negative result 08/26/2018  . Encounter for well woman exam with routine gynecological exam 08/26/2018  . Vaginal discharge 03/24/2018  . Screening examination for STD (sexually transmitted disease) 03/24/2018  . Encounter for surveillance of injectable contraceptive 06/01/2017  . Well woman exam with routine gynecological exam 06/01/2017  . Atypical mole 06/01/2017  . Irregular intermenstrual bleeding 06/01/2017  . Contraceptive management 02/08/2015  . 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 S/P C-section 07/28/2013  . Migraines 01/05/2013  . HSV-2 (herpes simplex virus 2) infection 01/05/2013    Past Surgical History:  Procedure Laterality Date  . adenoids    . CESAREAN SECTION  2007  . CESAREAN SECTION N/A 07/28/2013   Procedure: CESAREAN SECTION;  Surgeon: Jonnie Kind, MD;  Location: [facility];  Service: Obstetrics;   Laterality: N/A;  . tubes in ears       OB History    Gravida  2   Para  2   Term  2   Preterm      AB      Living  2     SAB      TAB      Ectopic      Multiple      Live Births  2            Home Medications    Prior to Admission medications   Medication Sig Start Date End Date Taking? Authorizing Provider  acetaminophen (TYLENOL) 500 MG tablet Take 1,500 mg every 6 (six) hours as needed by mouth for moderate pain or headache.    [provider]  escitalopram (LEXAPRO) 10 MG tablet Take 1 tablet (10 mg total) by mouth daily. 08/26/18   Estill Dooms, NP  medroxyPROGESTERone Acetate 150 MG/ML SUSY Inject 1 mL (150 mg total) into the muscle every 3 (three) months. 08/26/18   Estill Dooms, NP  megestrol (MEGACE) 40 MG tablet Take 2 daily if having bleeding 06/01/17   Estill Dooms, NP    Family History Family History  Problem Relation Age of Onset  . Cancer Mother   . Hypertension Father   . Stroke Father   . CAD Maternal Grandfather   . CAD Paternal Grandmother   . CAD Paternal Grandfather   . Diabetes Other        great-great  grandmother  . CAD Other     Social History Social History   Tobacco Use  . Smoking status: Current Some Day Smoker    Packs/day: 1.00    Years: 9.00    Pack years: 9.00    Types: Cigarettes  . Smokeless tobacco: Never Used  Substance Use Topics  . Alcohol use: Yes    Alcohol/week: 0.0 standard drinks    Comment: occasional  . Drug use: No     Allergies   Latex   Review of Systems Review of Systems  HENT: Positive for ear discharge and ear pain.   All other systems reviewed and are negative.    Physical Exam Updated Vital Signs BP 121/76 (BP Location: Right Arm)   Pulse 88   Temp 99.1 F (37.3 C) (Oral)   Resp 18   Ht 5\' 7"  (1.702 m)   Wt 88.5 kg   SpO2 100%   BMI 30.54 kg/m   Physical Exam Vitals signs and nursing note reviewed.  Constitutional:      Appearance: She  is well-developed.  HENT:     Head: Normocephalic.     Right Ear: Tympanic membrane normal.     Ears:     Comments: Swollen ear canal,     Nose: Nose normal.     Mouth/Throat:     Mouth: Mucous membranes are moist.  Eyes:     Pupils: Pupils are equal, round, and reactive to light.  Neck:     Musculoskeletal: Normal range of motion.  Cardiovascular:     Rate and Rhythm: Normal rate.  Pulmonary:     Effort: Pulmonary effort is normal.  Abdominal:     General: There is no distension.  Musculoskeletal: Normal range of motion.  Neurological:     Mental Status: She is alert and oriented to person, place, and time.  Psychiatric:        Mood and Affect: Mood normal.      ED Treatments / Results  Labs (all labs ordered are listed, but only abnormal results are displayed) Labs Reviewed - No data to display  EKG None  Radiology No results found.  Procedures Procedures (including critical care time)  Medications Ordered in ED Medications - No data to display   Initial Impression / Assessment and Plan / ED Course  I have reviewed the triage vital signs and the nursing notes.  Pertinent labs & imaging results that were available during my care of the patient were reviewed by me and considered in my medical decision making (see chart for details).        MDM  Pt advised to use drops and antibiotic.  Return if symptoms persist for recheck or see primary care  Final Clinical Impressions(s) / ED Diagnoses   Final diagnoses:  Acute otitis media, unspecified otitis media type  Otitis externa, unspecified chronicity, unspecified laterality, unspecified type    ED Discharge Orders         Ordered    ibuprofen (ADVIL) 800 MG tablet  3 times daily     12/13/18 1539    amoxicillin (AMOXIL) 500 MG capsule  3 times daily     12/13/18 1539    neomycin-polymyxin-hydrocortisone (CORTISPORIN) OTIC solution  3 times daily     12/13/18 1539        An After Visit Summary was  printed and given to the patient.    Fransico Meadow, [place] 12/13/18 1541    Margette Fast, MD 12/13/18  1622  

## 2018-12-13 NOTE — Discharge Instructions (Signed)
Return if any problems.

## 2019-02-09 ENCOUNTER — Telehealth: Payer: Self-pay | Admitting: Obstetrics & Gynecology

## 2019-02-09 NOTE — Telephone Encounter (Signed)

## 2019-02-10 ENCOUNTER — Ambulatory Visit (INDEPENDENT_AMBULATORY_CARE_PROVIDER_SITE_OTHER): Payer: Medicaid Other

## 2019-02-10 ENCOUNTER — Other Ambulatory Visit: Payer: Self-pay

## 2019-02-10 ENCOUNTER — Encounter: Payer: Self-pay | Admitting: Obstetrics & Gynecology

## 2019-02-10 DIAGNOSIS — Z3042 Encounter for surveillance of injectable contraceptive: Secondary | ICD-10-CM | POA: Diagnosis not present

## 2019-02-10 MED ORDER — MEDROXYPROGESTERONE ACETATE 150 MG/ML IM SUSP
150.0000 mg | Freq: Once | INTRAMUSCULAR | Status: AC
  Administered 2019-02-10: 150 mg via INTRAMUSCULAR

## 2019-02-10 NOTE — Progress Notes (Signed)
Pt here for depo injection 150 mg IM given Rt deltoid. Tolerated well.return 12 weeks for injection . Pad CMA

## 2019-05-03 ENCOUNTER — Telehealth: Payer: Self-pay | Admitting: Obstetrics & Gynecology

## 2019-05-03 NOTE — Telephone Encounter (Signed)
Called patient regarding appointment scheduled in our office encouraged to come alone to the visit if possible, however, a support person, over age 30, may accompany her  to appointment if assistance is needed for safety or care concerns. Otherwise, support persons should remain outside until the visit is complete.  ° °We ask if you have had any exposure to anyone suspected or confirmed of having COVID-19 or if you are experiencing any of the following, to call and reschedule your appointment: fever, cough, shortness of breath, muscle pain, diarrhea, rash, vomiting, abdominal pain, red eye, weakness, bruising, bleeding, joint pain, or a severe headache.  ° °Please know we will ask you these questions or similar questions when you arrive for your appointment and again it’s how we are keeping everyone safe.   ° °Also,to keep you safe, please use the provided hand sanitizer when you enter the office. We are asking everyone in the office to wear a mask to help prevent the spread of °germs. If you have a mask of your own, please wear it to your appointment, if not, we are happy to provide one for you. ° °Thank you for understanding and your cooperation.  ° ° °[facility] Staff ° ° ° °

## 2019-05-04 ENCOUNTER — Ambulatory Visit (INDEPENDENT_AMBULATORY_CARE_PROVIDER_SITE_OTHER): Payer: Medicaid Other

## 2019-05-04 ENCOUNTER — Other Ambulatory Visit: Payer: Self-pay

## 2019-05-04 VITALS — Ht 67.0 in | Wt 183.0 lb

## 2019-05-04 DIAGNOSIS — Z3202 Encounter for pregnancy test, result negative: Secondary | ICD-10-CM | POA: Diagnosis not present

## 2019-05-04 DIAGNOSIS — Z3042 Encounter for surveillance of injectable contraceptive: Secondary | ICD-10-CM

## 2019-05-04 LAB — POCT URINE PREGNANCY: Preg Test, Ur: NEGATIVE

## 2019-05-04 MED ORDER — MEDROXYPROGESTERONE ACETATE 150 MG/ML IM SUSP
150.0000 mg | Freq: Once | INTRAMUSCULAR | Status: AC
  Administered 2019-05-04: 150 mg via INTRAMUSCULAR

## 2019-05-04 NOTE — Progress Notes (Signed)
Pt here for depo injection 150 mg IM LT deltoid. Tolerated well. Return 12 weeks for next injection. Pad CMA

## 2019-06-21 ENCOUNTER — Other Ambulatory Visit: Payer: Self-pay | Admitting: *Deleted

## 2019-06-21 DIAGNOSIS — Z20822 Contact with and (suspected) exposure to covid-19: Secondary | ICD-10-CM

## 2019-06-21 DIAGNOSIS — Z20828 Contact with and (suspected) exposure to other viral communicable diseases: Secondary | ICD-10-CM | POA: Diagnosis not present

## 2019-06-22 LAB — NOVEL CORONAVIRUS, NAA: SARS-CoV-2, NAA: NOT DETECTED

## 2019-07-26 ENCOUNTER — Encounter: Payer: Self-pay | Admitting: *Deleted

## 2019-07-27 ENCOUNTER — Other Ambulatory Visit: Payer: Self-pay | Admitting: Adult Health

## 2019-07-27 ENCOUNTER — Ambulatory Visit: Payer: Medicaid Other

## 2019-07-27 MED ORDER — MEGESTROL ACETATE 40 MG PO TABS: ORAL_TABLET | ORAL | 1 refills | Status: DC

## 2019-07-27 NOTE — Progress Notes (Signed)
Refilled megace  

## 2019-07-29 ENCOUNTER — Ambulatory Visit (INDEPENDENT_AMBULATORY_CARE_PROVIDER_SITE_OTHER): Payer: Medicaid Other | Admitting: *Deleted

## 2019-07-29 ENCOUNTER — Other Ambulatory Visit: Payer: Self-pay

## 2019-07-29 DIAGNOSIS — Z3042 Encounter for surveillance of injectable contraceptive: Secondary | ICD-10-CM | POA: Diagnosis not present

## 2019-07-29 MED ORDER — MEDROXYPROGESTERONE ACETATE 150 MG/ML IM SUSP
150.0000 mg | Freq: Once | INTRAMUSCULAR | Status: AC
  Administered 2019-07-29: 150 mg via INTRAMUSCULAR

## 2019-07-29 NOTE — Progress Notes (Signed)
   NURSE VISIT- INJECTION  SUBJECTIVE:  Kaylee Miller is a 28 y.o. [id number] female here for a Depo Provera for contraception/period management. She is a GYN patient.   OBJECTIVE:  There were no vitals taken for this visit.  Appears well, in no apparent distress  Injection administered in: Right deltoid  No orders of the defined types were placed in this encounter.   ASSESSMENT: GYN patient Depo Provera for contraception/period management PLAN: Follow-up: in 11-13 weeks for next Depo   Rolena Infante  07/29/2019 11:41 AM

## 2019-08-02 ENCOUNTER — Telehealth: Payer: Self-pay | Admitting: *Deleted

## 2019-08-02 NOTE — Telephone Encounter (Signed)
Mail box full @ 5:00 pm. JSY

## 2019-08-02 NOTE — Telephone Encounter (Signed)
Patient called and said was in on Friday for Depo. States is still bleeding, having bad cramps, and tired of hurting. Patient was prescribed megace but not helping. Would like to know what to do.

## 2019-08-03 NOTE — Telephone Encounter (Signed)
Spoke with pt. Pt is on Depo and is having irregular bleeding. Has been bleeding x 3 weeks. Megace is not stopping the bleeding. Also having bad cramps. Advised she needs to be seen. Appt given Friday @ 10:30 am. Pt voiced understanding. Vilas

## 2019-08-05 ENCOUNTER — Encounter: Payer: Self-pay | Admitting: Adult Health

## 2019-08-05 ENCOUNTER — Other Ambulatory Visit: Payer: Self-pay

## 2019-08-05 ENCOUNTER — Ambulatory Visit (INDEPENDENT_AMBULATORY_CARE_PROVIDER_SITE_OTHER): Payer: Medicaid Other | Admitting: Adult Health

## 2019-08-05 VITALS — BP 116/79 | HR 79 | Ht 67.0 in | Wt 180.0 lb

## 2019-08-05 DIAGNOSIS — N921 Excessive and frequent menstruation with irregular cycle: Secondary | ICD-10-CM | POA: Diagnosis not present

## 2019-08-05 DIAGNOSIS — Z3202 Encounter for pregnancy test, result negative: Secondary | ICD-10-CM | POA: Diagnosis not present

## 2019-08-05 LAB — POCT URINE PREGNANCY: Preg Test, Ur: NEGATIVE

## 2019-08-05 NOTE — Progress Notes (Signed)
  Subjective:     Patient ID: Kaylee Miller, female   DOB: 08-03-1991, 28 y.o.   MRN: [id number]  HPI Kaylee Miller is a 28 year old black female, G2P2 in for vaginal bleeding on depo but it stopped 2 days ago. Has some cramping.   Review of Systems Had bleeding with depo, was given megace 12/2 it did not really help then got shot and bleeding stopped 2 days ago Some cramping Reviewed past medical,surgical, social and family history. Reviewed medications and allergies.     Objective:   Physical Exam BP 116/79 (BP Location: Left Arm, Patient Position: Sitting, Cuff Size: Normal)   Pulse 79   Ht 5\' 7"  (1.702 m)   Wt 180 lb (81.6 kg)   BMI 28.19 kg/m UPT is negative. Skin warm and dry. Lungs: clear to ausculation bilaterally. Cardiovascular: regular rate and rhythm. Pelvic: external genitalia is normal in appearance no lesions, vagina: scant brown discharge without odor,urethra has no lesions or masses noted, cervix:smooth and bulbous, No CMT,uterus: normal size, shape and contour, non tender, no masses felt, adnexa: no masses or tenderness noted. Bladder is non tender and no masses felt. Co exam with Weyman Croon FNP student.  She declines STD testing.     Assessment:     1. Irregular intermenstrual bleeding   2. Pregnancy examination or test, negative result       Plan:     Use advil for aleve for cramping Follow up prn

## 2019-10-21 ENCOUNTER — Other Ambulatory Visit: Payer: Self-pay | Admitting: Adult Health

## 2019-10-21 ENCOUNTER — Ambulatory Visit (INDEPENDENT_AMBULATORY_CARE_PROVIDER_SITE_OTHER): Payer: Medicaid Other

## 2019-10-21 ENCOUNTER — Other Ambulatory Visit: Payer: Self-pay

## 2019-10-21 ENCOUNTER — Other Ambulatory Visit: Payer: Self-pay | Admitting: *Deleted

## 2019-10-21 VITALS — Ht 67.0 in | Wt 187.0 lb

## 2019-10-21 DIAGNOSIS — Z3042 Encounter for surveillance of injectable contraceptive: Secondary | ICD-10-CM

## 2019-10-21 MED ORDER — MEDROXYPROGESTERONE ACETATE 150 MG/ML IM SUSP
150.0000 mg | Freq: Once | INTRAMUSCULAR | Status: AC
  Administered 2019-10-21: 150 mg via INTRAMUSCULAR

## 2019-10-21 NOTE — Progress Notes (Signed)
   NURSE VISIT- INJECTION  SUBJECTIVE:  Kaylee Miller is a 29 y.o. G17P2002 female here for a Depo Provera for contraception/period management. She is a GYN patient.   OBJECTIVE:  There were no vitals taken for this visit.  Appears well, in no apparent distress  Injection administered in: Left deltoid  No orders of the defined types were placed in this encounter.   ASSESSMENT: GYN patient Depo Provera for contraception/period management PLAN: Follow-up: in 11-13 weeks for next Depo   Ladonna Snide  10/21/2019 11:27 AM

## 2019-10-31 DIAGNOSIS — J209 Acute bronchitis, unspecified: Secondary | ICD-10-CM | POA: Diagnosis not present

## 2019-10-31 DIAGNOSIS — J45909 Unspecified asthma, uncomplicated: Secondary | ICD-10-CM | POA: Diagnosis not present

## 2019-10-31 DIAGNOSIS — J069 Acute upper respiratory infection, unspecified: Secondary | ICD-10-CM | POA: Diagnosis not present

## 2020-01-09 ENCOUNTER — Emergency Department (HOSPITAL_COMMUNITY)
Admission: EM | Admit: 2020-01-09 | Discharge: 2020-01-09 | Disposition: A | Discharge status: Home / Self Care | Payer: Medicaid Other | Attending: Emergency Medicine | Admitting: Emergency Medicine

## 2020-01-09 ENCOUNTER — Other Ambulatory Visit: Payer: Self-pay

## 2020-01-09 ENCOUNTER — Encounter (HOSPITAL_COMMUNITY): Payer: Self-pay | Admitting: Emergency Medicine

## 2020-01-09 DIAGNOSIS — R102 Pelvic and perineal pain: Secondary | ICD-10-CM | POA: Diagnosis present

## 2020-01-09 DIAGNOSIS — N3001 Acute cystitis with hematuria: Secondary | ICD-10-CM | POA: Diagnosis not present

## 2020-01-09 DIAGNOSIS — F17210 Nicotine dependence, cigarettes, uncomplicated: Secondary | ICD-10-CM | POA: Insufficient documentation

## 2020-01-09 LAB — CBC WITH DIFFERENTIAL/PLATELET
Abs Immature Granulocytes: 0.02 10*3/uL (ref 0.00–0.07)
Basophils Absolute: 0 10*3/uL (ref 0.0–0.1)
Basophils Relative: 1 %
Eosinophils Absolute: 0.7 10*3/uL — ABNORMAL HIGH (ref 0.0–0.5)
Eosinophils Relative: 9 %
HCT: 39.4 % (ref 36.0–46.0)
Hemoglobin: 12.5 g/dL (ref 12.0–15.0)
Immature Granulocytes: 0 %
Lymphocytes Relative: 30 %
Lymphs Abs: 2.3 10*3/uL (ref 0.7–4.0)
MCH: 27.8 pg (ref 26.0–34.0)
MCHC: 31.7 g/dL (ref 30.0–36.0)
MCV: 87.6 fL (ref 80.0–100.0)
Monocytes Absolute: 0.4 10*3/uL (ref 0.1–1.0)
Monocytes Relative: 6 %
Neutro Abs: 4.1 10*3/uL (ref 1.7–7.7)
Neutrophils Relative %: 54 %
Platelets: 226 10*3/uL (ref 150–400)
RBC: 4.5 MIL/uL (ref 3.87–5.11)
RDW: 13.1 % (ref 11.5–15.5)
WBC: 7.6 10*3/uL (ref 4.0–10.5)
nRBC: 0 % (ref 0.0–0.2)

## 2020-01-09 LAB — COMPREHENSIVE METABOLIC PANEL
ALT: 13 U/L (ref 0–44)
AST: 12 U/L — ABNORMAL LOW (ref 15–41)
Albumin: 3.3 g/dL — ABNORMAL LOW (ref 3.5–5.0)
Alkaline Phosphatase: 42 U/L (ref 38–126)
Anion gap: 7 (ref 5–15)
BUN: 13 mg/dL (ref 6–20)
CO2: 22 mmol/L (ref 22–32)
Calcium: 8.5 mg/dL — ABNORMAL LOW (ref 8.9–10.3)
Chloride: 106 mmol/L (ref 98–111)
Creatinine, Ser: 0.62 mg/dL (ref 0.44–1.00)
GFR calc Af Amer: >60 mL/min (ref >60)
GFR calc non Af Amer: >60 mL/min (ref >60)
Glucose, Bld: 115 mg/dL — ABNORMAL HIGH (ref 70–99)
Potassium: 3.7 mmol/L (ref 3.5–5.1)
Sodium: 135 mmol/L (ref 135–145)
Total Bilirubin: 0.5 mg/dL (ref 0.3–1.2)
Total Protein: 6.5 g/dL (ref 6.5–8.1)

## 2020-01-09 LAB — URINALYSIS, ROUTINE W REFLEX MICROSCOPIC
Bilirubin Urine: NEGATIVE
Glucose, UA: NEGATIVE mg/dL
Ketones, ur: NEGATIVE mg/dL
Leukocytes,Ua: NEGATIVE
Nitrite: NEGATIVE
Protein, ur: 30 mg/dL — AB
RBC / HPF: >50 RBC/hpf — ABNORMAL HIGH (ref 0–5)
Specific Gravity, Urine: 1.03 (ref 1.005–1.030)
WBC, UA: >50 WBC/hpf — ABNORMAL HIGH (ref 0–5)
pH: 5 (ref 5.0–8.0)

## 2020-01-09 LAB — HCG, QUANTITATIVE, PREGNANCY: hCG, Beta Chain, Quant, S: <1 m[IU]/mL (ref <5)

## 2020-01-09 MED ORDER — IBUPROFEN 400 MG PO TABS
400.0000 mg | ORAL_TABLET | Freq: Once | ORAL | Status: AC
  Administered 2020-01-09: 400 mg via ORAL
  Filled 2020-01-09: qty 1

## 2020-01-09 MED ORDER — CEPHALEXIN 500 MG PO CAPS: 500.0000 mg | ORAL_CAPSULE | Freq: Four times a day (QID) | ORAL | 0 refills | Status: DC

## 2020-01-09 MED ORDER — CEPHALEXIN 500 MG PO CAPS
1000.0000 mg | ORAL_CAPSULE | Freq: Once | ORAL | Status: AC
  Administered 2020-01-09: 1000 mg via ORAL
  Filled 2020-01-09: qty 2

## 2020-01-09 MED ORDER — IBUPROFEN 400 MG PO TABS: 400.0000 mg | ORAL_TABLET | Freq: Four times a day (QID) | ORAL | 0 refills | Status: DC | PRN

## 2020-01-09 NOTE — ED Triage Notes (Signed)
Patient states abdominal pain that started today. Patient states pain in her back and in her flank area that started yesterday. Patient does have nausea and no vomiting.

## 2020-01-09 NOTE — ED Provider Notes (Signed)
Emergency Department Provider Note  I have reviewed the triage vital signs and the nursing notes.  HISTORY  Chief Complaint Abdominal Pain   HPI Kaylee Miller is a 29 y.o. female with medical problems documented below who presents to the emergency department today secondary to abdominal pain.  Patient states that yesterday she had some bilateral flank pain but then today she has had some suprapubic pain.  Her menstrual cycle also started today.   She has not noticed any other urinary changes.  She is been drink a lot of water so she has been urinating more but she did this because she thought she might have a kidney stone.  No fever, nausea or vomiting.  No diarrhea or constipation.  No trauma.  Does not think she is pregnant.  No other associated or modifying symptoms.    Past Medical History:  Diagnosis Date  . Contraceptive management 02/08/2015  . Headache(784.0)    migraines  . History of bronchitis   . HSV-2 (herpes simplex virus 2) infection   . Pregnant   . Seasonal allergies     Patient Active Problem List   Diagnosis Date Noted  . Depression 08/26/2018  . Family planning 08/26/2018  . Pregnancy examination or test, negative result 08/26/2018  . Encounter for well woman exam with routine gynecological exam 08/26/2018  . Vaginal discharge 03/24/2018  . Screening examination for STD (sexually transmitted disease) 03/24/2018  . Encounter for surveillance of injectable contraceptive 06/01/2017  . Well woman exam with routine gynecological exam 06/01/2017  . Atypical mole 06/01/2017  . Irregular intermenstrual bleeding 06/01/2017  . Contraceptive management 02/08/2015  . 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 S/P C-section 07/28/2013  . Migraines 01/05/2013  . HSV-2 (herpes simplex virus 2) infection 01/05/2013    Past Surgical History:  Procedure Laterality Date  . adenoids    . CESAREAN SECTION  2007  . CESAREAN SECTION N/A 07/28/2013   Procedure: CESAREAN SECTION;  Surgeon: Jonnie Kind, MD;  Location: [facility];  Service: Obstetrics;  Laterality: N/A;  . tubes in ears      Current Outpatient Rx  . Order #: [id number] Class: Historical Med  . Order #: [id number] Class: Normal  . Order #: [id number] Class: Normal  . Order #: [id number] Class: Normal  . Order #: [id number] Class: Normal    Allergies Latex  Family History  Problem Relation Age of Onset  . Cancer Mother   . Hypertension Father   . Stroke Father   . CAD Maternal Grandfather   . CAD Paternal Grandmother   . CAD Paternal Grandfather   . Diabetes Other        great-great grandmother  . CAD Other     Social History Social History   Tobacco Use  . Smoking status: Current Some Day Smoker    Packs/day: 1.00    Years: 9.00    Pack years: 9.00    Types: Cigarettes  . Smokeless tobacco: Never Used  Substance Use Topics  . Alcohol use: Not Currently    Alcohol/week: 0.0 standard drinks    Comment: occasional  . Drug use: No    Review of Systems  All other systems negative except as documented in the HPI. All pertinent positives and negatives as reviewed in the HPI. ____________________________________________  PHYSICAL EXAM:  VITAL SIGNS: ED Triage Vitals  Enc Vitals Group     BP 01/09/20 0026 104/67     Pulse Rate 01/09/20 0024 71  Resp 01/09/20 0024 18     Temp 01/09/20 0025 99.2 F (37.3 C)     Temp Source 01/09/20 0024 Oral     SpO2 01/09/20 0024 98 %     Weight 01/09/20 0026 185 lb (83.9 kg)     Height 01/09/20 0026 5\' 7"  (1.702 m)    Constitutional: Alert and oriented. Well appearing and in no acute distress. Eyes: Conjunctivae are normal. PERRL. EOMI. Head: Atraumatic. Nose: No congestion/rhinnorhea. Mouth/Throat: Mucous membranes are moist.  Oropharynx non-erythematous. Neck: No stridor.  No meningeal signs.   Cardiovascular: Normal rate, regular rhythm. Good peripheral circulation. Grossly normal heart sounds.     Respiratory: Normal respiratory effort.  No retractions. Lungs CTAB. Gastrointestinal: Soft and mild suprapubic ttp. No distention.  Musculoskeletal: No lower extremity tenderness nor edema. No gross deformities of extremities. Neurologic:  Normal speech and language. No gross focal neurologic deficits are appreciated.  Skin:  Skin is warm, dry and intact. No rash noted.  ____________________________________________   LABS (all labs ordered are listed, but only abnormal results are displayed)  Labs Reviewed  CBC WITH DIFFERENTIAL/PLATELET - Abnormal; Notable for the following components:      Result Value   Eosinophils Absolute 0.7 (*)    All other components within normal limits  COMPREHENSIVE METABOLIC PANEL - Abnormal; Notable for the following components:   Glucose, Bld 115 (*)    Calcium 8.5 (*)    Albumin 3.3 (*)    AST 12 (*)    All other components within normal limits  URINALYSIS, ROUTINE W REFLEX MICROSCOPIC - Abnormal; Notable for the following components:   APPearance HAZY (*)    Hgb urine dipstick LARGE (*)    Protein, ur 30 (*)    RBC / HPF >50 (*)    WBC, UA >50 (*)    Bacteria, UA RARE (*)    All other components within normal limits  HCG, QUANTITATIVE, PREGNANCY   ____________________________________________  EKG   EKG Interpretation  Date/Time:    Ventricular Rate:    PR Interval:    QRS Duration:   QT Interval:    QTC Calculation:   R Axis:     Text Interpretation:         ____________________________________________  RADIOLOGY  No results found. ____________________________________________  PROCEDURES  Procedure(s) performed:   Procedures ____________________________________________  INITIAL IMPRESSION / ASSESSMENT AND PLAN / ED COURSE   This patient presents to the ED for concern of abdominal pain, this involves an extensive number of treatment options, and is a complaint that carries with it a high risk of complications and  morbidity.  The differential diagnosis includes UTI, nephrolithiasis, pyelonephritis, menstrual cycle pain.     Lab Tests:   I Ordered, reviewed, and interpreted labs, which included unremarkable CBC BMP is normal.  Urinalysis consistent with likely urinary tract infection versus contamination but will start antibiotics secondary to her symptoms  Medicines ordered:   I ordered medication Keflex for UTI and ibuprofen for her pain  Imaging Studies ordered:   None indicated  Additional history obtained:   Additional history obtained from no one  Previous records obtained and reviewed in epic  Consultations Obtained:   I consulted no one and discussed lab and imaging findings  Reevaluation:  After the interventions stated above, I reevaluated the patient and found improved pain with ibuprofen.  Critical Interventions: None   A medical screening exam was performed and I feel the patient has had an appropriate workup for their  chief complaint at this time and likelihood of emergent condition existing is low. They have been counseled on decision, discharge, follow up and which symptoms necessitate immediate return to the emergency department. They or their family verbally stated understanding and agreement with plan and discharged in stable condition.   ____________________________________________  FINAL CLINICAL IMPRESSION(S) / ED DIAGNOSES  Final diagnoses:  Acute cystitis with hematuria    MEDICATIONS GIVEN DURING THIS VISIT:  Medications  ibuprofen (ADVIL) tablet 400 mg (400 mg Oral Given 01/09/20 0111)  cephALEXin (KEFLEX) capsule 1,000 mg (1,000 mg Oral Given 01/09/20 0355)    NEW OUTPATIENT MEDICATIONS STARTED DURING THIS VISIT:  Discharge Medication List as of 01/09/2020  3:47 AM    START taking these medications   Details  cephALEXin (KEFLEX) 500 MG capsule Take 1 capsule (500 mg total) by mouth 4 (four) times daily., Starting Mon 01/09/2020, Normal         Note:  This note was prepared with assistance of Dragon voice recognition software. Occasional wrong-word or sound-a-like substitutions may have occurred due to the inherent limitations of voice recognition software.   Romelle Muldoon, Corene Cornea, MD 01/09/20 [phone]

## 2020-01-10 ENCOUNTER — Other Ambulatory Visit: Payer: Self-pay

## 2020-01-10 ENCOUNTER — Ambulatory Visit (INDEPENDENT_AMBULATORY_CARE_PROVIDER_SITE_OTHER): Payer: Medicaid Other | Admitting: *Deleted

## 2020-01-10 DIAGNOSIS — Z3042 Encounter for surveillance of injectable contraceptive: Secondary | ICD-10-CM

## 2020-01-10 MED ORDER — MEDROXYPROGESTERONE ACETATE 150 MG/ML IM SUSP
150.0000 mg | Freq: Once | INTRAMUSCULAR | Status: AC
  Administered 2020-01-10: 150 mg via INTRAMUSCULAR

## 2020-01-10 NOTE — Progress Notes (Signed)
   NURSE VISIT- INJECTION  SUBJECTIVE:  Kaylee Miller is a 29 y.o. G60P2002 female here for a Depo Provera for contraception/period management. She is a GYN patient.   OBJECTIVE:  LMP 01/09/2020   Appears well, in no apparent distress  Injection administered in: Right deltoid  Meds ordered this encounter  Medications  . medroxyPROGESTERone (DEPO-PROVERA) injection 150 mg    ASSESSMENT: GYN patient Depo Provera for contraception/period management PLAN: Follow-up: in 11-13 weeks for next Depo   [name]  01/10/2020 10:54 AM

## 2020-01-13 ENCOUNTER — Other Ambulatory Visit: Payer: Self-pay | Admitting: Adult Health

## 2020-01-13 MED ORDER — FLUCONAZOLE 150 MG PO TABS: ORAL_TABLET | ORAL | 1 refills | Status: DC

## 2020-01-13 NOTE — Progress Notes (Signed)
rx diflucan  

## 2020-02-03 DIAGNOSIS — Z23 Encounter for immunization: Secondary | ICD-10-CM | POA: Diagnosis not present

## 2020-04-03 ENCOUNTER — Ambulatory Visit: Payer: Medicaid Other

## 2020-04-04 ENCOUNTER — Encounter: Payer: Self-pay | Admitting: *Deleted

## 2020-04-04 ENCOUNTER — Ambulatory Visit (INDEPENDENT_AMBULATORY_CARE_PROVIDER_SITE_OTHER): Payer: Medicaid Other | Admitting: *Deleted

## 2020-04-04 DIAGNOSIS — Z3042 Encounter for surveillance of injectable contraceptive: Secondary | ICD-10-CM

## 2020-04-04 MED ORDER — MEDROXYPROGESTERONE ACETATE 150 MG/ML IM SUSP
150.0000 mg | Freq: Once | INTRAMUSCULAR | Status: AC
  Administered 2020-04-04: 150 mg via INTRAMUSCULAR

## 2020-04-04 NOTE — Progress Notes (Signed)
   NURSE VISIT- INJECTION  SUBJECTIVE:  Kaylee Miller is a 29 y.o. G3P2002 female here for a Depo Provera for contraception/period management. She is a GYN patient.   OBJECTIVE:  There were no vitals taken for this visit.  Appears well, in no apparent distress  Injection administered in: Left upper quad. gluteus  No orders of the defined types were placed in this encounter.   ASSESSMENT: GYN patient Depo Provera for contraception/period management PLAN: Follow-up: in 11-13 weeks for next Depo   Kaylee Miller  04/04/2020 11:27 AM

## 2020-06-01 ENCOUNTER — Telehealth: Payer: Self-pay

## 2020-06-01 MED ORDER — NAPROXEN SODIUM 550 MG PO TABS: ORAL_TABLET | ORAL | 3 refills | Status: DC

## 2020-06-01 MED ORDER — PROMETHAZINE HCL 25 MG PO TABS: 25.0000 mg | ORAL_TABLET | Freq: Four times a day (QID) | ORAL | 1 refills | Status: DC | PRN

## 2020-06-01 NOTE — Telephone Encounter (Signed)
Pt states she was given a med for cramps that started with a "T". Pt can't remember the name. Pt is having nausea with the cramps. Can you order a nausea med also?  Also, pt is requesting a work note for today. Thanks!! Kaylee Miller

## 2020-06-01 NOTE — Telephone Encounter (Signed)
Pt requesting a medication for her cramps and is also requesting a note for her job because she had to leave work for this issue.

## 2020-06-01 NOTE — Telephone Encounter (Signed)
Pt having cramps,and has nausea, will rx Anaprox ds and phenergan

## 2020-06-09 ENCOUNTER — Encounter (HOSPITAL_COMMUNITY): Payer: Self-pay

## 2020-06-09 ENCOUNTER — Other Ambulatory Visit: Payer: Self-pay

## 2020-06-09 DIAGNOSIS — J9801 Acute bronchospasm: Secondary | ICD-10-CM | POA: Insufficient documentation

## 2020-06-09 DIAGNOSIS — Z20822 Contact with and (suspected) exposure to covid-19: Secondary | ICD-10-CM | POA: Diagnosis not present

## 2020-06-09 DIAGNOSIS — Z91040 Latex allergy status: Secondary | ICD-10-CM | POA: Insufficient documentation

## 2020-06-09 DIAGNOSIS — F17210 Nicotine dependence, cigarettes, uncomplicated: Secondary | ICD-10-CM | POA: Diagnosis not present

## 2020-06-09 DIAGNOSIS — R059 Cough, unspecified: Secondary | ICD-10-CM | POA: Diagnosis present

## 2020-06-09 LAB — RESPIRATORY PANEL BY RT PCR (FLU A&B, COVID)
Influenza A by PCR: NEGATIVE
Influenza B by PCR: NEGATIVE
SARS Coronavirus 2 by RT PCR: NEGATIVE

## 2020-06-09 NOTE — ED Triage Notes (Signed)
Pt to er, pt states that she is here for a cough and sob, states that she had a sudden onset of sob when she was outside smoking a cigarette, states that her daughter has a cold and she thinks she got the cold from her.  Pt has runny nose.

## 2020-06-10 ENCOUNTER — Emergency Department (HOSPITAL_COMMUNITY)
Admission: EM | Admit: 2020-06-10 | Discharge: 2020-06-10 | Disposition: A | Discharge status: Home / Self Care | Payer: Medicaid Other | Attending: Emergency Medicine | Admitting: Emergency Medicine

## 2020-06-10 DIAGNOSIS — J209 Acute bronchitis, unspecified: Secondary | ICD-10-CM

## 2020-06-10 MED ORDER — AMOXICILLIN 500 MG PO CAPS: 500.0000 mg | ORAL_CAPSULE | Freq: Three times a day (TID) | ORAL | 0 refills | Status: DC

## 2020-06-10 MED ORDER — ALBUTEROL (5 MG/ML) CONTINUOUS INHALATION SOLN
10.0000 mg/h | INHALATION_SOLUTION | Freq: Once | RESPIRATORY_TRACT | Status: AC
  Administered 2020-06-10: 10 mg/h via RESPIRATORY_TRACT
  Filled 2020-06-10: qty 20

## 2020-06-10 MED ORDER — PREDNISONE 50 MG PO TABS
60.0000 mg | ORAL_TABLET | Freq: Once | ORAL | Status: AC
  Administered 2020-06-10: 60 mg via ORAL
  Filled 2020-06-10: qty 1

## 2020-06-10 MED ORDER — PREDNISONE 20 MG PO TABS: ORAL_TABLET | ORAL | 0 refills | Status: DC

## 2020-06-10 MED ORDER — ALBUTEROL SULFATE HFA 108 (90 BASE) MCG/ACT IN AERS: 2.0000 | INHALATION_SPRAY | Freq: Four times a day (QID) | RESPIRATORY_TRACT | 0 refills | Status: DC | PRN

## 2020-06-10 MED ORDER — AMOXICILLIN 250 MG PO CAPS
500.0000 mg | ORAL_CAPSULE | Freq: Once | ORAL | Status: AC
  Administered 2020-06-10: 500 mg via ORAL
  Filled 2020-06-10: qty 2

## 2020-06-10 NOTE — ED Provider Notes (Signed)
[facility] EMERGENCY DEPARTMENT Provider Note   CSN: [id number] Arrival date & time: 06/09/20  2148   Time seen 12:58 AM  History Chief Complaint  Patient presents with  . Cough    Kaylee Miller is a 29 y.o. female.  HPI   Patient states she has seasonal bronchitis and it usually bothers her in December and January.  She has been on inhaler in the past and has an inhaler that she has been using most recently.  She states she started feeling ill on October 15.  She states she is coughing and coughing up yellow mucus, she has clear rhinorrhea and sneezing but if she blows her nose she is seeing yellow mucus.  She denies fever, sore throat, nausea, vomiting, or diarrhea.  She states she has a central chest pain that hurts more when she coughs.  She also states that her chest feels tight.  Patient does smoke and states tonight when she was at work she went outside to smoke and she could not because it made her breathing so much worse.  Patient states she took the [place] vaccine in July  PCP Patient, No Pcp Per   Past Medical History:  Diagnosis Date  . Contraceptive management 02/08/2015  . Headache(784.0)    migraines  . History of bronchitis   . HSV-2 (herpes simplex virus 2) infection   . Pregnant   . Seasonal allergies     Patient Active Problem List   Diagnosis Date Noted  . Depression 08/26/2018  . Family planning 08/26/2018  . Pregnancy examination or test, negative result 08/26/2018  . Encounter for well woman exam with routine gynecological exam 08/26/2018  . Vaginal discharge 03/24/2018  . Screening examination for STD (sexually transmitted disease) 03/24/2018  . Encounter for surveillance of injectable contraceptive 06/01/2017  . Well woman exam with routine gynecological exam 06/01/2017  . Atypical mole 06/01/2017  . Irregular intermenstrual bleeding 06/01/2017  . Contraceptive management 02/08/2015  . 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 S/P C-section 07/28/2013  . Migraines 01/05/2013  . HSV-2 (herpes simplex virus 2) infection 01/05/2013    Past Surgical History:  Procedure Laterality Date  . adenoids    . CESAREAN SECTION  2007  . CESAREAN SECTION N/A 07/28/2013   Procedure: CESAREAN SECTION;  Surgeon: Jonnie Kind, MD;  Location: [facility];  Service: Obstetrics;  Laterality: N/A;  . tubes in ears       OB History    Gravida  2   Para  2   Term  2   Preterm      AB      Living  2     SAB      TAB      Ectopic      Multiple      Live Births  2           Family History  Problem Relation Age of Onset  . Cancer Mother   . Hypertension Father   . Stroke Father   . CAD Maternal Grandfather   . CAD Paternal Grandmother   . CAD Paternal Grandfather   . Diabetes Other        great-great grandmother  . CAD Other     Social History   Tobacco Use  . Smoking status: Current Some Day Smoker    Packs/day: 1.00    Years: 9.00    Pack years: 9.00    Types: Cigarettes  .  Smokeless tobacco: Never Used  Vaping Use  . Vaping Use: Never used  Substance Use Topics  . Alcohol use: Yes    Alcohol/week: 0.0 standard drinks  . Drug use: Never  employed  Home Medications Prior to Admission medications   Medication Sig Start Date End Date Taking? Authorizing Provider  acetaminophen (TYLENOL) 500 MG tablet Take 1,500 mg every 6 (six) hours as needed by mouth for moderate pain or headache.    [provider]  albuterol (VENTOLIN HFA) 108 (90 Base) MCG/ACT inhaler Inhale 2 puffs into the lungs every 6 (six) hours as needed for wheezing or shortness of breath. 06/10/20   Rolland Porter, MD  amoxicillin (AMOXIL) 500 MG capsule Take 1 capsule (500 mg total) by mouth 3 (three) times daily. 06/10/20   Rolland Porter, MD  cephALEXin (KEFLEX) 500 MG capsule Take 1 capsule (500 mg total) by mouth 4 (four) times daily. 01/09/20   Mesner, Corene Cornea, MD  fluconazole (DIFLUCAN) 150 MG tablet Take 1 now and 1 in  3 days 01/13/20   Estill Dooms, NP  medroxyPROGESTERone Acetate 150 MG/ML SUSY INJECT 1 ML INTO THE MUSCLE EVERY 3 MONTHS 10/21/19   Estill Dooms, NP  megestrol (MEGACE) 40 MG tablet Take 3 x 5 days then 2 x 5 days then 1 daily 07/27/19   Estill Dooms, NP  naproxen sodium (ANAPROX DS) 550 MG tablet Take 1 every 12 hours prn cramps 06/01/20   Estill Dooms, NP  predniSONE (DELTASONE) 20 MG tablet Take 3 po QD x 3d , then 2 po QD x 3d then 1 po QD x 3d 06/10/20   Rolland Porter, MD  promethazine (PHENERGAN) 25 MG tablet Take 1 tablet (25 mg total) by mouth every 6 (six) hours as needed for nausea or vomiting. 06/01/20   Estill Dooms, NP  Patient states she takes Depo-Provera and her next injection is due in November  Allergies    Latex  Review of Systems   Review of Systems  All other systems reviewed and are negative.   Physical Exam Updated Vital Signs BP 122/71 (BP Location: Right Arm)   Pulse 94   Temp 99.1 F (37.3 C) (Oral)   Resp 20   Ht 5\' 7"  (1.702 m)   Wt 95.3 kg   SpO2 96%   BMI 32.89 kg/m   Physical Exam Vitals and nursing note reviewed.  Constitutional:      General: She is in acute distress.     Appearance: She is obese.     Comments: Looks like she feels bad  HENT:     Head: Normocephalic and atraumatic.     Right Ear: External ear normal.     Left Ear: External ear normal.     Nose: No congestion or rhinorrhea.     Mouth/Throat:     Mouth: Mucous membranes are moist.     Pharynx: No oropharyngeal exudate or posterior oropharyngeal erythema.  Eyes:     Extraocular Movements: Extraocular movements intact.     Conjunctiva/sclera: Conjunctivae normal.     Pupils: Pupils are equal, round, and reactive to light.  Cardiovascular:     Rate and Rhythm: Normal rate and regular rhythm.     Pulses: Normal pulses.     Heart sounds: Normal heart sounds.  Pulmonary:     Effort: Tachypnea, prolonged expiration and respiratory distress  present. No accessory muscle usage or retractions.     Breath sounds: Examination of the right-upper field  reveals wheezing. Examination of the left-upper field reveals wheezing. Examination of the right-middle field reveals wheezing. Examination of the left-middle field reveals wheezing. Examination of the right-lower field reveals wheezing. Examination of the left-lower field reveals wheezing. Decreased breath sounds and wheezing present. No rhonchi.  Musculoskeletal:        General: Normal range of motion.     Cervical back: Normal range of motion.  Skin:    General: Skin is warm and dry.  Neurological:     General: No focal deficit present.     Mental Status: She is alert and oriented to person, place, and time.     Cranial Nerves: No cranial nerve deficit.     Coordination: Abnormal coordination:   Psychiatric:        Mood and Affect: Mood normal.        Behavior: Behavior normal.        Thought Content: Thought content normal.     ED Results / Procedures / Treatments   Labs (all labs ordered are listed, but only abnormal results are displayed) Results for orders placed or performed during the hospital encounter of 06/10/20  Respiratory Panel by RT PCR (Flu A&B, Covid) - Nasopharyngeal Swab   Specimen: Nasopharyngeal Swab  Result Value Ref Range   SARS Coronavirus 2 by RT PCR NEGATIVE NEGATIVE   Influenza A by PCR NEGATIVE NEGATIVE   Influenza B by PCR NEGATIVE NEGATIVE   Laboratory interpretation all normal     EKG None  Radiology No results found.  Procedures Procedures (including critical care time)  Medications Ordered in ED Medications  albuterol (PROVENTIL,VENTOLIN) solution continuous neb (10 mg/hr Nebulization Given 06/10/20 0126)  predniSONE (DELTASONE) tablet 60 mg (60 mg Oral Given 06/10/20 0219)  amoxicillin (AMOXIL) capsule 500 mg (500 mg Oral Given 06/10/20 0218)  albuterol (PROVENTIL,VENTOLIN) solution continuous neb (10 mg/hr Nebulization Given  06/10/20 0325)    ED Course  I have reviewed the triage vital signs and the nursing notes.  Pertinent labs & imaging results that were available during my care of the patient were reviewed by me and considered in my medical decision making (see chart for details).    MDM Rules/Calculators/A&P                         Patient's Covid test came back negative, she was given albuterol continuous nebulizer 10 mg.  She also was started on oral prednisone and due to her being a smoker she was started on amoxicillin for probable bacterial bronchitis.   Recheck at 2:45 AM patient is resting peacefully.  When I listen to her she has improved air movement, she now has diffuse rhonchi and a few wheezes.  We discussed I think she can get 1 more nebulizer and then she will probably be able to be discharged home.  Recheck at 4:00 AM patient sleeping.  She states she is feeling better.  Her continuous nebulizer looks like it will be done in the next few minutes.  When I listen to her she has improved air movement and she has rare rhonchi.  But overall she is much improved.  Patient has her inhaler with her and she still has 45 doses left on that inhaler.  Final Clinical Impression(s) / ED Diagnoses Final diagnoses:  Bronchitis with bronchospasm    Rx / DC Orders ED Discharge Orders         Ordered    amoxicillin (AMOXIL) 500 MG capsule  3 times daily        06/10/20 0409    albuterol (VENTOLIN HFA) 108 (90 Base) MCG/ACT inhaler  Every 6 hours PRN        06/10/20 0409    predniSONE (DELTASONE) 20 MG tablet        06/10/20 0411         Plan discharge  Rolland Porter, MD, Barbette Or, MD 06/10/20 [phone]

## 2020-06-10 NOTE — Discharge Instructions (Addendum)
Drink plenty of fluids.  Take the antibiotics and prednisone until gone.  Use your inhaler 2 puffs every 6 hours as needed for wheezing or shortness of breath.  Try to stop smoking!  Recheck if you get worse.

## 2020-06-11 ENCOUNTER — Telehealth: Payer: Self-pay

## 2020-06-11 DIAGNOSIS — J209 Acute bronchitis, unspecified: Secondary | ICD-10-CM

## 2020-06-11 NOTE — Telephone Encounter (Signed)
Transition Care Management Follow-up Telephone Call  Date of discharge and from where: 06/10/2020 from [facility]  How have you been since you were released from the hospital? Patient is still feeling bad and coughing. Patient stated that her 31yo daughter just tested positive for COVID. Patient is not sure if she will be able to get to the store for food and other items that she needs.   Any questions or concerns? No  Items Reviewed:  Did the pt receive and understand the discharge instructions provided? Yes   Medications obtained and verified? Yes   Other? No   Any new allergies since your discharge? No   Dietary orders reviewed? N/A  Do you have support at home? Yes   Functional Questionnaire: (I = Independent and D = Dependent) ADLs: I  Bathing/Dressing- I  Meal Prep- I  Eating- I  Maintaining continence- I  Transferring/Ambulation- I  Managing Meds- I  Follow up appointments reviewed:   PCP Hospital f/u appt confirmed? No  Patient informed that PCP will contact to establish.   Are transportation arrangements needed? No   If their condition worsens, is the pt aware to call PCP or go to the Emergency Dept.? Yes  Was the patient provided with contact information for the PCP's office or ED? Yes  Was to pt encouraged to call back with questions or concerns? Yes

## 2020-06-12 ENCOUNTER — Telehealth: Payer: Self-pay | Admitting: *Deleted

## 2020-06-12 NOTE — Telephone Encounter (Signed)
Emailed request to [facility] to schedule  NP appointment .   Lorenda Grecco PEC [phone]                                                                                       

## 2020-06-13 ENCOUNTER — Other Ambulatory Visit: Payer: Self-pay

## 2020-06-13 NOTE — Patient Outreach (Signed)
Care Coordination  06/13/2020  ADREANNA FICKEL [birth year] [id number]  Channa Hazelett Prewitt is a 29 y.o. year old female who sees Patient, No Pcp Per for primary care. The  [facility] Managed Care team was consulted for assistance with Patient does not have a PCP. Housing barriers Lack of essential utilities, and rent due to being out of work... Ms. Leone was given information about Care Management services, agreed to services, and verbal consent for services was obtained.  Interventions:   Patient interviewed and appropriate assessments performed  Collaborated with clinical team regarding patient needs   SDOH (Social Determinants of Health) assessments performed: Yes     Provided patient with information about resources that can assist with rent and utilites in the area. The HOPE Program 7626760231, [place] program [phone], and the [employer] [phone].  Plan:   Over the next 30 days, patient will work with BSW to address needs related to Housing barriers  Lack of essential utilities - rent and utlities due to being out of work due to [place]  Education officer, museum will follow up with patient in 30 days.[name], BSW, [place]  High Risk Managed Medicaid Team

## 2020-06-13 NOTE — Patient Instructions (Signed)
Visit Information  Kaylee Miller was given information about Medicaid Managed Care team care coordination services as a part of their Healthy [employer] Medicaid benefit. Kaylee Miller verbally consented to engagement with the [facility] Managed Care team.    The resources given to you today were the [facility], they can be reached at  [phone]. The [employer] [phone], and  the [employer] [phone].  For questions related to your Healthy [facility] health plan, please call: [phone] or visit the homepage here: [email or web address]  If you would like to schedule transportation through your Healthy [facility] plan, please call the following number at least 2 days in advance of your appointment: [phone]    Social Worker will follow up with patient in 30 days.[name], BSW, [place]  High Risk Managed Medicaid Team

## 2020-06-14 ENCOUNTER — Ambulatory Visit: Payer: Self-pay

## 2020-06-14 ENCOUNTER — Other Ambulatory Visit: Payer: Self-pay

## 2020-06-14 ENCOUNTER — Other Ambulatory Visit: Payer: Medicaid Other

## 2020-06-14 DIAGNOSIS — Z20822 Contact with and (suspected) exposure to covid-19: Secondary | ICD-10-CM | POA: Diagnosis not present

## 2020-06-15 ENCOUNTER — Other Ambulatory Visit: Payer: Medicaid Other

## 2020-06-15 LAB — NOVEL CORONAVIRUS, NAA: SARS-CoV-2, NAA: NOT DETECTED

## 2020-06-15 LAB — SPECIMEN STATUS REPORT

## 2020-06-15 LAB — SARS-COV-2, NAA 2 DAY TAT

## 2020-06-15 NOTE — Patient Outreach (Signed)
Care Coordination  06/15/2020  Kaylee Miller 16-Mar-1991 [id number]  An unsuccessful telephone outreach was attempted today. The patient was referred to the case management team for assistance with care management and care coordination.   Follow Up Plan: The Managed Medicaid care management team will reach out to the patient again over the next 7 days.   Aida Raider RN, BSN [place]  [employer] - Managed Medicaid High Risk [phone]

## 2020-06-15 NOTE — Patient Instructions (Signed)
HI Kaylee Miller I missed you today,  as a part of your Medicaid benefit, you are eligible for care management and care coordination services at no cost or copay. I was unable to reach you by phone today but would be happy to help you with your health related needs. Please feel free to call me at [phone].  A member of the Managed Medicaid care management team will reach out to you again over the next 7 days.   Aida Raider RN, BSN [place]  [employer]ator - Managed Medicaid High Risk [phone].

## 2020-06-22 ENCOUNTER — Other Ambulatory Visit: Payer: Self-pay

## 2020-06-22 NOTE — Patient Outreach (Signed)
Care Coordination  06/22/2020  Kaylee Miller 1990/09/10 [id number]  A second unsuccessful telephone outreach was attempted today. The patient was referred to the case management team for assistance with care management and care coordination.   Follow Up Plan: The Managed Medicaid care management team will reach out to the patient again over the next 7 days.   Aida Raider RN, BSN [place]  [employer]ator - Managed Medicaid High Risk [phone]

## 2020-06-22 NOTE — Patient Instructions (Signed)
Hi Ms. Smaltz, sorry we missed you today, - as a part of your Medicaid benefit, you are eligible for care management and care coordination services at no cost or copay. I was unable to reach you by phone today but would be happy to help you with your health related needs. Please feel free to call me at [phone].  A member of the Managed Medicaid care management team will reach out to you again over the next 7 days.   Aida Raider RN, BSN [place]  [employer]ator - Managed Medicaid High Risk [phone].

## 2020-06-27 ENCOUNTER — Ambulatory Visit: Payer: Medicaid Other

## 2020-06-28 ENCOUNTER — Other Ambulatory Visit: Payer: Self-pay | Admitting: Obstetrics and Gynecology

## 2020-06-28 ENCOUNTER — Other Ambulatory Visit: Payer: Self-pay

## 2020-06-28 NOTE — Patient Instructions (Signed)
Hi Kaylee Miller, thank you for speaking with me today.  For questions related to your Healthy [facility] health plan, please call: [phone] or visit the homepage here: [email or web address]  If you would like to schedule transportation through your Healthy [facility] plan, please call the following number at least 2 days in advance of your appointment: [phone]  The patient has been provided with contact information for the Managed Medicaid care management team and has been advised to call with any health related questions or concerns.   Aida Raider RN, BSN [place]  [employer]ator - Managed Medicaid High Risk [phone].

## 2020-06-28 NOTE — Patient Outreach (Signed)
Care Coordination  06/28/2020  Kaylee Miller October 26, 1990 [id number]  RNCM called patient today as a follow up from ED visit.  Patient states she is better and has returned to work.  She has no needs at this time.  She was given contact information.  Please refer again if needed.  Aida Raider RN, BSN [place]   [employer]ator - Managed Medicaid High Risk [phone].

## 2020-07-03 ENCOUNTER — Ambulatory Visit: Payer: Medicaid Other | Admitting: Internal Medicine

## 2020-07-03 ENCOUNTER — Ambulatory Visit (INDEPENDENT_AMBULATORY_CARE_PROVIDER_SITE_OTHER): Payer: Medicaid Other | Admitting: *Deleted

## 2020-07-03 ENCOUNTER — Encounter: Payer: Self-pay | Admitting: Internal Medicine

## 2020-07-03 ENCOUNTER — Ambulatory Visit: Payer: Medicaid Other

## 2020-07-03 ENCOUNTER — Other Ambulatory Visit: Payer: Self-pay

## 2020-07-03 VITALS — BP 114/74 | HR 77 | Temp 98.9°F | Resp 18 | Ht 67.0 in | Wt 194.1 lb

## 2020-07-03 DIAGNOSIS — Z23 Encounter for immunization: Secondary | ICD-10-CM

## 2020-07-03 DIAGNOSIS — Z7689 Persons encountering health services in other specified circumstances: Secondary | ICD-10-CM | POA: Diagnosis not present

## 2020-07-03 DIAGNOSIS — G43009 Migraine without aura, not intractable, without status migrainosus: Secondary | ICD-10-CM | POA: Diagnosis not present

## 2020-07-03 DIAGNOSIS — R42 Dizziness and giddiness: Secondary | ICD-10-CM

## 2020-07-03 DIAGNOSIS — Z3042 Encounter for surveillance of injectable contraceptive: Secondary | ICD-10-CM | POA: Diagnosis not present

## 2020-07-03 DIAGNOSIS — J45909 Unspecified asthma, uncomplicated: Secondary | ICD-10-CM

## 2020-07-03 DIAGNOSIS — Z720 Tobacco use: Secondary | ICD-10-CM

## 2020-07-03 DIAGNOSIS — J45901 Unspecified asthma with (acute) exacerbation: Secondary | ICD-10-CM | POA: Insufficient documentation

## 2020-07-03 DIAGNOSIS — R55 Syncope and collapse: Secondary | ICD-10-CM

## 2020-07-03 DIAGNOSIS — N921 Excessive and frequent menstruation with irregular cycle: Secondary | ICD-10-CM

## 2020-07-03 MED ORDER — MEDROXYPROGESTERONE ACETATE 150 MG/ML IM SUSP
150.0000 mg | Freq: Once | INTRAMUSCULAR | Status: AC
  Administered 2020-07-03: 150 mg via INTRAMUSCULAR

## 2020-07-03 MED ORDER — SUMATRIPTAN SUCCINATE 50 MG PO TABS: 50.0000 mg | ORAL_TABLET | ORAL | 2 refills | Status: DC | PRN

## 2020-07-03 NOTE — Progress Notes (Signed)
   NURSE VISIT- INJECTION  SUBJECTIVE:  Kaylee Miller is a 29 y.o. [id number] female here for a Depo Provera for contraception/period management. She is a GYN patient.   OBJECTIVE:  There were no vitals taken for this visit.  Appears well, in no apparent distress  Injection administered in: Right upper quad. gluteus  Meds ordered this encounter  Medications  . medroxyPROGESTERone (DEPO-PROVERA) injection 150 mg    ASSESSMENT: GYN patient Depo Provera for contraception/period management PLAN: Follow-up: in 11-13 weeks for next Depo   Seldon Barrell A Ghina Bittinger  07/03/2020 1:45 PM

## 2020-07-03 NOTE — Assessment & Plan Note (Signed)
Asked about quitting: confirms that she currently smokes cigarettes Advise to quit smoking: Educated about QUITTING to reduce the risk of cancer, cardio and cerebrovascular disease. Assess willingness: Unwilling to quit at this time, but is working on cutting back. Assist with counseling and pharmacotherapy: Counseled for 5 minutes and literature provided. Arrange for follow up: Follow up and continue to offer help.

## 2020-07-03 NOTE — Assessment & Plan Note (Signed)
Uses albuterol inhaler as needed ?

## 2020-07-03 NOTE — Patient Instructions (Signed)
Please take Sumatriptan as prescribed for migraine. Please avoid excessive sunlight, noise or crowded places to avoid migraine and episodes of presyncope (fall).  Please continue to follow up with Ob./Gyn. for PAP smear.  You were given flu vaccine in the office today. It is normal to have local soreness for up to 2 days.

## 2020-07-03 NOTE — Assessment & Plan Note (Signed)
Care established Previous chart reviewed History and medications reviewed with the patient 

## 2020-07-03 NOTE — Assessment & Plan Note (Signed)
Get medroxyprogesterone injection every 3 months Follows up with OB/GYN Takes Megace as needed for heavy bleeding

## 2020-07-03 NOTE — Assessment & Plan Note (Addendum)
Related to excessive stress and/or dehydration Advised to stay hydrated, at least 2 l of fluid intake F/u CBC and CMP Avoid stressful situations, excessive sunlight or noise, crowded places

## 2020-07-03 NOTE — Progress Notes (Signed)
Established Patient Office Visit  Subjective:  Patient ID: Kaylee Miller, female    DOB: [birth year]  Age: 29 y.o. MRN: [id number]  CC:  Chief Complaint  Patient presents with  . New Patient (Initial Visit)    new pt has not had primary she has been having a lot of migraines and also has been feeling jittery and passing out spells but this has been going on for about 6 years     HPI [name] is a 28 year old female with PMH of migraine, irregular menstrual cycles, and tobacco abuse presents for establishing care.  She has a longstanding history of migraine.  Her headache is usually in the forehead and is associated with photophobia and phonophobia.  She denies any nausea or vomiting.  She denies watering of eyes, runny nose or nasal congestion along with the headache.  Headache lasts from few hours up to 2 days.  She takes ibuprofen first and then takes Excedrin to help with the headache.  Patient has a longstanding history of irregular menstrual cycles.  She follows up with OB/GYN.  She has been taking medroxyprogesterone injection for contraception.  She uses megestrol to help with heavy menstrual bleeding.  Patient has had episodes of dizziness in the past.  She states that she had an episode of presyncope once, but denies any seizure-like activity.  She attributes her symptoms to stress related to work.  She has had difficulty sleeping, for which she used to take melatonin and ZZquil.  She denies anhedonia, appetite change or suicidal ideation.  She admits to smoking about 0.5 packs/day.  She has been trying to cut down.  She prefers to continue her efforts without any medications for now.  Last Pap smear was in 05/2017.  Patient is up-to-date with COVID vaccination. She received flu vaccine in the office today.    Past Medical History:  Diagnosis Date  . Asthma    Phreesia 07/02/2020  . Contraceptive management 02/08/2015  . Headache(784.0)    migraines  .  History of bronchitis   . HSV-2 (herpes simplex virus 2) infection   . Pregnant   . S/P C-section 07/28/2013  . Seasonal allergies     Past Surgical History:  Procedure Laterality Date  . adenoids    . CESAREAN SECTION  2007  . CESAREAN SECTION N/A 07/28/2013   Procedure: CESAREAN SECTION;  Surgeon: Jonnie Kind, MD;  Location: [facility];  Service: Obstetrics;  Laterality: N/A;  . tubes in ears      Family History  Problem Relation Age of Onset  . Cancer Mother   . Hypertension Father   . Stroke Father   . CAD Maternal Grandfather   . CAD Paternal Grandmother   . CAD Paternal Grandfather   . Diabetes Other        great-great grandmother  . CAD Other     Social History   Socioeconomic History  . Marital status: Single    Spouse name: Not on file  . Number of children: Not on file  . Years of education: Not on file  . Highest education level: Not on file  Occupational History  . Not on file  Tobacco Use  . Smoking status: Current Some Day Smoker    Packs/day: 1.00    Years: 9.00    Pack years: 9.00    Types: Cigarettes  . Smokeless tobacco: Never Used  Vaping Use  . Vaping Use: Never used  Substance and Sexual Activity  .  Alcohol use: Yes    Alcohol/week: 0.0 standard drinks  . Drug use: Never  . Sexual activity: Yes    Birth control/protection: Injection  Other Topics Concern  . Not on file  Social History Narrative  . Not on file   Social Determinants of Health   Financial Resource Strain: Low Risk   . Difficulty of Paying Living Expenses: Not very hard  Food Insecurity: No Food Insecurity  . Worried About Charity fundraiser in the Last Year: Never true  . Ran Out of Food in the Last Year: Never true  Transportation Needs: No Transportation Needs  . Lack of Transportation (Medical): No  . Lack of Transportation (Non-Medical): No  Physical Activity:   . Days of Exercise per Week: Not on file  . Minutes of Exercise per Session: Not on file  Stress:    . Feeling of Stress : Not on file  Social Connections:   . Frequency of Communication with Friends and Family: Not on file  . Frequency of Social Gatherings with Friends and Family: Not on file  . Attends Religious Services: Not on file  . Active Member of Clubs or Organizations: Not on file  . Attends Archivist Meetings: Not on file  . Marital Status: Not on file  Intimate Partner Violence:   . Fear of Current or Ex-Partner: Not on file  . Emotionally Abused: Not on file  . Physically Abused: Not on file  . Sexually Abused: Not on file    Outpatient Medications Prior to Visit  Medication Sig Dispense Refill  . acetaminophen (TYLENOL) 500 MG tablet Take 1,500 mg every 6 (six) hours as needed by mouth for moderate pain or headache.    . albuterol (VENTOLIN HFA) 108 (90 Base) MCG/ACT inhaler Inhale 2 puffs into the lungs every 6 (six) hours as needed for wheezing or shortness of breath. 6.7 g 0  . ibuprofen (ADVIL) 200 MG tablet Take 200 mg by mouth every 8 (eight) hours as needed for cramping (taking 4, 200 mg tablets every 8 hours as needed).    . medroxyPROGESTERone Acetate 150 MG/ML SUSY INJECT 1 ML INTO THE MUSCLE EVERY 3 MONTHS 1 mL 3  . megestrol (MEGACE) 40 MG tablet Take 3 x 5 days then 2 x 5 days then 1 daily 45 tablet 1  . amoxicillin (AMOXIL) 500 MG capsule Take 1 capsule (500 mg total) by mouth 3 (three) times daily. (Patient not taking: Reported on 06/28/2020) 30 capsule 0  . cephALEXin (KEFLEX) 500 MG capsule Take 1 capsule (500 mg total) by mouth 4 (four) times daily. (Patient not taking: Reported on 06/28/2020) 20 capsule 0  . fluconazole (DIFLUCAN) 150 MG tablet Take 1 now and 1 in 3 days (Patient not taking: Reported on 06/28/2020) 2 tablet 1  . naproxen sodium (ANAPROX DS) 550 MG tablet Take 1 every 12 hours prn cramps (Patient not taking: Reported on 06/28/2020) 30 tablet 3  . predniSONE (DELTASONE) 20 MG tablet Take 3 po QD x 3d , then 2 po QD x 3d then 1 po QD  x 3d (Patient not taking: Reported on 06/28/2020) 18 tablet 0  . promethazine (PHENERGAN) 25 MG tablet Take 1 tablet (25 mg total) by mouth every 6 (six) hours as needed for nausea or vomiting. (Patient not taking: Reported on 06/28/2020) 30 tablet 1   No facility-administered medications prior to visit.    Allergies  Allergen Reactions  . Latex Swelling    ROS Review  of Systems  Constitutional: Negative for chills and fever.  HENT: Negative for congestion, sinus pressure, sinus pain and sore throat.   Eyes: Negative for pain and discharge.  Respiratory: Negative for cough and shortness of breath.   Cardiovascular: Negative for chest pain and palpitations.  Gastrointestinal: Negative for abdominal pain, constipation, diarrhea, nausea and vomiting.  Endocrine: Negative for polydipsia and polyuria.  Genitourinary: Negative for dysuria and hematuria.  Musculoskeletal: Negative for neck pain and neck stiffness.  Skin: Negative for rash.  Neurological: Positive for dizziness and headaches. Negative for speech difficulty and weakness.  Psychiatric/Behavioral: Negative for agitation and behavioral problems.      Objective:    Physical Exam Vitals reviewed.  Constitutional:      General: She is not in acute distress.    Appearance: She is not diaphoretic.  HENT:     Head: Normocephalic and atraumatic.     Nose: Nose normal. No congestion.     Mouth/Throat:     Mouth: Mucous membranes are moist.     Pharynx: No posterior oropharyngeal erythema.  Eyes:     General: No scleral icterus.    Extraocular Movements: Extraocular movements intact.     Pupils: Pupils are equal, round, and reactive to light.  Cardiovascular:     Rate and Rhythm: Normal rate and regular rhythm.     Pulses: Normal pulses.     Heart sounds: Normal heart sounds. No murmur heard.   Pulmonary:     Breath sounds: Normal breath sounds. No wheezing or rales.  Abdominal:     Palpations: Abdomen is soft.      Tenderness: There is no abdominal tenderness.  Musculoskeletal:     Cervical back: Neck supple. No tenderness.     Right lower leg: No edema.     Left lower leg: No edema.  Skin:    General: Skin is warm.     Findings: No rash.  Neurological:     General: No focal deficit present.     Mental Status: She is alert and oriented to person, place, and time.     Sensory: No sensory deficit.     Motor: No weakness.  Psychiatric:        Mood and Affect: Mood normal.        Behavior: Behavior normal.     BP 114/74 (BP Location: Right Arm, Patient Position: Sitting, Cuff Size: Normal)   Pulse 77   Temp 98.9 F (37.2 C) (Oral)   Resp 18   Ht '5\' 7"'  (1.702 m)   Wt 194 lb 1.9 oz (88.1 kg)   SpO2 98%   BMI 30.40 kg/m  Wt Readings from Last 3 Encounters:  07/03/20 194 lb 1.9 oz (88.1 kg)  06/09/20 210 lb (95.3 kg)  01/09/20 185 lb (83.9 kg)     Health Maintenance Due  Topic Date Due  . Hepatitis C Screening  Never done  . PAP SMEAR-Modifier  06/01/2020    There are no preventive care reminders to display for this patient.  Lab Results  Component Value Date   TSH 1.170 01/06/2013   Lab Results  Component Value Date   WBC 7.6 01/09/2020   HGB 12.5 01/09/2020   HCT 39.4 01/09/2020   MCV 87.6 01/09/2020   PLT 226 01/09/2020   Lab Results  Component Value Date   NA 135 01/09/2020   K 3.7 01/09/2020   CO2 22 01/09/2020   GLUCOSE 115 (H) 01/09/2020   BUN 13 01/09/2020  CREATININE 0.62 01/09/2020   BILITOT 0.5 01/09/2020   ALKPHOS 42 01/09/2020   AST 12 (L) 01/09/2020   ALT 13 01/09/2020   PROT 6.5 01/09/2020   ALBUMIN 3.3 (L) 01/09/2020   CALCIUM 8.5 (L) 01/09/2020   ANIONGAP 7 01/09/2020   No results found for: CHOL No results found for: HDL No results found for: LDLCALC No results found for: TRIG No results found for: CHOLHDL No results found for: HGBA1C    Assessment & Plan:   Problem List Items Addressed This Visit      Encounter to establish care -  Primary   Care established Previous chart reviewed History and medications reviewed with the patient     Relevant Orders  CBC with Differential  CMP14+EGFR  Lipid Profile  TSH  Vitamin D (25 hydroxy)   Cardiovascular and Mediastinum   Migraines    Takes Advil and Exedrin PRN About 5 episodes in a month Prescribed sumatriptan as needed Avoid excessive sunlight, noise or crowded places      Relevant Medications   SUMAtriptan (IMITREX) 50 MG tablet     Respiratory   Mild asthma without complication    Uses albuterol inhaler as needed        Other   Irregular intermenstrual bleeding    Get medroxyprogesterone injection every 3 months Follows up with OB/GYN Takes Megace as needed for heavy bleeding      Tobacco abuse    Asked about quitting: confirms that she currently smokes cigarettes Advise to quit smoking: Educated about QUITTING to reduce the risk of cancer, cardio and cerebrovascular disease. Assess willingness: Unwilling to quit at this time, but is working on cutting back. Assist with counseling and pharmacotherapy: Counseled for 5 minutes and literature provided. Arrange for follow up: Follow up and continue to offer help.      Need for immunization against influenza   Relevant Orders   Flu Vaccine QUAD 36+ mos IM (Completed)      Meds ordered this encounter  Medications  . SUMAtriptan (IMITREX) 50 MG tablet    Sig: Take 1 tablet (50 mg total) by mouth every 2 (two) hours as needed for migraine. May repeat in 2 hours if headache persists or recurs. Maximum of 2 doses.    Dispense:  10 tablet    Refill:  2    Follow-up: Return in about 6 months (around 12/31/2020).    Lindell Spar, MD

## 2020-07-03 NOTE — Assessment & Plan Note (Signed)
Takes Advil and Exedrin PRN About 5 episodes in a month Prescribed sumatriptan as needed Avoid excessive sunlight, noise or crowded places

## 2020-07-04 ENCOUNTER — Other Ambulatory Visit: Payer: Self-pay | Admitting: Internal Medicine

## 2020-07-04 DIAGNOSIS — E559 Vitamin D deficiency, unspecified: Secondary | ICD-10-CM

## 2020-07-04 LAB — CMP14+EGFR
ALT: 23 IU/L (ref 0–32)
AST: 13 IU/L (ref 0–40)
Albumin/Globulin Ratio: 1.8 (ref 1.2–2.2)
Albumin: 4.2 g/dL (ref 3.8–4.8)
Alkaline Phosphatase: 57 IU/L (ref 44–121)
BUN/Creatinine Ratio: 15 (ref 9–23)
BUN: 11 mg/dL (ref 6–20)
Bilirubin Total: 0.5 mg/dL (ref 0.0–1.2)
CO2: 22 mmol/L (ref 20–29)
Calcium: 9.3 mg/dL (ref 8.7–10.2)
Chloride: 105 mmol/L (ref 96–106)
Creatinine, Ser: 0.75 mg/dL (ref 0.57–1.00)
GFR calc Af Amer: 123 mL/min/{1.73_m2} (ref >59)
GFR calc non Af Amer: 107 mL/min/{1.73_m2} (ref >59)
Globulin, Total: 2.4 g/dL (ref 1.5–4.5)
Glucose: 84 mg/dL (ref 65–99)
Potassium: 4.2 mmol/L (ref 3.5–5.2)
Sodium: 139 mmol/L (ref 134–144)
Total Protein: 6.6 g/dL (ref 6.0–8.5)

## 2020-07-04 LAB — CBC WITH DIFFERENTIAL/PLATELET
Basophils Absolute: 0.1 10*3/uL (ref 0.0–0.2)
Basos: 1 %
EOS (ABSOLUTE): 0.8 10*3/uL — ABNORMAL HIGH (ref 0.0–0.4)
Eos: 12 %
Hematocrit: 40.9 % (ref 34.0–46.6)
Hemoglobin: 13.5 g/dL (ref 11.1–15.9)
Immature Grans (Abs): 0 10*3/uL (ref 0.0–0.1)
Immature Granulocytes: 1 %
Lymphocytes Absolute: 2.8 10*3/uL (ref 0.7–3.1)
Lymphs: 43 %
MCH: 27.6 pg (ref 26.6–33.0)
MCHC: 33 g/dL (ref 31.5–35.7)
MCV: 84 fL (ref 79–97)
Monocytes Absolute: 0.5 10*3/uL (ref 0.1–0.9)
Monocytes: 7 %
Neutrophils Absolute: 2.4 10*3/uL (ref 1.4–7.0)
Neutrophils: 36 %
Platelets: 222 10*3/uL (ref 150–450)
RBC: 4.9 x10E6/uL (ref 3.77–5.28)
RDW: 12.7 % (ref 11.7–15.4)
WBC: 6.6 10*3/uL (ref 3.4–10.8)

## 2020-07-04 LAB — LIPID PANEL
Chol/HDL Ratio: 4.4 ratio (ref 0.0–4.4)
Cholesterol, Total: 223 mg/dL — ABNORMAL HIGH (ref 100–199)
HDL: 51 mg/dL (ref >39)
LDL Chol Calc (NIH): 158 mg/dL — ABNORMAL HIGH (ref 0–99)
Triglycerides: 79 mg/dL (ref 0–149)
VLDL Cholesterol Cal: 14 mg/dL (ref 5–40)

## 2020-07-04 LAB — VITAMIN D 25 HYDROXY (VIT D DEFICIENCY, FRACTURES): Vit D, 25-Hydroxy: 11.9 ng/mL — ABNORMAL LOW (ref 30.0–100.0)

## 2020-07-04 LAB — TSH: TSH: 1.3 u[IU]/mL (ref 0.450–4.500)

## 2020-07-04 MED ORDER — VITAMIN D (ERGOCALCIFEROL) 1.25 MG (50000 UNIT) PO CAPS: 50000.0000 [IU] | ORAL_CAPSULE | ORAL | 1 refills | Status: DC

## 2020-07-11 ENCOUNTER — Telehealth: Payer: Medicaid Other | Admitting: Internal Medicine

## 2020-07-11 ENCOUNTER — Encounter: Payer: Self-pay | Admitting: Internal Medicine

## 2020-07-11 ENCOUNTER — Other Ambulatory Visit: Payer: Self-pay

## 2020-07-11 DIAGNOSIS — K649 Unspecified hemorrhoids: Secondary | ICD-10-CM | POA: Diagnosis not present

## 2020-07-11 DIAGNOSIS — K2970 Gastritis, unspecified, without bleeding: Secondary | ICD-10-CM

## 2020-07-11 DIAGNOSIS — E559 Vitamin D deficiency, unspecified: Secondary | ICD-10-CM

## 2020-07-11 MED ORDER — DOCUSATE SODIUM 100 MG PO CAPS: 100.0000 mg | ORAL_CAPSULE | Freq: Every day | ORAL | 2 refills | Status: DC | PRN

## 2020-07-11 MED ORDER — HYDROCORTISONE (PERIANAL) 2.5 % EX CREA: 1.0000 "application " | TOPICAL_CREAM | Freq: Two times a day (BID) | CUTANEOUS | 0 refills | Status: DC

## 2020-07-11 NOTE — Progress Notes (Signed)
Virtual Visit via Telephone Note   This visit type was conducted due to national recommendations for restrictions regarding the COVID-19 Pandemic (e.g. social distancing) in an effort to limit this patient's exposure and mitigate transmission in our community.  Due to her co-morbid illnesses, this patient is at least at moderate risk for complications without adequate follow up.  This format is felt to be most appropriate for this patient at this time.  The patient did not have access to video technology/had technical difficulties with video requiring transitioning to audio format only (telephone).  All issues noted in this document were discussed and addressed.  No physical exam could be performed with this format.  Evaluation Performed:  Follow-up visit  Date:  07/11/2020   ID:  Kaylee Miller, DOB Nov 01, 1990, MRN [id number]  Patient Location: Home Provider Location: Office/Clinic  Location of Patient: Home Location of Provider: Telehealth Consent was obtain for visit to be over via telehealth. I verified that I am speaking with the correct person using two identifiers.  PCP:  Lindell Spar, MD   Chief Complaint: Rectal pain and Loose BM  History of Present Illness:    Kaylee Miller is a 29 y.o. female with PMH of migraine, irregular menstrual cycles, and tobacco abuse has a televisit for c/o rectal pain and loose BM for last 2 days. Patient had chicken at home the day before, denies taking any hot or spicy food. She denies any melena or hematochezia. She denies any fever, chills, nausea, or vomiting. She mentions that she started taking Vitamin D tablets last weekend and wanted to check whether it was because of the medication. Of note, patient mentions h/o hemorrhoids and chronic constipation.  The patient does not have symptoms concerning for COVID-19 infection (fever, chills, cough, or new shortness of breath).   Past Medical, Surgical, Social History, Allergies, and  Medications have been Reviewed.  Past Medical History:  Diagnosis Date  . Asthma    Phreesia 07/02/2020  . Contraceptive management 02/08/2015  . Headache(784.0)    migraines  . History of bronchitis   . HSV-2 (herpes simplex virus 2) infection   . Pregnant   . S/P C-section 07/28/2013  . Seasonal allergies    Past Surgical History:  Procedure Laterality Date  . adenoids    . CESAREAN SECTION  2007  . CESAREAN SECTION N/A 07/28/2013   Procedure: CESAREAN SECTION;  Surgeon: Jonnie Kind, MD;  Location: [facility];  Service: Obstetrics;  Laterality: N/A;  . tubes in ears       Current Meds  Medication Sig  . acetaminophen (TYLENOL) 500 MG tablet Take 1,500 mg every 6 (six) hours as needed by mouth for moderate pain or headache.  . albuterol (VENTOLIN HFA) 108 (90 Base) MCG/ACT inhaler Inhale 2 puffs into the lungs every 6 (six) hours as needed for wheezing or shortness of breath.  Marland Kitchen ibuprofen (ADVIL) 200 MG tablet Take 200 mg by mouth every 8 (eight) hours as needed for cramping (taking 4, 200 mg tablets every 8 hours as needed).  . medroxyPROGESTERone Acetate 150 MG/ML SUSY INJECT 1 ML INTO THE MUSCLE EVERY 3 MONTHS  . megestrol (MEGACE) 40 MG tablet Take 3 x 5 days then 2 x 5 days then 1 daily  . SUMAtriptan (IMITREX) 50 MG tablet Take 1 tablet (50 mg total) by mouth every 2 (two) hours as needed for migraine. May repeat in 2 hours if headache persists or recurs. Maximum of 2  doses.  . Vitamin D, Ergocalciferol, (DRISDOL) 1.25 MG (50000 UNIT) CAPS capsule Take 1 capsule (50,000 Units total) by mouth every 7 (seven) days.     Allergies:   Latex   ROS:   Please see the history of present illness.    Review of Systems  Constitutional: Negative for chills and fever.  HENT: Negative for congestion, sinus pain and sore throat.   Eyes: Negative for pain and redness.  Respiratory: Negative for cough and shortness of breath.   Cardiovascular: Negative for chest pain and palpitations.    Gastrointestinal: Positive for constipation (chronic) and heartburn. Negative for blood in stool, melena, nausea and vomiting.  Genitourinary: Negative for dysuria and hematuria.  Musculoskeletal: Negative for neck pain.  Skin: Negative for rash.  Neurological: Negative for dizziness, sensory change, speech change, focal weakness, seizures and headaches.  Psychiatric/Behavioral: Negative for depression and suicidal ideas.      Labs/Other Tests and Data Reviewed:    Recent Labs: 07/03/2020: ALT 23; BUN 11; Creatinine, Ser 0.75; Hemoglobin 13.5; Platelets 222; Potassium 4.2; Sodium 139; TSH 1.300   Recent Lipid Panel Lab Results  Component Value Date/Time   CHOL 223 (H) 07/03/2020 10:58 AM   TRIG 79 07/03/2020 10:58 AM   HDL 51 07/03/2020 10:58 AM   CHOLHDL 4.4 07/03/2020 10:58 AM   LDLCALC 158 (H) 07/03/2020 10:58 AM    Wt Readings from Last 3 Encounters:  07/03/20 194 lb 1.9 oz (88.1 kg)  06/09/20 210 lb (95.3 kg)  01/09/20 185 lb (83.9 kg)      ASSESSMENT & PLAN:    Gastritis C/o heartburn Advised to take Prilosec PRN Avoid hot, spicy or fried food  Loose BM Could be viral vs bacterial gastroenteritis Advised to stay hydrated No fever, chills, advised to avoid anti-diarrheal agents  Hemorrhoids, likely external C/o rectal pain, no bleeding Advised to apply Anusol locally Increase water intake to at least 2 l in a day  Chronic constipation Docusate PRN, avoid currently until loose BM resolves    Time:   Today, I have spent 12 minutes with the patient with telehealth technology discussing the above problems.     Medication Adjustments/Labs and Tests Ordered: Current medicines are reviewed at length with the patient today.  Concerns regarding medicines are outlined above.   Tests Ordered: No orders of the defined types were placed in this encounter.   Medication Changes: Meds ordered this encounter  Medications  . hydrocortisone (ANUSOL-HC) 2.5 %  rectal cream    Sig: Place 1 application rectally 2 (two) times daily.    Dispense:  30 g    Refill:  0  . docusate sodium (COLACE) 100 MG capsule    Sig: Take 1 capsule (100 mg total) by mouth daily as needed for mild constipation.    Dispense:  30 capsule    Refill:  2     Note: This dictation was prepared with Dragon dictation along with smaller phrase technology. Similar sounding words can be transcribed inadequately or may not be corrected upon review. Any transcriptional errors that result from this process are unintentional.      Disposition:  Follow up  Signed, Lindell Spar, MD  07/11/2020 9:05 AM     [facility]

## 2020-07-11 NOTE — Patient Instructions (Signed)
Please apply Anusol cream about twice daily for external hemorrhoids.  Please take Pepcid or Prilosec as needed for burning sensation in the stomach/GERD.  Please take Docusate as needed for constipation. Do not take it currently when you have loose bowel movements.  Please avoid hot, spicy or fried food.  Please take at least 2-2.5 l of water in a day.

## 2020-07-16 ENCOUNTER — Other Ambulatory Visit: Payer: Self-pay

## 2020-07-16 NOTE — Patient Outreach (Addendum)
Care Coordination- Social Work  07/16/2020  Kaylee Miller 08/29/1990 [id number]  Subjective:    Kaylee Miller is an 29 y.o. year old female who is a primary patient of Kaylee Spar, MD.    Kaylee Miller was given information about Medicaid Managed Care team care coordination services today. Kaylee Miller agreed to services and verbal consent obtained  Review of patient status, laboratory and other test data was performed as part of evaluation for provision of services.  SDOH:   SDOH Screenings   Alcohol Screen:   . Last Alcohol Screening Score (AUDIT): Not on file  Depression (PHQ2-9): Low Risk   . PHQ-2 Score: 0  Financial Resource Strain: Low Risk   . Difficulty of Paying Living Expenses: Not very hard  Food Insecurity: No Food Insecurity  . Worried About Charity fundraiser in the Last Year: Never true  . Ran Out of Food in the Last Year: Never true  Housing: Low Risk   . Last Housing Risk Score: 0  Physical Activity:   . Days of Exercise per Week: Not on file  . Minutes of Exercise per Session: Not on file  Social Connections:   . Frequency of Communication with Friends and Family: Not on file  . Frequency of Social Gatherings with Friends and Family: Not on file  . Attends Religious Services: Not on file  . Active Member of Clubs or Organizations: Not on file  . Attends Archivist Meetings: Not on file  . Marital Status: Not on file  Stress:   . Feeling of Stress : Not on file  Tobacco Use: High Risk  . Smoking Tobacco Use: Current Some Day Smoker  . Smokeless Tobacco Use: Never Used  Transportation Needs: No Transportation Needs  . Lack of Transportation (Medical): No  . Lack of Transportation (Non-Medical): No     Objective:    Medications:  Medications Reviewed Today    Reviewed by Shelda Altes, CMA (Certified Medical Assistant) on 07/11/20 at [phone]  Med List Status: <None>  Medication Order Taking? Sig Documenting Provider  Last Dose Status Informant  acetaminophen (TYLENOL) 500 MG tablet [id number]  Take 1,500 mg every 6 (six) hours as needed by mouth for moderate pain or headache. [provider]  Active Self  albuterol (VENTOLIN HFA) 108 (90 Base) MCG/ACT inhaler [id number]  Inhale 2 puffs into the lungs every 6 (six) hours as needed for wheezing or shortness of breath. Rolland Porter, MD  Active   ibuprofen (ADVIL) 200 MG tablet [id number]  Take 200 mg by mouth every 8 (eight) hours as needed for cramping (taking 4, 200 mg tablets every 8 hours as needed). [provider]  Active   medroxyPROGESTERone Acetate 150 MG/ML SUSY [id number]  INJECT 1 ML INTO THE MUSCLE EVERY 3 MONTHS Derrek Monaco A, NP  Active   megestrol (MEGACE) 40 MG tablet [id number]  Take 3 x 5 days then 2 x 5 days then 1 daily Derrek Monaco A, NP  Active   SUMAtriptan (IMITREX) 50 MG tablet [id number]  Take 1 tablet (50 mg total) by mouth every 2 (two) hours as needed for migraine. May repeat in 2 hours if headache persists or recurs. Maximum of 2 doses. Kaylee Spar, MD  Active   Vitamin D, Ergocalciferol, (DRISDOL) 1.25 MG (50000 UNIT) CAPS capsule [id number]  Take 1 capsule (50,000 Units total) by mouth every 7 (seven) days. Kaylee Spar, MD  Active  Fall/Depression Screening:  Fall Risk  07/11/2020 07/03/2020 08/05/2019  Falls in the past year? 0 0 0  Number falls in past yr: 0 0 -  Injury with Fall? 0 0 -  Risk for fall due to : No Fall Risks No Fall Risks -  Follow up Falls evaluation completed Falls evaluation completed -   PHQ 2/9 Scores 07/11/2020 07/03/2020 07/03/2020 08/26/2018 08/26/2018 06/01/2017  PHQ - 2 Score 0 1 1 4 4 4   PHQ- 9 Score - 2 - 18 - 6    Assessment: Patient states she contact resources given to her, resources can only help with individuals that are behind on rent and utilities. Patient is currently up to date on all.   Plan: Patient will continue to work and manage her finances as she  can.

## 2020-07-16 NOTE — Patient Instructions (Signed)
Visit Information  Kaylee Miller was given information about Medicaid Managed Care team care coordination services as a part of their [employer] Medicaid benefit. [name] verbally consented to engagement with the [facility] Managed Care team.   For questions related to your Healthy [facility] health plan, please call: [phone] or visit the homepage here: [email or web address]  If you would like to schedule transportation through your Healthy [facility] plan, please call the following number at least 2 days in advance of your appointment: [phone]    The patient has been provided with contact information for the Managed Medicaid care management team and has been advised to call with any health related questions or concerns.   [name], BSW, [place]  High Risk Managed Medicaid Team

## 2020-08-30 ENCOUNTER — Encounter: Payer: Self-pay | Admitting: Nurse Practitioner

## 2020-08-30 ENCOUNTER — Ambulatory Visit (INDEPENDENT_AMBULATORY_CARE_PROVIDER_SITE_OTHER): Payer: Medicaid Other | Admitting: Nurse Practitioner

## 2020-08-30 ENCOUNTER — Other Ambulatory Visit: Payer: Self-pay

## 2020-08-30 DIAGNOSIS — M5432 Sciatica, left side: Secondary | ICD-10-CM

## 2020-08-30 DIAGNOSIS — M5430 Sciatica, unspecified side: Secondary | ICD-10-CM | POA: Insufficient documentation

## 2020-08-30 DIAGNOSIS — M5431 Sciatica, right side: Secondary | ICD-10-CM | POA: Diagnosis not present

## 2020-08-30 MED ORDER — PREDNISONE 10 MG PO TABS: ORAL_TABLET | ORAL | 0 refills | Status: AC

## 2020-08-30 MED ORDER — TIZANIDINE HCL 4 MG PO TABS: 4.0000 mg | ORAL_TABLET | Freq: Four times a day (QID) | ORAL | 0 refills | Status: DC | PRN

## 2020-08-30 MED ORDER — KETOROLAC TROMETHAMINE 10 MG PO TABS
10.0000 mg | ORAL_TABLET | Freq: Three times a day (TID) | ORAL | 0 refills | Status: DC | PRN
Start: 2020-08-30 — End: 2020-09-20

## 2020-08-30 MED ORDER — KETOROLAC TROMETHAMINE 60 MG/2ML IM SOLN
30.0000 mg | Freq: Once | INTRAMUSCULAR | Status: AC
  Administered 2020-08-30: 30 mg via INTRAMUSCULAR

## 2020-08-30 NOTE — Assessment & Plan Note (Addendum)
-  started 2 weeks ago and is getting progressively worse -Rx. toradol -Rx. Prednisone -Rx. Tizanidine -positive straight leg raise bilaterally -IM toradol today -if no improvement, consider ortho

## 2020-08-30 NOTE — Progress Notes (Signed)
Acute Office Visit  Subjective:    Patient ID: Kaylee Miller, female    DOB: Jan 06, 1991, 30 y.o.   MRN: [id number]  Chief Complaint  Patient presents with  . Back Pain    X 2 weeks     HPI Patient is in today for back pain.  She states she is unable to sit d/t her pain.  She has tried taking ibuprofen and she had to work early from work d/t her pain. She states the pain radiates down both legs.  May have started after lifting a box, however, she did not start hurting immediately after completing the lift.  Past Medical History:  Diagnosis Date  . Asthma    Phreesia 07/02/2020  . Contraceptive management 02/08/2015  . Headache(784.0)    migraines  . History of bronchitis   . HSV-2 (herpes simplex virus 2) infection   . Pregnant   . S/P C-section 07/28/2013  . Seasonal allergies     Past Surgical History:  Procedure Laterality Date  . adenoids    . CESAREAN SECTION  2007  . CESAREAN SECTION N/A 07/28/2013   Procedure: CESAREAN SECTION;  Surgeon: Jonnie Kind, MD;  Location: [facility];  Service: Obstetrics;  Laterality: N/A;  . tubes in ears      Family History  Problem Relation Age of Onset  . Cancer Mother   . Hypertension Father   . Stroke Father   . CAD Maternal Grandfather   . CAD Paternal Grandmother   . CAD Paternal Grandfather   . Diabetes Other        great-great grandmother  . CAD Other     Social History   Socioeconomic History  . Marital status: Single    Spouse name: Not on file  . Number of children: Not on file  . Years of education: Not on file  . Highest education level: Not on file  Occupational History  . Not on file  Tobacco Use  . Smoking status: Current Some Day Smoker    Packs/day: 1.00    Years: 9.00    Pack years: 9.00    Types: Cigarettes  . Smokeless tobacco: Never Used  Vaping Use  . Vaping Use: Never used  Substance and Sexual Activity  . Alcohol use: Yes    Alcohol/week: 0.0 standard drinks  . Drug use: Never  .  Sexual activity: Yes    Birth control/protection: Injection  Other Topics Concern  . Not on file  Social History Narrative  . Not on file   Social Determinants of Health   Financial Resource Strain: Low Risk   . Difficulty of Paying Living Expenses: Not very hard  Food Insecurity: No Food Insecurity  . Worried About Charity fundraiser in the Last Year: Never true  . Ran Out of Food in the Last Year: Never true  Transportation Needs: No Transportation Needs  . Lack of Transportation (Medical): No  . Lack of Transportation (Non-Medical): No  Physical Activity: Not on file  Stress: Not on file  Social Connections: Not on file  Intimate Partner Violence: Not on file    Outpatient Medications Prior to Visit  Medication Sig Dispense Refill  . acetaminophen (TYLENOL) 500 MG tablet Take 1,500 mg every 6 (six) hours as needed by mouth for moderate pain or headache.    . albuterol (VENTOLIN HFA) 108 (90 Base) MCG/ACT inhaler Inhale 2 puffs into the lungs every 6 (six) hours as needed for wheezing or shortness  of breath. 6.7 g 0  . docusate sodium (COLACE) 100 MG capsule Take 1 capsule (100 mg total) by mouth daily as needed for mild constipation. 30 capsule 2  . hydrocortisone (ANUSOL-HC) 2.5 % rectal cream Place 1 application rectally 2 (two) times daily. 30 g 0  . ibuprofen (ADVIL) 200 MG tablet Take 200 mg by mouth every 8 (eight) hours as needed for cramping (taking 4, 200 mg tablets every 8 hours as needed).    . medroxyPROGESTERone Acetate 150 MG/ML SUSY INJECT 1 ML INTO THE MUSCLE EVERY 3 MONTHS 1 mL 3  . megestrol (MEGACE) 40 MG tablet Take 3 x 5 days then 2 x 5 days then 1 daily 45 tablet 1  . SUMAtriptan (IMITREX) 50 MG tablet Take 1 tablet (50 mg total) by mouth every 2 (two) hours as needed for migraine. May repeat in 2 hours if headache persists or recurs. Maximum of 2 doses. 10 tablet 2  . Vitamin D, Ergocalciferol, (DRISDOL) 1.25 MG (50000 UNIT) CAPS capsule Take 1 capsule  (50,000 Units total) by mouth every 7 (seven) days. 12 capsule 1  . ketorolac (TORADOL) 10 MG tablet Take 10 mg by mouth 3 (three) times daily as needed.     No facility-administered medications prior to visit.    Allergies  Allergen Reactions  . Latex Swelling    Review of Systems  Constitutional: Negative.   Respiratory: Negative.   Cardiovascular: Negative.   Musculoskeletal: Positive for back pain.       Per HPI       Objective:    Physical Exam Constitutional:      Appearance: Normal appearance.  Cardiovascular:     Rate and Rhythm: Normal rate and regular rhythm.     Pulses: Normal pulses.     Heart sounds: Normal heart sounds.  Pulmonary:     Effort: Pulmonary effort is normal.     Breath sounds: Normal breath sounds.  Musculoskeletal:        General: Tenderness present.     Comments: Tenderness to palpation over spine and paraspinal muscles to bilateral lower back; positive straight leg raise bilaterally  Neurological:     Mental Status: She is alert.     BP 124/65   Pulse (!) 106   Temp 99.9 F (37.7 C)   Resp 20   Ht 5\' 7"  (1.702 m)   Wt 193 lb (87.5 kg)   SpO2 98%   BMI 30.23 kg/m  Wt Readings from Last 3 Encounters:  08/30/20 193 lb (87.5 kg)  07/03/20 194 lb 1.9 oz (88.1 kg)  06/09/20 210 lb (95.3 kg)    Health Maintenance Due  Topic Date Due  . Hepatitis C Screening  Never done  . PAP SMEAR-Modifier  06/01/2020    There are no preventive care reminders to display for this patient.   Lab Results  Component Value Date   TSH 1.300 07/03/2020   Lab Results  Component Value Date   WBC 6.6 07/03/2020   HGB 13.5 07/03/2020   HCT 40.9 07/03/2020   MCV 84 07/03/2020   PLT 222 07/03/2020   Lab Results  Component Value Date   NA 139 07/03/2020   K 4.2 07/03/2020   CO2 22 07/03/2020   GLUCOSE 84 07/03/2020   BUN 11 07/03/2020   CREATININE 0.75 07/03/2020   BILITOT 0.5 07/03/2020   ALKPHOS 57 07/03/2020   AST 13 07/03/2020   ALT  23 07/03/2020   PROT 6.6 07/03/2020  ALBUMIN 4.2 07/03/2020   CALCIUM 9.3 07/03/2020   ANIONGAP 7 01/09/2020   Lab Results  Component Value Date   CHOL 223 (H) 07/03/2020   Lab Results  Component Value Date   HDL 51 07/03/2020   Lab Results  Component Value Date   LDLCALC 158 (H) 07/03/2020   Lab Results  Component Value Date   TRIG 79 07/03/2020   Lab Results  Component Value Date   CHOLHDL 4.4 07/03/2020   No results found for: HGBA1C     Assessment & Plan:   Problem List Items Addressed This Visit      Nervous and Auditory   Sciatica    -started 2 weeks ago and is getting progressively worse -Rx. toradol -Rx. Prednisone -Rx. Tizanidine -positive straight leg raise bilaterally -IM toradol today -if no improvement, consider ortho      Relevant Medications   tiZANidine (ZANAFLEX) 4 MG tablet       Meds ordered this encounter  Medications  . ketorolac (TORADOL) 10 MG tablet    Sig: Take 1 tablet (10 mg total) by mouth 3 (three) times daily as needed. Do NOT take other NSAIDs with this (like ibuprofen, motril, advil, aleve, or naproxen)    Dispense:  15 tablet    Refill:  0  . tiZANidine (ZANAFLEX) 4 MG tablet    Sig: Take 1 tablet (4 mg total) by mouth every 6 (six) hours as needed for muscle spasms.    Dispense:  30 tablet    Refill:  0  . predniSONE (DELTASONE) 10 MG tablet    Sig: Take 6 tablets (60 mg total) by mouth daily with breakfast for 1 day, THEN 5 tablets (50 mg total) daily with breakfast for 1 day, THEN 4 tablets (40 mg total) daily with breakfast for 1 day, THEN 3 tablets (30 mg total) daily with breakfast for 1 day, THEN 2 tablets (20 mg total) daily with breakfast for 1 day, THEN 1 tablet (10 mg total) daily with breakfast for 1 day.    Dispense:  21 tablet    Refill:  0     Heather Roberts, NP

## 2020-08-30 NOTE — Patient Instructions (Signed)
I called in a muscle relaxer (tizanidine) as well as two anti-inflammatories (prednisone and ketorolac) for oyur back pain. If you don't feel better in 1 week, let me know and we will consider an orthopedic consult.

## 2020-08-31 ENCOUNTER — Ambulatory Visit
Admission: EM | Admit: 2020-08-31 | Discharge: 2020-08-31 | Disposition: A | Discharge status: Home / Self Care | Payer: Medicaid Other | Attending: Family Medicine | Admitting: Family Medicine

## 2020-08-31 ENCOUNTER — Telehealth: Payer: Self-pay

## 2020-08-31 ENCOUNTER — Encounter: Payer: Self-pay | Admitting: Emergency Medicine

## 2020-08-31 DIAGNOSIS — R509 Fever, unspecified: Secondary | ICD-10-CM | POA: Diagnosis not present

## 2020-08-31 DIAGNOSIS — B349 Viral infection, unspecified: Secondary | ICD-10-CM | POA: Diagnosis not present

## 2020-08-31 DIAGNOSIS — R519 Headache, unspecified: Secondary | ICD-10-CM

## 2020-08-31 DIAGNOSIS — R6883 Chills (without fever): Secondary | ICD-10-CM

## 2020-08-31 MED ORDER — ACETAMINOPHEN 325 MG PO TABS
650.0000 mg | ORAL_TABLET | Freq: Once | ORAL | Status: AC
  Administered 2020-08-31: 650 mg via ORAL

## 2020-08-31 NOTE — Discharge Instructions (Addendum)
Your COVID and Flu tests are pending.  You should self quarantine until the test results are back.    Take Tylenol or ibuprofen as needed for fever or discomfort.  Rest and keep yourself hydrated.    Follow-up with your primary care provider if your symptoms are not improving.     

## 2020-08-31 NOTE — ED Triage Notes (Signed)
Pt woke up with a headache this morning also has had some chills

## 2020-08-31 NOTE — Telephone Encounter (Signed)
Patient was seen yesterday for back pain and states she thinks the medication she was given has caused her to get a headache she wants to speak with someone p# [phone]

## 2020-08-31 NOTE — ED Provider Notes (Signed)
[place]   [id number] 08/31/20 Arrival Time: 54   CC: COVID symptoms  SUBJECTIVE: History from: patient.  Kaylee Miller is a 30 y.o. female who presents with fever, headache, and chills since this morning. Denies sick exposure to COVID, flu or strep. Denies recent travel. Has negative history of Covid. Has completed Covid vaccines. Has completed flu vaccine this year. Has not taken OTC medications for this. There are no aggravating or alleviating factors. Denies previous symptoms in the past. Denies sinus pain, sore throat, SOB, wheezing, chest pain, nausea, changes in bowel or bladder habits.    ROS: As per HPI.  All other pertinent ROS negative.     Past Medical History:  Diagnosis Date  . Asthma    Phreesia 07/02/2020  . Contraceptive management 02/08/2015  . Headache(784.0)    migraines  . History of bronchitis   . HSV-2 (herpes simplex virus 2) infection   . Pregnant   . S/P C-section 07/28/2013  . Seasonal allergies    Past Surgical History:  Procedure Laterality Date  . adenoids    . CESAREAN SECTION  2007  . CESAREAN SECTION N/A 07/28/2013   Procedure: CESAREAN SECTION;  Surgeon: Jonnie Kind, MD;  Location: [facility];  Service: Obstetrics;  Laterality: N/A;  . tubes in ears     Allergies  Allergen Reactions  . Latex Swelling   No current facility-administered medications on file prior to encounter.   Current Outpatient Medications on File Prior to Encounter  Medication Sig Dispense Refill  . acetaminophen (TYLENOL) 500 MG tablet Take 1,500 mg every 6 (six) hours as needed by mouth for moderate pain or headache.    . albuterol (VENTOLIN HFA) 108 (90 Base) MCG/ACT inhaler Inhale 2 puffs into the lungs every 6 (six) hours as needed for wheezing or shortness of breath. 6.7 g 0  . docusate sodium (COLACE) 100 MG capsule Take 1 capsule (100 mg total) by mouth daily as needed for mild constipation. 30 capsule 2  . hydrocortisone (ANUSOL-HC) 2.5 %  rectal cream Place 1 application rectally 2 (two) times daily. 30 g 0  . ibuprofen (ADVIL) 200 MG tablet Take 200 mg by mouth every 8 (eight) hours as needed for cramping (taking 4, 200 mg tablets every 8 hours as needed).    Marland Kitchen ketorolac (TORADOL) 10 MG tablet Take 1 tablet (10 mg total) by mouth 3 (three) times daily as needed. Do NOT take other NSAIDs with this (like ibuprofen, motril, advil, aleve, or naproxen) 15 tablet 0  . medroxyPROGESTERone Acetate 150 MG/ML SUSY INJECT 1 ML INTO THE MUSCLE EVERY 3 MONTHS 1 mL 3  . megestrol (MEGACE) 40 MG tablet Take 3 x 5 days then 2 x 5 days then 1 daily 45 tablet 1  . predniSONE (DELTASONE) 10 MG tablet Take 6 tablets (60 mg total) by mouth daily with breakfast for 1 day, THEN 5 tablets (50 mg total) daily with breakfast for 1 day, THEN 4 tablets (40 mg total) daily with breakfast for 1 day, THEN 3 tablets (30 mg total) daily with breakfast for 1 day, THEN 2 tablets (20 mg total) daily with breakfast for 1 day, THEN 1 tablet (10 mg total) daily with breakfast for 1 day. 21 tablet 0  . SUMAtriptan (IMITREX) 50 MG tablet Take 1 tablet (50 mg total) by mouth every 2 (two) hours as needed for migraine. May repeat in 2 hours if headache persists or recurs. Maximum of 2 doses. 10 tablet 2  .  tiZANidine (ZANAFLEX) 4 MG tablet Take 1 tablet (4 mg total) by mouth every 6 (six) hours as needed for muscle spasms. 30 tablet 0  . Vitamin D, Ergocalciferol, (DRISDOL) 1.25 MG (50000 UNIT) CAPS capsule Take 1 capsule (50,000 Units total) by mouth every 7 (seven) days. 12 capsule 1   Social History   Socioeconomic History  . Marital status: Single    Spouse name: Not on file  . Number of children: Not on file  . Years of education: Not on file  . Highest education level: Not on file  Occupational History  . Not on file  Tobacco Use  . Smoking status: Current Some Day Smoker    Packs/day: 1.00    Years: 9.00    Pack years: 9.00    Types: Cigarettes  . Smokeless  tobacco: Never Used  Vaping Use  . Vaping Use: Never used  Substance and Sexual Activity  . Alcohol use: Yes    Alcohol/week: 0.0 standard drinks  . Drug use: Never  . Sexual activity: Yes    Birth control/protection: Injection  Other Topics Concern  . Not on file  Social History Narrative  . Not on file   Social Determinants of Health   Financial Resource Strain: Low Risk   . Difficulty of Paying Living Expenses: Not very hard  Food Insecurity: No Food Insecurity  . Worried About Charity fundraiser in the Last Year: Never true  . Ran Out of Food in the Last Year: Never true  Transportation Needs: No Transportation Needs  . Lack of Transportation (Medical): No  . Lack of Transportation (Non-Medical): No  Physical Activity: Not on file  Stress: Not on file  Social Connections: Not on file  Intimate Partner Violence: Not on file   Family History  Problem Relation Age of Onset  . Cancer Mother   . Hypertension Father   . Stroke Father   . CAD Maternal Grandfather   . CAD Paternal Grandmother   . CAD Paternal Grandfather   . Diabetes Other        great-great grandmother  . CAD Other     OBJECTIVE:  Vitals:   08/31/20 1025 08/31/20 1026  BP:  116/70  Pulse:  94  Resp:  18  Temp:  (!) 101.1 F (38.4 C)  TempSrc:  Oral  SpO2:  98%  Weight: 191 lb 12.8 oz (87 kg)   Height: 5\' 7"  (1.702 m)      General appearance: alert; appears fatigued, but nontoxic; speaking in full sentences and tolerating own secretions HEENT: NCAT; Ears: EACs clear, TMs pearly gray; Eyes: PERRL.  EOM grossly intact. Sinuses: nontender; Nose: nares patent with clear rhinorrhea, Throat: oropharynx erythematous, cobblestoning present, tonsils non erythematous or enlarged, uvula midline  Neck: supple without LAD Lungs: unlabored respirations, symmetrical air entry; cough: absent; no respiratory distress; CTAB Heart: regular rate and rhythm.  Radial pulses 2+ symmetrical bilaterally Skin: warm  and dry Psychological: alert and cooperative; normal mood and affect  LABS:  No results found for this or any previous visit (from the past 24 hour(s)).   ASSESSMENT & PLAN:  1. Viral illness   2. Fever, unspecified   3. Nonintractable headache, unspecified chronicity pattern, unspecified headache type   4. Chills     Meds ordered this encounter  Medications  . acetaminophen (TYLENOL) tablet 650 mg   Tylenol given for fever in office Continue supportive care at home COVID and flu testing ordered.  It will take  between 2-3 days for test results. Someone will contact you regarding abnormal results.   Work note provided Patient should remain in quarantine until they have received Covid results.  If negative you may resume normal activities (go back to work/school) while practicing hand hygiene, social distance, and mask wearing.  If positive, patient should remain in quarantine for at least 5 days from symptom onset AND greater than 72 hours after symptoms resolution (absence of fever without the use of fever-reducing medication and improvement in respiratory symptoms), whichever is longer Get plenty of rest and push fluids Use OTC zyrtec for nasal congestion, runny nose, and/or sore throat Use OTC flonase for nasal congestion and runny nose Use medications daily for symptom relief Use OTC medications like ibuprofen or tylenol as needed fever or pain Call or go to the ED if you have any new or worsening symptoms such as fever, worsening cough, shortness of breath, chest tightness, chest pain, turning blue, changes in mental status.  Reviewed expectations re: course of current medical issues. Questions answered. Outlined signs and symptoms indicating need for more acute intervention. Patient verbalized understanding. After Visit Summary given.         Faustino Congress, NP 08/31/20 1046

## 2020-08-31 NOTE — Telephone Encounter (Signed)
I spoke with pt, she took an at home Covid test and it was positive. Due to new onset headache and raised temperature yesterday in office pt advised to quarantine, stay well hydrated and use otc medications for the sx's and call if needed.

## 2020-09-03 ENCOUNTER — Telehealth: Payer: Self-pay

## 2020-09-03 NOTE — Telephone Encounter (Signed)
Pt called and advised pt that results are not back. Pt was given revised CDC quarantine and read to pt.  [facility]at [place] on 08/31/20.

## 2020-09-06 LAB — COVID-19, FLU A+B NAA
Influenza A, NAA: NOT DETECTED
Influenza B, NAA: NOT DETECTED
SARS-CoV-2, NAA: DETECTED — AB

## 2020-09-17 ENCOUNTER — Other Ambulatory Visit: Payer: Self-pay | Admitting: Adult Health

## 2020-09-17 ENCOUNTER — Telehealth: Payer: Self-pay | Admitting: Adult Health

## 2020-09-17 NOTE — Telephone Encounter (Signed)
Pt has appt here 9:30am to get depo shot, can we make sure the refill request we received gets sent to [facility] today so she can pick up & bring to appointment tomorrow

## 2020-09-18 ENCOUNTER — Other Ambulatory Visit (HOSPITAL_COMMUNITY)
Admission: RE | Admit: 2020-09-18 | Discharge: 2020-09-18 | Disposition: A | Discharge status: Home / Self Care | Payer: Medicaid Other | Source: Ambulatory Visit | Attending: Adult Health | Admitting: Adult Health

## 2020-09-18 ENCOUNTER — Ambulatory Visit (INDEPENDENT_AMBULATORY_CARE_PROVIDER_SITE_OTHER): Payer: Medicaid Other | Admitting: Adult Health

## 2020-09-18 ENCOUNTER — Encounter: Payer: Self-pay | Admitting: Adult Health

## 2020-09-18 ENCOUNTER — Other Ambulatory Visit: Payer: Self-pay

## 2020-09-18 VITALS — BP 115/76 | HR 85 | Ht 67.0 in | Wt 191.0 lb

## 2020-09-18 DIAGNOSIS — Z0000 Encounter for general adult medical examination without abnormal findings: Secondary | ICD-10-CM | POA: Diagnosis not present

## 2020-09-18 DIAGNOSIS — Z3042 Encounter for surveillance of injectable contraceptive: Secondary | ICD-10-CM | POA: Diagnosis not present

## 2020-09-18 DIAGNOSIS — Z308 Encounter for other contraceptive management: Secondary | ICD-10-CM

## 2020-09-18 DIAGNOSIS — Z01419 Encounter for gynecological examination (general) (routine) without abnormal findings: Secondary | ICD-10-CM | POA: Diagnosis not present

## 2020-09-18 DIAGNOSIS — F17200 Nicotine dependence, unspecified, uncomplicated: Secondary | ICD-10-CM

## 2020-09-18 MED ORDER — MEDROXYPROGESTERONE ACETATE 150 MG/ML IM SUSP
150.0000 mg | Freq: Once | INTRAMUSCULAR | Status: AC
  Administered 2020-09-18: 150 mg via INTRAMUSCULAR

## 2020-09-18 NOTE — Progress Notes (Signed)
Patient ID: Kaylee Miller, female   DOB: Nov 07, 1990, 30 y.o.   MRN: [id number] History of Present Illness: Kaylee Miller is a 30 year old black female,single, G2P2 in for a well woman gyn exam and pap. PCP is Dr Posey Pronto.   Current Medications, Allergies, Past Medical History, Past Surgical History, Family History and Social History were reviewed in [employer] record.     Review of Systems: Patient denies any headaches, hearing loss, fatigue, blurred vision, shortness of breath, chest pain, abdominal pain, problems with bowel movements, urination, or intercourse. No joint pain or mood swings. Periods longer and can be heavy at times +stress, has sick kids     Physical Exam:BP 115/76 (BP Location: Left Arm, Patient Position: Sitting, Cuff Size: Normal)   Pulse 85   Ht 5\' 7"  (1.702 m)   Wt 191 lb (86.6 kg)   BMI 29.91 kg/m  General:  Well developed, well nourished, no acute distress Skin:  Warm and dry Neck:  Midline trachea, normal thyroid, good ROM, no lymphadenopathy Lungs; Clear to auscultation bilaterally Breast:  No dominant palpable mass, retraction, or nipple discharge Cardiovascular: Regular rate and rhythm Abdomen:  Soft, non tender, no hepatosplenomegaly, has black mole upper abdomen, no change  Pelvic:  External genitalia is normal in appearance, no lesions.  The vagina is normal in appearance, has +pink blood. Urethra has no lesions or masses. The cervix is bulbous. Pap with GC/CHL and HRHPV genotyping performed.  Uterus is felt to be normal size, shape, and contour.  No adnexal masses or tenderness noted.Bladder is non tender, no masses felt. Extremities/musculoskeletal:  No swelling or varicosities noted, no clubbing or cyanosis Psych:  No mood changes, alert and cooperative,seems happy AA is 1 Fall risk is low PHQ 9 score is 3 GAD 7 score is 6, she declines meds   [facility] - 09/18/20 0942      Pregnancy Intention Screening   Does the patient  want to become pregnant in the next year? No    Does the patient's partner want to become pregnant in the next year? No    Would the patient like to discuss contraceptive options today? No      Contraception Wrap Up   Current Method Hormonal Injection    End Method Hormonal Injection    Contraception Counseling Provided No         Examination chaperoned by Kaylee Pupa LPN   Impression and Plan: 1. Routine medical exam  2. Encounter for other contraceptive management Depo in office today  3. Encounter for gynecological examination with Papanicolaou smear of cervix Pap sent Physical in 1 year Pap in 3 if normal Mammogram at 38 Labs with PCP  4. Encounter for surveillance of injectable contraceptive Continue depo has refills Return in 12 weeks for next depo  5. Smoker She is trying to decrease cigarettes

## 2020-09-20 ENCOUNTER — Encounter: Payer: Self-pay | Admitting: Internal Medicine

## 2020-09-20 ENCOUNTER — Other Ambulatory Visit: Payer: Self-pay

## 2020-09-20 ENCOUNTER — Telehealth (INDEPENDENT_AMBULATORY_CARE_PROVIDER_SITE_OTHER): Payer: Medicaid Other | Admitting: Internal Medicine

## 2020-09-20 DIAGNOSIS — M5432 Sciatica, left side: Secondary | ICD-10-CM | POA: Diagnosis not present

## 2020-09-20 DIAGNOSIS — M5431 Sciatica, right side: Secondary | ICD-10-CM

## 2020-09-20 LAB — CYTOLOGY - PAP
Adequacy: ABSENT
Chlamydia: NEGATIVE
Comment: NEGATIVE
Comment: NEGATIVE
Comment: NORMAL
Diagnosis: UNDETERMINED — AB
High risk HPV: NEGATIVE
Neisseria Gonorrhea: NEGATIVE

## 2020-09-20 MED ORDER — TRAMADOL HCL 50 MG PO TABS: 50.0000 mg | ORAL_TABLET | Freq: Two times a day (BID) | ORAL | 0 refills | Status: AC | PRN

## 2020-09-20 NOTE — Progress Notes (Signed)
Virtual Visit via Telephone Note   This visit type was conducted due to national recommendations for restrictions regarding the COVID-19 Pandemic (e.g. social distancing) in an effort to limit this patient's exposure and mitigate transmission in our community.  Due to her co-morbid illnesses, this patient is at least at moderate risk for complications without adequate follow up.  This format is felt to be most appropriate for this patient at this time.  The patient did not have access to video technology/had technical difficulties with video requiring transitioning to audio format only (telephone).  All issues noted in this document were discussed and addressed.  No physical exam could be performed with this format.  Evaluation Performed:  Follow-up visit  Date:  09/20/2020   ID:  Kaylee Miller, Kaylee Miller [birth year], MRN [id number]  Patient Location: Home Provider Location: Office/Clinic  Participants: Patient Location of Patient: Home Location of Provider: Telehealth Consent was obtain for visit to be over via telehealth. I verified that I am speaking with the correct person using two identifiers.  PCP:  Lindell Spar, MD   Chief Complaint:  Back pain  History of Present Illness:    Kaylee Miller is a 30 y.o. female with PMH of migraine, irregular menstrual cycles, and tobacco abuse who has a televisit for c/o persistent back pain. She has been having lower back pain radiating to her buttocks for last 3 weeks and has been very difficult for her to sit.  She was recently seen by Donneta Romberg, NP in the office for sciatica. She has taken Toradol and Prednisone, which has made her leg pain better. She has been still having lower back pain radiating to her buttocks. She recently had menstruation as well, and had been taking Ibuprofen for cramping instead of Toradol. She denies any numbness or tingling in the LE. She denies urinary or stool incontinence. She denies any recent injury  or weight lifting. She asks whether the pain could be due to her work shoes. She is advised to use soft based shoes to avoid progression of back pain.  The patient does not have symptoms concerning for COVID-19 infection (fever, chills, cough, or new shortness of breath).   Past Medical, Surgical, Social History, Allergies, and Medications have been Reviewed.  Past Medical History:  Diagnosis Date  . Asthma    Phreesia 07/02/2020  . Contraceptive management 02/08/2015  . Headache(784.0)    migraines  . History of bronchitis   . HSV-2 (herpes simplex virus 2) infection   . Pregnant   . S/P C-section 07/28/2013  . Sciatic nerve pain   . Seasonal allergies    Past Surgical History:  Procedure Laterality Date  . adenoids    . CESAREAN SECTION  2007  . CESAREAN SECTION N/A 07/28/2013   Procedure: CESAREAN SECTION;  Surgeon: Jonnie Kind, MD;  Location: [facility];  Service: Obstetrics;  Laterality: N/A;  . tubes in ears       Current Meds  Medication Sig  . acetaminophen (TYLENOL) 500 MG tablet Take 1,500 mg every 6 (six) hours as needed by mouth for moderate pain or headache.  . albuterol (VENTOLIN HFA) 108 (90 Base) MCG/ACT inhaler Inhale 2 puffs into the lungs every 6 (six) hours as needed for wheezing or shortness of breath.  . Aspirin-Acetaminophen-Caffeine (EXCEDRIN MIGRAINE PO) Take by mouth.  . hydrocortisone (ANUSOL-HC) 2.5 % rectal cream Place 1 application rectally 2 (two) times daily.  Marland Kitchen ibuprofen (ADVIL) 200 MG tablet Take  800 mg by mouth as needed for cramping (taking 4, 200 mg tablets every 8 hours as needed).  . medroxyPROGESTERone Acetate 150 MG/ML SUSY INJECT 1 ML INTO THE MUSCLE EVERY 3 MONTHS  . tiZANidine (ZANAFLEX) 4 MG tablet Take 1 tablet (4 mg total) by mouth every 6 (six) hours as needed for muscle spasms.  . traMADol (ULTRAM) 50 MG tablet Take 1 tablet (50 mg total) by mouth every 12 (twelve) hours as needed for up to 8 days for moderate pain or severe pain.   . Vitamin D, Ergocalciferol, (DRISDOL) 1.25 MG (50000 UNIT) CAPS capsule Take 1 capsule (50,000 Units total) by mouth every 7 (seven) days.  . [DISCONTINUED] ketorolac (TORADOL) 10 MG tablet Take 1 tablet (10 mg total) by mouth 3 (three) times daily as needed. Do NOT take other NSAIDs with this (like ibuprofen, motril, advil, aleve, or naproxen)     Allergies:   Latex   ROS:   Please see the history of present illness.     All other systems reviewed and are negative.   Labs/Other Tests and Data Reviewed:    Recent Labs: 07/03/2020: ALT 23; BUN 11; Creatinine, Ser 0.75; Hemoglobin 13.5; Platelets 222; Potassium 4.2; Sodium 139; TSH 1.300   Recent Lipid Panel Lab Results  Component Value Date/Time   CHOL 223 (H) 07/03/2020 10:58 AM   TRIG 79 07/03/2020 10:58 AM   HDL 51 07/03/2020 10:58 AM   CHOLHDL 4.4 07/03/2020 10:58 AM   LDLCALC 158 (H) 07/03/2020 10:58 AM    Wt Readings from Last 3 Encounters:  09/18/20 191 lb (86.6 kg)  08/31/20 191 lb 12.8 oz (87 kg)  08/30/20 193 lb (87.5 kg)      ASSESSMENT & PLAN:    Bilateral sciatica S/p steroid and Toradol Mild improvement Prescribed Tramadol after reviewing PDMP Back exercises advised, material provided X-ray lumbar spine Orthopedic surgery referral  Time:   Today, I have spent 15 minutes reviewing the chart, including problem list, medications, and with the patient with telehealth technology discussing the above problems.   Medication Adjustments/Labs and Tests Ordered: Current medicines are reviewed at length with the patient today.  Concerns regarding medicines are outlined above.   Tests Ordered: Orders Placed This Encounter  Procedures  . DG Lumbar Spine Complete  . Ambulatory referral to Orthopedic Surgery    Medication Changes: Meds ordered this encounter  Medications  . traMADol (ULTRAM) 50 MG tablet    Sig: Take 1 tablet (50 mg total) by mouth every 12 (twelve) hours as needed for up to 8 days for  moderate pain or severe pain.    Dispense:  15 tablet    Refill:  0     Note: This dictation was prepared with Dragon dictation along with smaller phrase technology. Similar sounding words can be transcribed inadequately or may not be corrected upon review. Any transcriptional errors that result from this process are unintentional.      Disposition:  Follow up  Signed, Lindell Spar, MD  09/20/2020 4:02 PM     [facility]

## 2020-09-20 NOTE — Patient Instructions (Addendum)
Please get X-ray of the lumbar spine done at [facility].  Please take Tramadol for moderate to severe back pain.  You are being referred to Orthopedic surgery for evaluation of persistent back pain. Back Exercises These exercises help to make your trunk and back strong. They also help to keep the lower back flexible. Doing these exercises can help to prevent back pain or lessen existing pain.  If you have back pain, try to do these exercises 2-3 times each day or as told by your doctor.  As you get better, do the exercises once each day. Repeat the exercises more often as told by your doctor.  To stop back pain from coming back, do the exercises once each day, or as told by your doctor. Exercises Single knee to chest Do these steps 3-5 times in a row for each leg: 1. Lie on your back on a firm bed or the floor with your legs stretched out. 2. Bring one knee to your chest. 3. Grab your knee or thigh with both hands and hold them it in place. 4. Pull on your knee until you feel a gentle stretch in your lower back or buttocks. 5. Keep doing the stretch for 10-30 seconds. 6. Slowly let go of your leg and straighten it. Pelvic tilt Do these steps 5-10 times in a row: 1. Lie on your back on a firm bed or the floor with your legs stretched out. 2. Bend your knees so they point up to the ceiling. Your feet should be flat on the floor. 3. Tighten your lower belly (abdomen) muscles to press your lower back against the floor. This will make your tailbone point up to the ceiling instead of pointing down to your feet or the floor. 4. Stay in this position for 5-10 seconds while you gently tighten your muscles and breathe evenly. Cat-cow Do these steps until your lower back bends more easily: 1. Get on your hands and knees on a firm surface. Keep your hands under your shoulders, and keep your knees under your hips. You may put padding under your knees. 2. Let your head hang down toward your  chest. Tighten (contract) the muscles in your belly. Point your tailbone toward the floor so your lower back becomes rounded like the back of a cat. 3. Stay in this position for 5 seconds. 4. Slowly lift your head. Let the muscles of your belly relax. Point your tailbone up toward the ceiling so your back forms a sagging arch like the back of a cow. 5. Stay in this position for 5 seconds.   Press-ups Do these steps 5-10 times in a row: 1. Lie on your belly (face-down) on the floor. 2. Place your hands near your head, about shoulder-width apart. 3. While you keep your back relaxed and keep your hips on the floor, slowly straighten your arms to raise the top half of your body and lift your shoulders. Do not use your back muscles. You may change where you place your hands in order to make yourself more comfortable. 4. Stay in this position for 5 seconds. 5. Slowly return to lying flat on the floor.   Bridges Do these steps 10 times in a row: 1. Lie on your back on a firm surface. 2. Bend your knees so they point up to the ceiling. Your feet should be flat on the floor. Your arms should be flat at your sides, next to your body. 3. Tighten your butt muscles and  lift your butt off the floor until your waist is almost as high as your knees. If you do not feel the muscles working in your butt and the back of your thighs, slide your feet 1-2 inches farther away from your butt. 4. Stay in this position for 3-5 seconds. 5. Slowly lower your butt to the floor, and let your butt muscles relax. If this exercise is too easy, try doing it with your arms crossed over your chest.   Belly crunches Do these steps 5-10 times in a row: 1. Lie on your back on a firm bed or the floor with your legs stretched out. 2. Bend your knees so they point up to the ceiling. Your feet should be flat on the floor. 3. Cross your arms over your chest. 4. Tip your chin a little bit toward your chest but do not bend your  neck. 5. Tighten your belly muscles and slowly raise your chest just enough to lift your shoulder blades a tiny bit off of the floor. Avoid raising your body higher than that, because it can put too much stress on your low back. 6. Slowly lower your chest and your head to the floor. Back lifts Do these steps 5-10 times in a row: 1. Lie on your belly (face-down) with your arms at your sides, and rest your forehead on the floor. 2. Tighten the muscles in your legs and your butt. 3. Slowly lift your chest off of the floor while you keep your hips on the floor. Keep the back of your head in line with the curve in your back. Look at the floor while you do this. 4. Stay in this position for 3-5 seconds. 5. Slowly lower your chest and your face to the floor. Contact a doctor if:  Your back pain gets a lot worse when you do an exercise.  Your back pain does not get better 2 hours after you exercise. If you have any of these problems, stop doing the exercises. Do not do them again unless your doctor says it is okay. Get help right away if:  You have sudden, very bad back pain. If this happens, stop doing the exercises. Do not do them again unless your doctor says it is okay. This information is not intended to replace advice given to you by your health care provider. Make sure you discuss any questions you have with your health care provider. Document Revised: 05/06/2018 Document Reviewed: 05/06/2018 Elsevier Patient Education  2021 [employer].

## 2020-09-20 NOTE — Assessment & Plan Note (Signed)
Recently evaluated in office for sciatica Denies any numbness or tingling in the feet Was given Toradol and Prednisone Has persistent back pain Check X-ray lumbar spine Prescribed Tramadol Referred to Orthopedic surgery

## 2020-09-21 ENCOUNTER — Encounter: Payer: Self-pay | Admitting: Adult Health

## 2020-09-21 DIAGNOSIS — R87610 Atypical squamous cells of undetermined significance on cytologic smear of cervix (ASC-US): Secondary | ICD-10-CM | POA: Insufficient documentation

## 2020-09-26 ENCOUNTER — Other Ambulatory Visit: Payer: Self-pay

## 2020-09-26 ENCOUNTER — Ambulatory Visit (HOSPITAL_COMMUNITY)
Admission: RE | Admit: 2020-09-26 | Discharge: 2020-09-26 | Disposition: A | Discharge status: Home / Self Care | Payer: Medicaid Other | Source: Ambulatory Visit | Attending: Internal Medicine | Admitting: Internal Medicine

## 2020-09-26 DIAGNOSIS — M5431 Sciatica, right side: Secondary | ICD-10-CM

## 2020-09-26 DIAGNOSIS — M545 Low back pain, unspecified: Secondary | ICD-10-CM | POA: Diagnosis not present

## 2020-09-26 DIAGNOSIS — M5432 Sciatica, left side: Secondary | ICD-10-CM | POA: Insufficient documentation

## 2020-10-02 ENCOUNTER — Ambulatory Visit (INDEPENDENT_AMBULATORY_CARE_PROVIDER_SITE_OTHER): Payer: Medicaid Other | Admitting: Orthopaedic Surgery

## 2020-10-02 ENCOUNTER — Encounter: Payer: Self-pay | Admitting: Orthopaedic Surgery

## 2020-10-02 ENCOUNTER — Other Ambulatory Visit: Payer: Self-pay

## 2020-10-02 VITALS — BP 128/103 | HR 81 | Ht 67.0 in | Wt 187.1 lb

## 2020-10-02 DIAGNOSIS — M5441 Lumbago with sciatica, right side: Secondary | ICD-10-CM

## 2020-10-02 DIAGNOSIS — F17210 Nicotine dependence, cigarettes, uncomplicated: Secondary | ICD-10-CM

## 2020-10-02 DIAGNOSIS — G8929 Other chronic pain: Secondary | ICD-10-CM

## 2020-10-02 MED ORDER — NAPROXEN 500 MG PO TABS: 500.0000 mg | ORAL_TABLET | Freq: Two times a day (BID) | ORAL | 5 refills | Status: DC

## 2020-10-02 NOTE — Progress Notes (Signed)
Subjective:    Patient ID: Kaylee Miller, female    DOB: 1991/06/14, 30 y.o.   MRN: [id number]  HPI She has long history of lower back pain for several years.  She lifted a box at work around the first of the year and started having lower back pain. She did not report it at work.  Her pain has gotten worse.  She has been seen at [facility] for this, first visit was 08-30-20. She has been given prednisone, Zanaflex and Toradol.  The prednisone helped some.  She is still taking the Zanaflex and Toradol.  I have reviewed the notes from [facility], several visits.  She had x-rays of the lumbar spine.  I have independently reviewed and interpreted x-rays of this patient done at another site by another physician or qualified health professional.  She has pain in the lower back, more on the right side and radiating down the right leg past the knee to the mid lateral calf.  It is not getting any better.  She has no new trauma.  She has some days where the pain is much worse.  She has no weakness.   Review of Systems  Constitutional: Positive for activity change.  Respiratory: Positive for shortness of breath.   Musculoskeletal: Positive for arthralgias and back pain.  Allergic/Immunologic: Positive for environmental allergies.  Neurological: Positive for headaches.  All other systems reviewed and are negative.  For Review of Systems, all other systems reviewed and are negative.  The following is a summary of the past history medically, past history surgically, known current medicines, social history and family history.  This information is gathered electronically by the computer from prior information and documentation.  I review this each visit and have found including this information at this point in the chart is beneficial and informative.   Past Medical History:  Diagnosis Date  . Asthma    Phreesia 07/02/2020  . Contraceptive management 02/08/2015  .  Headache(784.0)    migraines  . History of bronchitis   . HSV-2 (herpes simplex virus 2) infection   . Pregnant   . S/P C-section 07/28/2013  . Sciatic nerve pain   . Seasonal allergies     Past Surgical History:  Procedure Laterality Date  . adenoids    . CESAREAN SECTION  2007  . CESAREAN SECTION N/A 07/28/2013   Procedure: CESAREAN SECTION;  Surgeon: Jonnie Kind, MD;  Location: [facility];  Service: Obstetrics;  Laterality: N/A;  . tubes in ears      Current Outpatient Medications on File Prior to Visit  Medication Sig Dispense Refill  . acetaminophen (TYLENOL) 500 MG tablet Take 1,500 mg every 6 (six) hours as needed by mouth for moderate pain or headache.    . albuterol (VENTOLIN HFA) 108 (90 Base) MCG/ACT inhaler Inhale 2 puffs into the lungs every 6 (six) hours as needed for wheezing or shortness of breath. 6.7 g 0  . Aspirin-Acetaminophen-Caffeine (EXCEDRIN MIGRAINE PO) Take by mouth.    . hydrocortisone (ANUSOL-HC) 2.5 % rectal cream Place 1 application rectally 2 (two) times daily. 30 g 0  . ibuprofen (ADVIL) 200 MG tablet Take 800 mg by mouth as needed for cramping (taking 4, 200 mg tablets every 8 hours as needed).    . medroxyPROGESTERone Acetate 150 MG/ML SUSY INJECT 1 ML INTO THE MUSCLE EVERY 3 MONTHS 1 mL 3  . tiZANidine (ZANAFLEX) 4 MG tablet Take 1 tablet (4 mg total) by  mouth every 6 (six) hours as needed for muscle spasms. 30 tablet 0  . Vitamin D, Ergocalciferol, (DRISDOL) 1.25 MG (50000 UNIT) CAPS capsule Take 1 capsule (50,000 Units total) by mouth every 7 (seven) days. 12 capsule 1   No current facility-administered medications on file prior to visit.    Social History   Socioeconomic History  . Marital status: Single    Spouse name: Not on file  . Number of children: Not on file  . Years of education: Not on file  . Highest education level: Not on file  Occupational History  . Not on file  Tobacco Use  . Smoking status: Current Some Day Smoker     Packs/day: 1.00    Years: 9.00    Pack years: 9.00    Types: Cigarettes  . Smokeless tobacco: Never Used  Vaping Use  . Vaping Use: Never used  Substance and Sexual Activity  . Alcohol use: Yes    Alcohol/week: 0.0 standard drinks  . Drug use: Never  . Sexual activity: Yes    Birth control/protection: Injection  Other Topics Concern  . Not on file  Social History Narrative  . Not on file   Social Determinants of Health   Financial Resource Strain: Medium Risk  . Difficulty of Paying Living Expenses: Somewhat hard  Food Insecurity: Food Insecurity Present  . Worried About Charity fundraiser in the Last Year: Sometimes true  . Ran Out of Food in the Last Year: Never true  Transportation Needs: No Transportation Needs  . Lack of Transportation (Medical): No  . Lack of Transportation (Non-Medical): No  Physical Activity: Sufficiently Active  . Days of Exercise per Week: 5 days  . Minutes of Exercise per Session: 100 min  Stress: No Stress Concern Present  . Feeling of Stress : Only a little  Social Connections: Moderately Integrated  . Frequency of Communication with Friends and Family: More than three times a week  . Frequency of Social Gatherings with Friends and Family: Twice a week  . Attends Religious Services: More than 4 times per year  . Active Member of Clubs or Organizations: Yes  . Attends Archivist Meetings: 1 to 4 times per year  . Marital Status: Never married  Intimate Partner Violence: Unknown  . Fear of Current or Ex-Partner: No  . Emotionally Abused: Patient refused  . Physically Abused: No  . Sexually Abused: No    Family History  Problem Relation Age of Onset  . Cancer Mother   . Breast cancer Mother   . Hypertension Father   . Stroke Father   . CAD Maternal Grandfather   . CAD Paternal Grandmother   . CAD Paternal Grandfather   . Diabetes Other        great-great grandmother  . CAD Other     BP (!) 128/103   Pulse 81   Ht 5'  7" (1.702 m)   Wt 187 lb 2 oz (84.9 kg)   BMI 29.31 kg/m   Body mass index is 29.31 kg/m.      Objective:   Physical Exam Vitals and nursing note reviewed. Exam conducted with a chaperone present.  Constitutional:      Appearance: She is well-developed and well-nourished.  HENT:     Head: Normocephalic and atraumatic.  Eyes:     Extraocular Movements: EOM normal.     Conjunctiva/sclera: Conjunctivae normal.     Pupils: Pupils are equal, round, and reactive to light.  Cardiovascular:     Rate and Rhythm: Normal rate and regular rhythm.     Pulses: Intact distal pulses.  Pulmonary:     Effort: Pulmonary effort is normal.  Abdominal:     Palpations: Abdomen is soft.  Musculoskeletal:       Arms:     Cervical back: Normal range of motion and neck supple.  Skin:    General: Skin is warm and dry.  Neurological:     Mental Status: She is alert and oriented to person, place, and time.     Cranial Nerves: No cranial nerve deficit.     Motor: No abnormal muscle tone.     Coordination: Coordination normal.     Deep Tendon Reflexes: Reflexes are normal and symmetric. Reflexes normal.  Psychiatric:        Mood and Affect: Mood and affect normal.        Behavior: Behavior normal.        Thought Content: Thought content normal.        Judgment: Judgment normal.           Assessment & Plan:   Encounter Diagnoses  Name Primary?  . Chronic midline low back pain with right-sided sciatica Yes  . Nicotine dependence, cigarettes, uncomplicated    I will try to get MRI.  I am concerned about HNP.  I will begin Naprosyn 500 po bid pc  Return in two weeks.  Call if any problem.  Precautions discussed.   Electronically Signed Sanjuana Kava, MD 2/8/20228:53 AM

## 2020-10-02 NOTE — Patient Instructions (Signed)

## 2020-10-06 ENCOUNTER — Other Ambulatory Visit: Payer: Self-pay

## 2020-10-06 ENCOUNTER — Ambulatory Visit
Admission: RE | Admit: 2020-10-06 | Discharge: 2020-10-06 | Disposition: A | Discharge status: Home / Self Care | Payer: Medicaid Other | Source: Ambulatory Visit | Attending: Emergency Medicine | Admitting: Emergency Medicine

## 2020-10-06 VITALS — BP 106/62 | HR 88 | Temp 99.5°F | Resp 18

## 2020-10-06 DIAGNOSIS — K0889 Other specified disorders of teeth and supporting structures: Secondary | ICD-10-CM | POA: Diagnosis not present

## 2020-10-06 DIAGNOSIS — K047 Periapical abscess without sinus: Secondary | ICD-10-CM | POA: Diagnosis not present

## 2020-10-06 MED ORDER — CHLORHEXIDINE GLUCONATE 0.12 % MT SOLN: 15.0000 mL | Freq: Two times a day (BID) | OROMUCOSAL | 0 refills | Status: DC

## 2020-10-06 MED ORDER — AMOXICILLIN-POT CLAVULANATE 875-125 MG PO TABS: 1.0000 | ORAL_TABLET | Freq: Two times a day (BID) | ORAL | 0 refills | Status: DC

## 2020-10-06 MED ORDER — NAPROXEN 500 MG PO TABS: 500.0000 mg | ORAL_TABLET | Freq: Two times a day (BID) | ORAL | 0 refills | Status: DC

## 2020-10-06 NOTE — ED Provider Notes (Signed)
[place]   [id number] 10/06/20 Arrival Time: 1217  CC: DENTAL PAIN  SUBJECTIVE:  Kaylee Miller is a 30 y.o. female resented to the urgent care with a complaint of dental pain for the past 2 days.  Developed the symptom after having her tooth pulled 2 days ago.  She is reporting discharge with a nasty taste at the site where the tooth were pulled. Has tried OTC analgesics without relief.  Worse with chewing.  States she got a Pharmacist, community on Friday but no one was available.  Denies similar symptoms in the past.  Denies fever, chills, dysphagia, odynophagia, oral or neck swelling, nausea, vomiting, chest pain, SOB.    ROS: As per HPI.  All other pertinent ROS negative.     Past Medical History:  Diagnosis Date  . Asthma    Phreesia 07/02/2020  . Contraceptive management 02/08/2015  . Headache(784.0)    migraines  . History of bronchitis   . HSV-2 (herpes simplex virus 2) infection   . Pregnant   . S/P C-section 07/28/2013  . Sciatic nerve pain   . Seasonal allergies    Past Surgical History:  Procedure Laterality Date  . adenoids    . CESAREAN SECTION  2007  . CESAREAN SECTION N/A 07/28/2013   Procedure: CESAREAN SECTION;  Surgeon: Jonnie Kind, MD;  Location: [facility];  Service: Obstetrics;  Laterality: N/A;  . tubes in ears     Allergies  Allergen Reactions  . Latex Swelling   No current facility-administered medications on file prior to encounter.   Current Outpatient Medications on File Prior to Encounter  Medication Sig Dispense Refill  . acetaminophen (TYLENOL) 500 MG tablet Take 1,500 mg every 6 (six) hours as needed by mouth for moderate pain or headache.    . albuterol (VENTOLIN HFA) 108 (90 Base) MCG/ACT inhaler Inhale 2 puffs into the lungs every 6 (six) hours as needed for wheezing or shortness of breath. 6.7 g 0  . Aspirin-Acetaminophen-Caffeine (EXCEDRIN MIGRAINE PO) Take by mouth.    . hydrocortisone (ANUSOL-HC) 2.5 % rectal cream Place 1  application rectally 2 (two) times daily. 30 g 0  . ibuprofen (ADVIL) 200 MG tablet Take 800 mg by mouth as needed for cramping (taking 4, 200 mg tablets every 8 hours as needed).    . medroxyPROGESTERone Acetate 150 MG/ML SUSY INJECT 1 ML INTO THE MUSCLE EVERY 3 MONTHS 1 mL 3  . tiZANidine (ZANAFLEX) 4 MG tablet Take 1 tablet (4 mg total) by mouth every 6 (six) hours as needed for muscle spasms. 30 tablet 0  . Vitamin D, Ergocalciferol, (DRISDOL) 1.25 MG (50000 UNIT) CAPS capsule Take 1 capsule (50,000 Units total) by mouth every 7 (seven) days. 12 capsule 1   Social History   Socioeconomic History  . Marital status: Single    Spouse name: Not on file  . Number of children: Not on file  . Years of education: Not on file  . Highest education level: Not on file  Occupational History  . Not on file  Tobacco Use  . Smoking status: Current Some Day Smoker    Packs/day: 1.00    Years: 9.00    Pack years: 9.00    Types: Cigarettes  . Smokeless tobacco: Never Used  Vaping Use  . Vaping Use: Never used  Substance and Sexual Activity  . Alcohol use: Yes    Alcohol/week: 0.0 standard drinks  . Drug use: Never  . Sexual activity: Yes  Birth control/protection: Injection  Other Topics Concern  . Not on file  Social History Narrative  . Not on file   Social Determinants of Health   Financial Resource Strain: Medium Risk  . Difficulty of Paying Living Expenses: Somewhat hard  Food Insecurity: Food Insecurity Present  . Worried About Charity fundraiser in the Last Year: Sometimes true  . Ran Out of Food in the Last Year: Never true  Transportation Needs: No Transportation Needs  . Lack of Transportation (Medical): No  . Lack of Transportation (Non-Medical): No  Physical Activity: Sufficiently Active  . Days of Exercise per Week: 5 days  . Minutes of Exercise per Session: 100 min  Stress: No Stress Concern Present  . Feeling of Stress : Only a little  Social Connections:  Moderately Integrated  . Frequency of Communication with Friends and Family: More than three times a week  . Frequency of Social Gatherings with Friends and Family: Twice a week  . Attends Religious Services: More than 4 times per year  . Active Member of Clubs or Organizations: Yes  . Attends Archivist Meetings: 1 to 4 times per year  . Marital Status: Never married  Intimate Partner Violence: Unknown  . Fear of Current or Ex-Partner: No  . Emotionally Abused: Patient refused  . Physically Abused: No  . Sexually Abused: No   Family History  Problem Relation Age of Onset  . Cancer Mother   . Breast cancer Mother   . Hypertension Father   . Stroke Father   . CAD Maternal Grandfather   . CAD Paternal Grandmother   . CAD Paternal Grandfather   . Diabetes Other        great-great grandmother  . CAD Other     OBJECTIVE: Physical Exam Vitals and nursing note reviewed.  Constitutional:      General: She is not in acute distress.    Appearance: Normal appearance. She is normal weight. She is not ill-appearing, toxic-appearing or diaphoretic.  HENT:     Head: Normocephalic.     Mouth/Throat:     Mouth: Mucous membranes are moist.     Dentition: Abnormal dentition. Dental abscesses present.     Tonsils: No tonsillar exudate.   Cardiovascular:     Rate and Rhythm: Normal rate and regular rhythm.     Pulses: Normal pulses.     Heart sounds: Normal heart sounds. No murmur heard. No friction rub. No gallop.   Pulmonary:     Effort: Pulmonary effort is normal. No respiratory distress.     Breath sounds: Normal breath sounds. No stridor. No wheezing, rhonchi or rales.  Chest:     Chest wall: No tenderness.  Neurological:     Mental Status: She is alert and oriented to person, place, and time.      ASSESSMENT & PLAN:  1. Pain, dental   2. Dental abscess     Meds ordered this encounter  Medications  . chlorhexidine (PERIDEX) 0.12 % solution    Sig: Use as  directed 15 mLs in the mouth or throat 2 (two) times daily.    Dispense:  120 mL    Refill:  0  . amoxicillin-clavulanate (AUGMENTIN) 875-125 MG tablet    Sig: Take 1 tablet by mouth every 12 (twelve) hours.    Dispense:  14 tablet    Refill:  0  . naproxen (NAPROSYN) 500 MG tablet    Sig: Take 1 tablet (500 mg total) by mouth  2 (two) times daily.    Dispense:  30 tablet    Refill:  0    Discharge instructions  Augmentin and chlorhexidine mouthwash were prescribed.  Use as directed for pain relief Naproxen was prescribed Recommend soft diet until evaluated by dentist Maintain oral hygiene care Follow up with dentist as soon as possible for further evaluation and treatment  Return or go to the ED if you have any new or worsening symptoms such as fever, chills, difficulty swallowing, painful swallowing, oral or neck swelling, nausea, vomiting, chest pain, SOB, etc...  Reviewed expectations re: course of current medical issues. Questions answered. Outlined signs and symptoms indicating need for more acute intervention. Patient verbalized understanding. After Visit Summary given.   Emerson Monte, FNP 10/06/20 1240

## 2020-10-06 NOTE — Discharge Instructions (Addendum)
Augmentin and chlorhexidine mouthwash were prescribed.  Use as directed for pain relief Naproxen was prescribed Recommend soft diet until evaluated by dentist Maintain oral hygiene care Follow up with dentist as soon as possible for further evaluation and treatment  Return or go to the ED if you have any new or worsening symptoms such as fever, chills, difficulty swallowing, painful swallowing, oral or neck swelling, nausea, vomiting, chest pain, SOB, et

## 2020-10-06 NOTE — ED Triage Notes (Signed)
Pt reports she had a tooth pulled 2 days ago, and she is having drainage from the site they pulled the tooth. States the drainage taste "nasty" and makes her stomach upset and bad breath in the mouth. Reports gum pain.

## 2020-10-23 ENCOUNTER — Ambulatory Visit: Payer: Medicaid Other | Admitting: Orthopaedic Surgery

## 2020-10-29 ENCOUNTER — Ambulatory Visit (HOSPITAL_COMMUNITY)
Admission: RE | Admit: 2020-10-29 | Discharge: 2020-10-29 | Disposition: A | Discharge status: Home / Self Care | Payer: Medicaid Other | Source: Ambulatory Visit | Attending: Orthopaedic Surgery | Admitting: Orthopaedic Surgery

## 2020-10-29 ENCOUNTER — Telehealth: Payer: Self-pay

## 2020-10-29 ENCOUNTER — Other Ambulatory Visit: Payer: Self-pay

## 2020-10-29 DIAGNOSIS — G8929 Other chronic pain: Secondary | ICD-10-CM

## 2020-10-29 NOTE — Telephone Encounter (Signed)
She can make a virtual appointment to discuss this with dr patel please schedule

## 2020-10-29 NOTE — Telephone Encounter (Signed)
Patient calling wanting to know what else she can do for her pain she had to r/s her mri ph# [phone]

## 2020-10-30 ENCOUNTER — Telehealth (INDEPENDENT_AMBULATORY_CARE_PROVIDER_SITE_OTHER): Payer: Medicaid Other | Admitting: Internal Medicine

## 2020-10-30 ENCOUNTER — Encounter: Payer: Self-pay | Admitting: Internal Medicine

## 2020-10-30 ENCOUNTER — Ambulatory Visit: Payer: Medicaid Other | Admitting: Orthopaedic Surgery

## 2020-10-30 ENCOUNTER — Telehealth: Payer: Medicaid Other | Admitting: Internal Medicine

## 2020-10-30 DIAGNOSIS — M5431 Sciatica, right side: Secondary | ICD-10-CM

## 2020-10-30 DIAGNOSIS — M5432 Sciatica, left side: Secondary | ICD-10-CM | POA: Diagnosis not present

## 2020-10-30 NOTE — Patient Instructions (Signed)

## 2020-10-30 NOTE — Progress Notes (Signed)
Virtual Visit via Telephone Note   This visit type was conducted due to national recommendations for restrictions regarding the COVID-19 Pandemic (e.g. social distancing) in an effort to limit this patient's exposure and mitigate transmission in our community.  Due to her co-morbid illnesses, this patient is at least at moderate risk for complications without adequate follow up.  This format is felt to be most appropriate for this patient at this time.  The patient did not have access to video technology/had technical difficulties with video requiring transitioning to audio format only (telephone).  All issues noted in this document were discussed and addressed.  No physical exam could be performed with this format.  Evaluation Performed:  Follow-up visit  Date:  10/30/2020   ID:  Kaylee Miller, DOB 20-Apr-1991, MRN [id number]  Patient Location: Home Provider Location: Office/Clinic  Participants: Patient Location of Patient: Home Location of Provider: Telehealth Consent was obtain for visit to be over via telehealth. I verified that I am speaking with the correct person using two identifiers.  PCP:  Lindell Spar, MD   Chief Complaint:  Back pain  History of Present Illness:    Kaylee Miller is a 30 y.o. female with PMH of migraine,irregular menstrual cycles, and tobacco abuse who has a televisit for c/o persistent back pain. She describes the pain as dull, constant, radiating to the buttocks and legs. She denies any numbness, tingling or weakness of the legs.  She was evaluated by Orthopedic surgeon and had to get an MRI yesterday, but could not get it done due to her lip piercing. She has to reschedule her MRI after getting piercing removed. She was prescribed Naproxen by Orthopedic surgeon, and Tizanidine and Tramadol by our office. She states that all of these medications make her sleepy and she has not been taking any medicine on regular basis.  The patient does not  have symptoms concerning for COVID-19 infection (fever, chills, cough, or new shortness of breath).   Past Medical, Surgical, Social History, Allergies, and Medications have been Reviewed.  Past Medical History:  Diagnosis Date  . Asthma    Phreesia 07/02/2020  . Contraceptive management 02/08/2015  . Headache(784.0)    migraines  . History of bronchitis   . HSV-2 (herpes simplex virus 2) infection   . Pregnant   . S/P C-section 07/28/2013  . Sciatic nerve pain   . Seasonal allergies    Past Surgical History:  Procedure Laterality Date  . adenoids    . CESAREAN SECTION  2007  . CESAREAN SECTION N/A 07/28/2013   Procedure: CESAREAN SECTION;  Surgeon: Jonnie Kind, MD;  Location: [facility];  Service: Obstetrics;  Laterality: N/A;  . tubes in ears       Current Meds  Medication Sig  . acetaminophen (TYLENOL) 500 MG tablet Take 1,500 mg every 6 (six) hours as needed by mouth for moderate pain or headache.  . albuterol (VENTOLIN HFA) 108 (90 Base) MCG/ACT inhaler Inhale 2 puffs into the lungs every 6 (six) hours as needed for wheezing or shortness of breath.  . Aspirin-Acetaminophen-Caffeine (EXCEDRIN MIGRAINE PO) Take by mouth.  . chlorhexidine (PERIDEX) 0.12 % solution Use as directed 15 mLs in the mouth or throat 2 (two) times daily.  . hydrocortisone (ANUSOL-HC) 2.5 % rectal cream Place 1 application rectally 2 (two) times daily.  Marland Kitchen ibuprofen (ADVIL) 200 MG tablet Take 800 mg by mouth as needed for cramping (taking 4, 200 mg tablets every 8  hours as needed).  . medroxyPROGESTERone Acetate 150 MG/ML SUSY INJECT 1 ML INTO THE MUSCLE EVERY 3 MONTHS  . naproxen (NAPROSYN) 500 MG tablet Take 1 tablet (500 mg total) by mouth 2 (two) times daily.  Marland Kitchen tiZANidine (ZANAFLEX) 4 MG tablet Take 1 tablet (4 mg total) by mouth every 6 (six) hours as needed for muscle spasms.     Allergies:   Latex   ROS:   Please see the history of present illness.     All other systems reviewed and are  negative.   Labs/Other Tests and Data Reviewed:    Recent Labs: 07/03/2020: ALT 23; BUN 11; Creatinine, Ser 0.75; Hemoglobin 13.5; Platelets 222; Potassium 4.2; Sodium 139; TSH 1.300   Recent Lipid Panel Lab Results  Component Value Date/Time   CHOL 223 (H) 07/03/2020 10:58 AM   TRIG 79 07/03/2020 10:58 AM   HDL 51 07/03/2020 10:58 AM   CHOLHDL 4.4 07/03/2020 10:58 AM   LDLCALC 158 (H) 07/03/2020 10:58 AM    Wt Readings from Last 3 Encounters:  10/02/20 187 lb 2 oz (84.9 kg)  09/18/20 191 lb (86.6 kg)  08/31/20 191 lb 12.8 oz (87 kg)      ASSESSMENT & PLAN:    Bilateral sciatica Naproxen PRN Tramadol for severe pain Back exercises, material provided F/u with Orthopedic surgery, pending MRI Advised to take Naproxen as it should not be sedating.  Time:   Today, I have spent 13 minutes reviewing the chart, including problem list, medications, and with the patient with telehealth technology discussing the above problems.   Medication Adjustments/Labs and Tests Ordered: Current medicines are reviewed at length with the patient today.  Concerns regarding medicines are outlined above.   Tests Ordered: No orders of the defined types were placed in this encounter.   Medication Changes: No orders of the defined types were placed in this encounter.    Note: This dictation was prepared with Dragon dictation along with smaller phrase technology. Similar sounding words can be transcribed inadequately or may not be corrected upon review. Any transcriptional errors that result from this process are unintentional.      Disposition:  Follow up  Signed, Lindell Spar, MD  10/30/2020 9:03 AM     [facility]

## 2020-11-01 ENCOUNTER — Other Ambulatory Visit: Payer: Self-pay

## 2020-11-01 ENCOUNTER — Ambulatory Visit (HOSPITAL_COMMUNITY)
Admission: RE | Admit: 2020-11-01 | Discharge: 2020-11-01 | Disposition: A | Discharge status: Home / Self Care | Payer: Medicaid Other | Source: Ambulatory Visit | Attending: Orthopaedic Surgery | Admitting: Orthopaedic Surgery

## 2020-11-01 DIAGNOSIS — M545 Low back pain, unspecified: Secondary | ICD-10-CM | POA: Diagnosis not present

## 2020-11-01 DIAGNOSIS — G8929 Other chronic pain: Secondary | ICD-10-CM | POA: Diagnosis not present

## 2020-11-01 DIAGNOSIS — M5441 Lumbago with sciatica, right side: Secondary | ICD-10-CM | POA: Diagnosis not present

## 2020-11-06 ENCOUNTER — Ambulatory Visit (INDEPENDENT_AMBULATORY_CARE_PROVIDER_SITE_OTHER): Payer: Medicaid Other | Admitting: Orthopaedic Surgery

## 2020-11-06 ENCOUNTER — Encounter: Payer: Self-pay | Admitting: Orthopaedic Surgery

## 2020-11-06 ENCOUNTER — Other Ambulatory Visit: Payer: Self-pay

## 2020-11-06 VITALS — BP 122/66 | HR 82 | Ht 67.0 in | Wt 187.0 lb

## 2020-11-06 DIAGNOSIS — M5441 Lumbago with sciatica, right side: Secondary | ICD-10-CM

## 2020-11-06 DIAGNOSIS — F17210 Nicotine dependence, cigarettes, uncomplicated: Secondary | ICD-10-CM | POA: Diagnosis not present

## 2020-11-06 DIAGNOSIS — G8929 Other chronic pain: Secondary | ICD-10-CM | POA: Diagnosis not present

## 2020-11-06 MED ORDER — TIZANIDINE HCL 4 MG PO TABS: 4.0000 mg | ORAL_TABLET | Freq: Four times a day (QID) | ORAL | 0 refills | Status: DC | PRN

## 2020-11-06 MED ORDER — TRAMADOL HCL 50 MG PO TABS: 50.0000 mg | ORAL_TABLET | Freq: Four times a day (QID) | ORAL | 0 refills | Status: AC | PRN

## 2020-11-06 NOTE — Patient Instructions (Signed)
Note for work. Patient to work no more than 8 hours a day until seen by neurosurgeon.

## 2020-11-06 NOTE — Progress Notes (Signed)
Patient OI:[name], female DOB:1990/08/30, 30 y.o. NOB:[id number]  Chief Complaint  Patient presents with  . Back Pain  . Results    Review Lumbar MRI     HPI  Kaylee Miller is a 30 y.o. female who has lower back pain with right sciatica pain.  She had MRI which showed: IMPRESSION: Lumbar spine spondylosis most notable at L5-S1 with a large right lateral recess/foraminal disc extrusion heading in the caudal direction which contacts and impinges the descending right S1 nerve root as well as contact the exiting right L5 nerve root. There is severe right and moderate left neural foraminal narrowing as well mild central canal stenosis  She was told of the findings.  I will have neurosurgery see her and evaluate her further.  She agrees.  I have independently reviewed the MRI.     She is not improved.  She is taking her medicine.   Body mass index is 29.29 kg/m.  ROS  Review of Systems  Constitutional: Positive for activity change.  Respiratory: Positive for shortness of breath.   Musculoskeletal: Positive for arthralgias and back pain.  Allergic/Immunologic: Positive for environmental allergies.  Neurological: Positive for headaches.  All other systems reviewed and are negative.   All other systems reviewed and are negative.  The following is a summary of the past history medically, past history surgically, known current medicines, social history and family history.  This information is gathered electronically by the computer from prior information and documentation.  I review this each visit and have found including this information at this point in the chart is beneficial and informative.    Past Medical History:  Diagnosis Date  . Asthma    Phreesia 07/02/2020  . Contraceptive management 02/08/2015  . Headache(784.0)    migraines  . History of bronchitis   . HSV-2 (herpes simplex virus 2) infection   . Pregnant   . S/P C-section 07/28/2013  . Sciatic  nerve pain   . Seasonal allergies     Past Surgical History:  Procedure Laterality Date  . adenoids    . CESAREAN SECTION  2007  . CESAREAN SECTION N/A 07/28/2013   Procedure: CESAREAN SECTION;  Surgeon: Jonnie Kind, MD;  Location: [facility];  Service: Obstetrics;  Laterality: N/A;  . tubes in ears      Family History  Problem Relation Age of Onset  . Cancer Mother   . Breast cancer Mother   . Hypertension Father   . Stroke Father   . CAD Maternal Grandfather   . CAD Paternal Grandmother   . CAD Paternal Grandfather   . Diabetes Other        great-great grandmother  . CAD Other     Social History Social History   Tobacco Use  . Smoking status: Current Some Day Smoker    Packs/day: 1.00    Years: 9.00    Pack years: 9.00    Types: Cigarettes  . Smokeless tobacco: Never Used  Vaping Use  . Vaping Use: Never used  Substance Use Topics  . Alcohol use: Yes    Alcohol/week: 0.0 standard drinks  . Drug use: Never    Allergies  Allergen Reactions  . Latex Swelling    Current Outpatient Medications  Medication Sig Dispense Refill  . acetaminophen (TYLENOL) 500 MG tablet Take 1,500 mg every 6 (six) hours as needed by mouth for moderate pain or headache.    . albuterol (VENTOLIN HFA) 108 (90 Base) MCG/ACT inhaler  Inhale 2 puffs into the lungs every 6 (six) hours as needed for wheezing or shortness of breath. 6.7 g 0  . Aspirin-Acetaminophen-Caffeine (EXCEDRIN MIGRAINE PO) Take by mouth.    . chlorhexidine (PERIDEX) 0.12 % solution Use as directed 15 mLs in the mouth or throat 2 (two) times daily. 120 mL 0  . hydrocortisone (ANUSOL-HC) 2.5 % rectal cream Place 1 application rectally 2 (two) times daily. 30 g 0  . ibuprofen (ADVIL) 200 MG tablet Take 800 mg by mouth as needed for cramping (taking 4, 200 mg tablets every 8 hours as needed).    . medroxyPROGESTERone Acetate 150 MG/ML SUSY INJECT 1 ML INTO THE MUSCLE EVERY 3 MONTHS 1 mL 3  . naproxen (NAPROSYN) 500 MG  tablet Take 1 tablet (500 mg total) by mouth 2 (two) times daily. 30 tablet 0  . tiZANidine (ZANAFLEX) 4 MG tablet Take 1 tablet (4 mg total) by mouth every 6 (six) hours as needed for muscle spasms. 30 tablet 0   No current facility-administered medications for this visit.     Physical Exam  Blood pressure 122/66, pulse 82, height 5\' 7"  (1.702 m), weight 187 lb (84.8 kg).  Constitutional: overall normal hygiene, normal nutrition, well developed, normal grooming, normal body habitus. Assistive device:none  Musculoskeletal: gait and station Limp none, muscle tone and strength are normal, no tremors or atrophy is present.  .  Neurological: coordination overall normal.  Deep tendon reflex/nerve stretch intact.  Sensation normal.  Cranial nerves II-XII intact.   Skin:   Normal overall no scars, lesions, ulcers or rashes. No psoriasis.  Psychiatric: Alert and oriented x 3.  Recent memory intact, remote memory unclear.  Normal mood and affect. Well groomed.  Good eye contact.  Cardiovascular: overall no swelling, no varicosities, no edema bilaterally, normal temperatures of the legs and arms, no clubbing, cyanosis and good capillary refill.  Lymphatic: palpation is normal.  Spine/Pelvis examination:  Inspection:  Overall, sacoiliac joint benign and hips nontender; without crepitus or defects.   Thoracic spine inspection: Alignment normal without kyphosis present   Lumbar spine inspection:  Alignment  with normal lumbar lordosis, without scoliosis apparent.   Thoracic spine palpation:  without tenderness of spinal processes   Lumbar spine palpation: without tenderness of lumbar area; without tightness of lumbar muscles    Range of Motion:   Lumbar flexion, forward flexion is normal without pain or tenderness    Lumbar extension is full without pain or tenderness   Left lateral bend is normal without pain or tenderness   Right lateral bend is normal without pain or  tenderness   Straight leg raising is normal  Strength & tone: normal   Stability overall normal stability ' All other systems reviewed and are negative   The patient has been educated about the nature of the problem(s) and counseled on treatment options.  The patient appeared to understand what I have discussed and is in agreement with it.  Encounter Diagnoses  Name Primary?  . Chronic midline low back pain with right-sided sciatica Yes  . Nicotine dependence, cigarettes, uncomplicated     PLAN Call if any problems.  Precautions discussed.  Continue current medications.   Return to clinic to neurosurgery   Limit work to eight hour days.  I have refilled her medicine.  I have reviewed the [facility] web site prior to prescribing narcotic medicine for this patient.   Electronically Signed Sanjuana Kava, MD 3/15/202210:16 AM

## 2020-11-12 DIAGNOSIS — M5126 Other intervertebral disc displacement, lumbar region: Secondary | ICD-10-CM | POA: Insufficient documentation

## 2020-11-12 DIAGNOSIS — I10 Essential (primary) hypertension: Secondary | ICD-10-CM | POA: Insufficient documentation

## 2020-11-20 DIAGNOSIS — Z6829 Body mass index (BMI) 29.0-29.9, adult: Secondary | ICD-10-CM | POA: Diagnosis not present

## 2020-11-20 DIAGNOSIS — M5126 Other intervertebral disc displacement, lumbar region: Secondary | ICD-10-CM | POA: Diagnosis not present

## 2020-11-23 ENCOUNTER — Telehealth: Payer: Self-pay

## 2020-11-23 NOTE — Telephone Encounter (Signed)
Spoke with the pt regarding a work note. I advised that we would not write the work note with restrictions, as we are not the ones that are actively working with her on her back issues. Per the pt [facility] is working with the pt, and will be given injections to the pt.

## 2020-12-04 DIAGNOSIS — M5126 Other intervertebral disc displacement, lumbar region: Secondary | ICD-10-CM | POA: Diagnosis not present

## 2020-12-10 ENCOUNTER — Telehealth: Payer: Self-pay

## 2020-12-10 NOTE — Telephone Encounter (Signed)
Per patient, patient called regarding her FMLA forms her  Neurologist denied her FMLA paperwork since she is not having surgeries, back injections and incapacity.  Patient call back # [phone] discuss about FMLA.

## 2020-12-11 ENCOUNTER — Other Ambulatory Visit: Payer: Self-pay

## 2020-12-11 ENCOUNTER — Ambulatory Visit (INDEPENDENT_AMBULATORY_CARE_PROVIDER_SITE_OTHER): Payer: Medicaid Other

## 2020-12-11 DIAGNOSIS — Z3042 Encounter for surveillance of injectable contraceptive: Secondary | ICD-10-CM

## 2020-12-11 MED ORDER — MEDROXYPROGESTERONE ACETATE 150 MG/ML IM SUSY
PREFILLED_SYRINGE | Freq: Once | INTRAMUSCULAR | Status: AC
Start: 2020-12-11 — End: 2020-12-11

## 2020-12-11 NOTE — Progress Notes (Signed)
   NURSE VISIT- INJECTION  SUBJECTIVE:  Kaylee Miller is a 30 y.o. [id number] female here for a Depo Provera for contraception/period management. She is a GYN patient.   OBJECTIVE:  There were no vitals taken for this visit.  Appears well, in no apparent distress  Injection administered in: Right upper quad. gluteus  Meds ordered this encounter  Medications  . medroxyPROGESTERone Acetate SUSY    ASSESSMENT: GYN patient Depo Provera for contraception/period management PLAN: Follow-up: in 11-13 weeks for next Depo   Deshauna Cayson A Elodia Haviland  12/11/2020 10:43 AM

## 2020-12-12 NOTE — Telephone Encounter (Signed)
Appointment scheduled.

## 2020-12-12 NOTE — Telephone Encounter (Signed)
Patient has not been here since March would need a virtual appointment to discuss with provider

## 2020-12-19 ENCOUNTER — Ambulatory Visit: Payer: Medicaid Other | Admitting: Internal Medicine

## 2020-12-19 ENCOUNTER — Telehealth: Payer: Self-pay

## 2020-12-19 ENCOUNTER — Encounter: Payer: Self-pay | Admitting: Internal Medicine

## 2020-12-19 ENCOUNTER — Other Ambulatory Visit: Payer: Self-pay

## 2020-12-19 VITALS — BP 113/61 | HR 80 | Resp 18 | Ht 67.0 in | Wt 189.4 lb

## 2020-12-19 DIAGNOSIS — M48062 Spinal stenosis, lumbar region with neurogenic claudication: Secondary | ICD-10-CM | POA: Diagnosis not present

## 2020-12-19 DIAGNOSIS — E559 Vitamin D deficiency, unspecified: Secondary | ICD-10-CM

## 2020-12-19 DIAGNOSIS — M5126 Other intervertebral disc displacement, lumbar region: Secondary | ICD-10-CM | POA: Diagnosis not present

## 2020-12-19 MED ORDER — VITAMIN D (ERGOCALCIFEROL) 1.25 MG (50000 UNIT) PO CAPS: 50000.0000 [IU] | ORAL_CAPSULE | ORAL | 5 refills | Status: DC

## 2020-12-19 NOTE — Assessment & Plan Note (Signed)
MRI of the lumbar spine reviewed Follows with Spine specialist Physical therapy Naproxen PRN Avoid heavy lifting and long shifts > 8 hours

## 2020-12-19 NOTE — Progress Notes (Signed)
Acute Office Visit  Subjective:    Patient ID: Kaylee Miller, female    DOB: Apr 30, 1991, 30 y.o.   MRN: [id number]  Chief Complaint  Patient presents with  . Back Pain    Back pain was originally saw here has seen dr Luna Glasgow and saw dr Christella Noa she has to do physical therapy but needs fmla     HPI Patient is in today for evaluation of chronic back pain and for FMLA paperwork.  She has had visit with Orthopedic surgeon and Spine specialist. She has been taking PRN Naproxen with minimal help. Pain is constant, worse with bending and is radiating to b/l LE. MRI showed foraminal narrowing and disc protrusion. She is scheduled to start physical therapy in the next week.  Past Medical History:  Diagnosis Date  . Asthma    Phreesia 07/02/2020  . Contraceptive management 02/08/2015  . Headache(784.0)    migraines  . History of bronchitis   . HSV-2 (herpes simplex virus 2) infection   . Pregnant   . S/P C-section 07/28/2013  . Sciatic nerve pain   . Seasonal allergies     Past Surgical History:  Procedure Laterality Date  . adenoids    . CESAREAN SECTION  2007  . CESAREAN SECTION N/A 07/28/2013   Procedure: CESAREAN SECTION;  Surgeon: Jonnie Kind, MD;  Location: [facility];  Service: Obstetrics;  Laterality: N/A;  . tubes in ears      Family History  Problem Relation Age of Onset  . Cancer Mother   . Breast cancer Mother   . Hypertension Father   . Stroke Father   . CAD Maternal Grandfather   . CAD Paternal Grandmother   . CAD Paternal Grandfather   . Diabetes Other        great-great grandmother  . CAD Other     Social History   Socioeconomic History  . Marital status: Single    Spouse name: Not on file  . Number of children: Not on file  . Years of education: Not on file  . Highest education level: Not on file  Occupational History  . Not on file  Tobacco Use  . Smoking status: Current Some Day Smoker    Packs/day: 1.00    Years: 9.00    Pack years:  9.00    Types: Cigarettes  . Smokeless tobacco: Never Used  Vaping Use  . Vaping Use: Never used  Substance and Sexual Activity  . Alcohol use: Yes    Alcohol/week: 0.0 standard drinks  . Drug use: Never  . Sexual activity: Yes    Birth control/protection: Injection  Other Topics Concern  . Not on file  Social History Narrative  . Not on file   Social Determinants of Health   Financial Resource Strain: Medium Risk  . Difficulty of Paying Living Expenses: Somewhat hard  Food Insecurity: Food Insecurity Present  . Worried About Charity fundraiser in the Last Year: Sometimes true  . Ran Out of Food in the Last Year: Never true  Transportation Needs: No Transportation Needs  . Lack of Transportation (Medical): No  . Lack of Transportation (Non-Medical): No  Physical Activity: Sufficiently Active  . Days of Exercise per Week: 5 days  . Minutes of Exercise per Session: 100 min  Stress: No Stress Concern Present  . Feeling of Stress : Only a little  Social Connections: Moderately Integrated  . Frequency of Communication with Friends and Family: More than three times  a week  . Frequency of Social Gatherings with Friends and Family: Twice a week  . Attends Religious Services: More than 4 times per year  . Active Member of Clubs or Organizations: Yes  . Attends Archivist Meetings: 1 to 4 times per year  . Marital Status: Never married  Intimate Partner Violence: Unknown  . Fear of Current or Ex-Partner: No  . Emotionally Abused: Patient refused  . Physically Abused: No  . Sexually Abused: No    Outpatient Medications Prior to Visit  Medication Sig Dispense Refill  . acetaminophen (TYLENOL) 500 MG tablet Take 1,500 mg every 6 (six) hours as needed by mouth for moderate pain or headache.    . albuterol (VENTOLIN HFA) 108 (90 Base) MCG/ACT inhaler Inhale 2 puffs into the lungs every 6 (six) hours as needed for wheezing or shortness of breath. 6.7 g 0  .  Aspirin-Acetaminophen-Caffeine (EXCEDRIN MIGRAINE PO) Take by mouth.    . chlorhexidine (PERIDEX) 0.12 % solution Use as directed 15 mLs in the mouth or throat 2 (two) times daily. 120 mL 0  . hydrocortisone (ANUSOL-HC) 2.5 % rectal cream Place 1 application rectally 2 (two) times daily. 30 g 0  . ibuprofen (ADVIL) 200 MG tablet Take 800 mg by mouth as needed for cramping (taking 4, 200 mg tablets every 8 hours as needed).    . medroxyPROGESTERone Acetate 150 MG/ML SUSY INJECT 1 ML INTO THE MUSCLE EVERY 3 MONTHS 1 mL 3  . naproxen (NAPROSYN) 500 MG tablet Take 1 tablet (500 mg total) by mouth 2 (two) times daily. 30 tablet 0  . tiZANidine (ZANAFLEX) 4 MG tablet Take 1 tablet (4 mg total) by mouth every 6 (six) hours as needed for muscle spasms. 30 tablet 0   No facility-administered medications prior to visit.    Allergies  Allergen Reactions  . Latex Swelling    Review of Systems  Constitutional: Negative for chills and fever.  HENT: Negative for congestion, sinus pressure, sinus pain and sore throat.   Eyes: Negative for pain and discharge.  Respiratory: Negative for cough and shortness of breath.   Cardiovascular: Negative for chest pain and palpitations.  Gastrointestinal: Negative for abdominal pain, constipation, diarrhea, nausea and vomiting.  Endocrine: Negative for polydipsia and polyuria.  Genitourinary: Negative for dysuria and hematuria.  Musculoskeletal: Positive for back pain. Negative for neck pain and neck stiffness.  Skin: Negative for rash.  Neurological: Positive for numbness (LE). Negative for dizziness, speech difficulty, weakness and headaches.  Psychiatric/Behavioral: Negative for agitation and behavioral problems.       Objective:    Physical Exam Vitals reviewed.  Constitutional:      General: She is not in acute distress.    Appearance: She is not diaphoretic.  HENT:     Head: Normocephalic and atraumatic.     Nose: Nose normal. No congestion.      Mouth/Throat:     Mouth: Mucous membranes are moist.     Pharynx: No posterior oropharyngeal erythema.  Eyes:     General: No scleral icterus.    Extraocular Movements: Extraocular movements intact.  Cardiovascular:     Rate and Rhythm: Normal rate and regular rhythm.     Pulses: Normal pulses.     Heart sounds: Normal heart sounds. No murmur heard.   Pulmonary:     Breath sounds: Normal breath sounds. No wheezing or rales.  Musculoskeletal:     Cervical back: Neck supple. No tenderness.  Right lower leg: No edema.     Left lower leg: No edema.     Comments: ROM of the lumbar spine limited due to pain, no point tenderness in the lumbar area  Skin:    General: Skin is warm.     Findings: No rash.  Neurological:     General: No focal deficit present.     Mental Status: She is alert and oriented to person, place, and time.  Psychiatric:        Mood and Affect: Mood normal.        Behavior: Behavior normal.     BP 113/61 (BP Location: Right Arm, Patient Position: Sitting, Cuff Size: Normal)   Pulse 80   Resp 18   Ht 5\' 7"  (1.702 m)   Wt 189 lb 6.4 oz (85.9 kg)   SpO2 99%   BMI 29.66 kg/m  Wt Readings from Last 3 Encounters:  12/19/20 189 lb 6.4 oz (85.9 kg)  11/06/20 187 lb (84.8 kg)  10/02/20 187 lb 2 oz (84.9 kg)    Health Maintenance Due  Topic Date Due  . Hepatitis C Screening  Never done  . COVID-19 Vaccine (3 - Booster for Moderna series) 09/02/2020    There are no preventive care reminders to display for this patient.   Lab Results  Component Value Date   TSH 1.300 07/03/2020   Lab Results  Component Value Date   WBC 6.6 07/03/2020   HGB 13.5 07/03/2020   HCT 40.9 07/03/2020   MCV 84 07/03/2020   PLT 222 07/03/2020   Lab Results  Component Value Date   NA 139 07/03/2020   K 4.2 07/03/2020   CO2 22 07/03/2020   GLUCOSE 84 07/03/2020   BUN 11 07/03/2020   CREATININE 0.75 07/03/2020   BILITOT 0.5 07/03/2020   ALKPHOS 57 07/03/2020   AST  13 07/03/2020   ALT 23 07/03/2020   PROT 6.6 07/03/2020   ALBUMIN 4.2 07/03/2020   CALCIUM 9.3 07/03/2020   ANIONGAP 7 01/09/2020   Lab Results  Component Value Date   CHOL 223 (H) 07/03/2020   Lab Results  Component Value Date   HDL 51 07/03/2020   Lab Results  Component Value Date   LDLCALC 158 (H) 07/03/2020   Lab Results  Component Value Date   TRIG 79 07/03/2020   Lab Results  Component Value Date   CHOLHDL 4.4 07/03/2020   No results found for: HGBA1C     Assessment & Plan:   Problem List Items Addressed This Visit      Musculoskeletal and Integument   Disc displacement, lumbar    MRI of the lumbar spine reviewed Work restrictions discussed Naproxen PRN PT        Other   Spinal stenosis of lumbar region with neurogenic claudication - Primary    MRI of the lumbar spine reviewed Follows with Spine specialist Physical therapy Naproxen PRN Avoid heavy lifting and long shifts > 8 hours       Other Visit Diagnoses    Vitamin D deficiency       Relevant Medications   Vitamin D, Ergocalciferol, (DRISDOL) 1.25 MG (50000 UNIT) CAPS capsule      FMLA paper received - filled and faxed.   Meds ordered this encounter  Medications  . Vitamin D, Ergocalciferol, (DRISDOL) 1.25 MG (50000 UNIT) CAPS capsule    Sig: Take 1 capsule (50,000 Units total) by mouth every 7 (seven) days.    Dispense:  5  capsule    Refill:  5     Linell Shawn Keith Rake, MD

## 2020-12-19 NOTE — Assessment & Plan Note (Signed)
MRI of the lumbar spine reviewed Work restrictions discussed Naproxen PRN PT

## 2020-12-19 NOTE — Telephone Encounter (Signed)
FMLA   Copied Noted Sleeved  

## 2020-12-19 NOTE — Patient Instructions (Signed)
Please start taking Vitamin D as prescribed.  Take Naproxen as needed for pain.  Please participate in physical therapy as scheduled.

## 2020-12-24 DIAGNOSIS — Z0279 Encounter for issue of other medical certificate: Secondary | ICD-10-CM

## 2020-12-24 NOTE — Telephone Encounter (Signed)
Complete

## 2020-12-25 ENCOUNTER — Other Ambulatory Visit: Payer: Self-pay

## 2020-12-25 ENCOUNTER — Ambulatory Visit (HOSPITAL_COMMUNITY): Payer: Medicaid Other | Attending: Neurosurgery

## 2020-12-25 DIAGNOSIS — M5432 Sciatica, left side: Secondary | ICD-10-CM | POA: Insufficient documentation

## 2020-12-25 DIAGNOSIS — M5440 Lumbago with sciatica, unspecified side: Secondary | ICD-10-CM | POA: Diagnosis not present

## 2020-12-25 DIAGNOSIS — M5431 Sciatica, right side: Secondary | ICD-10-CM | POA: Diagnosis present

## 2020-12-25 DIAGNOSIS — M6281 Muscle weakness (generalized): Secondary | ICD-10-CM

## 2020-12-25 DIAGNOSIS — R2689 Other abnormalities of gait and mobility: Secondary | ICD-10-CM | POA: Diagnosis present

## 2020-12-25 DIAGNOSIS — R262 Difficulty in walking, not elsewhere classified: Secondary | ICD-10-CM | POA: Diagnosis present

## 2020-12-25 NOTE — Therapy (Signed)
[place], [place], [address] Phone: [phone]   Fax:  [phone]  Physical Therapy Evaluation  Patient Details  Name: Kaylee Miller MRN: [id number] Date of Birth: 1991-04-13 Referring Provider (PT): Ashok Pall, MD   Encounter Date: 12/25/2020   PT End of Session - 12/25/20 1746    Visit Number 1    Number of Visits 12    Date for PT Re-Evaluation 02/05/21    Authorization Type [place] [employer]    Progress Note Due on Visit 10    PT Start Time 1603    PT Stop Time 1696    PT Time Calculation (min) 42 min    Activity Tolerance Patient tolerated treatment well;Patient limited by pain           Past Medical History:  Diagnosis Date  . Asthma    Phreesia 07/02/2020  . Contraceptive management 02/08/2015  . Headache(784.0)    migraines  . History of bronchitis   . HSV-2 (herpes simplex virus 2) infection   . Pregnant   . S/P C-section 07/28/2013  . Sciatic nerve pain   . Seasonal allergies     Past Surgical History:  Procedure Laterality Date  . adenoids    . CESAREAN SECTION  2007  . CESAREAN SECTION N/A 07/28/2013   Procedure: CESAREAN SECTION;  Surgeon: Jonnie Kind, MD;  Location: [facility];  Service: Obstetrics;  Laterality: N/A;  . tubes in ears      There were no vitals filed for this visit.    Subjective Assessment - 12/25/20 1611    Subjective Pt reports long hx of back pain and around end of January began experiencing bilateral LE pain from buttocks to posterior thighs. Pt has met with neurosurgeon who recommends surgery but pt was denied by insurance    How long can you sit comfortably? 2-3 hr    Diagnostic tests MRI: L5-S1 with a large right lateral recess/foraminal disc extrusion heading in the caudal  direction which contacts and impinges the descending right S1 nerve root as well as contact the exiting right L5 nerve root. There is severe right and moderate left neural  foraminal narrowing as well mild central canal stenosis    Patient Stated Goals Get back and legs feeling better    Currently in Pain? Yes    Pain Score 6     Pain Location Back    Pain Orientation Right;Left;Posterior    Pain Descriptors / Indicators Dull;Radiating    Pain Type Acute pain;Chronic pain    Pain Onset More than a month ago    Pain Frequency Intermittent    Aggravating Factors  prolongd sitting, standing, bending/stooping    Pain Relieving Factors rest              [facility] PT Assessment - 12/25/20 0001      Assessment   Medical Diagnosis M51.26 disc displacement, lumbar    Referring Provider (PT) Ashok Pall, MD      Balance Screen   Has the patient fallen in the past 6 months Yes    How many times? 1    Has the patient had a decrease in activity level because of a fear of falling?  No    Is the patient reluctant to leave their home because of a fear of falling?  No      Prior Function   Level of Independence Independent    Vocation Full time employment  Vocation Requirements bending, lifting, twisting, picking up boxes, sorting mail      Observation/Other Assessments   Observations poor lifting mechanics      Coordination   Gross Motor Movements are Fluid and Coordinated Yes    Fine Motor Movements are Fluid and Coordinated Yes      Posture/Postural Control   Posture/Postural Control Postural limitations    Postural Limitations Rounded Shoulders;Flexed trunk    Posture Comments sitting slumped or extended with BUE support to relieve back      ROM / Strength   AROM / PROM / Strength AROM;Strength      AROM   AROM Assessment Site Lumbar    Lumbar Flexion 50% limited    Lumbar Extension 10% limited    Lumbar - Right Side Bend 25% limited    Lumbar - Left Side Bend 25% limited      Strength   Overall Strength Deficits;Due to pain    Overall Strength Comments heavy reliance on BUE for positional changes      Palpation   Palpation comment  trigger points/spasms along left QL and paraspinals      Special Tests    Special Tests Lumbar    Lumbar Tests Slump Test;Straight Leg Raise      Slump test   Findings Positive    Side Right   Lt     Straight Leg Raise   Findings Positive    Side  Right   Lt                     Objective measurements completed on examination: See above findings.       [facility] Adult PT Treatment/Exercise - 12/25/20 0001      Exercises   Exercises Lumbar      Lumbar Exercises: Standing   Other Standing Lumbar Exercises standing extensions      Lumbar Exercises: Prone   Other Prone Lumbar Exercises prone lying, prone on elbows                  PT Education - 12/25/20 1746    Education Details extensive education and demonstration of lumbar disc mechancs. Education, demonstration, and performance for squat lifting. Education on sitting with lumbar support    Person(s) Educated Patient    Methods Explanation;Demonstration;Handout    Comprehension Verbalized understanding;Need further instruction            PT Short Term Goals - 12/25/20 1751      PT SHORT TERM GOAL #1   Title Patient will report at least 25% improvement in symptoms for improved quality of life.    Time 3    Period Weeks    Status New    Target Date 01/15/21      PT SHORT TERM GOAL #2   Title Patient will be independent with HEP in order to improve functional outcomes.    Time 3    Period Weeks    Status New    Target Date 01/15/21      PT SHORT TERM GOAL #3   Title Patient will report pain not exceeding 3/10 with functional lift in order to improve activity tolerance    Baseline 7/10 pain    Time 3    Period Weeks    Status New    Target Date 01/15/21             PT Long Term Goals - 12/25/20 1752      PT LONG  TERM GOAL #1   Title Patient will report at least 50% improvement in overall symptoms and function to demonstrate overall improved functional ability    Time 6    Period  Weeks    Status New    Target Date 02/05/21      PT LONG TERM GOAL #2   Title Patient will demo proper lifting form over 5 observations and with pain not exceeding 1/10 in order to improve activity tolerance    Baseline poor lifting form with 7/10 pain    Time 6    Period Weeks    Status New    Target Date 02/05/21      PT LONG TERM GOAL #3   Title Patient will demo core strength of 4/5 in order to improve trunk stability and reduce risk for re-injury    Baseline DNT due to pain/guarding    Time 6    Period Weeks    Status New    Target Date 02/05/21      PT LONG TERM GOAL #4   Title Patient will demo and report independence in self-management techniuqes to reduce peripheralization of pain in BLE    Baseline pain extends bilateral buttocks and posterior thigh above the knee    Time 6    Period Weeks    Status New    Target Date 02/05/21                  Plan - 12/25/20 1748    Clinical Impression Statement Patient is a  30 yo lady presenting to physical therapy with c/o bilateral back and BLE pain. She presents with pain limited deficits in trunk strength, ROM, endurance, postural impairments, spinal mobility and functional mobility with ADL. She is having to modify and restrict ADL as indicated by pain and functional mobility assessment as well as subjective information and objective measures which is affecting overall participation. Patient will benefit from skilled physical therapy in order to improve function and reduce impairment.    Personal Factors and Comorbidities Time since onset of injury/illness/exacerbation;Profession    Examination-Activity Limitations Bend;Caring for Others;Carry;Lift;Stand;Squat;Sit;Locomotion Level    Examination-Participation Restrictions Cleaning;Community Activity;Yard Work;Occupation    Stability/Clinical Decision Making Stable/Uncomplicated    Clinical Decision Making Low    Rehab Potential Good    PT Frequency 2x / week    PT  Duration 6 weeks    PT Treatment/Interventions ADLs/Self Care Home Management;Aquatic Therapy;Cryotherapy;Electrical Stimulation;DME Instruction;Ultrasound;Traction;Moist Heat;Gait training;Stair training;Functional mobility training;Therapeutic activities;Therapeutic exercise;Balance training;Patient/family education;Neuromuscular re-education;Manual techniques;Passive range of motion;Taping;Energy conservation;Dry needling;Spinal Manipulations;Joint Manipulations    PT Next Visit Plan assess for extension-bias, core strength as tolerated, lifting mechanics, e-stim and manual?    PT Home Exercise Plan prone, prone on elbows, standing extensions, lifting mechanics    Consulted and Agree with Plan of Care Patient           Patient will benefit from skilled therapeutic intervention in order to improve the following deficits and impairments:  Decreased activity tolerance,Decreased mobility,Decreased endurance,Decreased range of motion,Decreased strength,Difficulty walking,Increased muscle spasms,Improper body mechanics,Postural dysfunction,Pain  Visit Diagnosis: Bilateral low back pain with sciatica, sciatica laterality unspecified, unspecified chronicity  Difficulty in walking, not elsewhere classified  Muscle weakness (generalized)  Other abnormalities of gait and mobility     Problem List Patient Active Problem List   Diagnosis Date Noted  . Spinal stenosis of lumbar region with neurogenic claudication 12/19/2020  . Disc displacement, lumbar 11/12/2020  . Essential (primary) hypertension 11/12/2020  . ASCUS of  cervix with negative high risk HPV 09/21/2020  . Encounter for gynecological examination with Papanicolaou smear of cervix 09/18/2020  . Sciatica 08/30/2020  . Mild asthma without complication 32/07/2481  . Tobacco abuse 07/03/2020  . 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 Migraines 01/05/2013   5:56 PM, 12/25/20 M. Sherlyn Lees, PT, DPT Physical Therapist-  [place] Office Number: [phone]  [facility] [address] [place], [place], [address] Phone: [phone]   Fax:  [phone]  Name: Kaylee Miller MRN: [id number] Date of Birth: 26-Dec-1990

## 2020-12-31 ENCOUNTER — Other Ambulatory Visit: Payer: Self-pay

## 2020-12-31 ENCOUNTER — Ambulatory Visit (HOSPITAL_COMMUNITY): Payer: Medicaid Other | Admitting: Physical Therapy

## 2020-12-31 ENCOUNTER — Ambulatory Visit: Payer: Medicaid Other | Admitting: Internal Medicine

## 2020-12-31 DIAGNOSIS — R262 Difficulty in walking, not elsewhere classified: Secondary | ICD-10-CM

## 2020-12-31 DIAGNOSIS — R2689 Other abnormalities of gait and mobility: Secondary | ICD-10-CM

## 2020-12-31 DIAGNOSIS — M6281 Muscle weakness (generalized): Secondary | ICD-10-CM

## 2020-12-31 DIAGNOSIS — M5440 Lumbago with sciatica, unspecified side: Secondary | ICD-10-CM

## 2020-12-31 NOTE — Therapy (Signed)
[place], [place], [address] Phone: [phone]   Fax:  [phone]  Physical Therapy Treatment  Patient Details  Name: Kaylee Miller MRN: [id number] Date of Birth: [birth year] Referring Provider (PT): Ashok Pall, MD   Encounter Date: 12/31/2020   PT End of Session - 12/31/20 1552    Visit Number 2    Number of Visits 12    Date for PT Re-Evaluation 02/05/21    Authorization Type [place] Medicaid [employer]    Progress Note Due on Visit 10    PT Start Time 1448    PT Stop Time 1531    PT Time Calculation (min) 43 min    Activity Tolerance Patient tolerated treatment well;Patient limited by pain           Past Medical History:  Diagnosis Date  . Asthma    Phreesia 07/02/2020  . Contraceptive management 02/08/2015  . Headache(784.0)    migraines  . History of bronchitis   . HSV-2 (herpes simplex virus 2) infection   . Pregnant   . S/P C-section 07/28/2013  . Sciatic nerve pain   . Seasonal allergies     Past Surgical History:  Procedure Laterality Date  . adenoids    . CESAREAN SECTION  2007  . CESAREAN SECTION N/A 07/28/2013   Procedure: CESAREAN SECTION;  Surgeon: Jonnie Kind, MD;  Location: [facility];  Service: Obstetrics;  Laterality: N/A;  . tubes in ears      There were no vitals filed for this visit.   Subjective Assessment - 12/31/20 1459    Subjective Pt states she had increased pain down her Lt lateral hip and thigh the next day after thereapy but she had went strainght to work follwoing.  STates the extension is helping reduce her pain.    Currently in Pain? Yes    Pain Score 6     Pain Location Buttocks    Pain Orientation Right;Left    Pain Descriptors / Indicators Aching;Burning;Radiating                             [facility] Adult PT Treatment/Exercise - 12/31/20 0001      Lumbar Exercises: Stretches   Prone on Elbows Stretch Limitations    Prone on Elbows  Stretch Limitations 3 minutes    Press Ups 10 reps    Piriformis Stretch Right;Left;3 reps;30 seconds    Piriformis Stretch Limitations seated      Lumbar Exercises: Standing   Other Standing Lumbar Exercises standing extensions      Lumbar Exercises: Supine   Bridge 10 reps    Bridge Limitations 1/4 ROM working on isometrics      Lumbar Exercises: Prone   Straight Leg Raise 10 reps    Straight Leg Raises Limitations 1/4 ROM working on glute iso    Other Prone Lumbar Exercises heelsqueezes 10X5"                  PT Education - 12/31/20 1550    Education Details goals, HEP and POC moving forward.  LOgroll technique and core stabilations    Person(s) Educated Patient    Methods Explanation;Demonstration;Tactile cues;Verbal cues    Comprehension Verbalized understanding;Returned demonstration;Verbal cues required            PT Short Term Goals - 12/31/20 1532      PT SHORT TERM GOAL #1   Title  Patient will report at least 25% improvement in symptoms for improved quality of life.    Time 3    Period Weeks    Status On-going    Target Date 01/15/21      PT SHORT TERM GOAL #2   Title Patient will be independent with HEP in order to improve functional outcomes.    Time 3    Period Weeks    Status On-going    Target Date 01/15/21      PT SHORT TERM GOAL #3   Title Patient will report pain not exceeding 3/10 with functional lift in order to improve activity tolerance    Baseline 7/10 pain    Time 3    Period Weeks    Status On-going    Target Date 01/15/21             PT Long Term Goals - 12/31/20 1533      PT LONG TERM GOAL #1   Title Patient will report at least 50% improvement in overall symptoms and function to demonstrate overall improved functional ability    Time 6    Period Weeks    Status On-going      PT LONG TERM GOAL #2   Title Patient will demo proper lifting form over 5 observations and with pain not exceeding 1/10 in order to improve  activity tolerance    Baseline poor lifting form with 7/10 pain    Time 6    Period Weeks    Status On-going      PT LONG TERM GOAL #3   Title Patient will demo core strength of 4/5 in order to improve trunk stability and reduce risk for re-injury    Baseline DNT due to pain/guarding    Time 6    Period Weeks    Status On-going      PT LONG TERM GOAL #4   Title Patient will demo and report independence in self-management techniuqes to reduce peripheralization of pain in BLE    Baseline pain extends bilateral buttocks and posterior thigh above the knee    Time 6    Period Weeks    Status On-going                 Plan - 12/31/20 1600    Clinical Impression Statement Reviewed goals and POC moving forward.  PT able to demonstrate HEP correctly and recall all exercises given to her.  Instructed with logroll technique for bed mobility as noted sitting straight up with pain. Began new exercises targeting glutes and core mm.  Pt without any change in pain at EOS.    Personal Factors and Comorbidities Time since onset of injury/illness/exacerbation;Profession    Examination-Activity Limitations Bend;Caring for Others;Carry;Lift;Stand;Squat;Sit;Locomotion Level    Examination-Participation Restrictions Cleaning;Community Activity;Yard Work;Occupation    Stability/Clinical Decision Making Stable/Uncomplicated    Rehab Potential Good    PT Frequency 2x / week    PT Duration 6 weeks    PT Treatment/Interventions ADLs/Self Care Home Management;Aquatic Therapy;Cryotherapy;Electrical Stimulation;DME Instruction;Ultrasound;Traction;Moist Heat;Gait training;Stair training;Functional mobility training;Therapeutic activities;Therapeutic exercise;Balance training;Patient/family education;Neuromuscular re-education;Manual techniques;Passive range of motion;Taping;Energy conservation;Dry needling;Spinal Manipulations;Joint Manipulations    PT Next Visit Plan Continue with extension focus.  Progress  core strength as tolerated and begin lifting mechanics instruction next session.    PT Home Exercise Plan prone, prone on elbows, standing extensions, lifting mechanics    Consulted and Agree with Plan of Care Patient  Patient will benefit from skilled therapeutic intervention in order to improve the following deficits and impairments:  Decreased activity tolerance,Decreased mobility,Decreased endurance,Decreased range of motion,Decreased strength,Difficulty walking,Increased muscle spasms,Improper body mechanics,Postural dysfunction,Pain  Visit Diagnosis: Bilateral low back pain with sciatica, sciatica laterality unspecified, unspecified chronicity  Difficulty in walking, not elsewhere classified  Other abnormalities of gait and mobility  Muscle weakness (generalized)     Problem List Patient Active Problem List   Diagnosis Date Noted  . Spinal stenosis of lumbar region with neurogenic claudication 12/19/2020  . Disc displacement, lumbar 11/12/2020  . Essential (primary) hypertension 11/12/2020  . ASCUS of cervix with negative high risk HPV 09/21/2020  . Encounter for gynecological examination with Papanicolaou smear of cervix 09/18/2020  . Sciatica 08/30/2020  . Mild asthma without complication 41/28/7867  . Tobacco abuse 07/03/2020  . 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 Migraines 01/05/2013   Teena Irani, PTA/CLT [phone]  Teena Irani 12/31/2020, 4:01 PM  [facility] [place], [place], [address] Phone: [phone]   Fax:  [phone]  Name: Kaylee Miller MRN: [id number] Date of Birth: 22-Aug-1991

## 2021-01-02 ENCOUNTER — Ambulatory Visit (HOSPITAL_COMMUNITY): Payer: Medicaid Other | Admitting: Physical Therapy

## 2021-01-02 ENCOUNTER — Other Ambulatory Visit: Payer: Self-pay

## 2021-01-02 DIAGNOSIS — M5440 Lumbago with sciatica, unspecified side: Secondary | ICD-10-CM

## 2021-01-02 DIAGNOSIS — M6281 Muscle weakness (generalized): Secondary | ICD-10-CM

## 2021-01-02 DIAGNOSIS — R262 Difficulty in walking, not elsewhere classified: Secondary | ICD-10-CM

## 2021-01-02 DIAGNOSIS — R2689 Other abnormalities of gait and mobility: Secondary | ICD-10-CM

## 2021-01-02 NOTE — Therapy (Signed)
[place], [place], [address] Phone: [phone]   Fax:  [phone]  Physical Therapy Treatment  Patient Details  Name: Kaylee Miller MRN: [id number] Date of Birth: 09/18/1990 Referring Provider (PT): Ashok Pall, MD   Encounter Date: 01/02/2021   PT End of Session - 01/02/21 1727    Visit Number 3    Number of Visits 12    Date for PT Re-Evaluation 02/05/21    Authorization Type [place] Medicaid [employer]    Progress Note Due on Visit 10    PT Start Time 1616    PT Stop Time 1656    PT Time Calculation (min) 40 min    Activity Tolerance Patient tolerated treatment well;Patient limited by pain           Past Medical History:  Diagnosis Date  . Asthma    Phreesia 07/02/2020  . Contraceptive management 02/08/2015  . Headache(784.0)    migraines  . History of bronchitis   . HSV-2 (herpes simplex virus 2) infection   . Pregnant   . S/P C-section 07/28/2013  . Sciatic nerve pain   . Seasonal allergies     Past Surgical History:  Procedure Laterality Date  . adenoids    . CESAREAN SECTION  2007  . CESAREAN SECTION N/A 07/28/2013   Procedure: CESAREAN SECTION;  Surgeon: Jonnie Kind, MD;  Location: [facility];  Service: Obstetrics;  Laterality: N/A;  . tubes in ears      There were no vitals filed for this visit.                      [facility] Adult PT Treatment/Exercise - 01/02/21 0001      Lumbar Exercises: Stretches   Prone on Elbows Stretch Limitations    Prone on Elbows Stretch Limitations 3 minutes      Lumbar Exercises: Standing   Other Standing Lumbar Exercises standing extensions    Other Standing Lumbar Exercises UE flexion stretch 3X20", UE flexion against wall 10X      Lumbar Exercises: Supine   Bridge 15 reps      Lumbar Exercises: Sidelying   Clam Both;15 reps      Lumbar Exercises: Prone   Other Prone Lumbar Exercises heelsqueezes 20X5"                   PT Education - 01/02/21 1708    Education Details Pt with light duty forms from work to be completed; instructed her MD must do this.    Person(s) Educated Patient    Methods Explanation    Comprehension Verbalized understanding            PT Short Term Goals - 12/31/20 1532      PT SHORT TERM GOAL #1   Title Patient will report at least 25% improvement in symptoms for improved quality of life.    Time 3    Period Weeks    Status On-going    Target Date 01/15/21      PT SHORT TERM GOAL #2   Title Patient will be independent with HEP in order to improve functional outcomes.    Time 3    Period Weeks    Status On-going    Target Date 01/15/21      PT SHORT TERM GOAL #3   Title Patient will report pain not exceeding 3/10 with functional lift in order to improve activity tolerance  Baseline 7/10 pain    Time 3    Period Weeks    Status On-going    Target Date 01/15/21             PT Long Term Goals - 12/31/20 1533      PT LONG TERM GOAL #1   Title Patient will report at least 50% improvement in overall symptoms and function to demonstrate overall improved functional ability    Time 6    Period Weeks    Status On-going      PT LONG TERM GOAL #2   Title Patient will demo proper lifting form over 5 observations and with pain not exceeding 1/10 in order to improve activity tolerance    Baseline poor lifting form with 7/10 pain    Time 6    Period Weeks    Status On-going      PT LONG TERM GOAL #3   Title Patient will demo core strength of 4/5 in order to improve trunk stability and reduce risk for re-injury    Baseline DNT due to pain/guarding    Time 6    Period Weeks    Status On-going      PT LONG TERM GOAL #4   Title Patient will demo and report independence in self-management techniuqes to reduce peripheralization of pain in BLE    Baseline pain extends bilateral buttocks and posterior thigh above the knee    Time 6    Period Weeks     Status On-going                 Plan - 01/02/21 1719    Clinical Impression Statement Pt with increased Rt lateral hip pain today pointing to hip joint and unsure why it is up today.  Continued with therex with pt reporting she completes the extensions and seated stretch at work and it helps.  Began supine hip adductor squeeze and sidelying hip strengthening exercises.  Pt reported mild pain with these activities that decreased when instructed to complete in  ROM.   Added standing trunk stretch and UE flexion against wall to work on postural strength.  Pt reported reduced pain at EOS.    Personal Factors and Comorbidities Time since onset of injury/illness/exacerbation;Profession    Examination-Activity Limitations Bend;Caring for Others;Carry;Lift;Stand;Squat;Sit;Locomotion Level    Examination-Participation Restrictions Cleaning;Community Activity;Yard Work;Occupation    Stability/Clinical Decision Making Stable/Uncomplicated    Rehab Potential Good    PT Frequency 2x / week    PT Duration 6 weeks    PT Treatment/Interventions ADLs/Self Care Home Management;Aquatic Therapy;Cryotherapy;Electrical Stimulation;DME Instruction;Ultrasound;Traction;Moist Heat;Gait training;Stair training;Functional mobility training;Therapeutic activities;Therapeutic exercise;Balance training;Patient/family education;Neuromuscular re-education;Manual techniques;Passive range of motion;Taping;Energy conservation;Dry needling;Spinal Manipulations;Joint Manipulations    PT Next Visit Plan Continue with extension focus.  Progress core strength as tolerated and begin lifting mechanics instruction next session.    PT Home Exercise Plan prone, prone on elbows, standing extensions, lifting mechanics    Consulted and Agree with Plan of Care Patient           Patient will benefit from skilled therapeutic intervention in order to improve the following deficits and impairments:  Decreased activity  tolerance,Decreased mobility,Decreased endurance,Decreased range of motion,Decreased strength,Difficulty walking,Increased muscle spasms,Improper body mechanics,Postural dysfunction,Pain  Visit Diagnosis: Difficulty in walking, not elsewhere classified  Other abnormalities of gait and mobility  Muscle weakness (generalized)  Bilateral low back pain with sciatica, sciatica laterality unspecified, unspecified chronicity     Problem List Patient Active Problem List  Diagnosis Date Noted  . Spinal stenosis of lumbar region with neurogenic claudication 12/19/2020  . Disc displacement, lumbar 11/12/2020  . Essential (primary) hypertension 11/12/2020  . ASCUS of cervix with negative high risk HPV 09/21/2020  . Encounter for gynecological examination with Papanicolaou smear of cervix 09/18/2020  . Sciatica 08/30/2020  . Mild asthma without complication 25/00/3704  . Tobacco abuse 07/03/2020  . 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 Migraines 01/05/2013   Teena Irani, PTA/CLT [phone]  Teena Irani 01/02/2021, 5:28 PM  [facility] [address] [place], [place], [address] Phone: [phone]   Fax:  [phone]  Name: Kaylee Miller MRN: [id number] Date of Birth: 07-05-1991

## 2021-01-07 ENCOUNTER — Ambulatory Visit (HOSPITAL_COMMUNITY): Payer: Medicaid Other

## 2021-01-07 ENCOUNTER — Other Ambulatory Visit: Payer: Self-pay

## 2021-01-07 ENCOUNTER — Ambulatory Visit: Payer: Medicaid Other | Admitting: Internal Medicine

## 2021-01-07 ENCOUNTER — Telehealth: Payer: Self-pay

## 2021-01-07 DIAGNOSIS — M6281 Muscle weakness (generalized): Secondary | ICD-10-CM

## 2021-01-07 DIAGNOSIS — R262 Difficulty in walking, not elsewhere classified: Secondary | ICD-10-CM

## 2021-01-07 DIAGNOSIS — R2689 Other abnormalities of gait and mobility: Secondary | ICD-10-CM

## 2021-01-07 DIAGNOSIS — M5440 Lumbago with sciatica, unspecified side: Secondary | ICD-10-CM

## 2021-01-07 NOTE — Telephone Encounter (Signed)
FMLA   COPIED NOTED SLEEVED 

## 2021-01-07 NOTE — Therapy (Signed)
[place], [place], [address] Phone: [phone]   Fax:  [phone]  Physical Therapy Treatment  Patient Details  Name: Kaylee Miller MRN: [id number] Date of Birth: Mar 29, 1991 Referring Provider (PT): Ashok Pall, MD   Encounter Date: 01/07/2021   PT End of Session - 01/07/21 0946    Visit Number 4    Number of Visits 12    Date for PT Re-Evaluation 02/05/21    Authorization Type [place] [employer]    Progress Note Due on Visit 10    PT Start Time 0947    PT Stop Time 1030    PT Time Calculation (min) 43 min    Activity Tolerance Patient tolerated treatment well;Patient limited by pain           Past Medical History:  Diagnosis Date  . Asthma    Phreesia 07/02/2020  . Contraceptive management 02/08/2015  . Headache(784.0)    migraines  . History of bronchitis   . HSV-2 (herpes simplex virus 2) infection   . Pregnant   . S/P C-section 07/28/2013  . Sciatic nerve pain   . Seasonal allergies     Past Surgical History:  Procedure Laterality Date  . adenoids    . CESAREAN SECTION  2007  . CESAREAN SECTION N/A 07/28/2013   Procedure: CESAREAN SECTION;  Surgeon: Jonnie Kind, MD;  Location: [facility];  Service: Obstetrics;  Laterality: N/A;  . tubes in ears      There were no vitals filed for this visit.   Subjective Assessment - 01/07/21 0952    Subjective Pt reports currently most of the pain is in her central low back but she hasn't done much today.  Notes she was at work yesterday and felt increase in back pain and bilateral buttocks/thighs and reports she spent most of her shift in standing. Pt also notes she went to mow her father's lawn using a riding mower and feels very sore and inflamed today    Diagnostic tests MRI: L5-S1 with a large right lateral recess/foraminal disc extrusion heading in the caudal  direction which contacts and impinges the descending right S1 nerve root as well  as contact the exiting right L5 nerve root. There is severe right and moderate left neural foraminal narrowing as well mild central canal stenosis    Currently in Pain? Yes    Pain Score 9     Pain Location Back    Pain Orientation Lower    Pain Descriptors / Indicators Aching;Burning;Radiating              [facility] PT Assessment - 01/07/21 0001      Assessment   Medical Diagnosis M51.26 disc displacement, lumbar    Referring Provider (PT) Ashok Pall, MD                         [facility] Adult PT Treatment/Exercise - 01/07/21 0001      Lumbar Exercises: Stretches   Quad Stretch Right;Left;2 reps;60 seconds      Lumbar Exercises: Prone   Other Prone Lumbar Exercises prone lying, prone on elbows 15x.      Lumbar Exercises: Quadruped   [place]/Old Horse 10 reps      Manual Therapy   Manual Therapy Joint mobilization;Soft tissue mobilization    Manual therapy comments completed separate from all other interventions    Joint Mobilization Grade 2 and 3 PA l-spine with oscillations  for pain control and grade 3 coupled with prone on elbows position to induce l-spine extension    Soft tissue mobilization massage gun x 5 min to lumbar/piriformis                  PT Education - 01/07/21 1023    Education Details education on continued POC details    Person(s) Educated Patient    Methods Explanation    Comprehension Verbalized understanding            PT Short Term Goals - 12/31/20 1532      PT SHORT TERM GOAL #1   Title Patient will report at least 25% improvement in symptoms for improved quality of life.    Time 3    Period Weeks    Status On-going    Target Date 01/15/21      PT SHORT TERM GOAL #2   Title Patient will be independent with HEP in order to improve functional outcomes.    Time 3    Period Weeks    Status On-going    Target Date 01/15/21      PT SHORT TERM GOAL #3   Title Patient will report pain not exceeding 3/10 with functional  lift in order to improve activity tolerance    Baseline 7/10 pain    Time 3    Period Weeks    Status On-going    Target Date 01/15/21             PT Long Term Goals - 12/31/20 1533      PT LONG TERM GOAL #1   Title Patient will report at least 50% improvement in overall symptoms and function to demonstrate overall improved functional ability    Time 6    Period Weeks    Status On-going      PT LONG TERM GOAL #2   Title Patient will demo proper lifting form over 5 observations and with pain not exceeding 1/10 in order to improve activity tolerance    Baseline poor lifting form with 7/10 pain    Time 6    Period Weeks    Status On-going      PT LONG TERM GOAL #3   Title Patient will demo core strength of 4/5 in order to improve trunk stability and reduce risk for re-injury    Baseline DNT due to pain/guarding    Time 6    Period Weeks    Status On-going      PT LONG TERM GOAL #4   Title Patient will demo and report independence in self-management techniuqes to reduce peripheralization of pain in BLE    Baseline pain extends bilateral buttocks and posterior thigh above the knee    Time 6    Period Weeks    Status On-going                 Plan - 01/07/21 1026    Clinical Impression Statement Tolerating extension-biased activities better than flexion-bias.  Able to tolerate increased prone based activities today and tolerated STM and jt mobilization well and reports decrease pain at end of session of 5/10.  Continued POC indicated to improve trunk ROM and strength to minimize pain in BLE and low back    Personal Factors and Comorbidities Time since onset of injury/illness/exacerbation;Profession    Examination-Activity Limitations Bend;Caring for Others;Carry;Lift;Stand;Squat;Sit;Locomotion Level    Examination-Participation Restrictions Cleaning;Community Activity;Yard Work;Occupation    Stability/Clinical Decision Making Stable/Uncomplicated    Rehab Potential  Good    PT Frequency 2x / week    PT Duration 6 weeks    PT Treatment/Interventions ADLs/Self Care Home Management;Aquatic Therapy;Cryotherapy;Electrical Stimulation;DME Instruction;Ultrasound;Traction;Moist Heat;Gait training;Stair training;Functional mobility training;Therapeutic activities;Therapeutic exercise;Balance training;Patient/family education;Neuromuscular re-education;Manual techniques;Passive range of motion;Taping;Energy conservation;Dry needling;Spinal Manipulations;Joint Manipulations    PT Next Visit Plan Continue with extension focus.  Progress core strength as tolerated and begin lifting mechanics instruction next session.    PT Home Exercise Plan prone, prone on elbows, standing extensions, lifting mechanics    Consulted and Agree with Plan of Care Patient           Patient will benefit from skilled therapeutic intervention in order to improve the following deficits and impairments:  Decreased activity tolerance,Decreased mobility,Decreased endurance,Decreased range of motion,Decreased strength,Difficulty walking,Increased muscle spasms,Improper body mechanics,Postural dysfunction,Pain  Visit Diagnosis: Difficulty in walking, not elsewhere classified  Other abnormalities of gait and mobility  Muscle weakness (generalized)  Bilateral low back pain with sciatica, sciatica laterality unspecified, unspecified chronicity     Problem List Patient Active Problem List   Diagnosis Date Noted  . Spinal stenosis of lumbar region with neurogenic claudication 12/19/2020  . Disc displacement, lumbar 11/12/2020  . Essential (primary) hypertension 11/12/2020  . ASCUS of cervix with negative high risk HPV 09/21/2020  . Encounter for gynecological examination with Papanicolaou smear of cervix 09/18/2020  . Sciatica 08/30/2020  . Mild asthma without complication 00/93/8182  . Tobacco abuse 07/03/2020  . 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 Migraines 01/05/2013    10:39 AM, 01/07/21 M. Sherlyn Lees, PT, DPT Physical Therapist- [place] Office Number: [phone]  [facility] [address] [place], [place], [address] Phone: [phone]   Fax:  [phone]  Name: Kaylee Miller MRN: [id number] Date of Birth: 1991-06-11

## 2021-01-09 ENCOUNTER — Other Ambulatory Visit: Payer: Self-pay

## 2021-01-09 ENCOUNTER — Ambulatory Visit (INDEPENDENT_AMBULATORY_CARE_PROVIDER_SITE_OTHER): Payer: Medicaid Other | Admitting: Obstetrics and Gynecology

## 2021-01-09 ENCOUNTER — Ambulatory Visit (HOSPITAL_COMMUNITY): Payer: Medicaid Other | Admitting: Physical Therapy

## 2021-01-09 ENCOUNTER — Encounter (HOSPITAL_COMMUNITY): Payer: Self-pay | Admitting: Physical Therapy

## 2021-01-09 ENCOUNTER — Other Ambulatory Visit (HOSPITAL_COMMUNITY)
Admission: RE | Admit: 2021-01-09 | Discharge: 2021-01-09 | Disposition: A | Discharge status: Home / Self Care | Payer: Medicaid Other | Source: Ambulatory Visit | Attending: Obstetrics and Gynecology | Admitting: Obstetrics and Gynecology

## 2021-01-09 ENCOUNTER — Encounter: Payer: Self-pay | Admitting: Obstetrics and Gynecology

## 2021-01-09 VITALS — BP 116/70 | HR 83 | Ht 67.0 in | Wt 189.2 lb

## 2021-01-09 DIAGNOSIS — R350 Frequency of micturition: Secondary | ICD-10-CM | POA: Diagnosis not present

## 2021-01-09 DIAGNOSIS — M5440 Lumbago with sciatica, unspecified side: Secondary | ICD-10-CM

## 2021-01-09 DIAGNOSIS — R262 Difficulty in walking, not elsewhere classified: Secondary | ICD-10-CM

## 2021-01-09 DIAGNOSIS — R2689 Other abnormalities of gait and mobility: Secondary | ICD-10-CM

## 2021-01-09 DIAGNOSIS — M6281 Muscle weakness (generalized): Secondary | ICD-10-CM

## 2021-01-09 DIAGNOSIS — N898 Other specified noninflammatory disorders of vagina: Secondary | ICD-10-CM | POA: Diagnosis not present

## 2021-01-09 DIAGNOSIS — R399 Unspecified symptoms and signs involving the genitourinary system: Secondary | ICD-10-CM | POA: Diagnosis not present

## 2021-01-09 LAB — POCT URINALYSIS DIPSTICK OB
Blood, UA: NEGATIVE
Glucose, UA: NEGATIVE
Ketones, UA: NEGATIVE
Leukocytes, UA: NEGATIVE
Nitrite, UA: NEGATIVE
POC,PROTEIN,UA: NEGATIVE

## 2021-01-09 NOTE — Therapy (Signed)
[place], [place], [address] Phone: [phone]   Fax:  [phone]  Physical Therapy Treatment  Patient Details  Name: Kaylee Miller MRN: [id number] Date of Birth: 1991/03/26 Referring Provider (PT): Ashok Pall, MD   Encounter Date: 01/09/2021   PT End of Session - 01/09/21 1726    Visit Number 5    Number of Visits 12    Date for PT Re-Evaluation 02/05/21    Authorization Type [place] [employer]    Authorization Time Period 5/4-6/14    Authorization - Visit Number 5    Authorization - Number of Visits 12    Progress Note Due on Visit 10    PT Start Time 1722    PT Stop Time 1810    PT Time Calculation (min) 48 min    Activity Tolerance Patient tolerated treatment well    Behavior During Therapy [facility] for tasks assessed/performed           Past Medical History:  Diagnosis Date  . Asthma    Phreesia 07/02/2020  . Contraceptive management 02/08/2015  . Headache(784.0)    migraines  . History of bronchitis   . HSV-2 (herpes simplex virus 2) infection   . Pregnant   . S/P C-section 07/28/2013  . Sciatic nerve pain   . Seasonal allergies     Past Surgical History:  Procedure Laterality Date  . adenoids    . CESAREAN SECTION  2007  . CESAREAN SECTION N/A 07/28/2013   Procedure: CESAREAN SECTION;  Surgeon: Jonnie Kind, MD;  Location: [facility];  Service: Obstetrics;  Laterality: N/A;  . tubes in ears      There were no vitals filed for this visit.   Subjective Assessment - 01/09/21 1725    Subjective Patient says she just got out of work so her back is hurting a little right now.    Diagnostic tests MRI: L5-S1 with a large right lateral recess/foraminal disc extrusion heading in the caudal  direction which contacts and impinges the descending right S1 nerve root as well as contact the exiting right L5 nerve root. There is severe right and moderate left neural foraminal narrowing as well  mild central canal stenosis    Currently in Pain? Yes    Pain Score 8     Pain Location Back    Pain Orientation Lower    Pain Descriptors / Indicators Aching;Burning    Pain Type Acute pain;Chronic pain    Pain Onset More than a month ago    Pain Frequency Intermittent                             [facility] Adult PT Treatment/Exercise - 01/09/21 0001      Lumbar Exercises: Supine   Ab Set 10 reps    Bent Knee Raise 20 reps    Bridge 20 reps    Straight Leg Raise 20 reps      Lumbar Exercises: Sidelying   Other Sidelying Lumbar Exercises modified side plank 3 x 10"      Lumbar Exercises: Prone   Other Prone Lumbar Exercises prone on elbows 3 min > prone press ups 2 x 10 (reduced)      Manual Therapy   Manual Therapy Soft tissue mobilization    Manual therapy comments completed separate from all other interventions    Soft tissue mobilization massage gun lv 10 to bilateral  lumbar/piriformis                    PT Short Term Goals - 12/31/20 1532      PT SHORT TERM GOAL #1   Title Patient will report at least 25% improvement in symptoms for improved quality of life.    Time 3    Period Weeks    Status On-going    Target Date 01/15/21      PT SHORT TERM GOAL #2   Title Patient will be independent with HEP in order to improve functional outcomes.    Time 3    Period Weeks    Status On-going    Target Date 01/15/21      PT SHORT TERM GOAL #3   Title Patient will report pain not exceeding 3/10 with functional lift in order to improve activity tolerance    Baseline 7/10 pain    Time 3    Period Weeks    Status On-going    Target Date 01/15/21             PT Long Term Goals - 12/31/20 1533      PT LONG TERM GOAL #1   Title Patient will report at least 50% improvement in overall symptoms and function to demonstrate overall improved functional ability    Time 6    Period Weeks    Status On-going      PT LONG TERM GOAL #2   Title  Patient will demo proper lifting form over 5 observations and with pain not exceeding 1/10 in order to improve activity tolerance    Baseline poor lifting form with 7/10 pain    Time 6    Period Weeks    Status On-going      PT LONG TERM GOAL #3   Title Patient will demo core strength of 4/5 in order to improve trunk stability and reduce risk for re-injury    Baseline DNT due to pain/guarding    Time 6    Period Weeks    Status On-going      PT LONG TERM GOAL #4   Title Patient will demo and report independence in self-management techniuqes to reduce peripheralization of pain in BLE    Baseline pain extends bilateral buttocks and posterior thigh above the knee    Time 6    Period Weeks    Status On-going                 Plan - 01/09/21 1818    Clinical Impression Statement Patient tolerated session well overall today. Able to progress core strengthening and lumbar extension-based activity. Patient noted decreased pain with prone on elbows so progressed to prone press up. Patient educated on purpose and mechanics. Patient reports pain reduction from 8/10 to 4/10. Added modified side plank for core strength. Patient was well challenge with this. Updated HEP handout. Patient will continue to benefit from core strength and extension-based progressions for reduced low back pain and improved functional ability.    Personal Factors and Comorbidities Time since onset of injury/illness/exacerbation;Profession    Examination-Activity Limitations Bend;Caring for Others;Carry;Lift;Stand;Squat;Sit;Locomotion Level    Examination-Participation Restrictions Cleaning;Community Activity;Yard Work;Occupation    Stability/Clinical Decision Making Stable/Uncomplicated    Rehab Potential Good    PT Frequency 2x / week    PT Duration 6 weeks    PT Treatment/Interventions ADLs/Self Care Home Management;Aquatic Therapy;Cryotherapy;Electrical Stimulation;DME Instruction;Ultrasound;Traction;Moist  Heat;Gait training;Stair training;Functional mobility training;Therapeutic activities;Therapeutic exercise;Balance training;Patient/family education;Neuromuscular re-education;Manual techniques;Passive  range of motion;Taping;Energy conservation;Dry needling;Spinal Manipulations;Joint Manipulations    PT Next Visit Plan Continue with extension focus.  Progress core strength as tolerated and begin lifting mechanics instruction next session.    PT Home Exercise Plan prone, prone on elbows, standing extensions, lifting mechanics, ab set, side plank, prone press up    Consulted and Agree with Plan of Care Patient           Patient will benefit from skilled therapeutic intervention in order to improve the following deficits and impairments:  Decreased activity tolerance,Decreased mobility,Decreased endurance,Decreased range of motion,Decreased strength,Difficulty walking,Increased muscle spasms,Improper body mechanics,Postural dysfunction,Pain  Visit Diagnosis: Difficulty in walking, not elsewhere classified  Other abnormalities of gait and mobility  Muscle weakness (generalized)  Bilateral low back pain with sciatica, sciatica laterality unspecified, unspecified chronicity     Problem List Patient Active Problem List   Diagnosis Date Noted  . Vaginal irritation 01/09/2021  . Urinary frequency 01/09/2021  . Spinal stenosis of lumbar region with neurogenic claudication 12/19/2020  . Disc displacement, lumbar 11/12/2020  . Essential (primary) hypertension 11/12/2020  . ASCUS of cervix with negative high risk HPV 09/21/2020  . Encounter for gynecological examination with Papanicolaou smear of cervix 09/18/2020  . Sciatica 08/30/2020  . Mild asthma without complication 16/05/9603  . Tobacco abuse 07/03/2020  . Migraines 01/05/2013   6:20 PM, 01/09/21 Josue Hector PT DPT  Physical Therapist with [facility]  [phone] [place] [address] [place], [place], [address] Phone: [phone]   Fax:  [phone]  Name: Kaylee Miller MRN: [id number] Date of Birth: 1991-04-30

## 2021-01-09 NOTE — Patient Instructions (Signed)
Access Code: [email or web address] URL: [email or web address] Date: 01/09/2021 Prepared by: Josue Hector  Exercises Side Plank on Knees - 1-2 x daily - 7 x weekly - 1 sets - 4 reps - 15 second hold Prone Press Up - 3 x daily - 7 x weekly - 2 sets - 10 reps Supine Transversus Abdominis Bracing - Hands on Stomach - 3 x daily - 7 x weekly - 2 sets - 10 reps - 5 second hold

## 2021-01-09 NOTE — Progress Notes (Signed)
Kaylee Miller presents for evaluation of yeast infection and UTI. Pt has noted increased urinary and some low back pain for the last 3 days. No fever or chills. Has increase water intake for health purposes. Also has H/O spinal stenosis and sees PT for this  She also recently was prescribed antibiotics for a toot abscess. She has noted some vaginal irration  She has tired OTC meds for both Sx and has not noted in improvement  Depo Provera due next month  PE AF VSS Lungs clear Heart RRR Abd soft + BS Back no CVA tenderness GU vaginal swab collected  UA normal but pt on Azo  A/P Increased urinary freq        Vaginal irration  Will check UC and treat according to results. Await vaginal swab results. F/U PRN

## 2021-01-10 ENCOUNTER — Telehealth: Payer: Self-pay | Admitting: Obstetrics and Gynecology

## 2021-01-10 NOTE — Telephone Encounter (Signed)
Patient wants to discuss test results °

## 2021-01-10 NOTE — Telephone Encounter (Signed)
Returned pt's call. Pt didn't understand why she was still having UTI symptoms when her dispstick was all negative. Explained that even though it was negative, her urine was sent off for a culture which will be more definitive and that takes several days to result. Pt encouraged to take OTC pain meds and drink lots of water. Pt confirmed understanding.

## 2021-01-11 ENCOUNTER — Other Ambulatory Visit: Payer: Self-pay

## 2021-01-11 ENCOUNTER — Ambulatory Visit (HOSPITAL_COMMUNITY)
Admission: EM | Admit: 2021-01-11 | Discharge: 2021-01-11 | Disposition: A | Discharge status: Home / Self Care | Payer: Medicaid Other | Attending: Internal Medicine | Admitting: Internal Medicine

## 2021-01-11 ENCOUNTER — Encounter (HOSPITAL_COMMUNITY): Payer: Self-pay | Admitting: Physician Assistant

## 2021-01-11 DIAGNOSIS — R110 Nausea: Secondary | ICD-10-CM | POA: Diagnosis not present

## 2021-01-11 DIAGNOSIS — H8110 Benign paroxysmal vertigo, unspecified ear: Secondary | ICD-10-CM

## 2021-01-11 LAB — POCT URINALYSIS DIPSTICK, ED / UC
Bilirubin Urine: NEGATIVE
Glucose, UA: NEGATIVE mg/dL
Hgb urine dipstick: NEGATIVE
Leukocytes,Ua: NEGATIVE
Nitrite: NEGATIVE
Protein, ur: NEGATIVE mg/dL
Specific Gravity, Urine: 1.025 (ref 1.005–1.030)
Urobilinogen, UA: 0.2 mg/dL (ref 0.0–1.0)
pH: 6.5 (ref 5.0–8.0)

## 2021-01-11 LAB — CERVICOVAGINAL ANCILLARY ONLY
Bacterial Vaginitis (gardnerella): NEGATIVE
Candida Glabrata: NEGATIVE
Candida Vaginitis: POSITIVE — AB
Comment: NEGATIVE
Comment: NEGATIVE
Comment: NEGATIVE

## 2021-01-11 LAB — URINE CULTURE

## 2021-01-11 LAB — POC URINE PREG, ED: Preg Test, Ur: NEGATIVE

## 2021-01-11 MED ORDER — MECLIZINE HCL 25 MG PO TABS: 25.0000 mg | ORAL_TABLET | Freq: Three times a day (TID) | ORAL | 0 refills | Status: DC | PRN

## 2021-01-11 MED ORDER — ONDANSETRON 4 MG PO TBDP: 4.0000 mg | ORAL_TABLET | Freq: Three times a day (TID) | ORAL | 0 refills | Status: DC | PRN

## 2021-01-11 NOTE — Discharge Instructions (Addendum)
Take meclizine 3 times a day to help with vertigo symptoms.  Use Zofran under your tongue for nausea.  Make sure you drink plenty of fluid and rest.  Please have someone help you ambulate to prevent a fall.  If your symptoms are not improving please go to the ENT as we discussed.  If anything worsens including vomiting, weakness, headache, fever you need to go to the emergency room as we discussed.

## 2021-01-11 NOTE — ED Triage Notes (Signed)
Pt works at [employer] and got sick at work today. Pt reporting N/V. Pt also feels dizzy.

## 2021-01-11 NOTE — ED Provider Notes (Signed)
[facility]    CSN: [id number] Arrival date & time: 01/11/21  1348      History   Chief Complaint Chief Complaint  Patient presents with  . Emesis  . Dizziness    HPI Kaylee Miller is a 30 y.o. female.   Patient presents today with a several hour history of vertigo.  Reports a sensation of the room is spinning.  This occurred when she went to stand up and start her lunch while at work.  She denies any medication changes or head injury.  Her only additional symptom is nausea.  Denies any vomiting, weakness, dysarthria, headache, lightheadedness, syncope, chest pain, shortness of breath.  She denies any medication changes.  She denies episodes of similar symptoms in the past.  States symptoms are worse with changing position overhead or eyes.  She feels a sense of disequilibrium and has been concerned to stand as she feels she will potentially fall.  She left work for evaluation given symptoms and mother drove him to appointment today.  She does have a history of migraines but denies additional neurological condition.  She denies any recent illness or congestion symptoms.  She has not seen an ENT in the past.     Past Medical History:  Diagnosis Date  . Asthma    Phreesia 07/02/2020  . Contraceptive management 02/08/2015  . Headache(784.0)    migraines  . History of bronchitis   . HSV-2 (herpes simplex virus 2) infection   . Pregnant   . S/P C-section 07/28/2013  . Sciatic nerve pain   . Seasonal allergies     Patient Active Problem List   Diagnosis Date Noted  . Vaginal irritation 01/09/2021  . Urinary frequency 01/09/2021  . Spinal stenosis of lumbar region with neurogenic claudication 12/19/2020  . Disc displacement, lumbar 11/12/2020  . Essential (primary) hypertension 11/12/2020  . ASCUS of cervix with negative high risk HPV 09/21/2020  . Encounter for gynecological examination with Papanicolaou smear of cervix 09/18/2020  . Sciatica 08/30/2020  .  Mild asthma without complication 55/97/4163  . Tobacco abuse 07/03/2020  . Migraines 01/05/2013    Past Surgical History:  Procedure Laterality Date  . adenoids    . CESAREAN SECTION  2007  . CESAREAN SECTION N/A 07/28/2013   Procedure: CESAREAN SECTION;  Surgeon: Jonnie Kind, MD;  Location: [facility];  Service: Obstetrics;  Laterality: N/A;  . tubes in ears      OB History    Gravida  2   Para  2   Term  2   Preterm      AB      Living  2     SAB      IAB      Ectopic      Multiple      Live Births  2            Home Medications    Prior to Admission medications   Medication Sig Start Date End Date Taking? Authorizing Provider  meclizine (ANTIVERT) 25 MG tablet Take 1 tablet (25 mg total) by mouth 3 (three) times daily as needed for dizziness. 01/11/21  Yes Nyala Kirchner K, PA-C  ondansetron (ZOFRAN ODT) 4 MG disintegrating tablet Take 1 tablet (4 mg total) by mouth every 8 (eight) hours as needed for nausea or vomiting. 01/11/21  Yes Taron Conrey K, PA-C  acetaminophen (TYLENOL) 500 MG tablet Take 1,500 mg every 6 (six) hours as needed by mouth for  moderate pain or headache.    [provider]  albuterol (VENTOLIN HFA) 108 (90 Base) MCG/ACT inhaler Inhale 2 puffs into the lungs every 6 (six) hours as needed for wheezing or shortness of breath. 06/10/20   Rolland Porter, MD  Aspirin-Acetaminophen-Caffeine (EXCEDRIN MIGRAINE PO) Take by mouth.    [provider]  chlorhexidine (PERIDEX) 0.12 % solution Use as directed 15 mLs in the mouth or throat 2 (two) times daily. 10/06/20   Avegno, Darrelyn Hillock, FNP  hydrocortisone (ANUSOL-HC) 2.5 % rectal cream Place 1 application rectally 2 (two) times daily. 07/11/20   Lindell Spar, MD  ibuprofen (ADVIL) 200 MG tablet Take 800 mg by mouth as needed for cramping (taking 4, 200 mg tablets every 8 hours as needed).    [provider]  medroxyPROGESTERone Acetate 150 MG/ML SUSY INJECT 1 ML INTO THE  MUSCLE EVERY 3 MONTHS 09/17/20   Derrek Monaco A, NP  naproxen (NAPROSYN) 500 MG tablet Take 1 tablet (500 mg total) by mouth 2 (two) times daily. 10/06/20   Avegno, Darrelyn Hillock, FNP  tiZANidine (ZANAFLEX) 4 MG tablet Take 1 tablet (4 mg total) by mouth every 6 (six) hours as needed for muscle spasms. 11/06/20   Sanjuana Kava, MD  Vitamin D, Ergocalciferol, (DRISDOL) 1.25 MG (50000 UNIT) CAPS capsule Take 1 capsule (50,000 Units total) by mouth every 7 (seven) days. 12/19/20   Lindell Spar, MD    Family History Family History  Problem Relation Age of Onset  . Cancer Mother   . Breast cancer Mother   . Hypertension Father   . Stroke Father   . CAD Maternal Grandfather   . CAD Paternal Grandmother   . CAD Paternal Grandfather   . Diabetes Other        great-great grandmother  . CAD Other     Social History Social History   Tobacco Use  . Smoking status: Current Some Day Smoker    Packs/day: 1.00    Years: 9.00    Pack years: 9.00    Types: Cigarettes  . Smokeless tobacco: Never Used  Vaping Use  . Vaping Use: Never used  Substance Use Topics  . Alcohol use: Yes    Alcohol/week: 0.0 standard drinks  . Drug use: Never     Allergies   Latex   Review of Systems Review of Systems  Constitutional: Positive for activity change and appetite change. Negative for fatigue and fever.  HENT: Negative for congestion, ear pain, hearing loss, sinus pressure, sneezing and sore throat.   Respiratory: Negative for cough and shortness of breath.   Cardiovascular: Negative for chest pain.  Gastrointestinal: Positive for nausea. Negative for abdominal pain, diarrhea and vomiting.  Musculoskeletal: Negative for arthralgias and myalgias.  Neurological: Positive for dizziness. Negative for weakness, light-headedness and headaches.     Physical Exam Triage Vital Signs ED Triage Vitals  Enc Vitals Group     BP 01/11/21 1408 106/65     Pulse Rate 01/11/21 1408 77     Resp 01/11/21  1408 18     Temp 01/11/21 1408 98.9 F (37.2 C)     Temp src --      SpO2 01/11/21 1408 100 %     Weight --      Height --      Head Circumference --      Peak Flow --      Pain Score 01/11/21 1411 0     Pain Loc --  Pain Edu? --      Excl. in [place]? --    No data found.  Updated Vital Signs BP 106/65   Pulse 77   Temp 98.9 F (37.2 C)   Resp 18   LMP  (LMP Unknown)   SpO2 100%   Visual Acuity Right Eye Distance:   Left Eye Distance:   Bilateral Distance:    Right Eye Near:   Left Eye Near:    Bilateral Near:     Physical Exam Vitals reviewed.  Constitutional:      General: She is awake. She is not in acute distress.    Appearance: Normal appearance. She is not ill-appearing.     Comments: Very pleasant female appears at age in no acute distress sitting comfortably in wheelchair  HENT:     Head: Normocephalic and atraumatic. No raccoon eyes, Battle's sign or contusion.     Right Ear: Tympanic membrane, ear canal and external ear normal. No hemotympanum. Tympanic membrane is not erythematous or bulging.     Left Ear: Tympanic membrane, ear canal and external ear normal. No hemotympanum. Tympanic membrane is not erythematous or bulging.     Nose: Nose normal.     Mouth/Throat:     Tongue: Tongue does not deviate from midline.     Pharynx: Uvula midline. No oropharyngeal exudate or posterior oropharyngeal erythema.  Eyes:     Extraocular Movements: Extraocular movements intact.     Pupils: Pupils are equal, round, and reactive to light.  Cardiovascular:     Rate and Rhythm: Normal rate and regular rhythm.     Heart sounds: No murmur heard.   Pulmonary:     Effort: Pulmonary effort is normal.     Breath sounds: Normal breath sounds. No wheezing, rhonchi or rales.     Comments: Clear to auscultation bilaterally Abdominal:     Palpations: Abdomen is soft.     Tenderness: There is no abdominal tenderness.  Musculoskeletal:     Cervical back: No spinous  process tenderness or muscular tenderness.     Comments: Strength 5/5 bilateral upper and lower extremities  Lymphadenopathy:     Head:     Right side of head: No submental, submandibular or tonsillar adenopathy.     Left side of head: No submental, submandibular or tonsillar adenopathy.  Neurological:     General: No focal deficit present.     Cranial Nerves: Cranial nerves are intact.     Motor: Motor function is intact.     Coordination: Coordination is intact.     Gait: Gait is intact.     Comments: Cranial nerves II through XII intact.  No focal neurological defect on exam  Psychiatric:        Behavior: Behavior is cooperative.      UC Treatments / Results  Labs (all labs ordered are listed, but only abnormal results are displayed) Labs Reviewed  POCT URINALYSIS DIPSTICK, ED / UC - Abnormal; Notable for the following components:      Result Value   Ketones, ur TRACE (*)    All other components within normal limits  POC URINE PREG, ED    EKG   Radiology No results found.  Procedures Procedures (including critical care time)  Medications Ordered in UC Medications - No data to display  Initial Impression / Assessment and Plan / UC Course  I have reviewed the triage vital signs and the nursing notes.  Pertinent labs & imaging results that were available  during my care of the patient were reviewed by me and considered in my medical decision making (see chart for details).     Vital signs and physical exam reassuring today; no indication for emergent evaluation or imaging.  Symptoms consistent with vertigo and patient was started on meclizine and ondansetron to manage symptoms.  She was encouraged to follow-up with ENT should symptoms persist.  Discussed alarm symptoms that would warrant emergent evaluation to which patient expressed understanding.  Strict return precaution given to which patient expressed understanding.  Final Clinical Impressions(s) / UC Diagnoses    Final diagnoses:  Benign paroxysmal positional vertigo, unspecified laterality  Nausea     Discharge Instructions     Take meclizine 3 times a day to help with vertigo symptoms.  Use Zofran under your tongue for nausea.  Make sure you drink plenty of fluid and rest.  Please have someone help you ambulate to prevent a fall.  If your symptoms are not improving please go to the ENT as we discussed.  If anything worsens including vomiting, weakness, headache, fever you need to go to the emergency room as we discussed.    ED Prescriptions    Medication Sig Dispense Auth. Provider   ondansetron (ZOFRAN ODT) 4 MG disintegrating tablet Take 1 tablet (4 mg total) by mouth every 8 (eight) hours as needed for nausea or vomiting. 20 tablet Gavan Nordby K, PA-C   meclizine (ANTIVERT) 25 MG tablet Take 1 tablet (25 mg total) by mouth 3 (three) times daily as needed for dizziness. 30 tablet Terry Abila, Derry Skill, PA-C     PDMP not reviewed this encounter.   Terrilee Croak, PA-C 01/11/21 1508

## 2021-01-14 ENCOUNTER — Ambulatory Visit (HOSPITAL_COMMUNITY): Payer: Medicaid Other | Admitting: Physical Therapy

## 2021-01-14 ENCOUNTER — Encounter (HOSPITAL_COMMUNITY): Payer: Self-pay | Admitting: Physical Therapy

## 2021-01-14 ENCOUNTER — Other Ambulatory Visit: Payer: Self-pay

## 2021-01-14 ENCOUNTER — Telehealth: Payer: Self-pay

## 2021-01-14 DIAGNOSIS — R262 Difficulty in walking, not elsewhere classified: Secondary | ICD-10-CM

## 2021-01-14 DIAGNOSIS — M6281 Muscle weakness (generalized): Secondary | ICD-10-CM

## 2021-01-14 DIAGNOSIS — R2689 Other abnormalities of gait and mobility: Secondary | ICD-10-CM

## 2021-01-14 DIAGNOSIS — M5440 Lumbago with sciatica, unspecified side: Secondary | ICD-10-CM | POA: Diagnosis not present

## 2021-01-14 MED ORDER — FLUCONAZOLE 150 MG PO TABS: 150.0000 mg | ORAL_TABLET | ORAL | 1 refills | Status: DC

## 2021-01-14 NOTE — Telephone Encounter (Signed)
Sent in treatment for yeast infection per protocol as directed by Dr. Rip Harbour. Called pt to inform her that her urine cx was negative and she had a yeast infection and a prescription was sent to her pharmacy. Verified pharmacy and instructed pt on how to take medication. Pt questioned what to do the next time she was prescribed antibiotics. She was instructed to inform the provider that she gets yeast infections so she can be prescribed a prophylactic. Pt confirmed understanding.

## 2021-01-14 NOTE — Patient Instructions (Signed)
Access Code: [email or web address] URL: [email or web address] Date: 01/14/2021 Prepared by: Mitzi Hansen Jeter Tomey  Exercises Prone Hip Extension - 1 x daily - 7 x weekly - 2 sets - 10 reps

## 2021-01-14 NOTE — Telephone Encounter (Signed)
-----   Message from Chancy Milroy, MD sent at 01/11/2021  3:20 PM EDT ----- Please let pt know that her UC was negative. Tx her yeast as per protocol.  Thanks Legrand Como

## 2021-01-14 NOTE — Addendum Note (Signed)
Addended by: Dorita Sciara, Wing Gfeller A on: 01/14/2021 09:22 AM   Modules accepted: Orders

## 2021-01-14 NOTE — Therapy (Signed)
[place], [place], [address] Phone: [phone]   Fax:  [phone]  Physical Therapy Treatment  Patient Details  Name: Kaylee Miller MRN: [id number] Date of Birth: 08/30/1990 Referring Provider (PT): Ashok Pall, MD   Encounter Date: 01/14/2021   PT End of Session - 01/14/21 1449    Visit Number 6    Number of Visits 12    Date for PT Re-Evaluation 02/05/21    Authorization Type  Medicaid [employer]    Authorization Time Period 5/4-6/14    Authorization - Visit Number 6    Authorization - Number of Visits 12    Progress Note Due on Visit 10    PT Start Time 1449    PT Stop Time 1529    PT Time Calculation (min) 40 min    Activity Tolerance Patient tolerated treatment well    Behavior During Therapy [facility] for tasks assessed/performed           Past Medical History:  Diagnosis Date  . Asthma    Phreesia 07/02/2020  . Contraceptive management 02/08/2015  . Headache(784.0)    migraines  . History of bronchitis   . HSV-2 (herpes simplex virus 2) infection   . Pregnant   . S/P C-section 07/28/2013  . Sciatic nerve pain   . Seasonal allergies     Past Surgical History:  Procedure Laterality Date  . adenoids    . CESAREAN SECTION  2007  . CESAREAN SECTION N/A 07/28/2013   Procedure: CESAREAN SECTION;  Surgeon: Jonnie Kind, MD;  Location: [facility];  Service: Obstetrics;  Laterality: N/A;  . tubes in ears      There were no vitals filed for this visit.   Subjective Assessment - 01/14/21 1450    Subjective Patient states the side plank hurt her legs. Patient states back has been easing up when she is at work. She has been working on her home exercises. She had to go to ED for dizziness on friday but has not had any more dizziness since then.    Diagnostic tests MRI: L5-S1 with a large right lateral recess/foraminal disc extrusion heading in the caudal  direction which contacts and impinges the  descending right S1 nerve root as well as contact the exiting right L5 nerve root. There is severe right and moderate left neural foraminal narrowing as well mild central canal stenosis    Currently in Pain? No/denies    Pain Onset More than a month ago                             [facility] PT Treatment/Exercise - 01/14/21 0001      Lumbar Exercises: Standing   Other Standing Lumbar Exercises box lifts 1x 10 with 10lbs in box, 2x 10 18# in box, cueing for mechanics      Lumbar Exercises: Sidelying   Other Sidelying Lumbar Exercises modified side plank 3 x 5-10" hold bilateral      Lumbar Exercises: Prone   Straight Leg Raise 10 reps    Straight Leg Raises Limitations bilateral    Other Prone Lumbar Exercises prone press up 2x 10                  PT Education - 01/14/21 1450    Education Details HEP, exercise mechanics, lifting mechanics, TRA activation    Person(s) Educated Patient    Methods Explanation;Demonstration;Handout  Comprehension Verbalized understanding;Returned demonstration            PT Short Term Goals - 12/31/20 1532      PT SHORT TERM GOAL #1   Title Patient will report at least 25% improvement in symptoms for improved quality of life.    Time 3    Period Weeks    Status On-going    Target Date 01/15/21      PT SHORT TERM GOAL #2   Title Patient will be independent with HEP in order to improve functional outcomes.    Time 3    Period Weeks    Status On-going    Target Date 01/15/21      PT SHORT TERM GOAL #3   Title Patient will report pain not exceeding 3/10 with functional lift in order to improve activity tolerance    Baseline 7/10 pain    Time 3    Period Weeks    Status On-going    Target Date 01/15/21             PT Long Term Goals - 12/31/20 1533      PT LONG TERM GOAL #1   Title Patient will report at least 50% improvement in overall symptoms and function to demonstrate overall improved functional  ability    Time 6    Period Weeks    Status On-going      PT LONG TERM GOAL #2   Title Patient will demo proper lifting form over 5 observations and with pain not exceeding 1/10 in order to improve activity tolerance    Baseline poor lifting form with 7/10 pain    Time 6    Period Weeks    Status On-going      PT LONG TERM GOAL #3   Title Patient will demo core strength of 4/5 in order to improve trunk stability and reduce risk for re-injury    Baseline DNT due to pain/guarding    Time 6    Period Weeks    Status On-going      PT LONG TERM GOAL #4   Title Patient will demo and report independence in self-management techniuqes to reduce peripheralization of pain in BLE    Baseline pain extends bilateral buttocks and posterior thigh above the knee    Time 6    Period Weeks    Status On-going                 Plan - 01/14/21 1449    Clinical Impression Statement Discussed patient's recent dizziness symptoms and low back symptoms. Began session with press up and educated patient on trialing when back is bothering her after work. Reviewed plank exercise and gave patient tactile and verbal cueing for mechanics. Patient educated on lifting mechanics and she performs with good mechanics following cueing and demonstration.  She fatigues quickly with prone hip extension secondary to impaired glute strength and endurance. Patient will continue to benefit from skilled physical therapy in order to reduce impairment and improve function.    Personal Factors and Comorbidities Time since onset of injury/illness/exacerbation;Profession    Examination-Activity Limitations Bend;Caring for Others;Carry;Lift;Stand;Squat;Sit;Locomotion Level    Examination-Participation Restrictions Cleaning;Community Activity;Yard Work;Occupation    Stability/Clinical Decision Making Stable/Uncomplicated    Rehab Potential Good    PT Frequency 2x / week    PT Duration 6 weeks    PT Treatment/Interventions  ADLs/Self Care Home Management;Aquatic Therapy;Cryotherapy;Electrical Stimulation;DME Instruction;Ultrasound;Traction;Moist Heat;Gait training;Stair training;Functional mobility training;Therapeutic activities;Therapeutic exercise;Balance training;Patient/family education;Neuromuscular re-education;Manual  techniques;Passive range of motion;Taping;Energy conservation;Dry needling;Spinal Manipulations;Joint Manipulations    PT Next Visit Plan Continue with extension focus.  Progress core strength as tolerated and f/u with lifting mechanics instruction next session.    PT Home Exercise Plan prone, prone on elbows, standing extensions, lifting mechanics, ab set, side plank, prone press up 5/23 prone hip extension, lifting mechanics    Consulted and Agree with Plan of Care Patient           Patient will benefit from skilled therapeutic intervention in order to improve the following deficits and impairments:  Decreased activity tolerance,Decreased mobility,Decreased endurance,Decreased range of motion,Decreased strength,Difficulty walking,Increased muscle spasms,Improper body mechanics,Postural dysfunction,Pain  Visit Diagnosis: Difficulty in walking, not elsewhere classified  Other abnormalities of gait and mobility  Muscle weakness (generalized)  Bilateral low back pain with sciatica, sciatica laterality unspecified, unspecified chronicity     Problem List Patient Active Problem List   Diagnosis Date Noted  . Vaginal irritation 01/09/2021  . Urinary frequency 01/09/2021  . Spinal stenosis of lumbar region with neurogenic claudication 12/19/2020  . Disc displacement, lumbar 11/12/2020  . Essential (primary) hypertension 11/12/2020  . ASCUS of cervix with negative high risk HPV 09/21/2020  . Encounter for gynecological examination with Papanicolaou smear of cervix 09/18/2020  . Sciatica 08/30/2020  . Mild asthma without complication 76/81/1572  . Tobacco abuse 07/03/2020  . Migraines  01/05/2013    3:32 PM, 01/14/21 Mearl Latin PT, DPT Physical Therapist at [facility], [place], [address] Phone: [phone]   Fax:  [phone]  Name: Kaylee Miller MRN: [id number] Date of Birth: 1990-08-30

## 2021-01-15 DIAGNOSIS — M5126 Other intervertebral disc displacement, lumbar region: Secondary | ICD-10-CM | POA: Diagnosis not present

## 2021-01-16 ENCOUNTER — Encounter: Payer: Self-pay | Admitting: Emergency Medicine

## 2021-01-16 ENCOUNTER — Ambulatory Visit
Admission: EM | Admit: 2021-01-16 | Discharge: 2021-01-16 | Disposition: A | Discharge status: Home / Self Care | Payer: Medicaid Other | Attending: Family Medicine | Admitting: Family Medicine

## 2021-01-16 ENCOUNTER — Ambulatory Visit (HOSPITAL_COMMUNITY): Payer: Medicaid Other

## 2021-01-16 ENCOUNTER — Other Ambulatory Visit: Payer: Self-pay

## 2021-01-16 DIAGNOSIS — R21 Rash and other nonspecific skin eruption: Secondary | ICD-10-CM

## 2021-01-16 DIAGNOSIS — M79604 Pain in right leg: Secondary | ICD-10-CM | POA: Diagnosis not present

## 2021-01-16 DIAGNOSIS — M79605 Pain in left leg: Secondary | ICD-10-CM

## 2021-01-16 MED ORDER — IBUPROFEN 800 MG PO TABS: 800.0000 mg | ORAL_TABLET | Freq: Three times a day (TID) | ORAL | 0 refills | Status: DC | PRN

## 2021-01-16 MED ORDER — KETOROLAC TROMETHAMINE 30 MG/ML IJ SOLN
30.0000 mg | Freq: Once | INTRAMUSCULAR | Status: AC
  Administered 2021-01-16: 30 mg via INTRAMUSCULAR

## 2021-01-16 NOTE — ED Triage Notes (Signed)
Red spots on legs that hurt.  States thighs feel tight.  Symptoms started on Monday

## 2021-01-16 NOTE — Discharge Instructions (Addendum)
I have sent in ibuprofen for you to take one tablet every 8 hours as needed for pain and inflammation.  You have received an injection of toradol for pain today  Follow up with this office or with primary care if symptoms are persisting.  Follow up in the ER for high fever, trouble swallowing, trouble breathing, other concerning symptoms.

## 2021-01-16 NOTE — ED Provider Notes (Signed)
[facility]    CSN: [id number] Arrival date & time: 01/16/21  1531      History   Chief Complaint No chief complaint on file.   HPI Kaylee Miller is a 30 y.o. female.   Reports red rash to bilateral lower extremities that is painful.  States that her thighs also feel tight after doing physical therapy earlier this week.  States that symptoms began about 3 days ago.  Has not attempted OTC treatment.  States she was unsure about what to do.  Denies previous symptoms.  Denies headache, cough, abdominal pain, nausea, vomiting, diarrhea, fever, other symptoms.  ROS per HPI  The history is provided by the patient.    Past Medical History:  Diagnosis Date  . Asthma    Phreesia 07/02/2020  . Contraceptive management 02/08/2015  . Headache(784.0)    migraines  . History of bronchitis   . HSV-2 (herpes simplex virus 2) infection   . Pregnant   . S/P C-section 07/28/2013  . Sciatic nerve pain   . Seasonal allergies     Patient Active Problem List   Diagnosis Date Noted  . Vaginal irritation 01/09/2021  . Urinary frequency 01/09/2021  . Spinal stenosis of lumbar region with neurogenic claudication 12/19/2020  . Disc displacement, lumbar 11/12/2020  . Essential (primary) hypertension 11/12/2020  . ASCUS of cervix with negative high risk HPV 09/21/2020  . Encounter for gynecological examination with Papanicolaou smear of cervix 09/18/2020  . Sciatica 08/30/2020  . Mild asthma without complication 16/38/4665  . Tobacco abuse 07/03/2020  . Migraines 01/05/2013    Past Surgical History:  Procedure Laterality Date  . adenoids    . CESAREAN SECTION  2007  . CESAREAN SECTION N/A 07/28/2013   Procedure: CESAREAN SECTION;  Surgeon: Jonnie Kind, MD;  Location: [facility];  Service: Obstetrics;  Laterality: N/A;  . tubes in ears      OB History    Gravida  2   Para  2   Term  2   Preterm      AB      Living  2     SAB      IAB      Ectopic       Multiple      Live Births  2            Home Medications    Prior to Admission medications   Medication Sig Start Date End Date Taking? Authorizing Provider  ibuprofen (ADVIL) 800 MG tablet Take 1 tablet (800 mg total) by mouth every 8 (eight) hours as needed for moderate pain. 01/16/21  Yes Faustino Congress, NP  acetaminophen (TYLENOL) 500 MG tablet Take 1,500 mg every 6 (six) hours as needed by mouth for moderate pain or headache.    [provider]  albuterol (VENTOLIN HFA) 108 (90 Base) MCG/ACT inhaler Inhale 2 puffs into the lungs every 6 (six) hours as needed for wheezing or shortness of breath. 06/10/20   Rolland Porter, MD  Aspirin-Acetaminophen-Caffeine (EXCEDRIN MIGRAINE PO) Take by mouth.    [provider]  chlorhexidine (PERIDEX) 0.12 % solution Use as directed 15 mLs in the mouth or throat 2 (two) times daily. 10/06/20   Avegno, Darrelyn Hillock, FNP  fluconazole (DIFLUCAN) 150 MG tablet Take 1 tablet (150 mg total) by mouth every 3 (three) days. 01/14/21   Chancy Milroy, MD  hydrocortisone (ANUSOL-HC) 2.5 % rectal cream Place 1 application rectally 2 (two) times daily.  07/11/20   Lindell Spar, MD  meclizine (ANTIVERT) 25 MG tablet Take 1 tablet (25 mg total) by mouth 3 (three) times daily as needed for dizziness. 01/11/21   Raspet, Derry Skill, PA-C  medroxyPROGESTERone Acetate 150 MG/ML SUSY INJECT 1 ML INTO THE MUSCLE EVERY 3 MONTHS 09/17/20   Derrek Monaco A, NP  naproxen (NAPROSYN) 500 MG tablet Take 1 tablet (500 mg total) by mouth 2 (two) times daily. 10/06/20   Avegno, Darrelyn Hillock, FNP  ondansetron (ZOFRAN ODT) 4 MG disintegrating tablet Take 1 tablet (4 mg total) by mouth every 8 (eight) hours as needed for nausea or vomiting. 01/11/21   Raspet, Junie Panning K, PA-C  tiZANidine (ZANAFLEX) 4 MG tablet Take 1 tablet (4 mg total) by mouth every 6 (six) hours as needed for muscle spasms. 11/06/20   Sanjuana Kava, MD  Vitamin D, Ergocalciferol, (DRISDOL) 1.25 MG (50000  UNIT) CAPS capsule Take 1 capsule (50,000 Units total) by mouth every 7 (seven) days. 12/19/20   Lindell Spar, MD    Family History Family History  Problem Relation Age of Onset  . Cancer Mother   . Breast cancer Mother   . Hypertension Father   . Stroke Father   . CAD Maternal Grandfather   . CAD Paternal Grandmother   . CAD Paternal Grandfather   . Diabetes Other        great-great grandmother  . CAD Other     Social History Social History   Tobacco Use  . Smoking status: Current Some Day Smoker    Packs/day: 1.00    Years: 9.00    Pack years: 9.00    Types: Cigarettes  . Smokeless tobacco: Never Used  Vaping Use  . Vaping Use: Never used  Substance Use Topics  . Alcohol use: Yes    Alcohol/week: 0.0 standard drinks  . Drug use: Never     Allergies   Latex   Review of Systems Review of Systems   Physical Exam Triage Vital Signs ED Triage Vitals  Enc Vitals Group     BP 01/16/21 1538 113/69     Pulse Rate 01/16/21 1538 95     Resp 01/16/21 1538 17     Temp 01/16/21 1538 99.4 F (37.4 C)     Temp Source 01/16/21 1538 Oral     SpO2 01/16/21 1538 97 %     Weight --      Height --      Head Circumference --      Peak Flow --      Pain Score 01/16/21 1542 10     Pain Loc --      Pain Edu? --      Excl. in [place]? --    No data found.  Updated Vital Signs BP 113/69   Pulse 95   Temp 99.4 F (37.4 C) (Oral)   Resp 17   LMP  (LMP Unknown)   SpO2 97%   Visual Acuity Right Eye Distance:   Left Eye Distance:   Bilateral Distance:    Right Eye Near:   Left Eye Near:    Bilateral Near:     Physical Exam Vitals and nursing note reviewed.  Constitutional:      General: She is not in acute distress.    Appearance: Normal appearance. She is well-developed and normal weight. She is not ill-appearing.  HENT:     Head: Normocephalic and atraumatic.     Nose: Nose normal.  Mouth/Throat:     Mouth: Mucous membranes are moist.     Pharynx:  Oropharynx is clear.  Eyes:     Extraocular Movements: Extraocular movements intact.     Conjunctiva/sclera: Conjunctivae normal.     Pupils: Pupils are equal, round, and reactive to light.  Cardiovascular:     Rate and Rhythm: Normal rate and regular rhythm.     Heart sounds: No murmur heard.   Pulmonary:     Effort: Pulmonary effort is normal.  Musculoskeletal:        General: Normal range of motion.     Cervical back: Normal range of motion and neck supple.  Skin:    General: Skin is warm and dry.     Capillary Refill: Capillary refill takes less than 2 seconds.     Findings: Rash present.     Comments: Lacelike dark red/purplish rash to bilateral lower extremities, appears to be a vascular rash.  Tender to touch, no swelling noted, no bruising, no open skin, no draining noted.  Neurological:     General: No focal deficit present.     Mental Status: She is alert and oriented to person, place, and time.  Psychiatric:        Mood and Affect: Mood normal.        Behavior: Behavior normal.        Thought Content: Thought content normal.      UC Treatments / Results  Labs (all labs ordered are listed, but only abnormal results are displayed) Labs Reviewed - No data to display  EKG   Radiology No results found.  Procedures Procedures (including critical care time)  Medications Ordered in UC Medications  ketorolac (TORADOL) 30 MG/ML injection 30 mg (30 mg Intramuscular Given 01/16/21 1618)    Initial Impression / Assessment and Plan / UC Course  I have reviewed the triage vital signs and the nursing notes.  Pertinent labs & imaging results that were available during my care of the patient were reviewed by me and considered in my medical decision making (see chart for details).    Bilateral leg pain Rash  Ibuprofen prescribed 800 mg every 6 hours as needed for pain Toradol 30 mg given IM in office today for pain May use heat or ice to the area as needed for  comfort Follow up with this office or with primary care if symptoms are persisting.  Follow up in the ER for high fever, trouble swallowing, trouble breathing, other concerning symptoms.   Final Clinical Impressions(s) / UC Diagnoses   Final diagnoses:  Bilateral leg pain  Rash and nonspecific skin eruption     Discharge Instructions     I have sent in ibuprofen for you to take one tablet every 8 hours as needed for pain and inflammation.  You have received an injection of toradol for pain today  Follow up with this office or with primary care if symptoms are persisting.  Follow up in the ER for high fever, trouble swallowing, trouble breathing, other concerning symptoms.     ED Prescriptions    Medication Sig Dispense Auth. Provider   ibuprofen (ADVIL) 800 MG tablet Take 1 tablet (800 mg total) by mouth every 8 (eight) hours as needed for moderate pain. 21 tablet Faustino Congress, NP     PDMP not reviewed this encounter.   Faustino Congress, NP 01/16/21 1935

## 2021-01-22 ENCOUNTER — Other Ambulatory Visit: Payer: Self-pay

## 2021-01-22 ENCOUNTER — Ambulatory Visit (HOSPITAL_COMMUNITY): Payer: Medicaid Other | Admitting: Physical Therapy

## 2021-01-22 DIAGNOSIS — M5431 Sciatica, right side: Secondary | ICD-10-CM

## 2021-01-22 DIAGNOSIS — M5440 Lumbago with sciatica, unspecified side: Secondary | ICD-10-CM | POA: Diagnosis not present

## 2021-01-22 DIAGNOSIS — R2689 Other abnormalities of gait and mobility: Secondary | ICD-10-CM

## 2021-01-22 DIAGNOSIS — M6281 Muscle weakness (generalized): Secondary | ICD-10-CM

## 2021-01-22 DIAGNOSIS — R262 Difficulty in walking, not elsewhere classified: Secondary | ICD-10-CM

## 2021-01-22 NOTE — Therapy (Signed)
[facility] [place], [place], [address] Phone: [phone]   Fax:  [phone]  Physical Therapy Treatment  Patient Details  Name: Kaylee Miller MRN: [id number] Date of Birth: [birth year] Referring Provider (PT): Ashok Pall, MD   Encounter Date: 01/22/2021   PT End of Session - 01/22/21 1157    Visit Number 7    Number of Visits 12    Date for PT Re-Evaluation 02/05/21    Authorization Type [place] Medicaid [employer]    Authorization Time Period 5/4-6/14    Authorization - Visit Number 7    Authorization - Number of Visits 12    Progress Note Due on Visit 10    PT Start Time 1132    PT Stop Time 1210    PT Time Calculation (min) 38 min    Activity Tolerance Patient tolerated treatment well    Behavior During Therapy [facility] for tasks assessed/performed           Past Medical History:  Diagnosis Date  . Asthma    Phreesia 07/02/2020  . Contraceptive management 02/08/2015  . Headache(784.0)    migraines  . History of bronchitis   . HSV-2 (herpes simplex virus 2) infection   . Pregnant   . S/P C-section 07/28/2013  . Sciatic nerve pain   . Seasonal allergies     Past Surgical History:  Procedure Laterality Date  . adenoids    . CESAREAN SECTION  2007  . CESAREAN SECTION N/A 07/28/2013   Procedure: CESAREAN SECTION;  Surgeon: Jonnie Kind, MD;  Location: [facility];  Service: Obstetrics;  Laterality: N/A;  . tubes in ears      There were no vitals filed for this visit.   Subjective Assessment - 01/22/21 1127    Subjective PT states that her back pain is ok but her pain in her legs has been terrible B all the way down to her calf. She went to the urgent care and they said something about her veins but she did not understand    Diagnostic tests MRI: L5-S1 with a large right lateral recess/foraminal disc extrusion heading in the caudal  direction which contacts and impinges the descending right S1 nerve root as  well as contact the exiting right L5 nerve root. There is severe right and moderate left neural foraminal narrowing as well mild central canal stenosis    Currently in Pain? Yes    Pain Score 6     Pain Location Leg    Pain Orientation Left;Right    Pain Descriptors / Indicators Dull    Pain Type Acute pain    Pain Onset More than a month ago    Pain Frequency Constant    Aggravating Factors  bending and stooping    Pain Relieving Factors rest                  [facility] Adult PT Treatment/Exercise - 01/22/21 0001      Exercises   Exercises Lumbar      Lumbar Exercises: Stretches   Active Hamstring Stretch Left;Right;3 reps;30 seconds    Single Knee to Chest Stretch Left;Right;3 reps;30 seconds    Prone on Elbows Stretch Limitations 3 minutes    Press Ups 10 seconds    Piriformis Stretch Left;Right;1 rep;60 seconds    Piriformis Stretch Limitations all 4's      Lumbar Exercises: Seated   Sit to Stand 5 reps      Lumbar  Exercises: Supine   Bridge with March 5 reps      Lumbar Exercises: Prone   Opposite Arm/Leg Raise Left arm/Right leg;Right arm/Left leg;10 reps      Lumbar Exercises: Quadruped   Single Arm Raise 10 reps    Straight Leg Raise 10 reps          sitting ab set x 10           PT Short Term Goals - 12/31/20 1532      PT SHORT TERM GOAL #1   Title Patient will report at least 25% improvement in symptoms for improved quality of life.    Time 3    Period Weeks    Status On-going    Target Date 01/15/21      PT SHORT TERM GOAL #2   Title Patient will be independent with HEP in order to improve functional outcomes.    Time 3    Period Weeks    Status On-going    Target Date 01/15/21      PT SHORT TERM GOAL #3   Title Patient will report pain not exceeding 3/10 with functional lift in order to improve activity tolerance    Baseline 7/10 pain    Time 3    Period Weeks    Status On-going    Target Date 01/15/21             PT Long  Term Goals - 12/31/20 1533      PT LONG TERM GOAL #1   Title Patient will report at least 50% improvement in overall symptoms and function to demonstrate overall improved functional ability    Time 6    Period Weeks    Status On-going      PT LONG TERM GOAL #2   Title Patient will demo proper lifting form over 5 observations and with pain not exceeding 1/10 in order to improve activity tolerance    Baseline poor lifting form with 7/10 pain    Time 6    Period Weeks    Status On-going      PT LONG TERM GOAL #3   Title Patient will demo core strength of 4/5 in order to improve trunk stability and reduce risk for re-injury    Baseline DNT due to pain/guarding    Time 6    Period Weeks    Status On-going      PT LONG TERM GOAL #4   Title Patient will demo and report independence in self-management techniuqes to reduce peripheralization of pain in BLE    Baseline pain extends bilateral buttocks and posterior thigh above the knee    Time 6    Period Weeks    Status On-going                 Plan - 01/22/21 1157    Clinical Impression Statement Pt has no c/0 dizziness this session.  Session concentrated on stretches and stabilization exercises.  Advanced PT HEP    Personal Factors and Comorbidities Time since onset of injury/illness/exacerbation;Profession    Examination-Activity Limitations Bend;Caring for Others;Carry;Lift;Stand;Squat;Sit;Locomotion Level    Examination-Participation Restrictions Cleaning;Community Activity;Yard Work;Occupation    Stability/Clinical Decision Making Stable/Uncomplicated    Rehab Potential Good    PT Frequency 2x / week    PT Duration 6 weeks    PT Treatment/Interventions ADLs/Self Care Home Management;Aquatic Therapy;Cryotherapy;Electrical Stimulation;DME Instruction;Ultrasound;Traction;Moist Heat;Gait training;Stair training;Functional mobility training;Therapeutic activities;Therapeutic exercise;Balance training;Patient/family  education;Neuromuscular re-education;Manual techniques;Passive range of motion;Taping;Energy conservation;Dry needling;Spinal  Manipulations;Joint Manipulations    PT Next Visit Plan Continue with extension focus.  Progress core strength as tolerated and f/u with lifting mechanics instruction next session.    PT Home Exercise Plan prone, prone on elbows, standing extensions, lifting mechanics, ab set, side plank, prone press up 5/23 prone hip extension, lifting mechanics    Consulted and Agree with Plan of Care Patient           Patient will benefit from skilled therapeutic intervention in order to improve the following deficits and impairments:  Decreased activity tolerance,Decreased mobility,Decreased endurance,Decreased range of motion,Decreased strength,Difficulty walking,Increased muscle spasms,Improper body mechanics,Postural dysfunction,Pain  Visit Diagnosis: No diagnosis found.     Problem List Patient Active Problem List   Diagnosis Date Noted  . Vaginal irritation 01/09/2021  . Urinary frequency 01/09/2021  . Spinal stenosis of lumbar region with neurogenic claudication 12/19/2020  . Disc displacement, lumbar 11/12/2020  . Essential (primary) hypertension 11/12/2020  . ASCUS of cervix with negative high risk HPV 09/21/2020  . Encounter for gynecological examination with Papanicolaou smear of cervix 09/18/2020  . Sciatica 08/30/2020  . Mild asthma without complication 20/91/9802  . Tobacco abuse 07/03/2020  . Migraines 01/05/2013   Rayetta Humphrey, PT CLT [phone] 01/22/2021, 12:08 PM  [facility] [address] [place], [place], [address] Phone: [phone]   Fax:  [phone]  Name: Kaylee Miller MRN: [id number] Date of Birth: Feb 19, 1991

## 2021-01-23 ENCOUNTER — Encounter: Payer: Self-pay | Admitting: Internal Medicine

## 2021-01-23 ENCOUNTER — Ambulatory Visit: Payer: Medicaid Other | Admitting: Internal Medicine

## 2021-01-23 VITALS — BP 111/68 | HR 90 | Temp 97.5°F | Ht 67.0 in | Wt 189.0 lb

## 2021-01-23 DIAGNOSIS — R42 Dizziness and giddiness: Secondary | ICD-10-CM

## 2021-01-23 NOTE — Patient Instructions (Addendum)
Please perform simple exercises for vertigo.  You can use following video for reference: Youtube video: Inner Ear Balance Home Exercises to Treat Dizziness (Vestibular Home Exercises)  Okay to take Meclizine as needed for dizziness.  You are being referred to ENT specialist. Benign Positional Vertigo Vertigo is the feeling that you or your surroundings are moving when they are not. Benign positional vertigo is the most common form of vertigo. This is usually a harmless condition (benign). This condition is positional. This means that symptoms are triggered by certain movements and positions. This condition can be dangerous if it occurs while you are doing something that could cause harm to you or others. This includes activities such as driving or operating machinery. What are the causes? The inner ear has fluid-filled canals that help your brain sense movement and balance. When the fluid moves, the brain receives messages about your body's position. With benign positional vertigo, crystals in the inner ear break free and disturb the inner ear area. This causes your brain to receive confusing messages about your body's position. What increases the risk? You are more likely to develop this condition if:  You are a woman.  You are 32 years of age or older.  You have recently had a head injury.  You have an inner ear disease. What are the signs or symptoms? Symptoms of this condition usually happen when you move your head or your eyes in different directions. Symptoms may start suddenly, and usually last for less than a minute. They include:  Loss of balance and falling.  Feeling like you are spinning or moving.  Feeling like your surroundings are spinning or moving.  Nausea and vomiting.  Blurred vision.  Dizziness.  Involuntary eye movement (nystagmus). Symptoms can be mild and cause only minor problems, or they can be severe and interfere with daily life. Episodes of benign  positional vertigo may return (recur) over time. Symptoms may improve over time. How is this diagnosed? This condition may be diagnosed based on:  Your medical history.  Physical exam of the head, neck, and ears.  Positional tests to check for or stimulate vertigo. You may be asked to turn your head and change positions, such as going from sitting to lying down. A health care provider will watch for symptoms of vertigo. You may be referred to a health care provider who specializes in ear, nose, and throat problems (ENT, or otolaryngologist) or a provider who specializes in disorders of the nervous system (neurologist). How is this treated? This condition may be treated in a session in which your health care provider moves your head in specific positions to help the displaced crystals in your inner ear move. Treatment for this condition may take several sessions. Surgery may be needed in severe cases, but this is rare. In some cases, benign positional vertigo may resolve on its own in 2-4 weeks.   Follow these instructions at home: Safety  Move slowly. Avoid sudden body or head movements or certain positions, as told by your health care provider.  Avoid driving until your health care provider says it is safe for you to do so.  Avoid operating heavy machinery until your health care provider says it is safe for you to do so.  Avoid doing any tasks that would be dangerous to you or others if vertigo occurs.  If you have trouble walking or keeping your balance, try using a cane for stability. If you feel dizzy or unstable, sit down right away.  Return to your normal activities as told by your health care provider. Ask your health care provider what activities are safe for you. General instructions  Take over-the-counter and prescription medicines only as told by your health care provider.  Drink enough fluid to keep your urine pale yellow.  Keep all follow-up visits as told by your health  care provider. This is important. Contact a health care provider if:  You have a fever.  Your condition gets worse or you develop new symptoms.  Your family or friends notice any behavioral changes.  You have nausea or vomiting that gets worse.  You have numbness or a prickling and tingling sensation. Get help right away if you:  Have difficulty speaking or moving.  Are always dizzy.  Faint.  Develop severe headaches.  Have weakness in your legs or arms.  Have changes in your hearing or vision.  Develop a stiff neck.  Develop sensitivity to light. Summary  Vertigo is the feeling that you or your surroundings are moving when they are not. Benign positional vertigo is the most common form of vertigo.  This condition is caused by crystals in the inner ear that become displaced. This causes a disturbance in an area of the inner ear that helps your brain sense movement and balance.  Symptoms include loss of balance and falling, feeling that you or your surroundings are moving, nausea and vomiting, and blurred vision.  This condition can be diagnosed based on symptoms, a physical exam, and positional tests.  Follow safety instructions as told by your health care provider. You will also be told when to contact your health care provider in case of problems. This information is not intended to replace advice given to you by your health care provider. Make sure you discuss any questions you have with your health care provider. Document Revised: 07/05/2019 Document Reviewed: 01/20/2018 Elsevier Patient Education  2021 [employer].

## 2021-01-23 NOTE — Progress Notes (Signed)
Acute Office Visit  Subjective:    Patient ID: Kaylee Miller, female    DOB: 11/20/1990, 30 y.o.   MRN: [id number]  Chief Complaint  Patient presents with  . Dizziness    Ongoing x3 weeks, went to Urgent Care and Dx with Vertigo. Still feels the dizziness so wants ENT referral.     HPI Patient is in today for evaluation of dizziness for last 3 weeks. She noticed it first time when she was at work and had dizziness while turning head. She went to Urgent care and was prescribed Meclizine for dizziness. She also reports associated nausea. Her dizziness improves with Meclizine, but she would prefer to see ENT specialist as it is related to inner ear. Denies any recent URTI.  Past Medical History:  Diagnosis Date  . Asthma    Phreesia 07/02/2020  . Contraceptive management 02/08/2015  . Headache(784.0)    migraines  . History of bronchitis   . HSV-2 (herpes simplex virus 2) infection   . Pregnant   . S/P C-section 07/28/2013  . Sciatic nerve pain   . Seasonal allergies     Past Surgical History:  Procedure Laterality Date  . adenoids    . CESAREAN SECTION  2007  . CESAREAN SECTION N/A 07/28/2013   Procedure: CESAREAN SECTION;  Surgeon: Jonnie Kind, MD;  Location: [facility];  Service: Obstetrics;  Laterality: N/A;  . tubes in ears      Family History  Problem Relation Age of Onset  . Cancer Mother   . Breast cancer Mother   . Hypertension Father   . Stroke Father   . CAD Maternal Grandfather   . CAD Paternal Grandmother   . CAD Paternal Grandfather   . Diabetes Other        great-great grandmother  . CAD Other     Social History   Socioeconomic History  . Marital status: Single    Spouse name: Not on file  . Number of children: Not on file  . Years of education: Not on file  . Highest education level: Not on file  Occupational History  . Not on file  Tobacco Use  . Smoking status: Current Some Day Smoker    Packs/day: 1.00    Years: 9.00    Pack years:  9.00    Types: Cigarettes  . Smokeless tobacco: Never Used  Vaping Use  . Vaping Use: Never used  Substance and Sexual Activity  . Alcohol use: Yes    Alcohol/week: 0.0 standard drinks  . Drug use: Never  . Sexual activity: Not Currently    Birth control/protection: Injection  Other Topics Concern  . Not on file  Social History Narrative  . Not on file   Social Determinants of Health   Financial Resource Strain: Medium Risk  . Difficulty of Paying Living Expenses: Somewhat hard  Food Insecurity: Food Insecurity Present  . Worried About Charity fundraiser in the Last Year: Sometimes true  . Ran Out of Food in the Last Year: Never true  Transportation Needs: No Transportation Needs  . Lack of Transportation (Medical): No  . Lack of Transportation (Non-Medical): No  Physical Activity: Sufficiently Active  . Days of Exercise per Week: 5 days  . Minutes of Exercise per Session: 100 min  Stress: No Stress Concern Present  . Feeling of Stress : Only a little  Social Connections: Moderately Integrated  . Frequency of Communication with Friends and Family: More than three times  a week  . Frequency of Social Gatherings with Friends and Family: Twice a week  . Attends Religious Services: More than 4 times per year  . Active Member of Clubs or Organizations: Yes  . Attends Archivist Meetings: 1 to 4 times per year  . Marital Status: Never married  Intimate Partner Violence: Unknown  . Fear of Current or Ex-Partner: No  . Emotionally Abused: Patient refused  . Physically Abused: No  . Sexually Abused: No    Outpatient Medications Prior to Visit  Medication Sig Dispense Refill  . acetaminophen (TYLENOL) 500 MG tablet Take 1,500 mg every 6 (six) hours as needed by mouth for moderate pain or headache.    . albuterol (VENTOLIN HFA) 108 (90 Base) MCG/ACT inhaler Inhale 2 puffs into the lungs every 6 (six) hours as needed for wheezing or shortness of breath. 6.7 g 0  .  Aspirin-Acetaminophen-Caffeine (EXCEDRIN MIGRAINE PO) Take by mouth.    . chlorhexidine (PERIDEX) 0.12 % solution Use as directed 15 mLs in the mouth or throat 2 (two) times daily. 120 mL 0  . fluconazole (DIFLUCAN) 150 MG tablet Take 1 tablet (150 mg total) by mouth every 3 (three) days. 1 tablet 1  . hydrocortisone (ANUSOL-HC) 2.5 % rectal cream Place 1 application rectally 2 (two) times daily. 30 g 0  . ibuprofen (ADVIL) 800 MG tablet Take 1 tablet (800 mg total) by mouth every 8 (eight) hours as needed for moderate pain. 21 tablet 0  . meclizine (ANTIVERT) 25 MG tablet Take 1 tablet (25 mg total) by mouth 3 (three) times daily as needed for dizziness. 30 tablet 0  . medroxyPROGESTERone Acetate 150 MG/ML SUSY INJECT 1 ML INTO THE MUSCLE EVERY 3 MONTHS 1 mL 3  . naproxen (NAPROSYN) 500 MG tablet Take 1 tablet (500 mg total) by mouth 2 (two) times daily. 30 tablet 0  . ondansetron (ZOFRAN ODT) 4 MG disintegrating tablet Take 1 tablet (4 mg total) by mouth every 8 (eight) hours as needed for nausea or vomiting. 20 tablet 0  . tiZANidine (ZANAFLEX) 4 MG tablet Take 1 tablet (4 mg total) by mouth every 6 (six) hours as needed for muscle spasms. 30 tablet 0  . Vitamin D, Ergocalciferol, (DRISDOL) 1.25 MG (50000 UNIT) CAPS capsule Take 1 capsule (50,000 Units total) by mouth every 7 (seven) days. 5 capsule 5   No facility-administered medications prior to visit.    Allergies  Allergen Reactions  . Latex Swelling    Review of Systems  Constitutional: Negative for chills and fever.  HENT: Negative for congestion, sinus pressure, sinus pain and sore throat.   Eyes: Negative for pain and discharge.  Respiratory: Negative for cough and shortness of breath.   Cardiovascular: Negative for chest pain and palpitations.  Gastrointestinal: Negative for abdominal pain, constipation, diarrhea, nausea and vomiting.  Endocrine: Negative for polydipsia and polyuria.  Genitourinary: Negative for dysuria and  hematuria.  Musculoskeletal: Positive for back pain. Negative for neck pain and neck stiffness.  Skin: Negative for rash.  Neurological: Positive for dizziness and numbness (LE). Negative for speech difficulty, weakness and headaches.  Psychiatric/Behavioral: Negative for agitation and behavioral problems.       Objective:    Physical Exam Vitals reviewed.  Constitutional:      General: She is not in acute distress.    Appearance: She is not diaphoretic.  HENT:     Head: Normocephalic and atraumatic.     Ears:     Comments:  Excess ear wax, L > R    Nose: Nose normal. No congestion.     Mouth/Throat:     Mouth: Mucous membranes are moist.     Pharynx: No posterior oropharyngeal erythema.  Eyes:     General: No scleral icterus.    Extraocular Movements: Extraocular movements intact.  Cardiovascular:     Rate and Rhythm: Normal rate and regular rhythm.     Pulses: Normal pulses.     Heart sounds: Normal heart sounds. No murmur heard.   Pulmonary:     Breath sounds: Normal breath sounds. No wheezing or rales.  Musculoskeletal:     Cervical back: Neck supple. No tenderness.     Right lower leg: No edema.     Left lower leg: No edema.     Comments: ROM of the lumbar spine limited due to pain, no point tenderness in the lumbar area  Skin:    General: Skin is warm.     Findings: No rash.  Neurological:     General: No focal deficit present.     Mental Status: She is alert and oriented to person, place, and time.  Psychiatric:        Mood and Affect: Mood normal.        Behavior: Behavior normal.     BP 111/68 (BP Location: Right Arm, Patient Position: Sitting, Cuff Size: Large)   Pulse 90   Temp (!) 97.5 F (36.4 C) (Temporal)   Ht 5\' 7"  (1.702 m)   Wt 189 lb (85.7 kg)   LMP  (LMP Unknown)   SpO2 97%   BMI 29.60 kg/m  Wt Readings from Last 3 Encounters:  01/23/21 189 lb (85.7 kg)  01/09/21 189 lb 3.2 oz (85.8 kg)  12/19/20 189 lb 6.4 oz (85.9 kg)    Health  Maintenance Due  Topic Date Due  . Hepatitis C Screening  Never done    There are no preventive care reminders to display for this patient.   Lab Results  Component Value Date   TSH 1.300 07/03/2020   Lab Results  Component Value Date   WBC 6.6 07/03/2020   HGB 13.5 07/03/2020   HCT 40.9 07/03/2020   MCV 84 07/03/2020   PLT 222 07/03/2020   Lab Results  Component Value Date   NA 139 07/03/2020   K 4.2 07/03/2020   CO2 22 07/03/2020   GLUCOSE 84 07/03/2020   BUN 11 07/03/2020   CREATININE 0.75 07/03/2020   BILITOT 0.5 07/03/2020   ALKPHOS 57 07/03/2020   AST 13 07/03/2020   ALT 23 07/03/2020   PROT 6.6 07/03/2020   ALBUMIN 4.2 07/03/2020   CALCIUM 9.3 07/03/2020   ANIONGAP 7 01/09/2020   Lab Results  Component Value Date   CHOL 223 (H) 07/03/2020   Lab Results  Component Value Date   HDL 51 07/03/2020   Lab Results  Component Value Date   LDLCALC 158 (H) 07/03/2020   Lab Results  Component Value Date   TRIG 79 07/03/2020   Lab Results  Component Value Date   CHOLHDL 4.4 07/03/2020   No results found for: HGBA1C     Assessment & Plan:   Problem List Items Addressed This Visit   None   Visit Diagnoses    Vertigo    -  Primary Recent Urgent care visit for vertigo Meclizine PRN Vestibular exercises, material provided Referred to ENT for further evaluation   Relevant Orders   Ambulatory referral to ENT  No orders of the defined types were placed in this encounter.    Lindell Spar, MD

## 2021-01-24 ENCOUNTER — Ambulatory Visit (HOSPITAL_COMMUNITY): Payer: Medicaid Other | Attending: Neurosurgery | Admitting: Physical Therapy

## 2021-01-24 ENCOUNTER — Other Ambulatory Visit: Payer: Self-pay

## 2021-01-24 DIAGNOSIS — M5432 Sciatica, left side: Secondary | ICD-10-CM | POA: Diagnosis not present

## 2021-01-24 DIAGNOSIS — M6281 Muscle weakness (generalized): Secondary | ICD-10-CM | POA: Insufficient documentation

## 2021-01-24 DIAGNOSIS — R262 Difficulty in walking, not elsewhere classified: Secondary | ICD-10-CM | POA: Diagnosis not present

## 2021-01-24 DIAGNOSIS — M5440 Lumbago with sciatica, unspecified side: Secondary | ICD-10-CM | POA: Diagnosis not present

## 2021-01-24 DIAGNOSIS — M5431 Sciatica, right side: Secondary | ICD-10-CM | POA: Insufficient documentation

## 2021-01-24 DIAGNOSIS — R2689 Other abnormalities of gait and mobility: Secondary | ICD-10-CM

## 2021-01-24 NOTE — Therapy (Signed)
[facility] [place], [place], [address] Phone: [phone]   Fax:  [phone]  Physical Therapy Treatment  Patient Details  Name: Kaylee Miller MRN: [id number] Date of Birth: February 13, 1991 Referring Provider (PT): Ashok Pall, MD   Encounter Date: 01/24/2021   PT End of Session - 01/24/21 1736    Visit Number 8    Number of Visits 12    Date for PT Re-Evaluation 02/05/21    Authorization Type [place] [employer]    Authorization Time Period 5/4-6/14    Authorization - Visit Number 8    Authorization - Number of Visits 12    Progress Note Due on Visit 10    PT Start Time 6269    PT Stop Time 1700    PT Time Calculation (min) 45 min    Activity Tolerance Patient tolerated treatment well    Behavior During Therapy [facility] for tasks assessed/performed           Past Medical History:  Diagnosis Date  . Asthma    Phreesia 07/02/2020  . Contraceptive management 02/08/2015  . Headache(784.0)    migraines  . History of bronchitis   . HSV-2 (herpes simplex virus 2) infection   . Pregnant   . S/P C-section 07/28/2013  . Sciatic nerve pain   . Seasonal allergies     Past Surgical History:  Procedure Laterality Date  . adenoids    . CESAREAN SECTION  2007  . CESAREAN SECTION N/A 07/28/2013   Procedure: CESAREAN SECTION;  Surgeon: Jonnie Kind, MD;  Location: [facility];  Service: Obstetrics;  Laterality: N/A;  . tubes in ears      There were no vitals filed for this visit.   Subjective Assessment - 01/24/21 1622    Subjective pt states her Rt hip hurts and runs into her lateral thigh.  STates she just got off work.    Currently in Pain? Yes    Pain Score 7     Pain Location Hip    Pain Orientation Right    Pain Descriptors / Indicators Aching;Dull                             [facility] Adult PT Treatment/Exercise - 01/24/21 1730      Exercises   Exercises Lumbar      Lumbar Exercises:  Stretches   Active Hamstring Stretch Left;Right;3 reps;30 seconds    Single Knee to Chest Stretch Left;Right;3 reps;30 seconds    Prone on Elbows Stretch Limitations 3 minutes    Press Ups 10 seconds    Piriformis Stretch Left;Right;1 rep;60 seconds    Piriformis Stretch Limitations all 4's      Lumbar Exercises: Seated   Sit to Stand 5 reps      Lumbar Exercises: Supine   Bridge with March 5 reps      Lumbar Exercises: Prone   Opposite Arm/Leg Raise Left arm/Right leg;Right arm/Left leg;10 reps      Lumbar Exercises: Quadruped   Single Arm Raise 10 reps    Straight Leg Raise 10 reps                    PT Short Term Goals - 12/31/20 1532      PT SHORT TERM GOAL #1   Title Patient will report at least 25% improvement in symptoms for improved quality of life.    Time 3  Period Weeks    Status On-going    Target Date 01/15/21      PT SHORT TERM GOAL #2   Title Patient will be independent with HEP in order to improve functional outcomes.    Time 3    Period Weeks    Status On-going    Target Date 01/15/21      PT SHORT TERM GOAL #3   Title Patient will report pain not exceeding 3/10 with functional lift in order to improve activity tolerance    Baseline 7/10 pain    Time 3    Period Weeks    Status On-going    Target Date 01/15/21             PT Long Term Goals - 12/31/20 1533      PT LONG TERM GOAL #1   Title Patient will report at least 50% improvement in overall symptoms and function to demonstrate overall improved functional ability    Time 6    Period Weeks    Status On-going      PT LONG TERM GOAL #2   Title Patient will demo proper lifting form over 5 observations and with pain not exceeding 1/10 in order to improve activity tolerance    Baseline poor lifting form with 7/10 pain    Time 6    Period Weeks    Status On-going      PT LONG TERM GOAL #3   Title Patient will demo core strength of 4/5 in order to improve trunk stability and  reduce risk for re-injury    Baseline DNT due to pain/guarding    Time 6    Period Weeks    Status On-going      PT LONG TERM GOAL #4   Title Patient will demo and report independence in self-management techniuqes to reduce peripheralization of pain in BLE    Baseline pain extends bilateral buttocks and posterior thigh above the knee    Time 6    Period Weeks    Status On-going                 Plan - 01/24/21 1734    Clinical Impression Statement pt with Rt lateral LE pain today but no LBP.  Continued to work on core stab and LE stretches to improve symptoms.  Pt requires verbal and tactile cues for form with most exercises, however able to complete all without complaints of pain or issues.  Added quadruped opposite UE/LE  with cues for form.  Pt requires assistance to keep count of reps as well as tends to do too many.    Personal Factors and Comorbidities Time since onset of injury/illness/exacerbation;Profession    Examination-Activity Limitations Bend;Caring for Others;Carry;Lift;Stand;Squat;Sit;Locomotion Level    Examination-Participation Restrictions Cleaning;Community Activity;Yard Work;Occupation    Stability/Clinical Decision Making Stable/Uncomplicated    Rehab Potential Good    PT Frequency 2x / week    PT Duration 6 weeks    PT Treatment/Interventions ADLs/Self Care Home Management;Aquatic Therapy;Cryotherapy;Electrical Stimulation;DME Instruction;Ultrasound;Traction;Moist Heat;Gait training;Stair training;Functional mobility training;Therapeutic activities;Therapeutic exercise;Balance training;Patient/family education;Neuromuscular re-education;Manual techniques;Passive range of motion;Taping;Energy conservation;Dry needling;Spinal Manipulations;Joint Manipulations    PT Next Visit Plan Continue with extension focus.  Progress core strength as tolerated and f/u with lifting mechanics instruction next session.    PT Home Exercise Plan prone, prone on elbows, standing  extensions, lifting mechanics, ab set, side plank, prone press up 5/23 prone hip extension, lifting mechanics; 5/31: prone opposite arm/leg; all four single arm,  single leg lift, sit to stand,ab set ,hamstirng and knee to chest stretch.    Consulted and Agree with Plan of Care Patient           Patient will benefit from skilled therapeutic intervention in order to improve the following deficits and impairments:  Decreased activity tolerance,Decreased mobility,Decreased endurance,Decreased range of motion,Decreased strength,Difficulty walking,Increased muscle spasms,Improper body mechanics,Postural dysfunction,Pain  Visit Diagnosis: Bilateral sciatica  Difficulty in walking, not elsewhere classified  Other abnormalities of gait and mobility  Muscle weakness (generalized)  Bilateral low back pain with sciatica, sciatica laterality unspecified, unspecified chronicity     Problem List Patient Active Problem List   Diagnosis Date Noted  . Vaginal irritation 01/09/2021  . Urinary frequency 01/09/2021  . Spinal stenosis of lumbar region with neurogenic claudication 12/19/2020  . Disc displacement, lumbar 11/12/2020  . Essential (primary) hypertension 11/12/2020  . ASCUS of cervix with negative high risk HPV 09/21/2020  . Encounter for gynecological examination with Papanicolaou smear of cervix 09/18/2020  . Sciatica 08/30/2020  . Mild asthma without complication 16/05/9603  . Tobacco abuse 07/03/2020  . Migraines 01/05/2013   Teena Irani, PTA/CLT [phone]  Teena Irani 01/24/2021, 5:37 PM  Kaylee Miller [address] [place], [place], [address] Phone: [phone]   Fax:  [phone]  Name: Kaylee Miller MRN: [id number] Date of Birth: 08/29/1990

## 2021-01-28 ENCOUNTER — Ambulatory Visit (HOSPITAL_COMMUNITY): Payer: Medicaid Other

## 2021-01-30 ENCOUNTER — Ambulatory Visit (HOSPITAL_COMMUNITY): Payer: Medicaid Other | Admitting: Physical Therapy

## 2021-01-30 NOTE — Telephone Encounter (Signed)
Patient picked up forms.

## 2021-02-04 ENCOUNTER — Other Ambulatory Visit: Payer: Self-pay

## 2021-02-04 ENCOUNTER — Ambulatory Visit (HOSPITAL_COMMUNITY): Payer: Medicaid Other

## 2021-02-04 ENCOUNTER — Encounter (HOSPITAL_COMMUNITY): Payer: Self-pay

## 2021-02-04 DIAGNOSIS — M5440 Lumbago with sciatica, unspecified side: Secondary | ICD-10-CM | POA: Diagnosis not present

## 2021-02-04 DIAGNOSIS — R2689 Other abnormalities of gait and mobility: Secondary | ICD-10-CM

## 2021-02-04 DIAGNOSIS — M6281 Muscle weakness (generalized): Secondary | ICD-10-CM | POA: Diagnosis not present

## 2021-02-04 DIAGNOSIS — R262 Difficulty in walking, not elsewhere classified: Secondary | ICD-10-CM

## 2021-02-04 DIAGNOSIS — M5432 Sciatica, left side: Secondary | ICD-10-CM | POA: Diagnosis not present

## 2021-02-04 DIAGNOSIS — M5431 Sciatica, right side: Secondary | ICD-10-CM | POA: Diagnosis not present

## 2021-02-04 NOTE — Therapy (Signed)
[place], [place], [address] Phone: [phone]   Fax:  [phone]  Physical Therapy Treatment and D/C Summary  Patient Details  Name: Kaylee Miller MRN: [id number] Date of Birth: 11/18/1990 Referring Provider (PT): Ashok Pall, MD  PHYSICAL THERAPY DISCHARGE SUMMARY  Visits from Start of Care: 9  Current functional level related to goals / functional outcomes: Pt demo improved status at end of care and met all but 1 LTG   Remaining deficits: Has some peripheral symptoms in BLE still manifest but far less frequency and intensity   Education / Equipment: Comprehensive HEP   Patient agrees to discharge. Patient goals were partially met. Patient is being discharged due to being pleased with the current functional level.  Encounter Date: 02/04/2021   PT End of Session - 02/04/21 1310     Visit Number 9    Number of Visits 12    Date for PT Re-Evaluation 02/05/21    Authorization Type [place] [employer]    Authorization Time Period 5/4-6/14    Authorization - Visit Number 9    Authorization - Number of Visits 12    Progress Note Due on Visit 10    PT Start Time 6606    PT Stop Time 1345    PT Time Calculation (min) 41 min    Activity Tolerance Patient tolerated treatment well    Behavior During Therapy [facility] for tasks assessed/performed             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    S/P C-section 07/28/2013   Sciatic nerve pain    Seasonal allergies     Past Surgical History:  Procedure Laterality Date   adenoids     CESAREAN SECTION  2007   CESAREAN SECTION N/A 07/28/2013   Procedure: CESAREAN SECTION;  Surgeon: Jonnie Kind, MD;  Location: [facility];  Service: Obstetrics;  Laterality: N/A;    tubes in ears      There were no vitals filed for this visit.   Subjective Assessment - 02/04/21 1308     Subjective Pt reports her back pain has improved and main area of discomfort is along the backs of her thighs below the buttocks    Currently in Pain? Yes    Pain Score 1     Pain Orientation Right;Left;Posterior    Pain Descriptors / Indicators Aching                [facility] PT Assessment - 02/04/21 0001       Assessment   Medical Diagnosis M51.26 disc displacement, lumbar    Referring Provider (PT) Ashok Pall, MD                           [facility] Adult PT Treatment/Exercise - 02/04/21 0001       Lumbar Exercises: Stretches   Prone on Elbows Stretch Limitations 3 minutes    Press Ups 20 reps;5 seconds      Lumbar Exercises: Supine   Ab Set 15 reps;2 seconds    Bent Knee Raise 10 reps      Lumbar Exercises: Quadruped   Madcat/Old Horse 20 reps    Straight Leg Raise  15 reps                    PT Education - 02/04/21 1331     Education Details education in HEP additions, principles of centralization of symptoms and progressing core stability    Person(s) Educated Patient    Methods Explanation;Demonstration;Handout    Comprehension Verbalized understanding;Returned demonstration              PT Short Term Goals - 02/04/21 1342       PT SHORT TERM GOAL #1   Title Patient will report at least 25% improvement in symptoms for improved quality of life.    Baseline 50%    Time 3    Period Weeks    Status Achieved    Target Date 01/15/21      PT SHORT TERM GOAL #2   Title Patient will be independent with HEP in order to improve functional outcomes.    Time 3    Period Weeks    Status Achieved    Target Date 01/15/21      PT SHORT TERM GOAL #3   Title Patient will report pain not exceeding 3/10 with functional lift in order to improve activity tolerance    Baseline 1-2/10    Time 3    Period Weeks    Status Achieved     Target Date 01/15/21               PT Long Term Goals - 02/04/21 1342       PT LONG TERM GOAL #1   Title Patient will report at least 50% improvement in overall symptoms and function to demonstrate overall improved functional ability    Baseline 50% improvement    Time 6    Period Weeks    Status Achieved      PT LONG TERM GOAL #2   Title Patient will demo proper lifting form over 5 observations and with pain not exceeding 1/10 in order to improve activity tolerance    Baseline good squat lift demonstrated, 1-2/10 pain    Time 6    Period Weeks    Status Achieved      PT LONG TERM GOAL #3   Title Patient will demo core strength of 4/5 in order to improve trunk stability and reduce risk for re-injury    Baseline 3-/5 with quick onset of fatigue    Time 6    Period Weeks    Status Not Met      PT LONG TERM GOAL #4   Title Patient will demo and report independence in self-management techniuqes to reduce peripheralization of pain in BLE    Baseline independent HEP    Time 6    Period Weeks    Status Achieved                   Plan - 02/04/21 1340     Clinical Impression Statement Pt reports her back and leg pain has significantly improved and notes increased centralization of symptoms as compared to start of care.  Continues to exhibit trunk/core weakness but exhibits good return demonstration for strengthening exercises albeit with quick onset of fatigue.  Pt reports she feels confident in trying HEP on her own at this time and understands principles behind her condition and has f/u with MD    Personal Factors and Comorbidities Time since onset of injury/illness/exacerbation;Profession    Examination-Activity Limitations [place];Caring for Others;Carry;Lift;Stand;Squat;Sit;Locomotion Level  Examination-Participation Restrictions Cleaning;Community Activity;Yard Work;Occupation    Stability/Clinical Decision Making Stable/Uncomplicated    Rehab Potential Good     PT Frequency 2x / week    PT Duration 6 weeks    PT Treatment/Interventions ADLs/Self Care Home Management;Aquatic Therapy;Cryotherapy;Electrical Stimulation;DME Instruction;Ultrasound;Traction;Moist Heat;Gait training;Stair training;Functional mobility training;Therapeutic activities;Therapeutic exercise;Balance training;Patient/family education;Neuromuscular re-education;Manual techniques;Passive range of motion;Taping;Energy conservation;Dry needling;Spinal Manipulations;Joint Manipulations    PT Next Visit Plan D/C to HEP    PT Home Exercise Plan prone, prone on elbows, standing extensions, lifting mechanics, ab set, side plank, prone press up 5/23 prone hip extension, lifting mechanics; 5/31: prone opposite arm/leg; all four single arm, single leg lift, sit to stand,ab set ,hamstirng and knee to chest stretch.    Consulted and Agree with Plan of Care Patient             Patient will benefit from skilled therapeutic intervention in order to improve the following deficits and impairments:  Decreased activity tolerance, Decreased mobility, Decreased endurance, Decreased range of motion, Decreased strength, Difficulty walking, Increased muscle spasms, Improper body mechanics, Postural dysfunction, Pain  Visit Diagnosis: Bilateral sciatica  Difficulty in walking, not elsewhere classified  Other abnormalities of gait and mobility  Muscle weakness (generalized)  Bilateral low back pain with sciatica, sciatica laterality unspecified, unspecified chronicity     Problem List Patient Active Problem List   Diagnosis Date Noted   Vaginal irritation 01/09/2021   Urinary frequency 01/09/2021   Spinal stenosis of lumbar region with neurogenic claudication 12/19/2020   Disc displacement, lumbar 11/12/2020   Essential (primary) hypertension 11/12/2020   ASCUS of cervix with negative high risk HPV 09/21/2020   Encounter for gynecological examination with Papanicolaou smear of cervix  09/18/2020   Sciatica 08/30/2020   Mild asthma without complication 58/52/7782   Tobacco abuse 07/03/2020   Migraines 01/05/2013   1:45 PM, 02/04/21 M. Sherlyn Lees, PT, DPT Physical Therapist- [place] Office Number: [phone]   [place] [address] [place], [place], [address] Phone: [phone]   Fax:  [phone]  Name: Kaylee Miller MRN: [id number] Date of Birth: 22-Sep-1990

## 2021-02-06 ENCOUNTER — Encounter (HOSPITAL_COMMUNITY): Payer: Medicaid Other

## 2021-02-08 DIAGNOSIS — M5126 Other intervertebral disc displacement, lumbar region: Secondary | ICD-10-CM | POA: Diagnosis not present

## 2021-02-08 DIAGNOSIS — M5416 Radiculopathy, lumbar region: Secondary | ICD-10-CM | POA: Diagnosis not present

## 2021-02-28 ENCOUNTER — Ambulatory Visit: Payer: Medicaid Other

## 2021-03-04 ENCOUNTER — Ambulatory Visit (INDEPENDENT_AMBULATORY_CARE_PROVIDER_SITE_OTHER): Payer: Medicaid Other | Admitting: *Deleted

## 2021-03-04 ENCOUNTER — Other Ambulatory Visit: Payer: Self-pay

## 2021-03-04 DIAGNOSIS — Z308 Encounter for other contraceptive management: Secondary | ICD-10-CM

## 2021-03-04 MED ORDER — MEDROXYPROGESTERONE ACETATE 150 MG/ML IM SUSP
150.0000 mg | Freq: Once | INTRAMUSCULAR | Status: AC
  Administered 2021-03-04: 150 mg via INTRAMUSCULAR

## 2021-03-04 NOTE — Progress Notes (Signed)
   NURSE VISIT- INJECTION  SUBJECTIVE:  Kaylee Miller is a 30 y.o. [id number] female here for a Depo Provera for contraception/period management. She is a GYN patient.   OBJECTIVE:  There were no vitals taken for this visit.  Appears well, in no apparent distress  Injection administered in: Left upper quad. gluteus  Meds ordered this encounter  Medications   medroxyPROGESTERone (DEPO-PROVERA) injection 150 mg    ASSESSMENT: GYN patient Depo Provera for contraception/period management PLAN: Follow-up: in 11-13 weeks for next Depo   Levy Pupa  03/04/2021 4:38 PM

## 2021-03-15 ENCOUNTER — Ambulatory Visit
Admission: EM | Admit: 2021-03-15 | Discharge: 2021-03-15 | Disposition: A | Discharge status: Home / Self Care | Payer: Medicaid Other | Attending: Emergency Medicine | Admitting: Emergency Medicine

## 2021-03-15 ENCOUNTER — Encounter: Payer: Self-pay | Admitting: Emergency Medicine

## 2021-03-15 ENCOUNTER — Other Ambulatory Visit: Payer: Self-pay

## 2021-03-15 ENCOUNTER — Ambulatory Visit: Payer: Medicaid Other

## 2021-03-15 DIAGNOSIS — M545 Low back pain, unspecified: Secondary | ICD-10-CM | POA: Diagnosis not present

## 2021-03-15 DIAGNOSIS — M25551 Pain in right hip: Secondary | ICD-10-CM

## 2021-03-15 MED ORDER — PREDNISONE 10 MG (21) PO TBPK: ORAL_TABLET | Freq: Every day | ORAL | 0 refills | Status: DC

## 2021-03-15 NOTE — Discharge Instructions (Addendum)
Continue conservative management of rest, ice, and gentle stretches Prednisone prescribed.  Take as directed and to completion  Follow up with orthopedist and neurosurgeon for hip and back pain Return or go to the ER if you have any new or worsening symptoms (fever, chills, chest pain, abdominal pain, changes in bowel or bladder habits, pain radiating into lower legs, etc...)

## 2021-03-15 NOTE — ED Triage Notes (Signed)
Right hip pain after physical therapy for back.  Has been taken naprosyn with out relief.

## 2021-03-15 NOTE — ED Provider Notes (Signed)
[place]   [id number] 03/15/21 Arrival Time: 1536  [id number] PAIN  SUBJECTIVE: History from: patient. Kaylee Miller is a 30 y.o. female complains of RT low back and hip pain for the past month.  Symptoms began after going to physical therapy for her back.   Localizes the pain to the RT low back and hip.  Describes the pain as constant and dull in character.  Has tried OTC medications without relief.  Symptoms are made worse with sitting, and laying on RT side.  Denies similar symptoms in the past.  Denies fever, chills, erythema, ecchymosis, effusion, weakness, numbness and tingling, saddle paresthesias, loss of bowel or bladder function.      ROS: As per HPI.  All other pertinent ROS negative.     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    S/P C-section 07/28/2013   Sciatic nerve pain    Seasonal allergies    Past Surgical History:  Procedure Laterality Date   adenoids     CESAREAN SECTION  2007   CESAREAN SECTION N/A 07/28/2013   Procedure: CESAREAN SECTION;  Surgeon: Jonnie Kind, MD;  Location: [facility];  Service: Obstetrics;  Laterality: N/A;   tubes in ears     Allergies  Allergen Reactions   Latex Swelling   No current facility-administered medications on file prior to encounter.   Current Outpatient Medications on File Prior to Encounter  Medication Sig Dispense Refill   acetaminophen (TYLENOL) 500 MG tablet Take 1,500 mg every 6 (six) hours as needed by mouth for moderate pain or headache.     albuterol (VENTOLIN HFA) 108 (90 Base) MCG/ACT inhaler Inhale 2 puffs into the lungs every 6 (six) hours as needed for wheezing or shortness of breath. 6.7 g 0   Aspirin-Acetaminophen-Caffeine (EXCEDRIN MIGRAINE PO) Take by mouth.     chlorhexidine (PERIDEX) 0.12 % solution Use as  directed 15 mLs in the mouth or throat 2 (two) times daily. 120 mL 0   ibuprofen (ADVIL) 800 MG tablet Take 1 tablet (800 mg total) by mouth every 8 (eight) hours as needed for moderate pain. 21 tablet 0   meclizine (ANTIVERT) 25 MG tablet Take 1 tablet (25 mg total) by mouth 3 (three) times daily as needed for dizziness. 30 tablet 0   medroxyPROGESTERone Acetate 150 MG/ML SUSY INJECT 1 ML INTO THE MUSCLE EVERY 3 MONTHS 1 mL 3   naproxen (NAPROSYN) 500 MG tablet Take 1 tablet (500 mg total) by mouth 2 (two) times daily. (Patient taking differently: Take 500 mg by mouth as needed.) 30 tablet 0   ondansetron (ZOFRAN ODT) 4 MG disintegrating tablet Take 1 tablet (4 mg total) by mouth every 8 (eight) hours as needed for nausea or vomiting. 20 tablet 0   tiZANidine (ZANAFLEX) 4 MG tablet Take 1 tablet (4 mg total) by mouth every 6 (six) hours as needed for muscle spasms. 30 tablet 0   Vitamin D, Ergocalciferol, (DRISDOL) 1.25 MG (50000 UNIT) CAPS capsule Take 1 capsule (50,000 Units total) by mouth every 7 (seven) days. 5 capsule 5   Social History   Socioeconomic History   Marital status: Single    Spouse name: Not on file   Number of children: Not on file   Years of education:  Not on file   Highest education level: Not on file  Occupational History   Not on file  Tobacco Use   Smoking status: Some Days    Packs/day: 1.00    Years: 9.00    Pack years: 9.00    Types: Cigarettes   Smokeless tobacco: Never  Vaping Use   Vaping Use: Never used  Substance and Sexual Activity   Alcohol use: Yes    Alcohol/week: 0.0 standard drinks   Drug use: Never   Sexual activity: Not Currently    Birth control/protection: Injection  Other Topics Concern   Not on file  Social History Narrative   Not on file   Social Determinants of Health   Financial Resource Strain: Medium Risk   Difficulty of Paying Living Expenses: Somewhat hard  Food Insecurity: Food Insecurity Present   Worried About Running  Out of Food in the Last Year: Sometimes true   Ran Out of Food in the Last Year: Never true  Transportation Needs: No Transportation Needs   Lack of Transportation (Medical): No   Lack of Transportation (Non-Medical): No  Physical Activity: Sufficiently Active   Days of Exercise per Week: 5 days   Minutes of Exercise per Session: 100 min  Stress: No Stress Concern Present   Feeling of Stress : Only a little  Social Connections: Moderately Integrated   Frequency of Communication with Friends and Family: More than three times a week   Frequency of Social Gatherings with Friends and Family: Twice a week   Attends Religious Services: More than 4 times per year   Active Member of [employer] or Organizations: Yes   Attends Archivist Meetings: 1 to 4 times per year   Marital Status: Never married  Human resources officer Violence: Unknown   Fear of Current or Ex-Partner: No   Emotionally Abused: Patient refused   Physically Abused: No   Sexually Abused: No   Family History  Problem Relation Age of Onset   Cancer Mother    Breast cancer Mother    Hypertension Father    Stroke Father    CAD Maternal Grandfather    CAD Paternal Grandmother    CAD Paternal Grandfather    Diabetes Other        great-great grandmother   CAD Other     OBJECTIVE:  Vitals:   03/15/21 1604  BP: 99/65  Pulse: 80  Resp: 14  Temp: 98.1 F (36.7 C)  TempSrc: Tympanic  SpO2: 98%    General appearance: ALERT; in no acute distress.  Head: NCAT Lungs: Normal respiratory effort Musculoskeletal: Back and hip Inspection: Skin warm, dry, clear and intact without obvious erythema, effusion, or ecchymosis.  Palpation: TTP over RT low back, over buttock, and lateral and anterior hip ROM: FROM active and passive Strength: 5/5 hip flexion, 5/5 hip extension Skin: warm and dry Neurologic: Ambulates without difficulty; Sensation intact about the lower extremities Psychological: alert and cooperative; normal  mood and affect   ASSESSMENT & PLAN:  1. Acute right-sided low back pain without sciatica   2. Right hip pain      Meds ordered this encounter  Medications   predniSONE (STERAPRED UNI-PAK 21 TAB) 10 MG (21) TBPK tablet    Sig: Take by mouth daily. Take 6 tabs by mouth daily  for 2 days, then 5 tabs for 2 days, then 4 tabs for 2 days, then 3 tabs for 2 days, 2 tabs for 2 days, then 1 tab by mouth daily  for 2 days    Dispense:  42 tablet    Refill:  0    Order Specific Question:   Supervising Provider    Answer:   Raylene Everts [id number]    Continue conservative management of rest, ice, and gentle stretches Prednisone prescribed.  Take as directed and to completion  Follow up with orthopedist and neurosurgeon for hip and back pain Return or go to the ER if you have any new or worsening symptoms (fever, chills, chest pain, abdominal pain, changes in bowel or bladder habits, pain radiating into lower legs, etc...)    Reviewed expectations re: course of current medical issues. Questions answered. Outlined signs and symptoms indicating need for more acute intervention. Patient verbalized understanding. After Visit Summary given.     Lestine Box, PA-C 03/15/21 1706

## 2021-03-21 ENCOUNTER — Ambulatory Visit: Payer: Medicaid Other | Admitting: Internal Medicine

## 2021-03-29 ENCOUNTER — Ambulatory Visit
Admission: EM | Admit: 2021-03-29 | Discharge: 2021-03-29 | Disposition: A | Discharge status: Home / Self Care | Payer: Medicaid Other | Attending: Emergency Medicine | Admitting: Emergency Medicine

## 2021-03-29 ENCOUNTER — Encounter: Payer: Self-pay | Admitting: Emergency Medicine

## 2021-03-29 ENCOUNTER — Other Ambulatory Visit: Payer: Self-pay

## 2021-03-29 DIAGNOSIS — K6289 Other specified diseases of anus and rectum: Secondary | ICD-10-CM

## 2021-03-29 DIAGNOSIS — K649 Unspecified hemorrhoids: Secondary | ICD-10-CM | POA: Diagnosis not present

## 2021-03-29 MED ORDER — HYDROCORTISONE ACETATE 25 MG RE SUPP: 25.0000 mg | Freq: Two times a day (BID) | RECTAL | 0 refills | Status: DC

## 2021-03-29 NOTE — ED Provider Notes (Signed)
[place]   [id number] 03/29/21 Arrival Time: 0926  CC: Hemorrhoid  SUBJECTIVE:  Kaylee Miller is a 30 y.o. female who presents with complaint of rectal pain x few days.  Reports hemorrhoids.  Localizes pain to rectum.  Describes as intermittent and achy in character.  Has tried OTC medications with/ without relief.  Denies alleviating factors.  Worse with working and wearing underwear.  Reports similar symptoms in the past.    Denies fever, chills, nausea, vomiting, chest pain, SOB, diarrhea, constipation, hematochezia, melena, dysuria, difficulty urinating, increased frequency or urgency, flank pain, loss of bowel or bladder function.  No LMP recorded. Patient has had an injection.  ROS: As per HPI.  All other pertinent ROS negative.     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    S/P C-section 07/28/2013   Sciatic nerve pain    Seasonal allergies    Past Surgical History:  Procedure Laterality Date   adenoids     CESAREAN SECTION  2007   CESAREAN SECTION N/A 07/28/2013   Procedure: CESAREAN SECTION;  Surgeon: Jonnie Kind, MD;  Location: [facility];  Service: Obstetrics;  Laterality: N/A;   tubes in ears     Allergies  Allergen Reactions   Latex Swelling   No current facility-administered medications on file prior to encounter.   Current Outpatient Medications on File Prior to Encounter  Medication Sig Dispense Refill   acetaminophen (TYLENOL) 500 MG tablet Take 1,500 mg every 6 (six) hours as needed by mouth for moderate pain or headache.     albuterol (VENTOLIN HFA) 108 (90 Base) MCG/ACT inhaler Inhale 2 puffs into the lungs every 6 (six) hours as needed for wheezing or shortness of breath. 6.7 g 0   Aspirin-Acetaminophen-Caffeine (EXCEDRIN MIGRAINE PO) Take by mouth.      chlorhexidine (PERIDEX) 0.12 % solution Use as directed 15 mLs in the mouth or throat 2 (two) times daily. 120 mL 0   ibuprofen (ADVIL) 800 MG tablet Take 1 tablet (800 mg total) by mouth every 8 (eight) hours as needed for moderate pain. 21 tablet 0   meclizine (ANTIVERT) 25 MG tablet Take 1 tablet (25 mg total) by mouth 3 (three) times daily as needed for dizziness. 30 tablet 0   medroxyPROGESTERone Acetate 150 MG/ML SUSY INJECT 1 ML INTO THE MUSCLE EVERY 3 MONTHS 1 mL 3   naproxen (NAPROSYN) 500 MG tablet Take 1 tablet (500 mg total) by mouth 2 (two) times daily. (Patient taking differently: Take 500 mg by mouth as needed.) 30 tablet 0   ondansetron (ZOFRAN ODT) 4 MG disintegrating tablet Take 1 tablet (4 mg total) by mouth every 8 (eight) hours as needed for nausea or vomiting. 20 tablet 0   predniSONE (STERAPRED UNI-PAK 21 TAB) 10 MG (21) TBPK tablet Take by mouth daily. Take 6 tabs by mouth daily  for 2 days, then 5 tabs for 2 days, then 4 tabs for 2 days, then 3 tabs for 2 days, 2 tabs for 2 days, then 1 tab by mouth daily for 2 days 42 tablet 0   tiZANidine (ZANAFLEX) 4 MG tablet Take 1 tablet (4 mg total) by mouth every 6 (six) hours as needed for muscle spasms. 30 tablet 0   Vitamin D, Ergocalciferol, (  DRISDOL) 1.25 MG (50000 UNIT) CAPS capsule Take 1 capsule (50,000 Units total) by mouth every 7 (seven) days. 5 capsule 5   Social History   Socioeconomic History   Marital status: Single    Spouse name: Not on file   Number of children: Not on file   Years of education: Not on file   Highest education level: Not on file  Occupational History   Not on file  Tobacco Use   Smoking status: Some Days    Packs/day: 1.00    Years: 9.00    Pack years: 9.00    Types: Cigarettes   Smokeless tobacco: Never  Vaping Use   Vaping Use: Never used  Substance and Sexual Activity   Alcohol use: Yes    Alcohol/week: 0.0 standard drinks   Drug use: Never   Sexual activity: Not Currently     Birth control/protection: Injection  Other Topics Concern   Not on file  Social History Narrative   Not on file   Social Determinants of Health   Financial Resource Strain: Medium Risk   Difficulty of Paying Living Expenses: Somewhat hard  Food Insecurity: Food Insecurity Present   Worried About Running Out of Food in the Last Year: Sometimes true   Ran Out of Food in the Last Year: Never true  Transportation Needs: No Transportation Needs   Lack of Transportation (Medical): No   Lack of Transportation (Non-Medical): No  Physical Activity: Sufficiently Active   Days of Exercise per Week: 5 days   Minutes of Exercise per Session: 100 min  Stress: No Stress Concern Present   Feeling of Stress : Only a little  Social Connections: Moderately Integrated   Frequency of Communication with Friends and Family: More than three times a week   Frequency of Social Gatherings with Friends and Family: Twice a week   Attends Religious Services: More than 4 times per year   Active Member of [employer] or Organizations: Yes   Attends Archivist Meetings: 1 to 4 times per year   Marital Status: Never married  Human resources officer Violence: Unknown   Fear of Current or Ex-Partner: No   Emotionally Abused: Patient refused   Physically Abused: No   Sexually Abused: No   Family History  Problem Relation Age of Onset   Cancer Mother    Breast cancer Mother    Hypertension Father    Stroke Father    CAD Maternal Grandfather    CAD Paternal Grandmother    CAD Paternal Grandfather    Diabetes Other        great-great grandmother   CAD Other      OBJECTIVE:  Vitals:   03/29/21 1015  BP: 120/73  Pulse: 82  Resp: 19  Temp: 98.9 F (37.2 C)  TempSrc: Oral  SpO2: 98%    General appearance: Alert; NAD HEENT: NCAT.  Oropharynx clear.  Lungs: clear to auscultation bilaterally without adventitious breath sounds Heart: regular rate and rhythm.   Abdomen: soft, non-distended; normal  active bowel sounds; non-tender to light and deep palpation; nontender at McBurney's point; no guarding Rectal: thrombosed hemorrhoid in 12 o'clock position, TTP, residual hemorrhoid tag in 7 o'clock position Extremities: no edema; symmetrical with no gross deformities Skin: warm and dry Neurologic: normal gait Psychological: alert and cooperative; normal mood and affect   ASSESSMENT & PLAN:  1. Hemorrhoids, unspecified hemorrhoid type   2. Rectal discomfort     Meds ordered this encounter  Medications   hydrocortisone (  ANUSOL-HC) 25 MG suppository    Sig: Place 1 suppository (25 mg total) rectally 2 (two) times daily.    Dispense:  12 suppository    Refill:  0    Order Specific Question:   Supervising Provider    Answer:   Raylene Everts [id number]    Perform sitz water baths Eat a diet high if fiber and drink plenty of water Begin taking OTC miralax to help soften stool You may find relief in symptoms with using OTC dermablast spray as well as tucks pads Prescribed anusol suppositories.   Use medication as prescribed for symptomatic relief Follow up with PCP if symptoms persists Go to the ER if you have any new or worsening symptoms fever, chills, nausea, vomiting, abdominal pain, changes in bowel or bladder function, etc...  Reviewed expectations re: course of current medical issues. Questions answered. Outlined signs and symptoms indicating need for more acute intervention. Patient verbalized understanding. After Visit Summary given.   Lestine Box, PA-C 03/29/21 1052

## 2021-03-29 NOTE — ED Triage Notes (Signed)
Hemorrhoids for the past couple of days.  Unable to go to work today.  Using otc creams with no relief.

## 2021-03-29 NOTE — Discharge Instructions (Addendum)
Perform sitz water baths Eat a diet high if fiber and drink plenty of water Begin taking OTC miralax to help soften stool You may find relief in symptoms with using OTC dermablast spray as well as tucks pads Prescribed anusol suppositories.   Use medication as prescribed for symptomatic relief Follow up with PCP if symptoms persists Go to the ER if you have any new or worsening symptoms fever, chills, nausea, vomiting, abdominal pain, changes in bowel or bladder function, etc..Marland Kitchen

## 2021-04-01 DIAGNOSIS — R42 Dizziness and giddiness: Secondary | ICD-10-CM | POA: Diagnosis not present

## 2021-04-01 DIAGNOSIS — H6123 Impacted cerumen, bilateral: Secondary | ICD-10-CM | POA: Diagnosis not present

## 2021-04-01 DIAGNOSIS — H903 Sensorineural hearing loss, bilateral: Secondary | ICD-10-CM | POA: Diagnosis not present

## 2021-04-05 ENCOUNTER — Encounter: Payer: Self-pay | Admitting: Nurse Practitioner

## 2021-04-05 ENCOUNTER — Ambulatory Visit (INDEPENDENT_AMBULATORY_CARE_PROVIDER_SITE_OTHER): Payer: Medicaid Other | Admitting: Nurse Practitioner

## 2021-04-05 ENCOUNTER — Other Ambulatory Visit: Payer: Self-pay

## 2021-04-05 VITALS — BP 102/65 | HR 94 | Ht 67.0 in | Wt 189.0 lb

## 2021-04-05 DIAGNOSIS — K649 Unspecified hemorrhoids: Secondary | ICD-10-CM | POA: Diagnosis not present

## 2021-04-05 DIAGNOSIS — M25551 Pain in right hip: Secondary | ICD-10-CM

## 2021-04-05 MED ORDER — HYDROCORTISONE (PERIANAL) 2.5 % EX CREA: 1.0000 "application " | TOPICAL_CREAM | Freq: Two times a day (BID) | CUTANEOUS | 0 refills | Status: DC

## 2021-04-05 NOTE — Progress Notes (Signed)
Acute Office Visit  Subjective:    Patient ID: Kaylee Miller, female    DOB: [birth year], 30 y.o.   MRN: [id number]  Chief Complaint  Patient presents with   Hemorrhoids    Ongoing since Weds of last week, external and painful.    Hip Pain    R hip pain, ongoing x3 months. Did have back injections in June for her back and it helped with her hip but is now having the pain again. Hurts worse to stand up.     Hip Pain   Patient is in today for hemorrhoids and back/hip pain.  She was referred to Dr. Luna Miller and Dr. Cyndy Miller. She has been doing physical therapy. In June she got back injections, and her hips felt better. Her pain is in her right hip and radiates into her thighs.  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    S/P C-section 07/28/2013   Sciatic nerve pain    Seasonal allergies     Past Surgical History:  Procedure Laterality Date   adenoids     CESAREAN SECTION  2007   CESAREAN SECTION N/A 07/28/2013   Procedure: CESAREAN SECTION;  Surgeon: Kaylee Kind, MD;  Location: [facility];  Service: Obstetrics;  Laterality: N/A;   tubes in ears      Family History  Problem Relation Age of Onset   Cancer Mother    Breast cancer Mother    Hypertension Father    Stroke Father    CAD Maternal Grandfather    CAD Paternal Grandmother    CAD Paternal Grandfather    Diabetes Other        great-great grandmother   CAD Other     Social History   Socioeconomic History   Marital status: Single    Spouse name: Not on file   Number of children: Not on file   Years of education: Not on file   Highest education level: Not on file  Occupational History   Not on file  Tobacco Use   Smoking status: Some Days    Packs/day: 1.00    Years: 9.00    Pack years: 9.00    Types: Cigarettes    Smokeless tobacco: Never  Vaping Use   Vaping Use: Never used  Substance and Sexual Activity   Alcohol use: Yes    Alcohol/week: 0.0 standard drinks   Drug use: Never   Sexual activity: Not Currently    Birth control/protection: Injection  Other Topics Concern   Not on file  Social History Narrative   Not on file   Social Determinants of Health   Financial Resource Strain: Medium Risk   Difficulty of Paying Living Expenses: Somewhat hard  Food Insecurity: Food Insecurity Present   Worried About Running Out of Food in the Last Year: Sometimes true   Ran Out of Food in the Last Year: Never true  Transportation Needs: No Transportation Needs   Lack of Transportation (Medical): No   Lack of Transportation (Non-Medical): No  Physical Activity: Sufficiently Active   Days of Exercise per Week: 5 days   Minutes of Exercise per Session: 100 min  Stress: No Stress Concern Present   Feeling of Stress : Only a little  Social Connections: Moderately Integrated  Frequency of Communication with Friends and Family: More than three times a week   Frequency of Social Gatherings with Friends and Family: Twice a week   Attends Religious Services: More than 4 times per year   Active Member of [employer] or Organizations: Yes   Attends Archivist Meetings: 1 to 4 times per year   Marital Status: Never married  Human resources officer Violence: Unknown   Fear of Current or Ex-Partner: No   Emotionally Abused: Patient refused   Physically Abused: No   Sexually Abused: No    Outpatient Medications Prior to Visit  Medication Sig Dispense Refill   acetaminophen (TYLENOL) 500 MG tablet Take 1,500 mg every 6 (six) hours as needed by mouth for moderate pain or headache.     albuterol (VENTOLIN HFA) 108 (90 Base) MCG/ACT inhaler Inhale 2 puffs into the lungs every 6 (six) hours as needed for wheezing or shortness of breath. 6.7 g 0   Aspirin-Acetaminophen-Caffeine (EXCEDRIN MIGRAINE PO) Take by mouth.      ibuprofen (ADVIL) 800 MG tablet Take 1 tablet (800 mg total) by mouth every 8 (eight) hours as needed for moderate pain. 21 tablet 0   meclizine (ANTIVERT) 25 MG tablet Take 1 tablet (25 mg total) by mouth 3 (three) times daily as needed for dizziness. 30 tablet 0   medroxyPROGESTERone Acetate 150 MG/ML SUSY INJECT 1 ML INTO THE MUSCLE EVERY 3 MONTHS 1 mL 3   naproxen (NAPROSYN) 500 MG tablet Take 1 tablet (500 mg total) by mouth 2 (two) times daily. (Patient taking differently: Take 500 mg by mouth as needed.) 30 tablet 0   ondansetron (ZOFRAN ODT) 4 MG disintegrating tablet Take 1 tablet (4 mg total) by mouth every 8 (eight) hours as needed for nausea or vomiting. 20 tablet 0   tiZANidine (ZANAFLEX) 4 MG tablet Take 1 tablet (4 mg total) by mouth every 6 (six) hours as needed for muscle spasms. 30 tablet 0   Vitamin D, Ergocalciferol, (DRISDOL) 1.25 MG (50000 UNIT) CAPS capsule Take 1 capsule (50,000 Units total) by mouth every 7 (seven) days. 5 capsule 5   chlorhexidine (PERIDEX) 0.12 % solution Use as directed 15 mLs in the mouth or throat 2 (two) times daily. (Patient not taking: Reported on 04/05/2021) 120 mL 0   hydrocortisone (ANUSOL-HC) 25 MG suppository Place 1 suppository (25 mg total) rectally 2 (two) times daily. (Patient not taking: Reported on 04/05/2021) 12 suppository 0   predniSONE (STERAPRED UNI-PAK 21 TAB) 10 MG (21) TBPK tablet Take by mouth daily. Take 6 tabs by mouth daily  for 2 days, then 5 tabs for 2 days, then 4 tabs for 2 days, then 3 tabs for 2 days, 2 tabs for 2 days, then 1 tab by mouth daily for 2 days (Patient not taking: Reported on 04/05/2021) 42 tablet 0   No facility-administered medications prior to visit.    Allergies  Allergen Reactions   Latex Swelling    Review of Systems  Constitutional: Negative.   Gastrointestinal:        Hemorrhoid pain  Musculoskeletal:  Positive for arthralgias and back pain.      Objective:    Physical  Exam Constitutional:      Appearance: Normal appearance.  Abdominal:     Comments: External Hemorrhoid present  Neurological:     Mental Status: She is alert.    BP 102/65 (BP Location: Left Arm, Patient Position: Sitting, Cuff Size: Large)   Pulse 94   Ht  $'5\' 7"'z$  (1.702 m)   Wt 189 lb (85.7 kg)   SpO2 97%   BMI 29.60 kg/m  Wt Readings from Last 3 Encounters:  04/05/21 189 lb (85.7 kg)  01/23/21 189 lb (85.7 kg)  01/09/21 189 lb 3.2 oz (85.8 kg)    Health Maintenance Due  Topic Date Due   Hepatitis C Screening  Never done   INFLUENZA VACCINE  03/25/2021    There are no preventive care reminders to display for this patient.   Lab Results  Component Value Date   TSH 1.300 07/03/2020   Lab Results  Component Value Date   WBC 6.6 07/03/2020   HGB 13.5 07/03/2020   HCT 40.9 07/03/2020   MCV 84 07/03/2020   PLT 222 07/03/2020   Lab Results  Component Value Date   NA 139 07/03/2020   K 4.2 07/03/2020   CO2 22 07/03/2020   GLUCOSE 84 07/03/2020   BUN 11 07/03/2020   CREATININE 0.75 07/03/2020   BILITOT 0.5 07/03/2020   ALKPHOS 57 07/03/2020   AST 13 07/03/2020   ALT 23 07/03/2020   PROT 6.6 07/03/2020   ALBUMIN 4.2 07/03/2020   CALCIUM 9.3 07/03/2020   ANIONGAP 7 01/09/2020   Lab Results  Component Value Date   CHOL 223 (H) 07/03/2020   Lab Results  Component Value Date   HDL 51 07/03/2020   Lab Results  Component Value Date   LDLCALC 158 (H) 07/03/2020   Lab Results  Component Value Date   TRIG 79 07/03/2020   Lab Results  Component Value Date   CHOLHDL 4.4 07/03/2020   No results found for: HGBA1C     Assessment & Plan:   Problem List Items Addressed This Visit       Other   Right hip pain    -referral to ortho -saw Dr. Luna Miller for back pain, was referred to Dr. Cyndy Miller; then hip started hurting and [facility] sent her back to PCP      Relevant Orders   Ambulatory referral to Orthopedic Surgery   Other Visit Diagnoses      Hemorrhoids, unspecified hemorrhoid type    -  Primary   Relevant Medications   hydrocortisone (ANUSOL-HC) 2.5 % rectal cream   Other Relevant Orders   Ambulatory referral to Gastroenterology        Meds ordered this encounter  Medications   hydrocortisone (ANUSOL-HC) 2.5 % rectal cream    Sig: Place 1 application rectally 2 (two) times daily.    Dispense:  30 g    Refill:  0     Noreene Larsson, NP

## 2021-04-05 NOTE — Patient Instructions (Signed)
The Preparation H with lidocaine helps with pain relief.  I sent in the prescription-grade hydrocortisone, and I sent a referral to GI for definitive treatment.

## 2021-04-05 NOTE — Assessment & Plan Note (Addendum)
-  referral to ortho -saw Dr. Luna Glasgow for back pain, was referred to Dr. Cyndy Freeze; then hip started hurting and [facility] sent her back to PCP

## 2021-04-09 ENCOUNTER — Other Ambulatory Visit: Payer: Self-pay | Admitting: *Deleted

## 2021-04-09 ENCOUNTER — Encounter: Payer: Self-pay | Admitting: Internal Medicine

## 2021-04-09 ENCOUNTER — Telehealth: Payer: Self-pay

## 2021-04-09 DIAGNOSIS — K649 Unspecified hemorrhoids: Secondary | ICD-10-CM

## 2021-04-09 NOTE — Telephone Encounter (Signed)
Will you reach out to gastro in [place] before I place referral and see what there first opening is

## 2021-04-09 NOTE — Telephone Encounter (Signed)
Referral placed.

## 2021-04-09 NOTE — Telephone Encounter (Signed)
Patient called requesting a referral to a gastro in [place] for hemorrhoids due to [facility] not [facility] anything sooner than December ph# [phone]

## 2021-04-10 DIAGNOSIS — R42 Dizziness and giddiness: Secondary | ICD-10-CM | POA: Diagnosis not present

## 2021-04-18 DIAGNOSIS — R42 Dizziness and giddiness: Secondary | ICD-10-CM | POA: Diagnosis not present

## 2021-04-18 DIAGNOSIS — H903 Sensorineural hearing loss, bilateral: Secondary | ICD-10-CM | POA: Diagnosis not present

## 2021-04-18 DIAGNOSIS — H832X3 Labyrinthine dysfunction, bilateral: Secondary | ICD-10-CM | POA: Diagnosis not present

## 2021-04-22 ENCOUNTER — Ambulatory Visit: Payer: Medicaid Other | Admitting: Internal Medicine

## 2021-04-23 ENCOUNTER — Ambulatory Visit: Payer: Medicaid Other | Admitting: Orthopaedic Surgery

## 2021-04-23 ENCOUNTER — Other Ambulatory Visit: Payer: Self-pay

## 2021-04-23 ENCOUNTER — Ambulatory Visit: Payer: Medicaid Other

## 2021-04-23 ENCOUNTER — Ambulatory Visit: Payer: Medicaid Other | Admitting: Internal Medicine

## 2021-04-23 ENCOUNTER — Encounter: Payer: Self-pay | Admitting: Orthopaedic Surgery

## 2021-04-23 VITALS — BP 140/89 | HR 80 | Ht 67.0 in | Wt 190.8 lb

## 2021-04-23 DIAGNOSIS — M25551 Pain in right hip: Secondary | ICD-10-CM

## 2021-04-23 DIAGNOSIS — G8929 Other chronic pain: Secondary | ICD-10-CM | POA: Diagnosis not present

## 2021-04-23 MED ORDER — DICLOFENAC SODIUM 75 MG PO TBEC: 75.0000 mg | DELAYED_RELEASE_TABLET | Freq: Two times a day (BID) | ORAL | 2 refills | Status: DC

## 2021-04-23 NOTE — Progress Notes (Signed)
My right hip hurts.  She saw the neurosurgeon and had injections for the L5 nerve root pain.  She had one in June and got better.  She has developed right hip pain.  Her neurosurgeon and family doctor asked that she come here for the hip pain.  She has no trauma, no swelling, no numbness of the right hip.  She has pain after walking a while and when getting up sometimes.  ROM of the right hip is full.  NV intact.  Gait is normal.  Encounter Diagnosis  Name Primary?   Chronic right hip pain Yes   X-rays were done of the right hip, reported separately.  I have told her the hip X-rays look good.  I will change from Naprosyn to diclofenac.  I told her some of the lower back pain may go to the right hip.  I will see how she does on the diclofenac. She may need PT.  Return in three weeks.  Call if any problem.  Precautions discussed.  Electronically Signed Sanjuana Kava, MD 8/30/20229:52 AM

## 2021-04-29 ENCOUNTER — Other Ambulatory Visit: Payer: Self-pay

## 2021-04-29 ENCOUNTER — Encounter (HOSPITAL_COMMUNITY): Payer: Self-pay

## 2021-04-29 ENCOUNTER — Emergency Department (HOSPITAL_COMMUNITY)
Admission: EM | Admit: 2021-04-29 | Discharge: 2021-04-29 | Disposition: A | Discharge status: Home / Self Care | Payer: Medicaid Other | Attending: Emergency Medicine | Admitting: Emergency Medicine

## 2021-04-29 DIAGNOSIS — J45909 Unspecified asthma, uncomplicated: Secondary | ICD-10-CM | POA: Diagnosis not present

## 2021-04-29 DIAGNOSIS — M5431 Sciatica, right side: Secondary | ICD-10-CM

## 2021-04-29 DIAGNOSIS — M545 Low back pain, unspecified: Secondary | ICD-10-CM | POA: Diagnosis not present

## 2021-04-29 DIAGNOSIS — F17210 Nicotine dependence, cigarettes, uncomplicated: Secondary | ICD-10-CM | POA: Insufficient documentation

## 2021-04-29 DIAGNOSIS — M5441 Lumbago with sciatica, right side: Secondary | ICD-10-CM | POA: Diagnosis not present

## 2021-04-29 DIAGNOSIS — Z91040 Latex allergy status: Secondary | ICD-10-CM | POA: Insufficient documentation

## 2021-04-29 DIAGNOSIS — Z7982 Long term (current) use of aspirin: Secondary | ICD-10-CM | POA: Diagnosis not present

## 2021-04-29 DIAGNOSIS — G8929 Other chronic pain: Secondary | ICD-10-CM | POA: Diagnosis not present

## 2021-04-29 MED ORDER — KETOROLAC TROMETHAMINE 60 MG/2ML IM SOLN
30.0000 mg | Freq: Once | INTRAMUSCULAR | Status: AC
  Administered 2021-04-29: 30 mg via INTRAMUSCULAR
  Filled 2021-04-29: qty 2

## 2021-04-29 MED ORDER — DEXAMETHASONE SODIUM PHOSPHATE 10 MG/ML IJ SOLN
10.0000 mg | Freq: Once | INTRAMUSCULAR | Status: AC
  Administered 2021-04-29: 10 mg via INTRAMUSCULAR
  Filled 2021-04-29: qty 1

## 2021-04-29 MED ORDER — OXYCODONE-ACETAMINOPHEN 5-325 MG PO TABS
1.0000 | ORAL_TABLET | Freq: Once | ORAL | Status: AC
  Administered 2021-04-29: 1 via ORAL
  Filled 2021-04-29: qty 1

## 2021-04-29 MED ORDER — CELECOXIB 200 MG PO CAPS: 200.0000 mg | ORAL_CAPSULE | Freq: Two times a day (BID) | ORAL | 0 refills | Status: DC

## 2021-04-29 MED ORDER — OXYCODONE HCL 5 MG PO TABS: 2.5000 mg | ORAL_TABLET | Freq: Four times a day (QID) | ORAL | 0 refills | Status: DC | PRN

## 2021-04-29 MED ORDER — METHYLPREDNISOLONE 4 MG PO TBPK: ORAL_TABLET | ORAL | 0 refills | Status: DC

## 2021-04-29 NOTE — ED Notes (Signed)
Pt confirms she has a ride home.

## 2021-04-29 NOTE — Discharge Instructions (Addendum)
SEEK IMMEDIATE MEDICAL ATTENTION IF: New numbness, tingling, weakness, or problem with the use of your arms or legs.  Severe back pain not relieved with medications.  Change in bowel or bladder control.  Increasing pain in any areas of the body (such as chest or abdominal pain).  Shortness of breath, dizziness or fainting.  Nausea (feeling sick to your stomach), vomiting, fever, or sweats.  

## 2021-04-29 NOTE — ED Provider Notes (Signed)
[facility] EMERGENCY DEPARTMENT Provider Note   CSN: [id number] Arrival date & time: 04/29/21  2140     History Chief Complaint  Patient presents with   Leg Pain    [place] is a 30 y.o. female.  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  She has chronic lower back pain with sciatica however she has had an increase in her pain recently which is severe.  She has been taking diclofenac and Zanaflex without relief of her symptoms.  She states that she is miserable and cannot find a comfortable position.  She has been through physical therapy and has been trying all of the physical therapy techniques without any relief of her symptoms.  Patient states that she can only stand up for couple of minutes before she has to lay back down due to severe pain.  She denies any saddle anesthesia, bowel or bladder incontinence, leg weakness.   Leg Pain     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    S/P C-section 07/28/2013   Sciatic nerve pain    Seasonal allergies     Patient Active Problem List   Diagnosis Date Noted   Right hip pain 04/05/2021   Vaginal irritation 01/09/2021   Urinary frequency 01/09/2021   Spinal stenosis of lumbar region with neurogenic claudication 12/19/2020   Disc displacement, lumbar 11/12/2020   Essential (primary) hypertension 11/12/2020   ASCUS of cervix with negative high risk HPV 09/21/2020   Encounter for gynecological examination with Papanicolaou smear of cervix 09/18/2020   Sciatica 08/30/2020   Mild asthma without complication [id number]   Tobacco abuse 07/03/2020   Migraines 01/05/2013    Past Surgical History:  Procedure Laterality Date   adenoids     CESAREAN SECTION  2007   CESAREAN SECTION N/A 07/28/2013    Procedure: CESAREAN SECTION;  Surgeon: Jonnie Kind, MD;  Location: [name];  Service: Obstetrics;  Laterality: N/A;   tubes in ears       OB History     Gravida  2   Para  2   Term  2   Preterm      AB      Living  2      SAB      IAB      Ectopic      Multiple      Live Births  2           Family History  Problem Relation Age of Onset   Cancer Mother    Breast cancer Mother    Hypertension Father    Stroke Father    CAD Maternal Grandfather    CAD Paternal Grandmother    CAD Paternal Grandfather    Diabetes Other        great-great grandmother   CAD Other     Social History   Tobacco Use   Smoking status: Some Days    Packs/day: 1.00    Years: 9.00    Pack years: 9.00    Types: Cigarettes   Smokeless tobacco: Never  Vaping Use   Vaping Use: Never used  Substance Use Topics   Alcohol use: Yes    Alcohol/week: 0.0 standard drinks   Drug use:  Never    Home Medications Prior to Admission medications   Medication Sig Start Date End Date Taking? Authorizing Provider  acetaminophen (TYLENOL) 500 MG tablet Take 1,500 mg every 6 (six) hours as needed by mouth for moderate pain or headache.    [provider]  albuterol (VENTOLIN HFA) 108 (90 Base) MCG/ACT inhaler Inhale 2 puffs into the lungs every 6 (six) hours as needed for wheezing or shortness of breath. 06/10/20   Rolland Porter, MD  Aspirin-Acetaminophen-Caffeine (EXCEDRIN MIGRAINE PO) Take by mouth.    [provider]  diclofenac (VOLTAREN) 75 MG EC tablet Take 1 tablet (75 mg total) by mouth 2 (two) times daily with a meal. 04/23/21   Sanjuana Kava, MD  hydrocortisone (ANUSOL-HC) 2.5 % rectal cream Place 1 application rectally 2 (two) times daily. 04/05/21   Noreene Larsson, NP  meclizine (ANTIVERT) 25 MG tablet Take 1 tablet (25 mg total) by mouth 3 (three) times daily as needed for dizziness. 01/11/21   Raspet, Derry Skill, PA-C  medroxyPROGESTERone Acetate 150 MG/ML SUSY INJECT 1  ML INTO THE MUSCLE EVERY 3 MONTHS 09/17/20   Derrek Monaco A, NP  ondansetron (ZOFRAN ODT) 4 MG disintegrating tablet Take 1 tablet (4 mg total) by mouth every 8 (eight) hours as needed for nausea or vomiting. 01/11/21   Raspet, Derry Skill, PA-C  Vitamin D, Ergocalciferol, (DRISDOL) 1.25 MG (50000 UNIT) CAPS capsule Take 1 capsule (50,000 Units total) by mouth every 7 (seven) days. 12/19/20   Lindell Spar, MD    Allergies    Latex  Review of Systems   Review of Systems Ten systems reviewed and are negative for acute change, except as noted in the HPI.   Physical Exam Updated Vital Signs BP 112/89 (BP Location: Right Arm)   Pulse 96   Temp 98.4 F (36.9 C) (Oral)   Resp 14   Ht '5\' 7"'$  (1.702 m)   Wt 86.8 kg   LMP  (LMP Unknown)   SpO2 99%   BMI 29.98 kg/m   Physical Exam Vitals and nursing note reviewed.  Constitutional:      General: She is not in acute distress.    Appearance: She is well-developed. She is not diaphoretic.  HENT:     Head: Normocephalic and atraumatic.     Right Ear: External ear normal.     Left Ear: External ear normal.     Nose: Nose normal.     Mouth/Throat:     Mouth: Mucous membranes are moist.  Eyes:     General: No scleral icterus.    Conjunctiva/sclera: Conjunctivae normal.  Cardiovascular:     Rate and Rhythm: Normal rate and regular rhythm.     Heart sounds: Normal heart sounds. No murmur heard.   No friction rub. No gallop.  Pulmonary:     Effort: Pulmonary effort is normal. No respiratory distress.     Breath sounds: Normal breath sounds.  Abdominal:     General: Bowel sounds are normal. There is no distension.     Palpations: Abdomen is soft. There is no mass.     Tenderness: There is no abdominal tenderness. There is no guarding.  Musculoskeletal:        General: No swelling.     Cervical back: Normal range of motion.     Comments: Positive straight leg test on the right, normal reflexes  Skin:    General: Skin is warm and dry.   Neurological:     Mental  Status: She is alert and oriented to person, place, and time.  Psychiatric:        Behavior: Behavior normal.    ED Results / Procedures / Treatments   Labs (all labs ordered are listed, but only abnormal results are displayed) Labs Reviewed - No data to display  EKG None  Radiology No results found.  Procedures Procedures   Medications Ordered in ED Medications - No data to display  ED Course  I have reviewed the triage vital signs and the nursing notes.  Pertinent labs & imaging results that were available during my care of the patient were reviewed by me and considered in my medical decision making (see chart for details).    MDM Rules/Calculators/A&P                           Patient with known spinal stenosis, acute on chronic severe pain.  I reviewed the PDMP and patient has no narcotic prescriptions since March of this year.  I ordered Toradol and Decadron IM here.  Patient will be discharged with a prednisone taper, Celebrex, and a very small course of narcotic pain medication to be used when pain is unbearable.  Patient is to follow-up closely with her primary surgeon.  She presented is appropriate for discharge at this time Final Clinical Impression(s) / ED Diagnoses Final diagnoses:  None    Rx / DC Orders ED Discharge Orders     None        Margarita Mail, PA-C 04/29/21 2310    Margette Fast, MD 04/30/21 [phone]

## 2021-04-29 NOTE — ED Notes (Signed)
Advised pt to not drive or operate machinery or make financial decisions while taking oxycodone. Pt verbalizes understanding.

## 2021-04-29 NOTE — ED Notes (Signed)
Pt awaiting family member to pick her up.

## 2021-04-29 NOTE — ED Triage Notes (Signed)
Pt. States they have a hemorrhaged disk in their back. Pt. States they are having right leg pain to where they can't sit or lay down.

## 2021-04-30 ENCOUNTER — Other Ambulatory Visit: Payer: Self-pay

## 2021-04-30 ENCOUNTER — Emergency Department (HOSPITAL_COMMUNITY)
Admission: EM | Admit: 2021-04-30 | Discharge: 2021-04-30 | Disposition: A | Discharge status: Home / Self Care | Payer: Medicaid Other | Attending: Emergency Medicine | Admitting: Emergency Medicine

## 2021-04-30 ENCOUNTER — Encounter (HOSPITAL_COMMUNITY): Payer: Self-pay

## 2021-04-30 ENCOUNTER — Emergency Department (HOSPITAL_COMMUNITY): Payer: Medicaid Other

## 2021-04-30 DIAGNOSIS — M549 Dorsalgia, unspecified: Secondary | ICD-10-CM | POA: Insufficient documentation

## 2021-04-30 DIAGNOSIS — M79604 Pain in right leg: Secondary | ICD-10-CM | POA: Insufficient documentation

## 2021-04-30 DIAGNOSIS — F17210 Nicotine dependence, cigarettes, uncomplicated: Secondary | ICD-10-CM | POA: Insufficient documentation

## 2021-04-30 DIAGNOSIS — Z91040 Latex allergy status: Secondary | ICD-10-CM | POA: Diagnosis not present

## 2021-04-30 DIAGNOSIS — J45909 Unspecified asthma, uncomplicated: Secondary | ICD-10-CM | POA: Diagnosis not present

## 2021-04-30 DIAGNOSIS — M5441 Lumbago with sciatica, right side: Secondary | ICD-10-CM | POA: Diagnosis not present

## 2021-04-30 DIAGNOSIS — I10 Essential (primary) hypertension: Secondary | ICD-10-CM | POA: Diagnosis not present

## 2021-04-30 DIAGNOSIS — M5431 Sciatica, right side: Secondary | ICD-10-CM | POA: Insufficient documentation

## 2021-04-30 DIAGNOSIS — Z7982 Long term (current) use of aspirin: Secondary | ICD-10-CM | POA: Insufficient documentation

## 2021-04-30 MED ORDER — KETOROLAC TROMETHAMINE 60 MG/2ML IM SOLN
60.0000 mg | Freq: Once | INTRAMUSCULAR | Status: AC
  Administered 2021-04-30: 60 mg via INTRAMUSCULAR
  Filled 2021-04-30: qty 2

## 2021-04-30 NOTE — ED Triage Notes (Signed)
Pt   ambulatory to er room number 19, pt states that she was here last night for R leg pain, states that she was dx with sciatica, states that she is here for some pain shooting down her leg, states that she now has some swelling in the back of her leg and some tingling in her foot.

## 2021-04-30 NOTE — ED Provider Notes (Signed)
[facility], [place] EMERGENCY DEPARTMENT Provider Note   CSN: [id number] Arrival date & time: 04/30/21  [id number]     History Chief Complaint  Patient presents with   Foot Pain    Kaylee Miller is a 30 y.o. female.  Pt reports she has chronic back pain.  Pt is followed by Dr. Luna Glasgow.  Pt was seen here yesterday for the same.  Pt has not started medications yet.  Pt came back today because she is concerned that she has a blood clot    The history is provided by the patient. No language interpreter was used.  Foot Pain This is a new problem. The current episode started 12 to 24 hours ago. The problem occurs constantly. The problem has been gradually worsening. Nothing aggravates the symptoms. Nothing relieves the symptoms. She has tried nothing for the symptoms. The treatment provided no relief.      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    S/P C-section 07/28/2013   Sciatic nerve pain    Seasonal allergies     Patient Active Problem List   Diagnosis Date Noted   Right hip pain 04/05/2021   Vaginal irritation 01/09/2021   Urinary frequency 01/09/2021   Spinal stenosis of lumbar region with neurogenic claudication 12/19/2020   Disc displacement, lumbar 11/12/2020   Essential (primary) hypertension 11/12/2020   ASCUS of cervix with negative high risk HPV 09/21/2020   Encounter for gynecological examination with Papanicolaou smear of cervix 09/18/2020   Sciatica 08/30/2020   Mild asthma without complication [id number]   Tobacco abuse 07/03/2020   Migraines 01/05/2013    Past Surgical History:  Procedure Laterality Date   adenoids     CESAREAN SECTION  2007   CESAREAN SECTION N/A 07/28/2013   Procedure: CESAREAN SECTION;  Surgeon: Jonnie Kind, MD;  Location: [facility];  Service: Obstetrics;   Laterality: N/A;   tubes in ears       OB History     Gravida  2   Para  2   Term  2   Preterm      AB      Living  2      SAB      IAB      Ectopic      Multiple      Live Births  2           Family History  Problem Relation Age of Onset   Cancer Mother    Breast cancer Mother    Hypertension Father    Stroke Father    CAD Maternal Grandfather    CAD Paternal Grandmother    CAD Paternal Grandfather    Diabetes Other        great-great grandmother   CAD Other     Social History   Tobacco Use   Smoking status: Some Days    Packs/day: 1.00    Years: 9.00    Pack years: 9.00    Types: Cigarettes   Smokeless tobacco: Never  Vaping Use   Vaping Use: Never used  Substance Use Topics   Alcohol use: Yes    Alcohol/week: 0.0 standard drinks   Drug use: Never    Home Medications Prior to Admission medications   Medication Sig Start  Date End Date Taking? Authorizing Provider  acetaminophen (TYLENOL) 500 MG tablet Take 1,500 mg every 6 (six) hours as needed by mouth for moderate pain or headache.    [provider]  albuterol (VENTOLIN HFA) 108 (90 Base) MCG/ACT inhaler Inhale 2 puffs into the lungs every 6 (six) hours as needed for wheezing or shortness of breath. 06/10/20   Rolland Porter, MD  Aspirin-Acetaminophen-Caffeine (EXCEDRIN MIGRAINE PO) Take by mouth.    [provider]  celecoxib (CELEBREX) 200 MG capsule Take 1 capsule (200 mg total) by mouth 2 (two) times daily. 04/29/21   Margarita Mail, PA-C  hydrocortisone (ANUSOL-HC) 2.5 % rectal cream Place 1 application rectally 2 (two) times daily. 04/05/21   Noreene Larsson, NP  meclizine (ANTIVERT) 25 MG tablet Take 1 tablet (25 mg total) by mouth 3 (three) times daily as needed for dizziness. 01/11/21   Raspet, Derry Skill, PA-C  medroxyPROGESTERone Acetate 150 MG/ML SUSY INJECT 1 ML INTO THE MUSCLE EVERY 3 MONTHS 09/17/20   Estill Dooms, NP  methylPREDNISolone (MEDROL DOSEPAK) 4 MG  TBPK tablet Use as directed 04/29/21   Margarita Mail, PA-C  ondansetron (ZOFRAN ODT) 4 MG disintegrating tablet Take 1 tablet (4 mg total) by mouth every 8 (eight) hours as needed for nausea or vomiting. 01/11/21   Raspet, Junie Panning K, PA-C  oxyCODONE (ROXICODONE) 5 MG immediate release tablet Take 0.5-1 tablets (2.5-5 mg total) by mouth every 6 (six) hours as needed for severe pain. 04/29/21   Margarita Mail, PA-C  Vitamin D, Ergocalciferol, (DRISDOL) 1.25 MG (50000 UNIT) CAPS capsule Take 1 capsule (50,000 Units total) by mouth every 7 (seven) days. 12/19/20   Lindell Spar, MD    Allergies    Latex  Review of Systems   Review of Systems  All other systems reviewed and are negative.  Physical Exam Updated Vital Signs BP 131/72   Pulse 90   Temp 99.2 F (37.3 C) (Oral)   Resp 18   Ht '5\' 7"'$  (1.702 m)   Wt 86.7 kg   LMP  (LMP Unknown)   SpO2 97%   BMI 29.95 kg/m   Physical Exam Vitals reviewed.  HENT:     Head: Normocephalic.  Cardiovascular:     Rate and Rhythm: Normal rate.  Pulmonary:     Effort: Pulmonary effort is normal.  Musculoskeletal:        General: Tenderness present. Normal range of motion.     Comments: Tender right lower leg, good pulse   Skin:    General: Skin is warm.  Neurological:     General: No focal deficit present.     Mental Status: She is alert.  Psychiatric:        Mood and Affect: Mood normal.    ED Results / Procedures / Treatments   Labs (all labs ordered are listed, but only abnormal results are displayed) Labs Reviewed - No data to display  EKG None  Radiology US Venous Img Lower Unilateral Right  Result Date: 04/30/2021 CLINICAL DATA:  Right lower extremity pain and edema. History of smoking. Evaluate for DVT. EXAM: RIGHT LOWER EXTREMITY VENOUS DOPPLER ULTRASOUND TECHNIQUE: Gray-scale sonography with graded compression, as well as color Doppler and duplex ultrasound were performed to evaluate the lower extremity deep venous systems  from the level of the common femoral vein and including the common femoral, femoral, profunda femoral, popliteal and calf veins including the posterior tibial, peroneal and gastrocnemius veins when visible. The superficial great saphenous vein  was also interrogated. Spectral Doppler was utilized to evaluate flow at rest and with distal augmentation maneuvers in the common femoral, femoral and popliteal veins. COMPARISON:  None. FINDINGS: Contralateral Common Femoral Vein: Respiratory phasicity is normal and symmetric with the symptomatic side. No evidence of thrombus. Normal compressibility. Common Femoral Vein: No evidence of thrombus. Normal compressibility, respiratory phasicity and response to augmentation. Saphenofemoral Junction: No evidence of thrombus. Normal compressibility and flow on color Doppler imaging. Profunda Femoral Vein: No evidence of thrombus. Normal compressibility and flow on color Doppler imaging. Femoral Vein: No evidence of thrombus. Normal compressibility, respiratory phasicity and response to augmentation. Popliteal Vein: No evidence of thrombus. Normal compressibility, respiratory phasicity and response to augmentation. Calf Veins: No evidence of thrombus. Normal compressibility and flow on color Doppler imaging. Superficial Great Saphenous Vein: No evidence of thrombus. Normal compressibility. Venous Reflux:  None. Other Findings:  None. IMPRESSION: No evidence of DVT within the right lower extremity. Electronically Signed   By: Sandi Mariscal M.D.   On: 04/30/2021 10:13    Procedures Procedures   Medications Ordered in ED Medications  ketorolac (TORADOL) injection 60 mg (60 mg Intramuscular Given 04/30/21 0943)    ED Course  I have reviewed the triage vital signs and the nursing notes.  Pertinent labs & imaging results that were available during my care of the patient were reviewed by me and considered in my medical decision making (see chart for details).    MDM  Rules/Calculators/A&P                           MDM:  Ultrasound no dvt.  Pt advised to fill rx.  Call physicain for followup  Final Clinical Impression(s) / ED Diagnoses Final diagnoses:  Sciatica, right side    Rx / DC Orders ED Discharge Orders     None     An After Visit Summary was printed and given to the patient.    Fransico Meadow, Hershal Coria 04/30/21 1037    Davonna Belling, MD 05/01/21 1324

## 2021-05-01 ENCOUNTER — Telehealth: Payer: Self-pay

## 2021-05-01 NOTE — Telephone Encounter (Signed)
Transition Care Management Follow-up Telephone Call Date of discharge and from where: 04/30/2021 from [facility] How have you been since you were released from the hospital? Pt stated that she is still hurting and is following up with Neurosurgery.  Any questions or concerns? No  Items Reviewed: Did the pt receive and understand the discharge instructions provided? Yes  Medications obtained and verified? Yes  Other? No  Any new allergies since your discharge? No  Dietary orders reviewed? No Do you have support at home? Yes   Functional Questionnaire: (I = Independent and D = Dependent) ADLs: I  Bathing/Dressing- I  Meal Prep- I  Eating- I  Maintaining continence- I  Transferring/Ambulation- I  Managing Meds- I   Follow up appointments reviewed:  PCP Hospital f/u appt confirmed? No   Specialist Hospital f/u appt confirmed? Yes  Following up with Spine Surgery Are transportation arrangements needed? No  If their condition worsens, is the pt aware to call PCP or go to the Emergency Dept.? Yes Was the patient provided with contact information for the PCP's office or ED? Yes Was to pt encouraged to call back with questions or concerns? Yes

## 2021-05-02 ENCOUNTER — Other Ambulatory Visit: Payer: Self-pay

## 2021-05-02 ENCOUNTER — Encounter: Payer: Self-pay | Admitting: Orthopaedic Surgery

## 2021-05-02 ENCOUNTER — Ambulatory Visit: Payer: Medicaid Other | Admitting: Orthopaedic Surgery

## 2021-05-02 VITALS — BP 123/94 | HR 82 | Ht 67.0 in | Wt 192.0 lb

## 2021-05-02 DIAGNOSIS — G8929 Other chronic pain: Secondary | ICD-10-CM | POA: Diagnosis not present

## 2021-05-02 DIAGNOSIS — F17210 Nicotine dependence, cigarettes, uncomplicated: Secondary | ICD-10-CM | POA: Diagnosis not present

## 2021-05-02 DIAGNOSIS — M5441 Lumbago with sciatica, right side: Secondary | ICD-10-CM

## 2021-05-02 NOTE — Progress Notes (Signed)
My back is much worse.  She has had increasing pain and numbness of the lower back and right foot and leg.  She has been to the ER twice for this.  She has had epidural of the lower back in late May which only helped slightly.  The neurosurgeon recommended surgery then but she said she could not have it then.  She works at the [employer] in [place] and is trying to work.  She is having a hard time.  Nothing seems to help.  She has pain in the lower back and can hardly sit in chair without moving in pain and discomfort.  She has positive SLR at 20 degrees on the right. She has no spasm but is tight in the right lower back.  Achilles reflex is diminished.  She limps to the right.  I have reviewed the ER records.  Encounter Diagnoses  Name Primary?   Chronic midline low back pain with right-sided sciatica Yes   Nicotine dependence, cigarettes, uncomplicated    She needs to see neurosurgeon to be re-evaluated and possible surgery.  She wants a note for work.  I have given a note for being out of work but she wants to try light duty.  I have filled out form for this.  I do not know how she can work the way she is.  Return in two weeks.  She has pain medicine from the ER. Take that.  Continue her Naprosyn.  Call if any problem.  Precautions discussed.  Electronically Signed Sanjuana Kava, MD 9/8/20229:27 AM

## 2021-05-02 NOTE — Patient Instructions (Signed)
Note for out of work.

## 2021-05-06 ENCOUNTER — Telehealth: Payer: Self-pay | Admitting: Orthopaedic Surgery

## 2021-05-06 MED ORDER — HYDROCODONE-ACETAMINOPHEN 5-325 MG PO TABS: ORAL_TABLET | ORAL | 0 refills | Status: DC

## 2021-05-06 NOTE — Telephone Encounter (Signed)
Patient requests refill:  oxyCODONE (ROXICODONE) 5 MG immediate release tablet 10 tablet  [place]

## 2021-05-10 ENCOUNTER — Telehealth: Payer: Self-pay | Admitting: Orthopaedic Surgery

## 2021-05-10 NOTE — Telephone Encounter (Signed)
Patient called back to request refill on medication: tiZANidine (ZANAFLEX) 4 MG tablet (Discontinued) 30 tablet              [place]

## 2021-05-14 ENCOUNTER — Other Ambulatory Visit (HOSPITAL_COMMUNITY): Payer: Self-pay | Admitting: Neurosurgery

## 2021-05-14 ENCOUNTER — Other Ambulatory Visit: Payer: Self-pay | Admitting: Neurosurgery

## 2021-05-14 ENCOUNTER — Ambulatory Visit: Payer: Medicaid Other | Admitting: Orthopaedic Surgery

## 2021-05-14 DIAGNOSIS — M5126 Other intervertebral disc displacement, lumbar region: Secondary | ICD-10-CM

## 2021-05-14 NOTE — Telephone Encounter (Signed)
Spoke with patient; aware, as noted per Dr Luna Glasgow. Voiced understanding/

## 2021-05-16 ENCOUNTER — Ambulatory Visit: Payer: Medicaid Other | Admitting: Orthopaedic Surgery

## 2021-05-17 ENCOUNTER — Encounter (HOSPITAL_COMMUNITY): Payer: Self-pay | Admitting: Emergency Medicine

## 2021-05-17 ENCOUNTER — Other Ambulatory Visit: Payer: Self-pay

## 2021-05-17 ENCOUNTER — Other Ambulatory Visit: Payer: Self-pay | Admitting: Internal Medicine

## 2021-05-17 ENCOUNTER — Emergency Department (HOSPITAL_COMMUNITY)
Admission: EM | Admit: 2021-05-17 | Discharge: 2021-05-17 | Disposition: A | Discharge status: Home / Self Care | Payer: Medicaid Other | Attending: Emergency Medicine | Admitting: Emergency Medicine

## 2021-05-17 ENCOUNTER — Telehealth: Payer: Self-pay

## 2021-05-17 DIAGNOSIS — M79604 Pain in right leg: Secondary | ICD-10-CM | POA: Insufficient documentation

## 2021-05-17 DIAGNOSIS — Z5321 Procedure and treatment not carried out due to patient leaving prior to being seen by health care provider: Secondary | ICD-10-CM | POA: Insufficient documentation

## 2021-05-17 DIAGNOSIS — M48062 Spinal stenosis, lumbar region with neurogenic claudication: Secondary | ICD-10-CM

## 2021-05-17 MED ORDER — TIZANIDINE HCL 4 MG PO TABS: 4.0000 mg | ORAL_TABLET | Freq: Four times a day (QID) | ORAL | 1 refills | Status: DC | PRN

## 2021-05-17 NOTE — Telephone Encounter (Signed)
Patient called need med refill  tiZANidine (ZANAFLEX) 4 MG tablet [[id number]]   [id number]

## 2021-05-17 NOTE — ED Triage Notes (Signed)
Pt states she has been having right leg pain for the past month. Pt states she has followed up with several MDs. Pt states they put her on hydrocodone but states it isn't helping.

## 2021-05-19 ENCOUNTER — Emergency Department (HOSPITAL_COMMUNITY)
Admission: EM | Admit: 2021-05-19 | Discharge: 2021-05-20 | Disposition: A | Discharge status: Home / Self Care | Payer: Medicaid Other | Attending: Emergency Medicine | Admitting: Emergency Medicine

## 2021-05-19 ENCOUNTER — Other Ambulatory Visit: Payer: Self-pay

## 2021-05-19 ENCOUNTER — Encounter (HOSPITAL_COMMUNITY): Payer: Self-pay | Admitting: *Deleted

## 2021-05-19 DIAGNOSIS — Z91040 Latex allergy status: Secondary | ICD-10-CM | POA: Insufficient documentation

## 2021-05-19 DIAGNOSIS — I959 Hypotension, unspecified: Secondary | ICD-10-CM | POA: Diagnosis not present

## 2021-05-19 DIAGNOSIS — Z79899 Other long term (current) drug therapy: Secondary | ICD-10-CM | POA: Diagnosis not present

## 2021-05-19 DIAGNOSIS — I10 Essential (primary) hypertension: Secondary | ICD-10-CM | POA: Insufficient documentation

## 2021-05-19 DIAGNOSIS — M5431 Sciatica, right side: Secondary | ICD-10-CM

## 2021-05-19 DIAGNOSIS — M549 Dorsalgia, unspecified: Secondary | ICD-10-CM | POA: Diagnosis not present

## 2021-05-19 DIAGNOSIS — M545 Low back pain, unspecified: Secondary | ICD-10-CM | POA: Diagnosis not present

## 2021-05-19 DIAGNOSIS — M5441 Lumbago with sciatica, right side: Secondary | ICD-10-CM | POA: Diagnosis not present

## 2021-05-19 DIAGNOSIS — M79604 Pain in right leg: Secondary | ICD-10-CM | POA: Insufficient documentation

## 2021-05-19 DIAGNOSIS — F17210 Nicotine dependence, cigarettes, uncomplicated: Secondary | ICD-10-CM | POA: Insufficient documentation

## 2021-05-19 MED ORDER — DIAZEPAM 2 MG PO TABS
2.0000 mg | ORAL_TABLET | Freq: Once | ORAL | Status: AC
  Administered 2021-05-20: 2 mg via ORAL
  Filled 2021-05-19: qty 1

## 2021-05-19 MED ORDER — DEXAMETHASONE 4 MG PO TABS
10.0000 mg | ORAL_TABLET | Freq: Once | ORAL | Status: AC
  Administered 2021-05-20: 10 mg via ORAL
  Filled 2021-05-19: qty 3

## 2021-05-19 MED ORDER — KETOROLAC TROMETHAMINE 60 MG/2ML IM SOLN
60.0000 mg | Freq: Once | INTRAMUSCULAR | Status: AC
  Administered 2021-05-20: 60 mg via INTRAMUSCULAR
  Filled 2021-05-19: qty 2

## 2021-05-19 MED ORDER — OXYCODONE-ACETAMINOPHEN 5-325 MG PO TABS
1.0000 | ORAL_TABLET | Freq: Once | ORAL | Status: AC
Start: 2021-05-20 — End: 2021-05-20
  Administered 2021-05-20: 1 via ORAL
  Filled 2021-05-19: qty 1

## 2021-05-19 NOTE — ED Triage Notes (Signed)
Pt reporting right leg pain (hip and thigh pain) for several weeks. Today, feels like her leg "is locking up". Feels like her foot falls asleep. Pt says she is out of her muscle relaxer and has been taking hydrocodone.

## 2021-05-19 NOTE — ED Notes (Signed)
Signature pad not working in room, pt verbalizes understanding of MSE

## 2021-05-20 ENCOUNTER — Emergency Department (HOSPITAL_COMMUNITY): Payer: Medicaid Other

## 2021-05-20 DIAGNOSIS — M545 Low back pain, unspecified: Secondary | ICD-10-CM | POA: Diagnosis not present

## 2021-05-20 LAB — CBC WITH DIFFERENTIAL/PLATELET
Abs Immature Granulocytes: 0.01 10*3/uL (ref 0.00–0.07)
Basophils Absolute: 0.1 10*3/uL (ref 0.0–0.1)
Basophils Relative: 1 %
Eosinophils Absolute: 0.9 10*3/uL — ABNORMAL HIGH (ref 0.0–0.5)
Eosinophils Relative: 11 %
HCT: 40 % (ref 36.0–46.0)
Hemoglobin: 13.3 g/dL (ref 12.0–15.0)
Immature Granulocytes: 0 %
Lymphocytes Relative: 32 %
Lymphs Abs: 2.7 10*3/uL (ref 0.7–4.0)
MCH: 29 pg (ref 26.0–34.0)
MCHC: 33.3 g/dL (ref 30.0–36.0)
MCV: 87.1 fL (ref 80.0–100.0)
Monocytes Absolute: 0.6 10*3/uL (ref 0.1–1.0)
Monocytes Relative: 7 %
Neutro Abs: 4.2 10*3/uL (ref 1.7–7.7)
Neutrophils Relative %: 49 %
Platelets: 185 10*3/uL (ref 150–400)
RBC: 4.59 MIL/uL (ref 3.87–5.11)
RDW: 13.3 % (ref 11.5–15.5)
WBC: 8.5 10*3/uL (ref 4.0–10.5)
nRBC: 0 % (ref 0.0–0.2)

## 2021-05-20 LAB — BASIC METABOLIC PANEL
Anion gap: 5 (ref 5–15)
BUN: 14 mg/dL (ref 6–20)
CO2: 21 mmol/L — ABNORMAL LOW (ref 22–32)
Calcium: 8.5 mg/dL — ABNORMAL LOW (ref 8.9–10.3)
Chloride: 109 mmol/L (ref 98–111)
Creatinine, Ser: 0.57 mg/dL (ref 0.44–1.00)
GFR, Estimated: >60 mL/min (ref >60)
Glucose, Bld: 94 mg/dL (ref 70–99)
Potassium: 3.7 mmol/L (ref 3.5–5.1)
Sodium: 135 mmol/L (ref 135–145)

## 2021-05-20 LAB — MAGNESIUM: Magnesium: 2 mg/dL (ref 1.7–2.4)

## 2021-05-20 MED ORDER — PREDNISONE 20 MG PO TABS: ORAL_TABLET | ORAL | 0 refills | Status: DC

## 2021-05-20 MED ORDER — HYDROMORPHONE HCL 1 MG/ML IJ SOLN
1.0000 mg | INTRAMUSCULAR | Status: AC | PRN
  Administered 2021-05-20 (×3): 1 mg via INTRAVENOUS
  Filled 2021-05-20 (×3): qty 1

## 2021-05-20 MED ORDER — HYDROMORPHONE HCL 1 MG/ML IJ SOLN
1.0000 mg | Freq: Once | INTRAMUSCULAR | Status: AC
  Administered 2021-05-20: 1 mg via INTRAVENOUS
  Filled 2021-05-20: qty 1

## 2021-05-20 MED ORDER — ONDANSETRON HCL 4 MG/2ML IJ SOLN
4.0000 mg | Freq: Once | INTRAMUSCULAR | Status: AC
  Administered 2021-05-20: 4 mg via INTRAVENOUS
  Filled 2021-05-20: qty 2

## 2021-05-20 MED ORDER — HYDROCODONE-ACETAMINOPHEN 5-325 MG PO TABS: 1.0000 | ORAL_TABLET | ORAL | 0 refills | Status: DC | PRN

## 2021-05-20 MED ORDER — HYDROCODONE-ACETAMINOPHEN 5-325 MG PO TABS
2.0000 | ORAL_TABLET | Freq: Once | ORAL | Status: AC
Start: 2021-05-20 — End: 2021-05-20
  Administered 2021-05-20: 2 via ORAL
  Filled 2021-05-20: qty 2

## 2021-05-20 NOTE — ED Provider Notes (Signed)
[facility] EMERGENCY DEPARTMENT Provider Note   CSN: [id number] Arrival date & time: 05/19/21  2310     History Chief Complaint  Patient presents with   Leg Pain    Kaylee Miller is a 30 y.o. female.   Leg Pain Location:  Leg Time since incident:  4 weeks Injury: no   Leg location:  R leg Pain details:    Quality:  Shooting and sharp   Radiates to:  Does not radiate   Severity:  Mild   Onset quality:  Gradual   Timing:  Constant   Progression:  Worsening Chronicity:  Chronic Dislocation: no   Prior injury to area:  No Relieved by:  NSAIDs and muscle relaxant Worsened by:  Activity Associated symptoms: no back pain and no fever       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    S/P C-section 07/28/2013   Sciatic nerve pain    Seasonal allergies     Patient Active Problem List   Diagnosis Date Noted   Right hip pain 04/05/2021   Vaginal irritation 01/09/2021   Urinary frequency 01/09/2021   Spinal stenosis of lumbar region with neurogenic claudication 12/19/2020   Disc displacement, lumbar 11/12/2020   Essential (primary) hypertension 11/12/2020   ASCUS of cervix with negative high risk HPV 09/21/2020   Encounter for gynecological examination with Papanicolaou smear of cervix 09/18/2020   Sciatica 08/30/2020   Mild asthma without complication 64/33/2951   Tobacco abuse 07/03/2020   Migraines 01/05/2013    Past Surgical History:  Procedure Laterality Date   adenoids     CESAREAN SECTION  2007   CESAREAN SECTION N/A 07/28/2013   Procedure: CESAREAN SECTION;  Surgeon: Jonnie Kind, MD;  Location: [facility];  Service: Obstetrics;  Laterality: N/A;   tubes in ears       OB History     Gravida  2   Para  2   Term  2   Preterm      AB      Living  2       SAB      IAB      Ectopic      Multiple      Live Births  2           Family History  Problem Relation Age of Onset   Cancer Mother    Breast cancer Mother    Hypertension Father    Stroke Father    CAD Maternal Grandfather    CAD Paternal Grandmother    CAD Paternal Grandfather    Diabetes Other        great-great grandmother   CAD Other     Social History   Tobacco Use   Smoking status: Some Days    Packs/day: 1.00    Years: 9.00    Pack years: 9.00    Types: Cigarettes   Smokeless tobacco: Never  Vaping Use   Vaping Use: Never used  Substance Use Topics   Alcohol use: Yes    Alcohol/week: 0.0 standard drinks   Drug use: Never    Home Medications Prior to Admission medications   Medication Sig Start Date End Date Taking? Authorizing Provider  acetaminophen (TYLENOL) 500 MG tablet Take 1,500  mg every 6 (six) hours as needed by mouth for moderate pain or headache.   Yes [provider]  albuterol (VENTOLIN HFA) 108 (90 Base) MCG/ACT inhaler Inhale 2 puffs into the lungs every 6 (six) hours as needed for wheezing or shortness of breath. 06/10/20  Yes Rolland Porter, MD  Aspirin-Acetaminophen-Caffeine (EXCEDRIN MIGRAINE PO) Take by mouth.   Yes [provider]  celecoxib (CELEBREX) 200 MG capsule Take 1 capsule (200 mg total) by mouth 2 (two) times daily. 04/29/21  Yes Margarita Mail, PA-C  HYDROcodone-acetaminophen (NORCO/VICODIN) 5-325 MG tablet One tablet every six hours for pain.  Limit 7 days. 05/06/21  Yes Sanjuana Kava, MD  meclizine (ANTIVERT) 25 MG tablet Take 1 tablet (25 mg total) by mouth 3 (three) times daily as needed for dizziness. 01/11/21  Yes Raspet, Erin K, PA-C  medroxyPROGESTERone Acetate 150 MG/ML SUSY INJECT 1 ML INTO THE MUSCLE EVERY 3 MONTHS 09/17/20  Yes Derrek Monaco A, NP  tiZANidine (ZANAFLEX) 4 MG tablet Take 1 tablet (4 mg total) by mouth every 6 (six) hours as needed for muscle spasms. 05/17/21  Yes Lindell Spar,  MD  Vitamin D, Ergocalciferol, (DRISDOL) 1.25 MG (50000 UNIT) CAPS capsule Take 1 capsule (50,000 Units total) by mouth every 7 (seven) days. 12/19/20  Yes Lindell Spar, MD  hydrocortisone (ANUSOL-HC) 2.5 % rectal cream Place 1 application rectally 2 (two) times daily. 04/05/21   Noreene Larsson, NP  methylPREDNISolone (MEDROL DOSEPAK) 4 MG TBPK tablet Use as directed 04/29/21   Margarita Mail, PA-C  ondansetron (ZOFRAN ODT) 4 MG disintegrating tablet Take 1 tablet (4 mg total) by mouth every 8 (eight) hours as needed for nausea or vomiting. 01/11/21   Raspet, Junie Panning K, PA-C  oxyCODONE (ROXICODONE) 5 MG immediate release tablet Take 0.5-1 tablets (2.5-5 mg total) by mouth every 6 (six) hours as needed for severe pain. 04/29/21   Margarita Mail, PA-C    Allergies    Latex  Review of Systems   Review of Systems  Constitutional:  Negative for fever.  Musculoskeletal:  Negative for back pain.  All other systems reviewed and are negative.  Physical Exam Updated Vital Signs BP 123/75   Pulse 69   Temp 98.8 F (37.1 C) (Oral)   Resp 18   Ht 5\' 7"  (1.702 m)   Wt 87 kg   LMP  (LMP Unknown)   SpO2 98%   BMI 30.04 kg/m   Physical Exam Vitals and nursing note reviewed.  Constitutional:      Appearance: She is well-developed.  HENT:     Head: Normocephalic and atraumatic.     Nose: No congestion or rhinorrhea.  Eyes:     Pupils: Pupils are equal, round, and reactive to light.  Cardiovascular:     Rate and Rhythm: Normal rate and regular rhythm.     Comments: Intact DP pulse Pulmonary:     Effort: No respiratory distress.     Breath sounds: No stridor.  Abdominal:     General: There is no distension.  Musculoskeletal:        General: No swelling or tenderness. Normal range of motion.     Cervical back: Normal range of motion.  Skin:    General: Skin is warm and dry.  Neurological:     General: No focal deficit present.     Mental Status: She is alert.    ED Results / Procedures  / Treatments   Labs (all labs ordered are listed, but  only abnormal results are displayed) Labs Reviewed  CBC WITH DIFFERENTIAL/PLATELET - Abnormal; Notable for the following components:      Result Value   Eosinophils Absolute 0.9 (*)    All other components within normal limits  BASIC METABOLIC PANEL - Abnormal; Notable for the following components:   CO2 21 (*)    Calcium 8.5 (*)    All other components within normal limits  MAGNESIUM    EKG None  Radiology No results found.  Procedures Procedures   Medications Ordered in ED Medications  ketorolac (TORADOL) injection 60 mg (60 mg Intramuscular Given 05/20/21 0028)  dexamethasone (DECADRON) tablet 10 mg (10 mg Oral Given 05/20/21 0026)  oxyCODONE-acetaminophen (PERCOCET/ROXICET) 5-325 MG per tablet 1 tablet (1 tablet Oral Given 05/20/21 0027)  diazepam (VALIUM) tablet 2 mg (2 mg Oral Given 05/20/21 9518)    ED Course  I have reviewed the triage vital signs and the nursing notes.  Pertinent labs & imaging results that were available during my care of the patient were reviewed by me and considered in my medical decision making (see chart for details).    MDM Rules/Calculators/A&P                         Likely her neurogenic claudication which she has been seeign Dr. Ronney Asters and Dr. Cyndy Freeze and has appointments with NSG for same. Will ensure no electrolyte abnormalities, otherwise symptom control.  Patient required multiple doses of pain medication just to keep pain tolerable.  Suspect she has worsening stenosis.  Will obtain MRI and discussed with neurosurgery.  MRI pending at time of care transfer.   Final Clinical Impression(s) / ED Diagnoses Final diagnoses:  None    Rx / DC Orders ED Discharge Orders     None        Quinlynn Cuthbert, Corene Cornea, MD 05/20/21 2351

## 2021-05-20 NOTE — ED Notes (Signed)
Patient transported to MRI 

## 2021-05-20 NOTE — ED Provider Notes (Signed)
8:59 AM Care transferred to me. MRI shows slight worsening in her primarily right-sided disc bulge.  Overall the patient symptoms have been worsening despite multiple medications including hydrocodone, Zanaflex, Celebrex.  Pain is okay right now but she has been requiring multiple doses of pain meds throughout the night.  Will discuss with neurosurgery.  I discussed with Dr. Christella Noa, who recommends steroids and close follow-up with him.  However there is no indication for admission or surgery for him.  Patient is feeling somewhat better and has only 2 Norco left so we will give a few more as well as restart the prednisone.  I did discuss her current medications and she is on diclofenac and we discussed not using other NSAIDs along with this.  Given return precautions.   Sherwood Gambler, MD 05/20/21 1018

## 2021-05-20 NOTE — Discharge Instructions (Addendum)
If you develop worsening, recurrent, or continued back pain, numbness or weakness in the legs, incontinence of your bowels or bladders, numbness of your buttocks, fever, abdominal pain, or any other new/concerning symptoms then return to the ER for evaluation.  

## 2021-05-20 NOTE — ED Notes (Signed)
Pt returned from MRI °

## 2021-05-21 ENCOUNTER — Telehealth: Payer: Self-pay | Admitting: Radiology

## 2021-05-21 ENCOUNTER — Ambulatory Visit: Payer: Medicaid Other | Admitting: Orthopaedic Surgery

## 2021-05-21 ENCOUNTER — Other Ambulatory Visit: Payer: Self-pay | Admitting: Neurosurgery

## 2021-05-21 ENCOUNTER — Telehealth: Payer: Self-pay

## 2021-05-21 DIAGNOSIS — R030 Elevated blood-pressure reading, without diagnosis of hypertension: Secondary | ICD-10-CM | POA: Diagnosis not present

## 2021-05-21 DIAGNOSIS — M5126 Other intervertebral disc displacement, lumbar region: Secondary | ICD-10-CM | POA: Diagnosis not present

## 2021-05-21 DIAGNOSIS — Z6828 Body mass index (BMI) 28.0-28.9, adult: Secondary | ICD-10-CM | POA: Diagnosis not present

## 2021-05-21 NOTE — Telephone Encounter (Signed)
Transition Care Management Follow-up Telephone Call Date of discharge and from where: 05/20/2021-[place]  How have you been since you were released from the hospital? Patient stated her leg is still hurting  Any questions or concerns? No  Items Reviewed: Did the pt receive and understand the discharge instructions provided? Yes  Medications obtained and verified? Yes  Other? No  Any new allergies since your discharge? No  Dietary orders reviewed? N/A Do you have support at home? Yes   Home Care and Equipment/Supplies: Were home health services ordered? not applicable If so, what is the name of the agency? N/A  Has the agency set up a time to come to the patient's home? not applicable Were any new equipment or medical supplies ordered?  No What is the name of the medical supply agency? N/A Were you able to get the supplies/equipment? not applicable Do you have any questions related to the use of the equipment or supplies? No  Functional Questionnaire: (I = Independent and D = Dependent) ADLs: I  Bathing/Dressing- I  Meal Prep- I  Eating- I  Maintaining continence- I  Transferring/Ambulation- I  Managing Meds- I  Follow up appointments reviewed:  PCP Hospital f/u appt confirmed? No   Specialist Hospital f/u appt confirmed? Yes  patient stated she has an appointment today with her Neurologist. Are transportation arrangements needed? No  If their condition worsens, is the pt aware to call PCP or go to the Emergency Dept.? Yes Was the patient provided with contact information for the PCP's office or ED? Yes Was to pt encouraged to call back with questions or concerns? Yes

## 2021-05-21 NOTE — Telephone Encounter (Signed)
Patient called and said that she was in the ED and had her MRI.  ED provider spoke with Dr Christella Noa and she is seeing him this afternoon.  I cancelled her appt today with you.  Patient wants to know if you want her to schedule a f/u appt with you?  Please advise and I can call her and let her know.

## 2021-05-22 NOTE — Telephone Encounter (Signed)
I called patient and advised. 

## 2021-05-24 ENCOUNTER — Observation Stay (HOSPITAL_COMMUNITY)
Admission: EM | Admit: 2021-05-24 | Discharge: 2021-05-25 | Disposition: A | Discharge status: Home / Self Care | Payer: Medicaid Other | Attending: Emergency Medicine | Admitting: Emergency Medicine

## 2021-05-24 DIAGNOSIS — J45909 Unspecified asthma, uncomplicated: Secondary | ICD-10-CM | POA: Insufficient documentation

## 2021-05-24 DIAGNOSIS — F17210 Nicotine dependence, cigarettes, uncomplicated: Secondary | ICD-10-CM | POA: Insufficient documentation

## 2021-05-24 DIAGNOSIS — Z419 Encounter for procedure for purposes other than remedying health state, unspecified: Secondary | ICD-10-CM

## 2021-05-24 DIAGNOSIS — M5126 Other intervertebral disc displacement, lumbar region: Principal | ICD-10-CM | POA: Insufficient documentation

## 2021-05-24 DIAGNOSIS — Z91040 Latex allergy status: Secondary | ICD-10-CM | POA: Diagnosis not present

## 2021-05-24 DIAGNOSIS — Z20822 Contact with and (suspected) exposure to covid-19: Secondary | ICD-10-CM | POA: Insufficient documentation

## 2021-05-24 DIAGNOSIS — M5127 Other intervertebral disc displacement, lumbosacral region: Secondary | ICD-10-CM | POA: Diagnosis present

## 2021-05-24 LAB — CBC
HCT: 40.6 % (ref 36.0–46.0)
Hemoglobin: 13.1 g/dL (ref 12.0–15.0)
MCH: 27.8 pg (ref 26.0–34.0)
MCHC: 32.3 g/dL (ref 30.0–36.0)
MCV: 86.2 fL (ref 80.0–100.0)
Platelets: 196 10*3/uL (ref 150–400)
RBC: 4.71 MIL/uL (ref 3.87–5.11)
RDW: 13.1 % (ref 11.5–15.5)
WBC: 11.2 10*3/uL — ABNORMAL HIGH (ref 4.0–10.5)
nRBC: 0 % (ref 0.0–0.2)

## 2021-05-24 LAB — BASIC METABOLIC PANEL
Anion gap: 8 (ref 5–15)
BUN: 15 mg/dL (ref 6–20)
CO2: 25 mmol/L (ref 22–32)
Calcium: 9 mg/dL (ref 8.9–10.3)
Chloride: 105 mmol/L (ref 98–111)
Creatinine, Ser: 0.81 mg/dL (ref 0.44–1.00)
GFR, Estimated: >60 mL/min (ref >60)
Glucose, Bld: 94 mg/dL (ref 70–99)
Potassium: 3.3 mmol/L — ABNORMAL LOW (ref 3.5–5.1)
Sodium: 138 mmol/L (ref 135–145)

## 2021-05-24 LAB — HIV ANTIBODY (ROUTINE TESTING W REFLEX): HIV Screen 4th Generation wRfx: NONREACTIVE

## 2021-05-24 MED ORDER — TIZANIDINE HCL 4 MG PO TABS: 4.0000 mg | ORAL_TABLET | Freq: Four times a day (QID) | ORAL | Status: DC | PRN

## 2021-05-24 MED ORDER — ACETAMINOPHEN 650 MG RE SUPP: 650.0000 mg | Freq: Four times a day (QID) | RECTAL | Status: DC | PRN

## 2021-05-24 MED ORDER — ONDANSETRON HCL 4 MG/2ML IJ SOLN
4.0000 mg | Freq: Four times a day (QID) | INTRAMUSCULAR | Status: DC | PRN
  Administered 2021-05-24: 4 mg via INTRAVENOUS
  Filled 2021-05-24: qty 2

## 2021-05-24 MED ORDER — ONDANSETRON HCL 4 MG PO TABS: 4.0000 mg | ORAL_TABLET | Freq: Four times a day (QID) | ORAL | Status: DC | PRN

## 2021-05-24 MED ORDER — FLEET ENEMA 7-19 GM/118ML RE ENEM: 1.0000 | ENEMA | Freq: Once | RECTAL | Status: DC | PRN

## 2021-05-24 MED ORDER — SODIUM CHLORIDE 0.9% FLUSH: 3.0000 mL | INTRAVENOUS | Status: DC | PRN

## 2021-05-24 MED ORDER — OXYCODONE HCL 5 MG PO TABS
5.0000 mg | ORAL_TABLET | ORAL | Status: DC | PRN
  Administered 2021-05-24: 5 mg via ORAL
  Filled 2021-05-24: qty 1

## 2021-05-24 MED ORDER — OXYCODONE-ACETAMINOPHEN 5-325 MG PO TABS
1.0000 | ORAL_TABLET | Freq: Once | ORAL | Status: AC
Start: 2021-05-24 — End: 2021-05-24
  Administered 2021-05-24: 1 via ORAL
  Filled 2021-05-24: qty 1

## 2021-05-24 MED ORDER — ALBUTEROL SULFATE (2.5 MG/3ML) 0.083% IN NEBU: 2.5000 mg | INHALATION_SOLUTION | Freq: Four times a day (QID) | RESPIRATORY_TRACT | Status: DC | PRN

## 2021-05-24 MED ORDER — FLUTICASONE PROPIONATE 50 MCG/ACT NA SUSP
2.0000 | Freq: Every day | NASAL | Status: DC | PRN
  Filled 2021-05-24: qty 16

## 2021-05-24 MED ORDER — DOCUSATE SODIUM 100 MG PO CAPS
100.0000 mg | ORAL_CAPSULE | Freq: Two times a day (BID) | ORAL | Status: DC
  Administered 2021-05-24: 100 mg via ORAL
  Filled 2021-05-24: qty 1

## 2021-05-24 MED ORDER — SODIUM CHLORIDE 0.9 % IV SOLN: 250.0000 mL | INTRAVENOUS | Status: DC | PRN

## 2021-05-24 MED ORDER — SODIUM CHLORIDE 0.9% FLUSH
3.0000 mL | Freq: Two times a day (BID) | INTRAVENOUS | Status: DC
  Administered 2021-05-24: 3 mL via INTRAVENOUS

## 2021-05-24 MED ORDER — ACETAMINOPHEN 325 MG PO TABS: 650.0000 mg | ORAL_TABLET | Freq: Four times a day (QID) | ORAL | Status: DC | PRN

## 2021-05-24 MED ORDER — MECLIZINE HCL 25 MG PO TABS
25.0000 mg | ORAL_TABLET | Freq: Three times a day (TID) | ORAL | Status: DC | PRN
  Filled 2021-05-24: qty 1

## 2021-05-24 MED ORDER — HYDROMORPHONE HCL 1 MG/ML IJ SOLN
0.5000 mg | INTRAMUSCULAR | Status: DC | PRN
  Administered 2021-05-24 – 2021-05-25 (×4): 1 mg via INTRAVENOUS
  Filled 2021-05-24 (×4): qty 1

## 2021-05-24 MED ORDER — POLYETHYLENE GLYCOL 3350 17 G PO PACK: 17.0000 g | PACK | Freq: Every day | ORAL | Status: DC | PRN

## 2021-05-24 MED ORDER — VITAMIN D (ERGOCALCIFEROL) 1.25 MG (50000 UNIT) PO CAPS: 50000.0000 [IU] | ORAL_CAPSULE | ORAL | Status: DC

## 2021-05-24 MED ORDER — BISACODYL 10 MG RE SUPP: 10.0000 mg | Freq: Every day | RECTAL | Status: DC | PRN

## 2021-05-24 NOTE — ED Provider Notes (Signed)
Emergency Medicine Provider Triage Evaluation Note  Kaylee Miller , a 30 y.o. female  was evaluated in triage.  Pt complains of back pain and rle pain. Scheduled for microdiscectomy on 10/5.  Review of Systems  Positive: Back pain/leg pain Negative: fever  Physical Exam  BP 130/84 (BP Location: Right Arm)   Pulse 100   Temp 98.7 F (37.1 C) (Oral)   Resp (!) 22   LMP  (LMP Unknown)   SpO2 99%  Gen:   Awake, no distress   Resp:  Normal effort MSK:   Moves extremities without difficulty  Other:  Ttp to the right lower back and posterior right thigh  Medical Decision Making  Medically screening exam initiated at 12:18 PM.  Appropriate orders placed.  Kaylee Miller was informed that the remainder of the evaluation will be completed by another provider, this initial triage assessment does not replace that evaluation, and the importance of remaining in the ED until their evaluation is complete.     Bishop Dublin 05/24/21 1219    Charlesetta Shanks, MD 05/26/21 [phone]

## 2021-05-24 NOTE — H&P (Signed)
Kaylee Miller is an 30 y.o. female.   Chief Complaint: right lower extremity pain HPI: [name] presents with increasing pain in the right lower extremity. An MRI lumbar spine was performed earlier this week showing the existing disc herniation was now larger. The pain is described as debilitating, and unrelenting. She does not have bowel and or bladder difficulties.   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    S/P C-section 07/28/2013   Sciatic nerve pain    Seasonal allergies     Past Surgical History:  Procedure Laterality Date   adenoids     CESAREAN SECTION  2007   CESAREAN SECTION N/A 07/28/2013   Procedure: CESAREAN SECTION;  Surgeon: Jonnie Kind, MD;  Location: [facility];  Service: Obstetrics;  Laterality: N/A;   tubes in ears      Family History  Problem Relation Age of Onset   Cancer Mother    Breast cancer Mother    Hypertension Father    Stroke Father    CAD Maternal Grandfather    CAD Paternal Grandmother    CAD Paternal Grandfather    Diabetes Other        great-great grandmother   CAD Other    Social History:  reports that she has been smoking cigarettes. She has a 9.00 pack-year smoking history. She has never used smokeless tobacco. She reports current alcohol use. She reports that she does not use drugs.  Allergies:  Allergies  Allergen Reactions   Latex Swelling    (Not in a hospital admission)   Results for orders placed or performed during the hospital encounter of 05/24/21 (from the past 48 hour(s))  Basic metabolic panel     Status: Abnormal   Collection Time: 05/24/21  5:05 PM  Result Value Ref Range   Sodium 138 135 - 145 mmol/L   Potassium 3.3 (L) 3.5 - 5.1 mmol/L   Chloride 105 98 - 111 mmol/L   CO2 25 22 - 32 mmol/L   Glucose, Bld 94 70 - 99 mg/dL     Comment: Glucose reference range applies only to samples taken after fasting for at least 8 hours.   BUN 15 6 - 20 mg/dL   Creatinine, Ser 0.81 0.44 - 1.00 mg/dL   Calcium 9.0 8.9 - 10.3 mg/dL   GFR, Estimated >60 >60 mL/min    Comment: (NOTE) Calculated using the CKD-EPI Creatinine Equation (2021)    Anion gap 8 5 - 15    Comment: Performed at [facility] [address] [place], [place] [address]  CBC     Status: Abnormal   Collection Time: 05/24/21  5:05 PM  Result Value Ref Range   WBC 11.2 (H) 4.0 - 10.5 K/uL   RBC 4.71 3.87 - 5.11 MIL/uL   Hemoglobin 13.1 12.0 - 15.0 g/dL   HCT 40.6 36.0 - 46.0 %   MCV 86.2 80.0 - 100.0 fL   MCH 27.8 26.0 - 34.0 pg   MCHC 32.3 30.0 - 36.0 g/dL   RDW 13.1 11.5 - 15.5 %   Platelets 196 150 - 400 K/uL   nRBC 0.0 0.0 - 0.2 %    Comment: Performed at [facility], [place] [address], [place], [place] [address]  No results found.  Review of Systems  Constitutional: Negative.   HENT: Negative.    Eyes: Negative.   Respiratory: Negative.    Cardiovascular: Negative.   Gastrointestinal: Negative.   Endocrine: Negative.   Musculoskeletal:  Positive for back pain.  Skin: Negative.   Allergic/Immunologic: Negative.   Neurological:  Positive for weakness.  Hematological: Negative.   Psychiatric/Behavioral: Negative.     Blood pressure 125/61, pulse 76, temperature 98.7 F (37.1 C), temperature source Oral, resp. rate (!) 22, SpO2 98 %. Physical Exam Constitutional:      General: She is in acute distress.     Appearance: Normal appearance. She is normal weight.  HENT:     Head: Normocephalic and atraumatic.     Right Ear: Tympanic membrane and external ear normal.     Left Ear: Tympanic membrane and external ear normal.     Nose: Nose normal.     Mouth/Throat:     Mouth: Mucous membranes are moist.     Pharynx: Oropharynx is clear.  Eyes:     Extraocular Movements: Extraocular movements intact.     Pupils: Pupils are  equal, round, and reactive to light.  Cardiovascular:     Rate and Rhythm: Normal rate and regular rhythm.     Pulses: Normal pulses.     Heart sounds: Normal heart sounds.  Pulmonary:     Effort: Pulmonary effort is normal.     Breath sounds: Normal breath sounds.  Abdominal:     General: Abdomen is flat.     Palpations: Abdomen is soft.  Musculoskeletal:     Cervical back: Normal range of motion and neck supple.  Neurological:     Mental Status: She is alert and oriented to person, place, and time.     Cranial Nerves: Cranial nerves are intact.     Sensory: Sensory deficit present.     Motor: Weakness present.     Coordination: Coordination is intact.     Gait: Gait abnormal.     Comments: Weakness right plantar flexors, 4/5 Mildly decreased pinprick right S1 distribution Gait is antalgic     Assessment/Plan [name] is a 30 y.o. female With a large displaced disc at L5/S1, and her second trip to the ED this week. I also saw her in the office, and we planned to remove the disc next week. She has stated the pain is simply too much to bear. I will therefore perform the discetomy tomorrow.   Ashok Pall, MD 05/24/2021, 6:47 PM

## 2021-05-24 NOTE — ED Triage Notes (Signed)
Pt here d/t back pain. Seen by orthopedic on regular basis. Pt states she had bulging disk. 10/10 pain. Increased pain beginning yesterday.

## 2021-05-25 ENCOUNTER — Other Ambulatory Visit: Payer: Self-pay

## 2021-05-25 ENCOUNTER — Ambulatory Visit: Admit: 2021-05-25 | Payer: Medicaid Other | Admitting: Neurosurgery

## 2021-05-25 ENCOUNTER — Observation Stay (HOSPITAL_COMMUNITY): Payer: Medicaid Other | Admitting: Certified Registered"

## 2021-05-25 ENCOUNTER — Encounter (HOSPITAL_COMMUNITY): Admission: EM | Disposition: A | Discharge status: Home / Self Care | Payer: Self-pay | Source: Home / Self Care

## 2021-05-25 ENCOUNTER — Observation Stay (HOSPITAL_COMMUNITY): Payer: Medicaid Other

## 2021-05-25 ENCOUNTER — Encounter (HOSPITAL_COMMUNITY): Payer: Self-pay | Admitting: Neurosurgery

## 2021-05-25 DIAGNOSIS — J45909 Unspecified asthma, uncomplicated: Secondary | ICD-10-CM | POA: Diagnosis not present

## 2021-05-25 DIAGNOSIS — I10 Essential (primary) hypertension: Secondary | ICD-10-CM | POA: Diagnosis not present

## 2021-05-25 DIAGNOSIS — G43909 Migraine, unspecified, not intractable, without status migrainosus: Secondary | ICD-10-CM | POA: Diagnosis not present

## 2021-05-25 DIAGNOSIS — Z9889 Other specified postprocedural states: Secondary | ICD-10-CM | POA: Diagnosis not present

## 2021-05-25 DIAGNOSIS — F17210 Nicotine dependence, cigarettes, uncomplicated: Secondary | ICD-10-CM | POA: Diagnosis not present

## 2021-05-25 DIAGNOSIS — Z20822 Contact with and (suspected) exposure to covid-19: Secondary | ICD-10-CM | POA: Diagnosis not present

## 2021-05-25 DIAGNOSIS — M5127 Other intervertebral disc displacement, lumbosacral region: Secondary | ICD-10-CM | POA: Diagnosis not present

## 2021-05-25 DIAGNOSIS — M5126 Other intervertebral disc displacement, lumbar region: Secondary | ICD-10-CM | POA: Diagnosis not present

## 2021-05-25 DIAGNOSIS — M48062 Spinal stenosis, lumbar region with neurogenic claudication: Secondary | ICD-10-CM | POA: Diagnosis not present

## 2021-05-25 HISTORY — PX: LUMBAR LAMINECTOMY/DECOMPRESSION MICRODISCECTOMY: SHX5026

## 2021-05-25 LAB — SURGICAL PCR SCREEN
MRSA, PCR: NEGATIVE
Staphylococcus aureus: POSITIVE — AB

## 2021-05-25 LAB — SARS CORONAVIRUS 2 (TAT 6-24 HRS): SARS Coronavirus 2: NEGATIVE

## 2021-05-25 LAB — POC URINE PREG, ED: Preg Test, Ur: NEGATIVE

## 2021-05-25 SURGERY — LUMBAR LAMINECTOMY/DECOMPRESSION MICRODISCECTOMY 1 LEVEL
Anesthesia: General | Site: Back | Laterality: Right

## 2021-05-25 MED ORDER — THROMBIN 5000 UNITS EX SOLR
CUTANEOUS | Status: AC
  Filled 2021-05-25: qty 5000

## 2021-05-25 MED ORDER — HYDROMORPHONE HCL 1 MG/ML IJ SOLN: 0.2500 mg | INTRAMUSCULAR | Status: DC | PRN

## 2021-05-25 MED ORDER — LACTATED RINGERS IV SOLN: INTRAVENOUS | Status: DC

## 2021-05-25 MED ORDER — MIDAZOLAM HCL 2 MG/2ML IJ SOLN
INTRAMUSCULAR | Status: AC
  Filled 2021-05-25: qty 2

## 2021-05-25 MED ORDER — HEMOSTATIC AGENTS (NO CHARGE) OPTIME
TOPICAL | Status: DC | PRN
  Administered 2021-05-25: 1 via TOPICAL

## 2021-05-25 MED ORDER — BUPIVACAINE HCL (PF) 0.5 % IJ SOLN
INTRAMUSCULAR | Status: DC | PRN
  Administered 2021-05-25: 20 mL
  Administered 2021-05-25: 10 mL

## 2021-05-25 MED ORDER — ACETAMINOPHEN 160 MG/5ML PO SOLN: 325.0000 mg | Freq: Once | ORAL | Status: DC | PRN

## 2021-05-25 MED ORDER — THROMBIN 20000 UNITS EX SOLR
CUTANEOUS | Status: DC | PRN
  Administered 2021-05-25: 20000 [IU] via TOPICAL

## 2021-05-25 MED ORDER — ACETAMINOPHEN 325 MG PO TABS: 325.0000 mg | ORAL_TABLET | Freq: Once | ORAL | Status: DC | PRN

## 2021-05-25 MED ORDER — CHLORHEXIDINE GLUCONATE 0.12 % MT SOLN
OROMUCOSAL | Status: AC
  Administered 2021-05-25: 15 mL via OROMUCOSAL
  Filled 2021-05-25: qty 15

## 2021-05-25 MED ORDER — THROMBIN 20000 UNITS EX SOLR
CUTANEOUS | Status: AC
  Filled 2021-05-25: qty 20000

## 2021-05-25 MED ORDER — AMISULPRIDE (ANTIEMETIC) 5 MG/2ML IV SOLN: 10.0000 mg | Freq: Once | INTRAVENOUS | Status: DC | PRN

## 2021-05-25 MED ORDER — CEFAZOLIN SODIUM-DEXTROSE 2-4 GM/100ML-% IV SOLN
2.0000 g | INTRAVENOUS | Status: AC
Start: 2021-05-29 — End: 2021-05-25
  Administered 2021-05-25: 2 g via INTRAVENOUS

## 2021-05-25 MED ORDER — FENTANYL CITRATE (PF) 250 MCG/5ML IJ SOLN
INTRAMUSCULAR | Status: DC | PRN
  Administered 2021-05-25: 100 ug via INTRAVENOUS
  Administered 2021-05-25 (×3): 25 ug via INTRAVENOUS

## 2021-05-25 MED ORDER — CHLORHEXIDINE GLUCONATE 0.12 % MT SOLN: 15.0000 mL | Freq: Once | OROMUCOSAL | Status: AC

## 2021-05-25 MED ORDER — ORAL CARE MOUTH RINSE: 15.0000 mL | Freq: Once | OROMUCOSAL | Status: AC

## 2021-05-25 MED ORDER — CHLORHEXIDINE GLUCONATE CLOTH 2 % EX PADS: 6.0000 | MEDICATED_PAD | Freq: Once | CUTANEOUS | Status: DC

## 2021-05-25 MED ORDER — SUGAMMADEX SODIUM 200 MG/2ML IV SOLN
INTRAVENOUS | Status: DC | PRN
  Administered 2021-05-25: 400 mg via INTRAVENOUS

## 2021-05-25 MED ORDER — FENTANYL CITRATE (PF) 250 MCG/5ML IJ SOLN
INTRAMUSCULAR | Status: AC
  Filled 2021-05-25: qty 5

## 2021-05-25 MED ORDER — VANCOMYCIN HCL IN DEXTROSE 1-5 GM/200ML-% IV SOLN: 1000.0000 mg | INTRAVENOUS | Status: DC

## 2021-05-25 MED ORDER — CEFAZOLIN SODIUM-DEXTROSE 2-4 GM/100ML-% IV SOLN
INTRAVENOUS | Status: AC
  Filled 2021-05-25: qty 100

## 2021-05-25 MED ORDER — THROMBIN 5000 UNITS EX SOLR
OROMUCOSAL | Status: DC | PRN
  Administered 2021-05-25: 5 mL via TOPICAL

## 2021-05-25 MED ORDER — ROCURONIUM BROMIDE 10 MG/ML (PF) SYRINGE
PREFILLED_SYRINGE | INTRAVENOUS | Status: DC | PRN
  Administered 2021-05-25: 60 mg via INTRAVENOUS
  Administered 2021-05-25: 20 mg via INTRAVENOUS

## 2021-05-25 MED ORDER — MIDAZOLAM HCL 2 MG/2ML IJ SOLN
INTRAMUSCULAR | Status: DC | PRN
Start: 2021-05-25 — End: 2021-05-25
  Administered 2021-05-25: 2 mg via INTRAVENOUS

## 2021-05-25 MED ORDER — DEXAMETHASONE SODIUM PHOSPHATE 10 MG/ML IJ SOLN
INTRAMUSCULAR | Status: DC | PRN
  Administered 2021-05-25: 10 mg via INTRAVENOUS

## 2021-05-25 MED ORDER — ONDANSETRON HCL 4 MG/2ML IJ SOLN
INTRAMUSCULAR | Status: DC | PRN
  Administered 2021-05-25: 4 mg via INTRAVENOUS

## 2021-05-25 MED ORDER — MEPERIDINE HCL 25 MG/ML IJ SOLN: 6.2500 mg | INTRAMUSCULAR | Status: DC | PRN

## 2021-05-25 MED ORDER — ACETAMINOPHEN 10 MG/ML IV SOLN: 1000.0000 mg | Freq: Once | INTRAVENOUS | Status: DC | PRN

## 2021-05-25 MED ORDER — 0.9 % SODIUM CHLORIDE (POUR BTL) OPTIME
TOPICAL | Status: DC | PRN
  Administered 2021-05-25: 1000 mL

## 2021-05-25 MED ORDER — PROMETHAZINE HCL 25 MG/ML IJ SOLN
6.2500 mg | INTRAMUSCULAR | Status: DC | PRN
  Administered 2021-05-25: 6.25 mg via INTRAVENOUS

## 2021-05-25 MED ORDER — BUPIVACAINE-EPINEPHRINE 0.5% -1:200000 IJ SOLN
INTRAMUSCULAR | Status: AC
  Filled 2021-05-25: qty 1

## 2021-05-25 MED ORDER — BUPIVACAINE HCL (PF) 0.5 % IJ SOLN
INTRAMUSCULAR | Status: AC
  Filled 2021-05-25: qty 30

## 2021-05-25 MED ORDER — PROPOFOL 10 MG/ML IV BOLUS
INTRAVENOUS | Status: AC
  Filled 2021-05-25: qty 20

## 2021-05-25 MED ORDER — LACTATED RINGERS IV SOLN: INTRAVENOUS | Status: DC | PRN

## 2021-05-25 MED ORDER — LIDOCAINE 2% (20 MG/ML) 5 ML SYRINGE
INTRAMUSCULAR | Status: DC | PRN
  Administered 2021-05-25: 40 mg via INTRAVENOUS

## 2021-05-25 MED ORDER — PROPOFOL 10 MG/ML IV BOLUS
INTRAVENOUS | Status: DC | PRN
  Administered 2021-05-25: 140 mg via INTRAVENOUS

## 2021-05-25 MED ORDER — PROMETHAZINE HCL 25 MG/ML IJ SOLN
INTRAMUSCULAR | Status: AC
  Filled 2021-05-25: qty 1

## 2021-05-25 SURGICAL SUPPLY — 53 items
ADH SKN CLS APL DERMABOND .7 (GAUZE/BANDAGES/DRESSINGS) ×1
APL SKNCLS STERI-STRIP NONHPOA (GAUZE/BANDAGES/DRESSINGS)
BAG COUNTER SPONGE SURGICOUNT (BAG) ×2 IMPLANT
BAG SPNG CNTER NS LX DISP (BAG) ×1
BAND INSRT 18 STRL LF DISP RB (MISCELLANEOUS) ×2
BAND RUBBER #18 3X1/16 STRL (MISCELLANEOUS) ×4 IMPLANT
BENZOIN TINCTURE PRP APPL 2/3 (GAUZE/BANDAGES/DRESSINGS) IMPLANT
BLADE CLIPPER SURG (BLADE) IMPLANT
BUR MATCHSTICK NEURO 3.0 LAGG (BURR) ×2 IMPLANT
BUR PRECISION FLUTE 5.0 (BURR) ×1 IMPLANT
CANISTER SUCT 3000ML PPV (MISCELLANEOUS) ×2 IMPLANT
CARTRIDGE OIL MAESTRO DRILL (MISCELLANEOUS) ×1 IMPLANT
DECANTER SPIKE VIAL GLASS SM (MISCELLANEOUS) ×2 IMPLANT
DERMABOND ADVANCED (GAUZE/BANDAGES/DRESSINGS) ×1
DERMABOND ADVANCED .7 DNX12 (GAUZE/BANDAGES/DRESSINGS) ×1 IMPLANT
DIFFUSER DRILL AIR PNEUMATIC (MISCELLANEOUS) ×2 IMPLANT
DRAPE LAPAROTOMY 100X72X124 (DRAPES) ×2 IMPLANT
DRAPE MICROSCOPE LEICA (MISCELLANEOUS) ×2 IMPLANT
DRAPE SURG 17X23 STRL (DRAPES) ×2 IMPLANT
DURAPREP 26ML APPLICATOR (WOUND CARE) ×2 IMPLANT
ELECT REM PT RETURN 9FT ADLT (ELECTROSURGICAL) ×2
ELECTRODE REM PT RTRN 9FT ADLT (ELECTROSURGICAL) ×1 IMPLANT
GAUZE 4X4 16PLY ~~LOC~~+RFID DBL (SPONGE) IMPLANT
GAUZE SPONGE 4X4 12PLY STRL (GAUZE/BANDAGES/DRESSINGS) IMPLANT
GLOVE EXAM NITRILE XL STR (GLOVE) IMPLANT
GLOVE SURG LTX SZ6.5 (GLOVE) ×2 IMPLANT
GLOVE SURG POLYISO LF SZ6.5 (GLOVE) ×2 IMPLANT
GLOVE SURG POLYISO LF SZ7 (GLOVE) ×2 IMPLANT
GOWN STRL REUS W/ TWL LRG LVL3 (GOWN DISPOSABLE) ×2 IMPLANT
GOWN STRL REUS W/ TWL XL LVL3 (GOWN DISPOSABLE) IMPLANT
GOWN STRL REUS W/TWL 2XL LVL3 (GOWN DISPOSABLE) IMPLANT
GOWN STRL REUS W/TWL LRG LVL3 (GOWN DISPOSABLE) ×4
GOWN STRL REUS W/TWL XL LVL3 (GOWN DISPOSABLE)
KIT BASIN OR (CUSTOM PROCEDURE TRAY) ×2 IMPLANT
KIT TURNOVER KIT B (KITS) ×2 IMPLANT
NDL HYPO 25X1 1.5 SAFETY (NEEDLE) ×1 IMPLANT
NDL SPNL 18GX3.5 QUINCKE PK (NEEDLE) IMPLANT
NEEDLE HYPO 25X1 1.5 SAFETY (NEEDLE) ×2 IMPLANT
NEEDLE SPNL 18GX3.5 QUINCKE PK (NEEDLE) ×2 IMPLANT
NS IRRIG 1000ML POUR BTL (IV SOLUTION) ×2 IMPLANT
OIL CARTRIDGE MAESTRO DRILL (MISCELLANEOUS) ×2
PACK LAMINECTOMY NEURO (CUSTOM PROCEDURE TRAY) ×2 IMPLANT
PAD ARMBOARD 7.5X6 YLW CONV (MISCELLANEOUS) ×5 IMPLANT
SPONGE SURGIFOAM ABS GEL SZ50 (HEMOSTASIS) ×2 IMPLANT
SPONGE T-LAP 4X18 ~~LOC~~+RFID (SPONGE) ×1 IMPLANT
STRIP CLOSURE SKIN 1/2X4 (GAUZE/BANDAGES/DRESSINGS) IMPLANT
SUT VIC AB 0 CT1 18XCR BRD8 (SUTURE) ×1 IMPLANT
SUT VIC AB 0 CT1 8-18 (SUTURE) ×2
SUT VIC AB 2-0 CT1 18 (SUTURE) ×2 IMPLANT
SUT VIC AB 3-0 SH 8-18 (SUTURE) ×2 IMPLANT
TOWEL GREEN STERILE (TOWEL DISPOSABLE) ×2 IMPLANT
TOWEL GREEN STERILE FF (TOWEL DISPOSABLE) ×2 IMPLANT
WATER STERILE IRR 1000ML POUR (IV SOLUTION) ×2 IMPLANT

## 2021-05-25 NOTE — Discharge Summary (Signed)
Physician Discharge Summary  Patient ID: Kaylee Miller MRN: [id number] DOB/AGE: June 22, 1991 30 y.o.  Admit date: 05/24/2021 Discharge date: 05/25/2021  Admission Diagnoses:hnp L5/S1 right  Discharge Diagnoses:  Active Problems:   Herniated nucleus pulposus, L5-S1, right   Discharged Condition: good  Hospital Course: [name] was admitted via the ED and subsequently taken to the OR for a lumbar discetomy at L5/S1. Post op she is voiding, ambulating, and tolerating a regular diet. Her wound is clean, dry, and without signs of infection.   Treatments: surgery: L5/S1 discetomy, right  Discharge Exam: Blood pressure 119/72, pulse (!) 58, temperature 98.7 F (37.1 C), temperature source Oral, resp. rate 19, SpO2 99 %. General appearance: alert, cooperative, appears stated age, and mild distress  Disposition: Discharge disposition: 01-Home or Self Care      Disc Herniation Lumbar five - Sacral one, RIGHT  Allergies as of 05/25/2021       Reactions   Latex Swelling        Medication List     TAKE these medications    albuterol 108 (90 Base) MCG/ACT inhaler Commonly known as: VENTOLIN HFA Inhale 2 puffs into the lungs every 6 (six) hours as needed for wheezing or shortness of breath.   aspirin-acetaminophen-caffeine 250-250-65 MG tablet Commonly known as: EXCEDRIN MIGRAINE Take 1 tablet by mouth every 6 (six) hours as needed for migraine.   Doans Extra Strength 580 MG Tabs Generic drug: Magnesium Salicylate Tetrahyd Take 1 tablet by mouth daily as needed (For back pain).   docusate sodium 100 MG capsule Commonly known as: COLACE Take 100 mg by mouth 2 (two) times daily.   fluticasone 50 MCG/ACT nasal spray Commonly known as: FLONASE Place 2 sprays into both nostrils daily as needed for allergies or rhinitis.   HYDROcodone-acetaminophen 5-325 MG tablet Commonly known as: Norco Take 1 tablet by mouth every 4 (four) hours as needed for severe pain.    ibuprofen 200 MG tablet Commonly known as: ADVIL Take 600 mg by mouth every 6 (six) hours as needed (For cramps).   meclizine 25 MG tablet Commonly known as: ANTIVERT Take 1 tablet (25 mg total) by mouth 3 (three) times daily as needed for dizziness.   medroxyPROGESTERone Acetate 150 MG/ML Susy INJECT 1 ML INTO THE MUSCLE EVERY 3 MONTHS What changed: See the new instructions.   ondansetron 4 MG disintegrating tablet Commonly known as: Zofran ODT Take 1 tablet (4 mg total) by mouth every 8 (eight) hours as needed for nausea or vomiting.   oxyCODONE 5 MG immediate release tablet Commonly known as: Oxy IR/ROXICODONE Take 5 mg by mouth every 6 (six) hours as needed for pain.   predniSONE 20 MG tablet Commonly known as: DELTASONE 3 tabs po daily x 3 days, then 2 tabs x 3 days, then 1.5 tabs x 3 days, then 1 tab x 3 days, then 0.5 tabs x 3 days What changed:  how much to take how to take this when to take this   SUMAtriptan 50 MG tablet Commonly known as: IMITREX Take 50 mg by mouth every 2 (two) hours as needed for migraine.   tiZANidine 4 MG tablet Commonly known as: Zanaflex Take 1 tablet (4 mg total) by mouth every 6 (six) hours as needed for muscle spasms.   Vitamin D (Ergocalciferol) 1.25 MG (50000 UNIT) Caps capsule Commonly known as: DRISDOL Take 1 capsule (50,000 Units total) by mouth every 7 (seven) days.        Follow-up Information  Ashok Pall, MD Follow up in 3 week(s).   Specialty: Neurosurgery Why: please call to make an appointment Contact information: [address] [place] [address] [phone]                 Signed: Ashok Pall 05/25/2021, 12:59 PM

## 2021-05-25 NOTE — Anesthesia Preprocedure Evaluation (Addendum)
Anesthesia Evaluation  Patient identified by MRN, date of birth, ID band Patient awake    Reviewed: Allergy & Precautions, NPO status , Patient's Chart, lab work & pertinent test results  Airway Mallampati: II  TM Distance: >3 FB Neck ROM: Full    Dental  (+) Teeth Intact, Dental Advisory Given   Pulmonary asthma , Current Smoker and Patient abstained from smoking.,    breath sounds clear to auscultation       Cardiovascular hypertension,  Rhythm:Regular Rate:Normal     Neuro/Psych  Headaches, negative psych ROS   GI/Hepatic negative GI ROS, Neg liver ROS,   Endo/Other  negative endocrine ROS  Renal/GU negative Renal ROS     Musculoskeletal negative musculoskeletal ROS (+)   Abdominal Normal abdominal exam  (+)   Peds  Hematology negative hematology ROS (+)   Anesthesia Other Findings   Reproductive/Obstetrics                            Anesthesia Physical Anesthesia Plan  ASA: 2  Anesthesia Plan: General   Post-op Pain Management:    Induction: Intravenous  PONV Risk Score and Plan: 3 and Ondansetron, Dexamethasone and Midazolam  Airway Management Planned: Oral ETT  Additional Equipment: None  Intra-op Plan:   Post-operative Plan: Extubation in OR  Informed Consent: I have reviewed the patients History and Physical, chart, labs and discussed the procedure including the risks, benefits and alternatives for the proposed anesthesia with the patient or authorized representative who has indicated his/her understanding and acceptance.     Dental advisory given  Plan Discussed with: CRNA  Anesthesia Plan Comments:        Anesthesia Quick Evaluation

## 2021-05-25 NOTE — ED Notes (Signed)
Pt resting on stretcher in position of comfort. Purewick remains in place. Pt denies any complaints at this time. Mom at bedside. No acute changes noted. Will continue to monitor.

## 2021-05-25 NOTE — Anesthesia Postprocedure Evaluation (Signed)
Anesthesia Post Note  Patient: Kaylee Miller  Procedure(s) Performed: DISCECTOMY Lumbar five - Sacral one (Right: Back)     Patient location during evaluation: PACU Anesthesia Type: General Level of consciousness: awake and alert Pain management: pain level controlled Vital Signs Assessment: post-procedure vital signs reviewed and stable Respiratory status: spontaneous breathing, nonlabored ventilation, respiratory function stable and patient connected to nasal cannula oxygen Cardiovascular status: blood pressure returned to baseline and stable Postop Assessment: no apparent nausea or vomiting Anesthetic complications: no   No notable events documented.  Last Vitals:  Vitals:   05/25/21 1212 05/25/21 1256  BP: 119/76 119/72  Pulse: 81 (!) 58  Resp: (!) 24 19  Temp: 37 C 37.1 C  SpO2: 99% 99%    Last Pain:  Vitals:   05/25/21 1300  TempSrc:   PainSc: Teresita

## 2021-05-25 NOTE — Discharge Instructions (Signed)
Lumbar Discectomy °Care After °A discectomy involves removal of discmaterial (the cartilage-like structures located between the bones of the back). It is done to relieve pressure on nerve roots. It can be used as a treatment for a back problem. The time in surgery depends on the findings in surgery and what is necessary to correct the problems. °HOME CARE INSTRUCTIONS  °· Check the cut (incision) made by the surgeon twice a day for signs of infection. Some signs of infection may include:  °· A foul smelling, greenish or yellowish discharge from the wound.  °· Increased pain.  °· Increased redness over the incision (operative) site.  °· The skin edges may separate.  °· Flu-like symptoms (problems).  °· A temperature above 101.5° F (38.6° C).  °· Change your bandages in about 24 to 36 hours following surgery or as directed.  °· You may shower tomrrow.  Avoid bathtubs, swimming pools and hot tubs for three weeks or until your incision has healed completely. °· Follow your doctor's instructions as to safe activities, exercises, and physical therapy.  °· Weight reduction may be beneficial if you are overweight.  °· Daily exercise is helpful to prevent the return of problems. Walking is permitted. You may use a treadmill without an incline. Cut down on activities and exercise if you have discomfort. You may also go up and down stairs as much as you can tolerate.  °· DO NOT lift anything heavier than 10 to 15 lbs. Avoid bending or twisting at the waist. Always bend your knees when lifting.  °· Maintain strength and range of motion as instructed.  °· Do not drive for 10 days, or as directed by your doctors. You may be a passenger . Lying back in the passenger seat may be more comfortable for you. Always wear a seatbelt.  °· Limit your sitting in a regular chair to 20 to 30 minutes at a time. There are no limitations for sitting in a recliner. You should lie down or walk in between sitting periods.  °· Only take  over-the-counter or prescription medicines for pain, discomfort, or fever as directed by your caregiver.  °SEEK MEDICAL CARE IF:  °· There is increased bleeding (more than a small spot) from the wound.  °· You notice redness, swelling, or increasing pain in the wound.  °· Pus is coming from wound.  °· You develop an unexplained oral temperature above 102° F (38.9° C) develops.  °· You notice a foul smell coming from the wound or dressing.  °· You have increasing pain in your wound.  °SEEK IMMEDIATE MEDICAL CARE IF:  °· You develop a rash.  °· You have difficulty breathing.  °· You develop any allergic problems to medicines given.  °Document Released: 07/16/2004 Document Revised: 07/31/2011 Document Reviewed: 11/04/2007 °ExitCare® Patient Information °

## 2021-05-25 NOTE — Anesthesia Procedure Notes (Signed)
Procedure Name: Intubation Date/Time: 05/25/2021 9:58 AM Performed by: Thelma Comp, CRNA Pre-anesthesia Checklist: Patient identified, Emergency Drugs available, Suction available and Patient being monitored Patient Re-evaluated:Patient Re-evaluated prior to induction Oxygen Delivery Method: Circle System Utilized Preoxygenation: Pre-oxygenation with 100% oxygen Induction Type: IV induction Ventilation: Mask ventilation without difficulty Laryngoscope Size: Mac and 3 Grade View: Grade I Tube type: Oral Tube size: 7.0 mm Number of attempts: 1 Airway Equipment and Method: Stylet Placement Confirmation: ETT inserted through vocal cords under direct vision, positive ETCO2 and breath sounds checked- equal and bilateral Secured at: 22 cm Tube secured with: Tape Dental Injury: Teeth and Oropharynx as per pre-operative assessment

## 2021-05-25 NOTE — Op Note (Signed)
05/25/2021  12:35 PM  PATIENT:  Kaylee Miller  30 y.o. female  PRE-OPERATIVE DIAGNOSIS:  Disc Herniation Lumbar five - Sacral one, RIGHT  POST-OPERATIVE DIAGNOSIS:   Disc Herniation Lumbar five - Sacral one, RIGHT  PROCEDURE:  Procedure(s): DISCECTOMY Lumbar five - Sacral one  SURGEON:   Surgeon(s): Ashok Pall, MD  ASSISTANTS:none  ANESTHESIA:   general  EBL:  Total I/O In: 600 [I.V.:600] Out: 30 [Blood:30]  BLOOD ADMINISTERED:none  CELL SAVER GIVEN:none  COUNT:per nursing  DRAINS: none   SPECIMEN:  No Specimen  DICTATION: Ms. Nader was taken to the operating room, intubated and placed under a general anesthetic without difficulty. She was positioned prone on a Wilson frame with all pressure points padded. Her back was prepped and draped in a sterile manner. I opened the skin with a 10 blade and carried the dissection down to the thoracolumbar fascia. I used both sharp dissection and the monopolar cautery to expose the lamina of L5, and S1. I confirmed my location with an intraoperative xray.  I used Kerrison punches, and curettes to perform a semihemilaminectomy of L5. I used the punches to remove the ligamentum flavum to expose the thecal sac. I brought the microscope into the operative field and started the decompression of the spinal canal, thecal sac and S1 root(s). I cauterized epidural veins overlying the disc space then divided them sharply. I opened the disc space with a 15 blade and proceeded with the discectomy. I used pituitary rongeurs, curettes, and other instruments to remove disc material. After the discectomy was completed I inspected the S1 nerve root and felt it was well decompressed. I explored rostrally, laterally, medially, and caudally and was satisfied with the decompression. I irrigated the wound, then closed in layers. I approximated the thoracolumbar fascia, subcutaneous, and subcuticular planes with vicryl sutures. I used dermabond for a  sterile dressing.   PLAN OF CARE: Admit for overnight observation  PATIENT DISPOSITION:  PACU - hemodynamically stable.   Delay start of Pharmacological VTE agent (>24hrs) due to surgical blood loss or risk of bleeding:  no

## 2021-05-25 NOTE — ED Notes (Signed)
Pt is going to [facility].

## 2021-05-25 NOTE — Progress Notes (Signed)
Pt discharged home with parents in stable condition. Pt tolerated a regular diet, voided, and ambulated in the room. Discharge instructions given. Pt verbalized understanding. No immediate questions or concerns at this time. Discharged from unit via wheelchair.

## 2021-05-25 NOTE — Progress Notes (Signed)
Patient ID: Kaylee Miller, female   DOB: 1990/10/10, 30 y.o.   MRN: [id number] BP 125/65   Pulse 75   Temp 98 F (36.7 C) (Oral)   Resp 16   LMP  (LMP Unknown)   SpO2 99%  Alert and oriented x 4 Moving all extremities Ready for OR

## 2021-05-25 NOTE — Transfer of Care (Signed)
Immediate Anesthesia Transfer of Care Note  Patient: [name]  Procedure(s) Performed: DISCECTOMY Lumbar five - Sacral one (Right: Back)  Patient Location: PACU  Anesthesia Type:General  Level of Consciousness: drowsy and patient cooperative  Airway & Oxygen Therapy: Patient Spontanous Breathing and Patient connected to face mask oxygen  Post-op Assessment: Report given to RN and Post -op Vital signs reviewed and stable  Post vital signs: Reviewed and stable  Last Vitals:  Vitals Value Taken Time  BP 116/70 05/25/21 1142  Temp    Pulse 69 05/25/21 1144  Resp 26 05/25/21 1144  SpO2 100 % 05/25/21 1144  Vitals shown include unvalidated device data.  Last Pain:  Vitals:   05/25/21 0803  TempSrc: Oral  PainSc:          Complications: No notable events documented.

## 2021-05-26 ENCOUNTER — Encounter (HOSPITAL_COMMUNITY): Payer: Self-pay | Admitting: Neurosurgery

## 2021-05-27 ENCOUNTER — Telehealth: Payer: Self-pay

## 2021-05-27 NOTE — Telephone Encounter (Signed)
Transition Care Management Unsuccessful Follow-up Telephone Call  Date of discharge and from where:  05/25/21 [facility]  Attempts:  1st Attempt  Reason for unsuccessful TCM follow-up call:  No answer/busy

## 2021-05-28 ENCOUNTER — Ambulatory Visit (HOSPITAL_COMMUNITY): Admission: RE | Admit: 2021-05-28 | Payer: Medicaid Other | Source: Ambulatory Visit

## 2021-05-28 ENCOUNTER — Ambulatory Visit: Payer: Medicaid Other

## 2021-05-28 ENCOUNTER — Encounter (HOSPITAL_COMMUNITY): Payer: Self-pay

## 2021-05-28 ENCOUNTER — Inpatient Hospital Stay (HOSPITAL_COMMUNITY): Admission: RE | Admit: 2021-05-28 | Payer: Medicaid Other | Source: Ambulatory Visit

## 2021-05-29 ENCOUNTER — Ambulatory Visit (HOSPITAL_COMMUNITY): Payer: Medicaid Other | Attending: Otolaryngology | Admitting: Physical Therapy

## 2021-05-29 DIAGNOSIS — R42 Dizziness and giddiness: Secondary | ICD-10-CM | POA: Insufficient documentation

## 2021-06-03 ENCOUNTER — Ambulatory Visit (INDEPENDENT_AMBULATORY_CARE_PROVIDER_SITE_OTHER): Payer: Medicaid Other | Admitting: *Deleted

## 2021-06-03 ENCOUNTER — Other Ambulatory Visit: Payer: Self-pay

## 2021-06-03 DIAGNOSIS — Z3042 Encounter for surveillance of injectable contraceptive: Secondary | ICD-10-CM | POA: Diagnosis not present

## 2021-06-03 MED ORDER — MEDROXYPROGESTERONE ACETATE 150 MG/ML IM SUSP
150.0000 mg | Freq: Once | INTRAMUSCULAR | Status: AC
  Administered 2021-06-03: 150 mg via INTRAMUSCULAR

## 2021-06-03 NOTE — Progress Notes (Signed)
   NURSE VISIT- INJECTION  SUBJECTIVE:  Kaylee Miller is a 30 y.o. [id number] female here for a Depo Provera for contraception/period management. She is a GYN patient.   OBJECTIVE:  There were no vitals taken for this visit.  Appears well, in no apparent distress  Injection administered in: Right upper quad. gluteus  Meds ordered this encounter  Medications   medroxyPROGESTERone (DEPO-PROVERA) injection 150 mg    ASSESSMENT: GYN patient Depo Provera for contraception/period management PLAN: Follow-up: in 11-13 weeks for next Depo   Kaylee Miller  06/03/2021 3:28 PM

## 2021-06-24 ENCOUNTER — Other Ambulatory Visit: Payer: Self-pay

## 2021-06-24 ENCOUNTER — Ambulatory Visit (HOSPITAL_COMMUNITY): Payer: Medicaid Other

## 2021-06-24 DIAGNOSIS — R42 Dizziness and giddiness: Secondary | ICD-10-CM | POA: Diagnosis not present

## 2021-06-24 NOTE — Therapy (Signed)
[place], [place], [address] Phone: [phone]   Fax:  [phone]  Physical Therapy Evaluation  Patient Details  Name: Kaylee Miller MRN: [id number] Date of Birth: October 23, 1990 Referring Provider (PT): Leta Baptist MD   Encounter Date: 06/24/2021   PT End of Session - 06/24/21 0948     Visit Number 1    Number of Visits 3    Date for PT Re-Evaluation 07/22/21    Authorization Type [place] Medicaid [employer]- auth required, sheet filled out on eval    PT Start Time 0945    PT Stop Time 1030    PT Time Calculation (min) 45 min    Activity Tolerance Patient tolerated treatment well    Behavior During Therapy [facility] for tasks assessed/performed             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    S/P C-section 07/28/2013   Sciatic nerve pain    Seasonal allergies     Past Surgical History:  Procedure Laterality Date   adenoids     CESAREAN SECTION  2007   CESAREAN SECTION N/A 07/28/2013   Procedure: CESAREAN SECTION;  Surgeon: Jonnie Kind, MD;  Location: [facility];  Service: Obstetrics;  Laterality: N/A;   LUMBAR LAMINECTOMY/DECOMPRESSION MICRODISCECTOMY Right 05/25/2021   Procedure: DISCECTOMY Lumbar five - Sacral one;  Surgeon: Ashok Pall, MD;  Location: [facility];  Service: Neurosurgery;  Laterality: Right;   tubes in ears      There were no vitals filed for this visit.    Subjective Assessment - 06/24/21 0950     Subjective May of 2022 notes spontaneous onset of dizziness with certain head positions and had debilitating dizziness in this episode. Pt went to urgent care and had received  Meclozine and zofran to treat. Notes the next day she awoke and dizziness was improved but continued nausea. Pt had been taking the  Meclazine only on occasion when she feels some symptoms coming on.  Pt denies any regularity of these vertigo symptoms and notes no pattern of regularity. Pt notes her symptoms usually only occur at work.    Diagnostic tests Pt underwent vertigo diagnostic testing with ENT or neurologist. Pt endorses Epley maneuver and improved symptoms since that time    Currently in Pain? No/denies                [facility] PT Assessment - 06/24/21 0001       Assessment   Medical Diagnosis dizziness    Referring Provider (PT) Leta Baptist MD    Onset Date/Surgical Date 12/23/20      Balance Screen   Has the patient fallen in the past 6 months No    Has the patient had a decrease in activity level because of a fear of falling?  No    Is the patient reluctant to leave their home because of a fear of falling?  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 Pt has since been to ENT and received vestibular diagnostic and tx with improving symptonms    Type of Dizziness  Spinning;Vertigo    Frequency of Dizziness sporadic    Duration of Dizziness 30 min with lingering symptoms x 3 days    Symptom Nature Motion provoked;Intermittent    Aggravating Factors Spontaneous onset;Looking up to the ceiling;Sitting with head tilted back;Turning body quickly;Turning head quickly    Progression of Symptoms Better      Oculomotor Exam   Oculomotor Alignment Normal    Head shaking Horizontal Absent    Head Shaking Vertical Absent    Smooth Pursuits Intact    Saccades Intact      Vestibulo-Ocular Reflex   VOR 1 Head  Only (x 1 viewing) normal    VOR 2 Head and Object (x 2 viewing) normal    VOR to Slow Head Movement Normal                Objective measurements completed on examination: See above findings.                PT Education - 06/24/21 1022     Education Details education on VOR x 1-2    Person(s) Educated Patient    Methods Explanation;Handout    Comprehension Verbalized understanding              PT Short Term Goals - 06/24/21 1027       PT SHORT TERM GOAL #1   Title Patient will report no acute episodes of vertigo    Time 2    Period Weeks    Status New    Target Date 07/08/21      PT SHORT TERM GOAL #2   Title Patient will be independent with HEP in order to improve functional outcomes.    Time 2    Period Weeks    Status New    Target Date 07/08/21               PT Long Term Goals - 06/24/21 1027       PT LONG TERM GOAL #1   Title Patient will remain free of any acute vestibular incidents    Time 4    Period Weeks    Status New    Target Date 07/22/21                    Plan - 06/24/21 1023     Clinical Impression Statement 30 yo lady with improving vertigo symptoms since onset in May 2022.  Pt has been seen by other specialist for diagnostic and tx since acute episode.  Pt notes some lingering issues with symptoms when at work and exposed to excess noise and dim lighting.  Pt provided with education and trial/error techniques to self-assessment. Follow up visits indicated to progress vestibular habituation activities to reduce recurrence of syndrome    Personal Factors and Comorbidities Time since onset of injury/illness/exacerbation;Profession    Examination-Activity Limitations [place]    Examination-Participation Restrictions Occupation    Stability/Clinical Decision Making Stable/Uncomplicated    Clinical Decision Making Low    Rehab Potential Good    PT Frequency 1x / week  PT Duration 4 weeks    PT  Treatment/Interventions ADLs/Self Care Home Management;Moist Heat;Gait training;Therapeutic activities;Therapeutic exercise;Balance training;Manual techniques;Neuromuscular re-education    PT Next Visit Plan assess for VOR 1-2 and if any acute episodes, then D/C if no issue    PT Home Exercise Plan VOR 1-2    Consulted and Agree with Plan of Care Patient             Patient will benefit from skilled therapeutic intervention in order to improve the following deficits and impairments:  Impaired sensation, Dizziness  Visit Diagnosis: Dizziness and giddiness     Problem List Patient Active Problem List   Diagnosis Date Noted   Herniated nucleus pulposus, L5-S1, right 05/24/2021   Right hip pain 04/05/2021   Vaginal irritation 01/09/2021   Urinary frequency 01/09/2021   Spinal stenosis of lumbar region with neurogenic claudication 12/19/2020   Disc displacement, lumbar 11/12/2020   Essential (primary) hypertension 11/12/2020   ASCUS of cervix with negative high risk HPV 09/21/2020   Encounter for gynecological examination with Papanicolaou smear of cervix 09/18/2020   Sciatica 08/30/2020   Mild asthma without complication 49/82/6415   Tobacco abuse 07/03/2020   Migraines 01/05/2013    Toniann Fail, PT 06/24/2021, 10:30 AM  [facility] [address] [place], [place], [address] Phone: [phone]   Fax:  [phone]  Name: Kaylee Miller MRN: [id number] Date of Birth: 24-Jun-1991

## 2021-07-08 ENCOUNTER — Ambulatory Visit (HOSPITAL_COMMUNITY): Payer: Medicaid Other

## 2021-07-11 ENCOUNTER — Other Ambulatory Visit: Payer: Self-pay

## 2021-07-11 ENCOUNTER — Ambulatory Visit (INDEPENDENT_AMBULATORY_CARE_PROVIDER_SITE_OTHER): Payer: Medicaid Other | Admitting: Internal Medicine

## 2021-07-11 ENCOUNTER — Encounter: Payer: Self-pay | Admitting: Internal Medicine

## 2021-07-11 DIAGNOSIS — J111 Influenza due to unidentified influenza virus with other respiratory manifestations: Secondary | ICD-10-CM

## 2021-07-11 MED ORDER — NOREL AD 4-10-325 MG PO TABS: 1.0000 | ORAL_TABLET | Freq: Three times a day (TID) | ORAL | 0 refills | Status: DC | PRN

## 2021-07-11 MED ORDER — OSELTAMIVIR PHOSPHATE 75 MG PO CAPS: 75.0000 mg | ORAL_CAPSULE | Freq: Two times a day (BID) | ORAL | 0 refills | Status: DC

## 2021-07-11 NOTE — Progress Notes (Signed)
Virtual Visit via Telephone Note   This visit type was conducted due to national recommendations for restrictions regarding the COVID-19 Pandemic (e.g. social distancing) in an effort to limit this patient's exposure and mitigate transmission in our community.  Due to her co-morbid illnesses, this patient is at least at moderate risk for complications without adequate follow up.  This format is felt to be most appropriate for this patient at this time.  The patient did not have access to video technology/had technical difficulties with video requiring transitioning to audio format only (telephone).  All issues noted in this document were discussed and addressed.  No physical exam could be performed with this format.  Evaluation Performed:  Follow-up visit  Date:  07/11/2021   ID:  Kaylee Miller, DOB 1991-08-09, MRN [id number]  Patient Location: Home Provider Location: Office/Clinic  Participants: Patient Location of Patient: Home Location of Provider: Telehealth Consent was obtain for visit to be over via telehealth. I verified that I am speaking with the correct person using two identifiers.  PCP:  Lindell Spar, MD   Chief Complaint: Cough, nasal congestion and chills  History of Present Illness:    Kaylee Miller is a 30 y.o. female who has a televisit for complaint of cough, fatigue, chills and nasal congestion since yesterday. Her son tested positive for flu 3 days ago.  She has not had flu vaccine yet.  Denies any wheezing, chest pain or palpitations currently.  She has tried OTC Mucinex DM with minimal relief.  The patient does not have symptoms concerning for COVID-19 infection (fever, chills, cough, or new shortness of breath).   Past Medical, Surgical, Social History, Allergies, and Medications have been Reviewed.  Past Medical History:  Diagnosis Date   Asthma    Phreesia 07/02/2020   Contraceptive management 02/08/2015   Headache(784.0)    migraines    History of bronchitis    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    S/P C-section 07/28/2013   Sciatic nerve pain    Seasonal allergies    Past Surgical History:  Procedure Laterality Date   adenoids     CESAREAN SECTION  2007   CESAREAN SECTION N/A 07/28/2013   Procedure: CESAREAN SECTION;  Surgeon: Jonnie Kind, MD;  Location: [facility];  Service: Obstetrics;  Laterality: N/A;   LUMBAR LAMINECTOMY/DECOMPRESSION MICRODISCECTOMY Right 05/25/2021   Procedure: DISCECTOMY Lumbar five - Sacral one;  Surgeon: Ashok Pall, MD;  Location: [facility];  Service: Neurosurgery;  Laterality: Right;   tubes in ears       Current Meds  Medication Sig   albuterol (VENTOLIN HFA) 108 (90 Base) MCG/ACT inhaler Inhale 2 puffs into the lungs every 6 (six) hours as needed for wheezing or shortness of breath.   aspirin-acetaminophen-caffeine (EXCEDRIN MIGRAINE) 250-250-65 MG tablet Take 1 tablet by mouth every 6 (six) hours as needed for migraine.   docusate sodium (COLACE) 100 MG capsule Take 100 mg by mouth 2 (two) times daily.   fluticasone (FLONASE) 50 MCG/ACT nasal spray Place 2 sprays into both nostrils daily as needed for allergies or rhinitis.   HYDROcodone-acetaminophen (NORCO) 5-325 MG tablet Take 1 tablet by mouth every 4 (four) hours as needed for severe pain.   ibuprofen (ADVIL) 200 MG tablet Take 600 mg by mouth every 6 (six) hours as needed (For cramps).   Magnesium Salicylate Tetrahyd (DOANS EXTRA STRENGTH) 580 MG TABS Take 1 tablet by mouth daily as needed (  For back pain).   meclizine (ANTIVERT) 25 MG tablet Take 1 tablet (25 mg total) by mouth 3 (three) times daily as needed for dizziness.   medroxyPROGESTERone Acetate 150 MG/ML SUSY INJECT 1 ML INTO THE MUSCLE EVERY 3 MONTHS (Patient taking differently: Inject 1 mL into the skin every 3 (three) months.)   ondansetron (ZOFRAN ODT) 4 MG disintegrating tablet Take 1 tablet (4 mg total) by mouth every 8 (eight) hours as needed for nausea  or vomiting.   oxyCODONE (OXY IR/ROXICODONE) 5 MG immediate release tablet Take 5 mg by mouth every 6 (six) hours as needed for pain.   SUMAtriptan (IMITREX) 50 MG tablet Take 50 mg by mouth every 2 (two) hours as needed for migraine.   tiZANidine (ZANAFLEX) 4 MG tablet Take 1 tablet (4 mg total) by mouth every 6 (six) hours as needed for muscle spasms.   Vitamin D, Ergocalciferol, (DRISDOL) 1.25 MG (50000 UNIT) CAPS capsule Take 1 capsule (50,000 Units total) by mouth every 7 (seven) days.     Allergies:   Latex   ROS:   Please see the history of present illness.     All other systems reviewed and are negative.   Labs/Other Tests and Data Reviewed:    Recent Labs: 05/20/2021: Magnesium 2.0 05/24/2021: BUN 15; Creatinine, Ser 0.81; Hemoglobin 13.1; Platelets 196; Potassium 3.3; Sodium 138   Recent Lipid Panel Lab Results  Component Value Date/Time   CHOL 223 (H) 07/03/2020 10:58 AM   TRIG 79 07/03/2020 10:58 AM   HDL 51 07/03/2020 10:58 AM   CHOLHDL 4.4 07/03/2020 10:58 AM   LDLCALC 158 (H) 07/03/2020 10:58 AM    Wt Readings from Last 3 Encounters:  05/25/21 191 lb 12.8 oz (87 kg)  05/19/21 191 lb 12.8 oz (87 kg)  05/02/21 192 lb (87.1 kg)     ASSESSMENT & PLAN:    Influenza infection Likely as she has close contact with flu positive test result Started Tamiflu Norel for nasal congestion Mucinex DM for cough Advised to contact if she has any new symptoms or persistent or worsening symptoms  Time:   Today, I have spent 9 minutes reviewing the chart, including problem list, medications, and with the patient with telehealth technology discussing the above problems.   Medication Adjustments/Labs and Tests Ordered: Current medicines are reviewed at length with the patient today.  Concerns regarding medicines are outlined above.   Tests Ordered: No orders of the defined types were placed in this encounter.   Medication Changes: No orders of the defined types were  placed in this encounter.    Note: This dictation was prepared with Dragon dictation along with smaller phrase technology. Similar sounding words can be transcribed inadequately or may not be corrected upon review. Any transcriptional errors that result from this process are unintentional.      Disposition:  Follow up  Signed, Lindell Spar, MD  07/11/2021 4:36 PM     [facility]

## 2021-07-13 ENCOUNTER — Other Ambulatory Visit: Payer: Self-pay

## 2021-07-13 ENCOUNTER — Emergency Department (HOSPITAL_COMMUNITY)
Admission: EM | Admit: 2021-07-13 | Discharge: 2021-07-13 | Discharge status: Against Medical Advice | Payer: Medicaid Other | Source: Home / Self Care

## 2021-07-13 DIAGNOSIS — R519 Headache, unspecified: Secondary | ICD-10-CM | POA: Diagnosis not present

## 2021-07-13 DIAGNOSIS — R110 Nausea: Secondary | ICD-10-CM | POA: Diagnosis not present

## 2021-07-15 ENCOUNTER — Telehealth: Payer: Self-pay

## 2021-07-15 NOTE — Telephone Encounter (Signed)
Transition Care Management Follow-up Telephone Call Date of discharge and from where: 07/13/2021 from [facility] How have you been since you were released from the hospital? Pt stated that she is feeling much better and did not have any questions or concerns at this time.  Any questions or concerns? No  Items Reviewed: Did the pt receive and understand the discharge instructions provided? Yes  Medications obtained and verified? Yes  Other? No  Any new allergies since your discharge? No  Dietary orders reviewed? No Do you have support at home? Yes   Functional Questionnaire: (I = Independent and D = Dependent) ADLs: I  Bathing/Dressing- I  Meal Prep- I  Eating- I  Maintaining continence- I  Transferring/Ambulation- I  Managing Meds- I  Follow up appointments reviewed:  PCP Hospital f/u appt confirmed? No   Specialist Hospital f/u appt confirmed? No   Are transportation arrangements needed? No  If their condition worsens, is the pt aware to call PCP or go to the Emergency Dept.? Yes Was the patient provided with contact information for the PCP's office or ED? Yes Was to pt encouraged to call back with questions or concerns? Yes

## 2021-07-22 ENCOUNTER — Ambulatory Visit (HOSPITAL_COMMUNITY): Payer: Medicaid Other | Attending: Otolaryngology

## 2021-07-22 ENCOUNTER — Other Ambulatory Visit: Payer: Self-pay

## 2021-07-22 DIAGNOSIS — R42 Dizziness and giddiness: Secondary | ICD-10-CM | POA: Insufficient documentation

## 2021-07-22 NOTE — Therapy (Signed)
[place], [place], [address] Phone: [phone]   Fax:  [phone]  Physical Therapy Treatment and Discharge Summary  Patient Details  Name: Kaylee Miller MRN: [id number] Date of Birth: 1991-03-24 Referring Provider (PT): Leta Baptist MD  PHYSICAL THERAPY DISCHARGE SUMMARY  Visits from Start of Care: 2  Current functional level related to goals / functional outcomes: See below   Remaining deficits: None   Education / Equipment: None needed   Patient agrees to discharge. Patient goals were met. Patient is being discharged due to meeting the stated rehab goals.  Encounter Date: 07/22/2021   PT End of Session - 07/22/21 7915     Visit Number 2    Number of Visits 3    Date for PT Re-Evaluation 07/22/21    Authorization Type [place] Medicaid [employer]- auth required, sheet filled out on eval    PT Start Time 0820    PT Stop Time 0840   D/C assessment   PT Time Calculation (min) 20 min    Activity Tolerance Patient tolerated treatment well    Behavior During Therapy [facility] for tasks assessed/performed             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    S/P C-section 07/28/2013   Sciatic nerve pain    Seasonal allergies     Past Surgical History:  Procedure Laterality Date   adenoids     CESAREAN SECTION  2007   CESAREAN SECTION N/A 07/28/2013   Procedure: CESAREAN SECTION;  Surgeon: Jonnie Kind, MD;  Location: [facility];  Service: Obstetrics;  Laterality: N/A;   LUMBAR LAMINECTOMY/DECOMPRESSION MICRODISCECTOMY Right 05/25/2021   Procedure: DISCECTOMY Lumbar five - Sacral one;  Surgeon: Ashok Pall, MD;  Location: [facility];  Service: Neurosurgery;  Laterality: Right;   tubes in ears      There were no vitals filed  for this visit.   Subjective Assessment - 07/22/21 0823     Subjective Pt notes no vertigo episodes, recently had the flu which caused some feeling of dizziness/lightheaded but these symptoms have improved since illness has resolved. Pt notes she has no longer had to use medication and her symptoms have not reappeared    Currently in Pain? No/denies    Pain Score 0-No pain                [facility] PT Assessment - 07/22/21 0001       Assessment   Medical Diagnosis dizziness    Referring Provider (PT) Leta Baptist MD    Onset Date/Surgical Date 12/23/20                                    PT Education - 07/22/21 0830     Education Details Discussion regarding course of BPPV and symptoms, or lack thereof.  Pt encouraged to continue VOR activities for training. Will D/C at this time    Person(s) Educated Patient    Methods Explanation    Comprehension Verbalized understanding              PT Short Term Goals - 07/22/21 0569       PT  SHORT TERM GOAL #1   Title Patient will report no acute episodes of vertigo    Baseline No episodes    Time 2    Period Weeks    Status Achieved    Target Date 07/08/21      PT SHORT TERM GOAL #2   Title Patient will be independent with HEP in order to improve functional outcomes.    Time 2    Period Weeks    Status Achieved    Target Date 07/08/21               PT Long Term Goals - 07/22/21 0833       PT LONG TERM GOAL #1   Title Patient will remain free of any acute vestibular incidents    Time 4    Period Weeks    Status Achieved                   Plan - 07/22/21 0831     Clinical Impression Statement Pt notes near resolution of her symptoms and has not had any acute vertiginous episodes and no longer requires use of medication.  Pt notes no movements seem to provoke symptoms at this time.  Pt will D/C from PT services at this time.    Personal Factors and Comorbidities Time since onset  of injury/illness/exacerbation;Profession    Examination-Activity Limitations [place]    Examination-Participation Restrictions Occupation    Stability/Clinical Decision Making Stable/Uncomplicated    Rehab Potential Good    PT Frequency 1x / week    PT Duration 4 weeks    PT Treatment/Interventions ADLs/Self Care Home Management;Moist Heat;Gait training;Therapeutic activities;Therapeutic exercise;Balance training;Manual techniques;Neuromuscular re-education    PT Next Visit Plan assess for VOR 1-2 and if any acute episodes, then D/C if no issue    PT Home Exercise Plan VOR 1-2    Consulted and Agree with Plan of Care Patient             Patient will benefit from skilled therapeutic intervention in order to improve the following deficits and impairments:  Impaired sensation, Dizziness  Visit Diagnosis: Dizziness and giddiness     Problem List Patient Active Problem List   Diagnosis Date Noted   Herniated nucleus pulposus, L5-S1, right 05/24/2021   Right hip pain 04/05/2021   Vaginal irritation 01/09/2021   Urinary frequency 01/09/2021   Spinal stenosis of lumbar region with neurogenic claudication 12/19/2020   Disc displacement, lumbar 11/12/2020   Essential (primary) hypertension 11/12/2020   ASCUS of cervix with negative high risk HPV 09/21/2020   Encounter for gynecological examination with Papanicolaou smear of cervix 09/18/2020   Sciatica 08/30/2020   Mild asthma without complication 17/79/3903   Tobacco abuse 07/03/2020   Migraines 01/05/2013    Toniann Fail, PT 07/22/2021, 8:34 AM  [facility] [address] [place], [place], [address] Phone: [phone]   Fax:  [phone]  Name: LORRENE GRAEF MRN: [id number] Date of Birth: 18-Sep-1990

## 2021-08-07 ENCOUNTER — Encounter: Payer: Self-pay | Admitting: Gastroenterology

## 2021-08-07 ENCOUNTER — Ambulatory Visit: Payer: Medicaid Other | Admitting: Gastroenterology

## 2021-08-19 ENCOUNTER — Other Ambulatory Visit: Payer: Self-pay | Admitting: Adult Health

## 2021-08-20 ENCOUNTER — Ambulatory Visit: Payer: Medicaid Other | Admitting: *Deleted

## 2021-08-20 ENCOUNTER — Other Ambulatory Visit: Payer: Self-pay

## 2021-08-20 ENCOUNTER — Other Ambulatory Visit: Payer: Self-pay | Admitting: Women's Health

## 2021-08-20 ENCOUNTER — Ambulatory Visit: Payer: Medicaid Other

## 2021-08-20 ENCOUNTER — Telehealth: Payer: Self-pay | Admitting: *Deleted

## 2021-08-20 DIAGNOSIS — Z3042 Encounter for surveillance of injectable contraceptive: Secondary | ICD-10-CM | POA: Diagnosis not present

## 2021-08-20 MED ORDER — MEDROXYPROGESTERONE ACETATE 150 MG/ML IM SUSP
150.0000 mg | Freq: Once | INTRAMUSCULAR | Status: AC
  Administered 2021-08-20: 17:00:00 150 mg via INTRAMUSCULAR

## 2021-08-20 MED ORDER — MEDROXYPROGESTERONE ACETATE 150 MG/ML IM SUSY: 150.0000 mg | PREFILLED_SYRINGE | INTRAMUSCULAR | 0 refills | Status: DC

## 2021-08-20 NOTE — Telephone Encounter (Signed)
Called requesting refill on Depo. Scheduled to come this afternoon for injection.

## 2021-08-20 NOTE — Progress Notes (Signed)
° °  NURSE VISIT- INJECTION  SUBJECTIVE:  Kaylee Miller is a 30 y.o. [id number] female here for a Depo Provera for contraception/period management. She is a GYN patient.   OBJECTIVE:  There were no vitals taken for this visit.  Appears well, in no apparent distress  Injection administered in: Left upper quad. gluteus  Meds ordered this encounter  Medications   medroxyPROGESTERone (DEPO-PROVERA) injection 150 mg    ASSESSMENT: GYN patient Depo Provera for contraception/period management PLAN: Follow-up: in 11-13 weeks for next Depo   [name]  08/20/2021 4:35 PM

## 2021-08-21 ENCOUNTER — Encounter: Payer: Self-pay | Admitting: Internal Medicine

## 2021-08-21 ENCOUNTER — Ambulatory Visit (INDEPENDENT_AMBULATORY_CARE_PROVIDER_SITE_OTHER): Payer: Medicaid Other | Admitting: Internal Medicine

## 2021-08-21 DIAGNOSIS — J209 Acute bronchitis, unspecified: Secondary | ICD-10-CM | POA: Diagnosis not present

## 2021-08-21 MED ORDER — MEDROXYPROGESTERONE ACETATE 150 MG/ML IM SUSY: 150.0000 mg | PREFILLED_SYRINGE | INTRAMUSCULAR | 4 refills | Status: DC

## 2021-08-21 MED ORDER — AMOXICILLIN-POT CLAVULANATE 875-125 MG PO TABS: 1.0000 | ORAL_TABLET | Freq: Two times a day (BID) | ORAL | 0 refills | Status: DC

## 2021-08-21 NOTE — Progress Notes (Signed)
Virtual Visit via Telephone Note   This visit type was conducted due to national recommendations for restrictions regarding the COVID-19 Pandemic (e.g. social distancing) in an effort to limit this patient's exposure and mitigate transmission in our community.  Due to her co-morbid illnesses, this patient is at least at moderate risk for complications without adequate follow up.  This format is felt to be most appropriate for this patient at this time.  The patient did not have access to video technology/had technical difficulties with video requiring transitioning to audio format only (telephone).  All issues noted in this document were discussed and addressed.  No physical exam could be performed with this format.  Evaluation Performed:  Follow-up visit  Date:  08/21/2021   ID:  Kaylee Miller, Kaylee Miller Jan 25, 1991, MRN [id number]  Patient Location: Home Provider Location: Office/Clinic  Participants: Patient Location of Patient: Home Location of Provider: Telehealth Consent was obtain for visit to be over via telehealth. I verified that I am speaking with the correct person using two identifiers.  PCP:  Lindell Spar, MD   Chief Complaint: Cough, wheezing, sore throat  History of Present Illness:    Kaylee Miller is a 30 y.o. female who has a televisit for complaint of cough with greenish-yellow expectoration, sore throat and sinus pressure related headache for the last 10 days.  She has tried taking Mucinex DM and DayQuil/NyQuil with minimal relief.  She also reports mild wheezing, for which she has been using albuterol inhaler as needed.  She denies any chest pain or palpitations.  Her symptoms are worse at nighttime and in the morning.  The patient does not have symptoms concerning for COVID-19 infection (fever, chills, cough, or new shortness of breath).   Past Medical, Surgical, Social History, Allergies, and Medications have been Reviewed.  Past Medical History:   Diagnosis Date   Asthma    Phreesia 07/02/2020   Contraceptive management 02/08/2015   Headache(784.0)    migraines   History of bronchitis    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    S/P C-section 07/28/2013   Sciatic nerve pain    Seasonal allergies    Past Surgical History:  Procedure Laterality Date   adenoids     CESAREAN SECTION  2007   CESAREAN SECTION N/A 07/28/2013   Procedure: CESAREAN SECTION;  Surgeon: Jonnie Kind, MD;  Location: [facility];  Service: Obstetrics;  Laterality: N/A;   LUMBAR LAMINECTOMY/DECOMPRESSION MICRODISCECTOMY Right 05/25/2021   Procedure: DISCECTOMY Lumbar five - Sacral one;  Surgeon: Ashok Pall, MD;  Location: [facility];  Service: Neurosurgery;  Laterality: Right;   tubes in ears       Current Meds  Medication Sig   albuterol (VENTOLIN HFA) 108 (90 Base) MCG/ACT inhaler Inhale 2 puffs into the lungs every 6 (six) hours as needed for wheezing or shortness of breath.   aspirin-acetaminophen-caffeine (EXCEDRIN MIGRAINE) 250-250-65 MG tablet Take 1 tablet by mouth every 6 (six) hours as needed for migraine.   Chlorphen-PE-Acetaminophen (NOREL AD) 4-10-325 MG TABS Take 1 tablet by mouth 3 (three) times daily as needed.   docusate sodium (COLACE) 100 MG capsule Take 100 mg by mouth 2 (two) times daily.   fluticasone (FLONASE) 50 MCG/ACT nasal spray Place 2 sprays into both nostrils daily as needed for allergies or rhinitis.   ibuprofen (ADVIL) 200 MG tablet Take 600 mg by mouth every 6 (six) hours as needed (For cramps).   Magnesium  Salicylate Tetrahyd (DOANS EXTRA STRENGTH) 580 MG TABS Take 1 tablet by mouth daily as needed (For back pain).   meclizine (ANTIVERT) 25 MG tablet Take 1 tablet (25 mg total) by mouth 3 (three) times daily as needed for dizziness.   medroxyPROGESTERone Acetate 150 MG/ML SUSY Inject 1 mL (150 mg total) into the muscle every 3 (three) months. INJECT 1 ML INTO THE MUSCLE EVERY 3 MONTHS Strength: 150 mg/mL    ondansetron (ZOFRAN ODT) 4 MG disintegrating tablet Take 1 tablet (4 mg total) by mouth every 8 (eight) hours as needed for nausea or vomiting.   SUMAtriptan (IMITREX) 50 MG tablet Take 50 mg by mouth every 2 (two) hours as needed for migraine.   tiZANidine (ZANAFLEX) 4 MG tablet Take 1 tablet (4 mg total) by mouth every 6 (six) hours as needed for muscle spasms.   Vitamin D, Ergocalciferol, (DRISDOL) 1.25 MG (50000 UNIT) CAPS capsule Take 1 capsule (50,000 Units total) by mouth every 7 (seven) days.     Allergies:   Latex   ROS:   Please see the history of present illness.     All other systems reviewed and are negative.   Labs/Other Tests and Data Reviewed:    Recent Labs: 05/20/2021: Magnesium 2.0 05/24/2021: BUN 15; Creatinine, Ser 0.81; Hemoglobin 13.1; Platelets 196; Potassium 3.3; Sodium 138   Recent Lipid Panel Lab Results  Component Value Date/Time   CHOL 223 (H) 07/03/2020 10:58 AM   TRIG 79 07/03/2020 10:58 AM   HDL 51 07/03/2020 10:58 AM   CHOLHDL 4.4 07/03/2020 10:58 AM   LDLCALC 158 (H) 07/03/2020 10:58 AM    Wt Readings from Last 3 Encounters:  05/25/21 191 lb 12.8 oz (87 kg)  05/19/21 191 lb 12.8 oz (87 kg)  05/02/21 192 lb (87.1 kg)     ASSESSMENT & PLAN:    Acute bronchitis Started Augmentin since her symptoms are persistent with symptomatic treatment Continue Mucinex DM as needed Advised to use nasal saline spray as needed for nasal congestion Advised to use humidifier at nighttime to avoid dry air  Time:   Today, I have spent 9 minutes reviewing the chart, including problem list, medications, and with the patient with telehealth technology discussing the above problems.   Medication Adjustments/Labs and Tests Ordered: Current medicines are reviewed at length with the patient today.  Concerns regarding medicines are outlined above.   Tests Ordered: No orders of the defined types were placed in this encounter.   Medication Changes: No orders of  the defined types were placed in this encounter.    Note: This dictation was prepared with Dragon dictation along with smaller phrase technology. Similar sounding words can be transcribed inadequately or may not be corrected upon review. Any transcriptional errors that result from this process are unintentional.      Disposition:  Follow up  Signed, Lindell Spar, MD  08/21/2021 2:38 PM     [place]

## 2021-08-22 ENCOUNTER — Telehealth: Payer: Medicaid Other | Admitting: Internal Medicine

## 2021-08-27 DIAGNOSIS — M5126 Other intervertebral disc displacement, lumbar region: Secondary | ICD-10-CM | POA: Diagnosis not present

## 2021-08-27 DIAGNOSIS — Z6830 Body mass index (BMI) 30.0-30.9, adult: Secondary | ICD-10-CM | POA: Diagnosis not present

## 2021-08-27 DIAGNOSIS — R030 Elevated blood-pressure reading, without diagnosis of hypertension: Secondary | ICD-10-CM | POA: Diagnosis not present

## 2021-09-13 ENCOUNTER — Ambulatory Visit: Payer: Medicaid Other | Admitting: Internal Medicine

## 2021-09-13 ENCOUNTER — Encounter: Payer: Self-pay | Admitting: Internal Medicine

## 2021-09-13 ENCOUNTER — Other Ambulatory Visit: Payer: Self-pay

## 2021-09-13 VITALS — BP 119/83 | HR 91 | Ht 67.0 in | Wt 196.0 lb

## 2021-09-13 DIAGNOSIS — M48062 Spinal stenosis, lumbar region with neurogenic claudication: Secondary | ICD-10-CM

## 2021-09-13 DIAGNOSIS — Z23 Encounter for immunization: Secondary | ICD-10-CM

## 2021-09-13 DIAGNOSIS — M5126 Other intervertebral disc displacement, lumbar region: Secondary | ICD-10-CM

## 2021-09-13 DIAGNOSIS — M7989 Other specified soft tissue disorders: Secondary | ICD-10-CM | POA: Diagnosis not present

## 2021-09-13 MED ORDER — METHYLPREDNISOLONE ACETATE 80 MG/ML IJ SUSP
80.0000 mg | Freq: Once | INTRAMUSCULAR | Status: AC
  Administered 2021-09-13: 80 mg via INTRAMUSCULAR

## 2021-09-13 MED ORDER — TIZANIDINE HCL 4 MG PO TABS: 4.0000 mg | ORAL_TABLET | Freq: Four times a day (QID) | ORAL | 1 refills | Status: DC | PRN

## 2021-09-13 MED ORDER — KETOROLAC TROMETHAMINE 60 MG/2ML IM SOLN
60.0000 mg | Freq: Once | INTRAMUSCULAR | Status: AC
  Administered 2021-09-13: 60 mg via INTRAMUSCULAR

## 2021-09-13 MED ORDER — PREDNISONE 10 MG (21) PO TBPK: ORAL_TABLET | ORAL | 0 refills | Status: DC

## 2021-09-13 NOTE — Assessment & Plan Note (Signed)
MRI of the lumbar spine reviewed Follows with Spine specialist Physical therapy discussed - will discuss with Spine surgeon Avoid heavy lifting and long shifts > 8 hours Depo-Medrol and Toradol given in the office, followed by Sterapred taper

## 2021-09-13 NOTE — Progress Notes (Signed)
Acute Office Visit  Subjective:    Patient ID: [name], female    DOB: 09/30/1990, 31 y.o.   MRN: [id number]  Chief Complaint  Patient presents with   Follow-up    L leg pain,both feet and leg swelling, feet will turn blue stay cold.    HPI Patient is in today for c/o left leg pain, radiating from back, which is constant, worse with movement and at the end of the day.  She has had lumbar discectomy in 10/22, after which her back pain has improved and her right leg numbness has resolved now.  She now has complaint of left leg pain with intermittent weakness.  She denies any urinary or stool incontinence. she was advised to avoid heavy lifting more than 30 pounds by spine surgeon, but she states that she still has to lift heavy weight at work.  She also complains of bilateral leg swelling, worse at the end of her workday.  She also mentions having cold sensation in her feet.  She denies any recent injury or fall.  She reports having bilateral leg weakness, which is chronic.  She received flu vaccine in the office today.  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    S/P C-section 07/28/2013   Sciatic nerve pain    Seasonal allergies     Past Surgical History:  Procedure Laterality Date   adenoids     CESAREAN SECTION  2007   CESAREAN SECTION N/A 07/28/2013   Procedure: CESAREAN SECTION;  Surgeon: Jonnie Kind, MD;  Location: [facility];  Service: Obstetrics;  Laterality: N/A;   LUMBAR LAMINECTOMY/DECOMPRESSION MICRODISCECTOMY Right 05/25/2021   Procedure: DISCECTOMY Lumbar five - Sacral one;  Surgeon: Ashok Pall, MD;  Location: [facility];  Service: Neurosurgery;  Laterality: Right;   tubes in ears      Family History  Problem Relation Age of Onset   Cancer Mother    Breast cancer Mother     Hypertension Father    Stroke Father    CAD Maternal Grandfather    CAD Paternal Grandmother    CAD Paternal Grandfather    Diabetes Other        great-great grandmother   CAD Other     Social History   Socioeconomic History   Marital status: Single    Spouse name: Not on file   Number of children: Not on file   Years of education: Not on file   Highest education level: Not on file  Occupational History   Not on file  Tobacco Use   Smoking status: Some Days    Packs/day: 1.00    Years: 9.00    Pack years: 9.00    Types: Cigarettes   Smokeless tobacco: Never  Vaping Use   Vaping Use: Never used  Substance and Sexual Activity   Alcohol use: Yes    Alcohol/week: 0.0 standard drinks   Drug use: Never   Sexual activity: Not Currently    Birth control/protection: Injection  Other Topics Concern   Not on file  Social History Narrative   Not on file   Social Determinants of Health   Financial Resource Strain: Medium Risk   Difficulty of Paying Living Expenses: Somewhat hard  Food Insecurity: Food Insecurity Present  Worried About Charity fundraiser in the Last Year: Sometimes true   [employer] of Food in the Last Year: Never true  Transportation Needs: No Transportation Needs   Lack of Transportation (Medical): No   Lack of Transportation (Non-Medical): No  Physical Activity: Sufficiently Active   Days of Exercise per Week: 5 days   Minutes of Exercise per Session: 100 min  Stress: No Stress Concern Present   Feeling of Stress : Only a little  Social Connections: Moderately Integrated   Frequency of Communication with Friends and Family: More than three times a week   Frequency of Social Gatherings with Friends and Family: Twice a week   Attends Religious Services: More than 4 times per year   Active Member of [employer] or Organizations: Yes   Attends Archivist Meetings: 1 to 4 times per year   Marital Status: Never married  Human resources officer Violence:  Unknown   Fear of Current or Ex-Partner: No   Emotionally Abused: Patient refused   Physically Abused: No   Sexually Abused: No    Outpatient Medications Prior to Visit  Medication Sig Dispense Refill   albuterol (VENTOLIN HFA) 108 (90 Base) MCG/ACT inhaler Inhale 2 puffs into the lungs every 6 (six) hours as needed for wheezing or shortness of breath. 6.7 g 0   aspirin-acetaminophen-caffeine (EXCEDRIN MIGRAINE) 250-250-65 MG tablet Take 1 tablet by mouth every 6 (six) hours as needed for migraine.     docusate sodium (COLACE) 100 MG capsule Take 100 mg by mouth 2 (two) times daily.     fluticasone (FLONASE) 50 MCG/ACT nasal spray Place 2 sprays into both nostrils daily as needed for allergies or rhinitis.     ibuprofen (ADVIL) 200 MG tablet Take 600 mg by mouth every 6 (six) hours as needed (For cramps).     Magnesium Salicylate Tetrahyd (DOANS EXTRA STRENGTH) 580 MG TABS Take 1 tablet by mouth daily as needed (For back pain).     meclizine (ANTIVERT) 25 MG tablet Take 1 tablet (25 mg total) by mouth 3 (three) times daily as needed for dizziness. 30 tablet 0   medroxyPROGESTERone Acetate 150 MG/ML SUSY Inject 1 mL (150 mg total) into the muscle every 3 (three) months. INJECT 1 ML INTO THE MUSCLE EVERY 3 MONTHS Strength: 150 mg/mL 1 mL 4   ondansetron (ZOFRAN ODT) 4 MG disintegrating tablet Take 1 tablet (4 mg total) by mouth every 8 (eight) hours as needed for nausea or vomiting. 20 tablet 0   SUMAtriptan (IMITREX) 50 MG tablet Take 50 mg by mouth every 2 (two) hours as needed for migraine.     Vitamin D, Ergocalciferol, (DRISDOL) 1.25 MG (50000 UNIT) CAPS capsule Take 1 capsule (50,000 Units total) by mouth every 7 (seven) days. 5 capsule 5   amoxicillin-clavulanate (AUGMENTIN) 875-125 MG tablet Take 1 tablet by mouth 2 (two) times daily. 14 tablet 0   Chlorphen-PE-Acetaminophen (NOREL AD) 4-10-325 MG TABS Take 1 tablet by mouth 3 (three) times daily as needed. 30 tablet 0   tiZANidine  (ZANAFLEX) 4 MG tablet Take 1 tablet (4 mg total) by mouth every 6 (six) hours as needed for muscle spasms. 30 tablet 1   No facility-administered medications prior to visit.    Allergies  Allergen Reactions   Latex Swelling    Review of Systems  Constitutional:  Negative for chills and fever.  HENT:  Negative for congestion, sinus pressure, sinus pain and sore throat.   Eyes:  Negative for  pain and discharge.  Respiratory:  Negative for cough and shortness of breath.   Cardiovascular:  Positive for leg swelling. Negative for chest pain and palpitations.  Gastrointestinal:  Positive for constipation. Negative for abdominal pain, diarrhea, nausea and vomiting.  Endocrine: Negative for polydipsia and polyuria.  Genitourinary:  Negative for dysuria and hematuria.  Musculoskeletal:  Positive for back pain. Negative for neck pain and neck stiffness.       Left leg pain  Skin:  Negative for rash.  Neurological:  Positive for numbness (LE). Negative for dizziness, speech difficulty, weakness and headaches.  Psychiatric/Behavioral:  Negative for agitation and behavioral problems.       Objective:    Physical Exam Vitals reviewed.  Constitutional:      General: She is not in acute distress.    Appearance: She is not diaphoretic.  HENT:     Head: Normocephalic and atraumatic.     Nose: Nose normal. No congestion.     Mouth/Throat:     Mouth: Mucous membranes are moist.     Pharynx: No posterior oropharyngeal erythema.  Eyes:     General: No scleral icterus.    Extraocular Movements: Extraocular movements intact.  Cardiovascular:     Rate and Rhythm: Normal rate and regular rhythm.     Pulses: Normal pulses.     Heart sounds: Normal heart sounds. No murmur heard. Pulmonary:     Breath sounds: Normal breath sounds. No wheezing or rales.  Musculoskeletal:     Cervical back: Neck supple. No tenderness.     Right lower leg: No edema.     Left lower leg: No edema.     Comments:  ROM of the lumbar spine limited due to pain, no point tenderness in the lumbar area  Skin:    General: Skin is warm.     Findings: No rash.  Neurological:     General: No focal deficit present.     Mental Status: She is alert and oriented to person, place, and time.  Psychiatric:        Mood and Affect: Mood normal.        Behavior: Behavior normal.    BP 119/83    Pulse 91    Ht 5\' 7"  (1.702 m)    Wt 196 lb (88.9 kg)    SpO2 97%    BMI 30.70 kg/m  Wt Readings from Last 3 Encounters:  09/13/21 196 lb (88.9 kg)  05/25/21 191 lb 12.8 oz (87 kg)  05/19/21 191 lb 12.8 oz (87 kg)        Assessment & Plan:   Problem List Items Addressed This Visit       Musculoskeletal and Integument   Disc displacement, lumbar - Primary    MRI of the lumbar spine reviewed - s/p lumbar laminecomy with microdiscectomy in 10/22 Work restrictions discussed Advised to contact Spine surgeon for back pain and new onset left leg pain and weakness      Relevant Medications   predniSONE (STERAPRED UNI-PAK 21 TAB) 10 MG (21) TBPK tablet   tiZANidine (ZANAFLEX) 4 MG tablet     Other   Spinal stenosis of lumbar region with neurogenic claudication    MRI of the lumbar spine reviewed Follows with Spine specialist Physical therapy discussed - will discuss with Spine surgeon Avoid heavy lifting and long shifts > 8 hours Depo-Medrol and Toradol given in the office, followed by Sterapred taper      Relevant Medications   predniSONE (  STERAPRED UNI-PAK 21 TAB) 10 MG (21) TBPK tablet   tiZANidine (ZANAFLEX) 4 MG tablet   Leg swelling    Likely due to venous insufficiency, pulse intact Advised leg elevation and compression socks Low salt diet advised      Other Visit Diagnoses     Need for immunization against influenza       Relevant Orders   Flu Vaccine QUAD 83mo+IM (Fluarix, Fluzone & Alfiuria Quad PF) (Completed)        Meds ordered this encounter  Medications   predniSONE (STERAPRED  UNI-PAK 21 TAB) 10 MG (21) TBPK tablet    Sig: Take as package instructions.    Dispense:  1 each    Refill:  0   tiZANidine (ZANAFLEX) 4 MG tablet    Sig: Take 1 tablet (4 mg total) by mouth every 6 (six) hours as needed for muscle spasms.    Dispense:  30 tablet    Refill:  1   methylPREDNISolone acetate (DEPO-MEDROL) injection 80 mg   ketorolac (TORADOL) injection 60 mg     Lindell Spar, MD

## 2021-09-13 NOTE — Patient Instructions (Signed)
Please take Prednisone as prescribed.  Please take Tizanidine as needed for muscle spasms.

## 2021-09-13 NOTE — Assessment & Plan Note (Signed)
MRI of the lumbar spine reviewed - s/p lumbar laminecomy with microdiscectomy in 10/22 Work restrictions discussed Advised to contact Spine surgeon for back pain and new onset left leg pain and weakness

## 2021-09-13 NOTE — Assessment & Plan Note (Signed)
Likely due to venous insufficiency, pulse intact Advised leg elevation and compression socks Low salt diet advised

## 2021-10-28 ENCOUNTER — Other Ambulatory Visit: Payer: Medicaid Other | Admitting: Adult Health

## 2021-10-29 ENCOUNTER — Encounter: Payer: Self-pay | Admitting: Adult Health

## 2021-10-29 ENCOUNTER — Other Ambulatory Visit: Payer: Self-pay

## 2021-10-29 ENCOUNTER — Other Ambulatory Visit (HOSPITAL_COMMUNITY)
Admission: RE | Admit: 2021-10-29 | Discharge: 2021-10-29 | Disposition: A | Discharge status: Home / Self Care | Payer: Medicaid Other | Source: Ambulatory Visit | Attending: Adult Health | Admitting: Adult Health

## 2021-10-29 ENCOUNTER — Ambulatory Visit (INDEPENDENT_AMBULATORY_CARE_PROVIDER_SITE_OTHER): Payer: Medicaid Other | Admitting: Adult Health

## 2021-10-29 VITALS — BP 121/71 | HR 82 | Ht 67.0 in | Wt 198.8 lb

## 2021-10-29 DIAGNOSIS — Z3042 Encounter for surveillance of injectable contraceptive: Secondary | ICD-10-CM

## 2021-10-29 DIAGNOSIS — F419 Anxiety disorder, unspecified: Secondary | ICD-10-CM | POA: Diagnosis not present

## 2021-10-29 DIAGNOSIS — Z01419 Encounter for gynecological examination (general) (routine) without abnormal findings: Secondary | ICD-10-CM | POA: Diagnosis not present

## 2021-10-29 DIAGNOSIS — F32A Depression, unspecified: Secondary | ICD-10-CM | POA: Diagnosis not present

## 2021-10-29 DIAGNOSIS — Z113 Encounter for screening for infections with a predominantly sexual mode of transmission: Secondary | ICD-10-CM | POA: Diagnosis not present

## 2021-10-29 MED ORDER — ESCITALOPRAM OXALATE 10 MG PO TABS: 10.0000 mg | ORAL_TABLET | Freq: Every day | ORAL | 2 refills | Status: DC

## 2021-10-29 MED ORDER — MEDROXYPROGESTERONE ACETATE 150 MG/ML IM SUSY: PREFILLED_SYRINGE | Freq: Once | INTRAMUSCULAR | Status: AC

## 2021-10-29 NOTE — Progress Notes (Signed)
Patient ID: Kaylee Miller, female   DOB: 12-26-1990, 31 y.o.   MRN: [id number] ?History of Present Illness: ?Kaylee Miller is a 31 year old black female, single, G2P2 in for well woman gyn exam and get depo. She has had some spotting 2-3 weeks before depo due, may also have spotting if strains with lifting or walking fast. +moody, is forgetful at times, too.  ?She had back surgery in October, and is moving.  ?PCP is Dr Kaylee Miller. ? ? ?Lab Results  ?Component Value Date  ? DIAGPAP (A) 09/18/2020  ?  - Atypical squamous cells of undetermined significance (ASC-US)  ? [place] Negative 09/18/2020  ?  ?Current Medications, Allergies, Past Medical History, Past Surgical History, Family History and Social History were reviewed in [employer] record.   ? ? ?Review of Systems: ?Patient denies any headaches, hearing loss, fatigue, blurred vision, shortness of breath, chest pain, abdominal pain, problems with bowel movements, urination, or intercourse.(No sex in 6 months). No joint pain. ?See HPI for positives ? ? ?Physical Exam:BP 121/71 (BP Location: Right Arm, Patient Position: Sitting, Cuff Size: Normal)   Pulse 82   Ht '5\' 7"'$  (1.702 m)   Wt 198 lb 12.8 oz (90.2 kg)   LMP 10/26/2021 (Exact Date)   BMI 31.14 kg/m?   ?General:  Well developed, well nourished, no acute distress ?Skin:  Warm and dry ?Neck:  Midline trachea, normal thyroid, good ROM, no lymphadenopathy ?Lungs; Clear to auscultation bilaterally ?Breast:  No dominant palpable mass, retraction, or nipple discharge ?Cardiovascular: Regular rate and rhythm ?Abdomen:  Soft, non tender, no hepatosplenomegaly,has dark mole upper abdomen but has not changed or gotten bigger, will watch.  ?Pelvic:  External genitalia is normal in appearance, no lesions.  The vagina is normal in appearance,has white discharge, CV swab obtained. Urethra has no lesions or masses. The cervix is bulbous.  Uterus is felt to be normal size, shape, and contour.  No adnexal  masses or tenderness noted.Bladder is non tender, no masses felt. ?Rectal:Deferred  ?Extremities/musculoskeletal:  No swelling or varicosities noted, no clubbing or cyanosis ?Psych:  No mood changes, alert and cooperative,seems happy ?Fall risk is low ?Depression screen [facility] 2/9 10/29/2021 09/13/2021 08/21/2021  ?Decreased Interest 1 0 0  ?Down, Depressed, Hopeless 1 0 0  ?PHQ - 2 Score 2 0 0  ?Altered sleeping 3 - -  ?Tired, decreased energy 3 - -  ?Change in appetite 3 - -  ?Feeling bad or failure about yourself  0 - -  ?Trouble concentrating 0 - -  ?Moving slowly or fidgety/restless 0 - -  ?Suicidal thoughts 0 - -  ?PHQ-9 Score 11 - -  ?Difficult doing work/chores - - -  ?Some recent data might be hidden  ?  ?GAD 7 : Generalized Anxiety Score 10/29/2021 09/18/2020  ?Nervous, Anxious, on Edge 1 0  ?Control/stop worrying 1 0  ?Worry too much - different things 3 2  ?Trouble relaxing 1 2  ?Restless 3 0  ?Easily annoyed or irritable 3 2  ?Afraid - awful might happen 1 0  ?Total GAD 7 Score 13 6  ? ? [place] - 10/29/21 0934   ? ?  ? Pregnancy Intention Screening  ? Does the patient want to become pregnant in the next year? No   ? Does the patient's partner want to become pregnant in the next year? No   ? Would the patient like to discuss contraceptive options today? No   ?  ? Contraception Wrap  Up  ? Current Method Hormonal Injection   ? End Method Hormonal Injection   ? Contraception Counseling Provided No   ? ?  ?  ? ?  ? Examination chaperoned by Marcelino Scot RN  ?  ?Impression and Plan: ?1. Screening for STDs (sexually transmitted diseases) ?CV swab sent for GC/CHL,trich,BV and yeast  ? ?2. Encounter for well woman exam with routine gynecological exam ?Physical in 1 year ?Pap in 2025 ? ?3. Encounter for surveillance of injectable contraceptive ?Will get depo today, has refills, next injection 12 weeks  ? ?4. Anxiety and depression ?Will start on lexapro 10 mg 1 daily and follow up in 12 weeks to see if helping  ? ? ? ?  ?   ?

## 2021-10-29 NOTE — Addendum Note (Signed)
Addended by: Dorita Sciara, Haila Dena A on: 10/29/2021 11:17 AM ? ? Modules accepted: Orders ? ?

## 2021-10-30 LAB — CERVICOVAGINAL ANCILLARY ONLY
Bacterial Vaginitis (gardnerella): POSITIVE — AB
Candida Glabrata: NEGATIVE
Candida Vaginitis: NEGATIVE
Chlamydia: NEGATIVE
Comment: NEGATIVE
Comment: NEGATIVE
Comment: NEGATIVE
Comment: NEGATIVE
Comment: NEGATIVE
Comment: NORMAL
Neisseria Gonorrhea: NEGATIVE
Trichomonas: NEGATIVE

## 2021-10-31 ENCOUNTER — Other Ambulatory Visit: Payer: Self-pay | Admitting: Adult Health

## 2021-10-31 MED ORDER — METRONIDAZOLE 500 MG PO TABS: 500.0000 mg | ORAL_TABLET | Freq: Two times a day (BID) | ORAL | 0 refills | Status: DC

## 2021-10-31 NOTE — Progress Notes (Signed)
+  BV on vaginal swab, will rx flagyl no sex or alcohol while taking ?

## 2021-11-08 ENCOUNTER — Telehealth: Payer: Self-pay | Admitting: Adult Health

## 2021-11-08 MED ORDER — FLUCONAZOLE 150 MG PO TABS: ORAL_TABLET | ORAL | 1 refills | Status: DC

## 2021-11-08 NOTE — Telephone Encounter (Signed)
Patient states she is still taking the Metronidazole but now thinks she has a yeast infection, having vaginal itching.  She still has 4 days left on her Metronidazole to take.  She is requesting Diflucan be sent to her pharmacy. Advised message will be sent to provider and to check back with her pharmacy later today unless she hears from [place].  Verbalized understanding.  ?

## 2021-11-08 NOTE — Telephone Encounter (Signed)
Rx sen in for diflucan  ?

## 2021-11-08 NOTE — Telephone Encounter (Signed)
Patient states that she has a yeast infection from the antibiotics and would like something called in for it. Please advise. She uses Product/process development scientist in [place].  ?

## 2021-11-08 NOTE — Addendum Note (Signed)
Addended by: Derrek Monaco A on: 11/08/2021 12:28 PM ? ? Modules accepted: Orders ? ?

## 2021-12-02 DIAGNOSIS — M5126 Other intervertebral disc displacement, lumbar region: Secondary | ICD-10-CM | POA: Diagnosis not present

## 2021-12-02 DIAGNOSIS — Z6831 Body mass index (BMI) 31.0-31.9, adult: Secondary | ICD-10-CM | POA: Diagnosis not present

## 2021-12-02 DIAGNOSIS — R030 Elevated blood-pressure reading, without diagnosis of hypertension: Secondary | ICD-10-CM | POA: Diagnosis not present

## 2021-12-05 ENCOUNTER — Ambulatory Visit
Admission: EM | Admit: 2021-12-05 | Discharge: 2021-12-05 | Disposition: A | Discharge status: Home / Self Care | Payer: Medicaid Other | Attending: Family Medicine | Admitting: Family Medicine

## 2021-12-05 DIAGNOSIS — G5702 Lesion of sciatic nerve, left lower limb: Secondary | ICD-10-CM

## 2021-12-05 MED ORDER — NAPROXEN 500 MG PO TABS: 500.0000 mg | ORAL_TABLET | Freq: Two times a day (BID) | ORAL | 0 refills | Status: DC | PRN

## 2021-12-05 MED ORDER — PREDNISONE 10 MG PO TABS: ORAL_TABLET | ORAL | 0 refills | Status: DC

## 2021-12-05 NOTE — ED Triage Notes (Signed)
Pt states she went to the Neuro Doctor Monday for a follow up on her L Spine surgery and he really didn't help ? ?Pt states he ordered an MRI and she is unable to go because of insurance issues ? ?Pt states that the past 4 days the spasms have been worse. She states the pain is radiating across her lower back ? ?Pt states she is already on Tizanidine but she is out of naproxen ? ?

## 2021-12-05 NOTE — ED Provider Notes (Signed)
?[facility] ? ? ? ?CSN: [id number] ?Arrival date & time: 12/05/21  1114 ? ? ?  ? ?History   ?Chief Complaint ?Chief Complaint  ?Patient presents with  ? back spasms  ? ? ?HPI ?Kaylee Miller is a 31 y.o. female.  ? ?Presenting today with ongoing issues with left lateral low back pain, muscle spasms radiating down toward hip and posterior left leg.  She went to see her in neurosurgeon earlier this week for a follow-up after a spinal surgery, was given naproxen and Zanaflex which she states has not been helping.  Needs a refill on naproxen.  She denies weakness, numbness, tingling, bowel or bladder incontinence, saddle anesthesia.  States she works an active job and that is making it worse. ? ?Past Medical History:  ?Diagnosis Date  ? Asthma   ? Phreesia 07/02/2020  ? Contraceptive management 02/08/2015  ? Headache(784.0)   ? migraines  ? History of bronchitis   ? HSV-2 (herpes simplex virus 2) infection   ? Pregnant   ? S/P C-section 07/28/2013  ? Sciatic nerve pain   ? Seasonal allergies   ? Vaginal Pap smear, abnormal   ? ?Patient Active Problem List  ? Diagnosis Date Noted  ? Encounter for surveillance of injectable contraceptive 10/29/2021  ? Encounter for well woman exam with routine gynecological exam 10/29/2021  ? Screening for STDs (sexually transmitted diseases) 10/29/2021  ? Anxiety and depression 10/29/2021  ? Leg swelling 09/13/2021  ? Body mass index (BMI) 30.0-30.9, adult 08/27/2021  ? Herniated nucleus pulposus, L5-S1, right 05/24/2021  ? Right hip pain 04/05/2021  ? Vaginal irritation 01/09/2021  ? Urinary frequency 01/09/2021  ? Spinal stenosis of lumbar region with neurogenic claudication 12/19/2020  ? Disc displacement, lumbar 11/12/2020  ? Essential (primary) hypertension 11/12/2020  ? ASCUS of cervix with negative high risk HPV 09/21/2020  ? Encounter for gynecological examination with Papanicolaou smear of cervix 09/18/2020  ? Sciatica 08/30/2020  ? Mild asthma without  complication 40/34/7425  ? Tobacco abuse 07/03/2020  ? Migraines 01/05/2013  ? ?Past Surgical History:  ?Procedure Laterality Date  ? adenoids    ? CESAREAN SECTION  2007  ? CESAREAN SECTION N/A 07/28/2013  ? Procedure: CESAREAN SECTION;  Surgeon: Jonnie Kind, MD;  Location: [facility];  Service: Obstetrics;  Laterality: N/A;  ? LUMBAR LAMINECTOMY/DECOMPRESSION MICRODISCECTOMY Right 05/25/2021  ? Procedure: DISCECTOMY Lumbar five - Sacral one;  Surgeon: Ashok Pall, MD;  Location: [facility];  Service: Neurosurgery;  Laterality: Right;  ? tubes in ears    ? ?OB History   ? ? Gravida  ?2  ? Para  ?2  ? Term  ?2  ? Preterm  ?   ? AB  ?   ? Living  ?2  ?  ? ? SAB  ?   ? IAB  ?   ? Ectopic  ?   ? Multiple  ?   ? Live Births  ?2  ?   ?  ?  ? ? ? ?Home Medications   ? ?Prior to Admission medications   ?Medication Sig Start Date End Date Taking? Authorizing Provider  ?naproxen (NAPROSYN) 500 MG tablet Take 1 tablet (500 mg total) by mouth 2 (two) times daily as needed. Do not take while taking prednisone 12/05/21  Yes Volney American, PA-C  ?predniSONE (DELTASONE) 10 MG tablet Take 6 tabs day one, 5 tabs day two, 4 tabs day three, etc 12/05/21  Yes Volney American, PA-C  ?  albuterol (VENTOLIN HFA) 108 (90 Base) MCG/ACT inhaler Inhale 2 puffs into the lungs every 6 (six) hours as needed for wheezing or shortness of breath. 06/10/20   Rolland Porter, MD  ?aspirin-acetaminophen-caffeine (EXCEDRIN MIGRAINE) [phone] MG tablet Take 1 tablet by mouth every 6 (six) hours as needed for migraine.    [provider]  ?docusate sodium (COLACE) 100 MG capsule Take 100 mg by mouth 2 (two) times daily.    [provider]  ?escitalopram (LEXAPRO) 10 MG tablet Take 1 tablet (10 mg total) by mouth daily. 10/29/21 10/29/22  Estill Dooms, NP  ?fluconazole (DIFLUCAN) 150 MG tablet Take 1 now and 1 in 3 days 11/08/21   Estill Dooms, NP  ?fluticasone (FLONASE) 50 MCG/ACT nasal spray Place 2 sprays into both  nostrils daily as needed for allergies or rhinitis.    [provider]  ?ibuprofen (ADVIL) 200 MG tablet Take 600 mg by mouth every 6 (six) hours as needed (For cramps).    [provider]  ?meclizine (ANTIVERT) 25 MG tablet Take 1 tablet (25 mg total) by mouth 3 (three) times daily as needed for dizziness. 01/11/21   Raspet, Derry Skill, PA-C  ?medroxyPROGESTERone Acetate 150 MG/ML SUSY Inject 1 mL (150 mg total) into the muscle every 3 (three) months. INJECT 1 ML INTO THE MUSCLE EVERY 3 MONTHS Strength: 150 mg/mL 08/21/21   Estill Dooms, NP  ?metroNIDAZOLE (FLAGYL) 500 MG tablet Take 1 tablet (500 mg total) by mouth 2 (two) times daily. 10/31/21   Estill Dooms, NP  ?ondansetron (ZOFRAN ODT) 4 MG disintegrating tablet Take 1 tablet (4 mg total) by mouth every 8 (eight) hours as needed for nausea or vomiting. 01/11/21   Raspet, Junie Panning K, PA-C  ?predniSONE (STERAPRED UNI-PAK 21 TAB) 10 MG (21) TBPK tablet Take as package instructions. 09/13/21   Lindell Spar, MD  ?SUMAtriptan (IMITREX) 50 MG tablet Take 50 mg by mouth every 2 (two) hours as needed for migraine. 05/17/21   [provider]  ?tiZANidine (ZANAFLEX) 4 MG tablet Take 1 tablet (4 mg total) by mouth every 6 (six) hours as needed for muscle spasms. 09/13/21   Lindell Spar, MD  ?Vitamin D, Ergocalciferol, (DRISDOL) 1.25 MG (50000 UNIT) CAPS capsule Take 1 capsule (50,000 Units total) by mouth every 7 (seven) days. 12/19/20   Lindell Spar, MD  ? ? ?Family History ?Family History  ?Problem Relation Age of Onset  ? Cancer Mother   ? Breast cancer Mother   ? Hypertension Father   ? Stroke Father   ? CAD Maternal Grandfather   ? CAD Paternal Grandmother   ? CAD Paternal Grandfather   ? Diabetes Other   ?     great-great grandmother  ? CAD Other   ? ? ?Social History ?Social History  ? ?Tobacco Use  ? Smoking status: Some Days  ?  Packs/day: 1.00  ?  Years: 9.00  ?  Pack years: 9.00  ?  Types: Cigarettes  ?  Passive exposure: Never   ? Smokeless tobacco: Never  ?Vaping Use  ? Vaping Use: Never used  ?Substance Use Topics  ? Alcohol use: Yes  ?  Comment: Occa  ? Drug use: Never  ? ? ? ?Allergies   ?Latex ? ? ?Review of Systems ?Review of Systems ?PER HPI ? ?Physical Exam ?Triage Vital Signs ?ED Triage Vitals  ?Enc Vitals Group  ?   BP 12/05/21 1303 105/72  ?   Pulse Rate  12/05/21 1303 81  ?   Resp 12/05/21 1303 18  ?   Temp 12/05/21 1303 99.3 ?F (37.4 ?C)  ?   Temp Source 12/05/21 1303 Oral  ?   SpO2 12/05/21 1303 96 %  ?   Weight --   ?   Height --   ?   Head Circumference --   ?   Peak Flow --   ?   Pain Score 12/05/21 1258 10  ?   Pain Loc --   ?   Pain Edu? --   ?   Excl. in [place]? --   ? ?No data found. ? ?Updated Vital Signs ?BP 105/72 (BP Location: Right Arm)   Pulse 81   Temp 99.3 ?F (37.4 ?C) (Oral)   Resp 18   SpO2 96%  ? ?Visual Acuity ?Right Eye Distance:   ?Left Eye Distance:   ?Bilateral Distance:   ? ?Right Eye Near:   ?Left Eye Near:    ?Bilateral Near:    ? ?Physical Exam ?Vitals and nursing note reviewed.  ?Constitutional:   ?   Appearance: Normal appearance. She is not ill-appearing.  ?HENT:  ?   Head: Atraumatic.  ?Eyes:  ?   Extraocular Movements: Extraocular movements intact.  ?   Conjunctiva/sclera: Conjunctivae normal.  ?Cardiovascular:  ?   Rate and Rhythm: Normal rate and regular rhythm.  ?   Heart sounds: Normal heart sounds.  ?Pulmonary:  ?   Effort: Pulmonary effort is normal.  ?   Breath sounds: Normal breath sounds.  ?Abdominal:  ?   General: Bowel sounds are normal. There is no distension.  ?   Palpations: Abdomen is soft.  ?   Tenderness: There is no abdominal tenderness. There is no right CVA tenderness, left CVA tenderness or guarding.  ?Musculoskeletal:     ?   General: Tenderness present. No swelling, deformity or signs of injury. Normal range of motion.  ?   Cervical back: Normal range of motion and neck supple.  ?   Comments: Midline spinal tenderness to palpation diffusely.  Negative straight leg raise  bilateral lower extremities.  Left lumbar musculature tender to palpation extending down piriformis  ?Skin: ?   General: Skin is warm and dry.  ?   Findings: No erythema.  ?Neurological:  ?   Mental Status: She is alert an

## 2021-12-19 ENCOUNTER — Other Ambulatory Visit: Payer: Self-pay | Admitting: Adult Health

## 2021-12-19 MED ORDER — ONDANSETRON 4 MG PO TBDP: 4.0000 mg | ORAL_TABLET | Freq: Three times a day (TID) | ORAL | 0 refills | Status: DC | PRN

## 2021-12-19 NOTE — Progress Notes (Signed)
Refilled zofran

## 2022-01-01 ENCOUNTER — Ambulatory Visit (HOSPITAL_COMMUNITY): Payer: Medicaid Other

## 2022-01-05 ENCOUNTER — Encounter (HOSPITAL_COMMUNITY): Payer: Self-pay | Admitting: Emergency Medicine

## 2022-01-05 ENCOUNTER — Other Ambulatory Visit: Payer: Self-pay

## 2022-01-05 ENCOUNTER — Emergency Department (HOSPITAL_COMMUNITY)
Admission: EM | Admit: 2022-01-05 | Discharge: 2022-01-05 | Disposition: A | Discharge status: Home / Self Care | Payer: Medicaid Other | Attending: Emergency Medicine | Admitting: Emergency Medicine

## 2022-01-05 DIAGNOSIS — F17200 Nicotine dependence, unspecified, uncomplicated: Secondary | ICD-10-CM | POA: Diagnosis not present

## 2022-01-05 DIAGNOSIS — M5441 Lumbago with sciatica, right side: Secondary | ICD-10-CM | POA: Diagnosis not present

## 2022-01-05 DIAGNOSIS — R52 Pain, unspecified: Secondary | ICD-10-CM | POA: Diagnosis not present

## 2022-01-05 DIAGNOSIS — Z7982 Long term (current) use of aspirin: Secondary | ICD-10-CM | POA: Diagnosis not present

## 2022-01-05 DIAGNOSIS — Z91040 Latex allergy status: Secondary | ICD-10-CM | POA: Diagnosis not present

## 2022-01-05 DIAGNOSIS — M79604 Pain in right leg: Secondary | ICD-10-CM | POA: Diagnosis not present

## 2022-01-05 DIAGNOSIS — M5431 Sciatica, right side: Secondary | ICD-10-CM | POA: Diagnosis present

## 2022-01-05 MED ORDER — METHYLPREDNISOLONE SODIUM SUCC 125 MG IJ SOLR
125.0000 mg | Freq: Once | INTRAMUSCULAR | Status: AC
  Administered 2022-01-05: 125 mg via INTRAVENOUS
  Filled 2022-01-05: qty 2

## 2022-01-05 MED ORDER — HYDROCODONE-ACETAMINOPHEN 5-325 MG PO TABS: 1.0000 | ORAL_TABLET | Freq: Four times a day (QID) | ORAL | 0 refills | Status: DC | PRN

## 2022-01-05 MED ORDER — MORPHINE SULFATE (PF) 4 MG/ML IV SOLN
4.0000 mg | Freq: Once | INTRAVENOUS | Status: AC
  Administered 2022-01-05: 4 mg via INTRAVENOUS
  Filled 2022-01-05: qty 1

## 2022-01-05 MED ORDER — KETOROLAC TROMETHAMINE 30 MG/ML IJ SOLN
30.0000 mg | Freq: Once | INTRAMUSCULAR | Status: AC
  Administered 2022-01-05: 30 mg via INTRAVENOUS
  Filled 2022-01-05: qty 1

## 2022-01-05 MED ORDER — PREDNISONE 10 MG PO TABS: 40.0000 mg | ORAL_TABLET | Freq: Every day | ORAL | 0 refills | Status: DC

## 2022-01-05 NOTE — ED Provider Triage Note (Signed)
Emergency Medicine Provider Triage Evaluation Note ? ?Kaylee Miller , a 31 y.o. female  was evaluated in triage.  Patient with history of prior back surgery performed by Dr. Christella Noa.  She was brought by ambulance complaining of severe pain in her low back radiating down her right leg.  This feels worse than what she experienced when she required surgery.  This pain has been worsening over the past several weeks, however has become so bad over the past 3 days that she can hardly walk.  She denies to me she is having any bowel or bladder complaints.  She denies weakness or numbness of the leg. ? ?She has been seen by Dr. Christella Noa within the past few weeks who ordered an MRI, however this was not approved by insurance and she was instructed to go through physical therapy first.  This does not seem to be helping her.  She is also taking naproxen and muscle relaxers with little relief. ? ?Review of Systems  ?Positive: Low back pain ?Negative: Bowel or bladder incontinence ? ?Physical Exam  ?BP 110/72   Pulse 67   Temp 97.6 ?F (36.4 ?C) (Oral)   Resp 20   Ht '5\' 7"'$  (1.702 m)   Wt 91 kg   SpO2 98%   BMI 31.42 kg/m?  ?Gen:   Awake, no distress, but appears uncomfortable ?Resp:  Normal effort  ?MSK:   There is no tenderness in the lower back.  She does have pain when attempting to sit up and when she moves her right leg.  DP pulses are palpable and sensation is intact throughout the entire right leg.  DTRs are trace and symmetrical in the patellar and Achilles tendons bilaterally. ? ? ?Medical Decision Making  ?Medically screening exam initiated at 6:35 AM.  Appropriate orders placed.  Kaylee Miller was informed that the remainder of the evaluation will be completed by another provider, this initial triage assessment does not replace that evaluation, and the importance of remaining in the ED until their evaluation is complete. ? ? ?  ?Veryl Speak, MD ?01/05/22 [phone] ? ?

## 2022-01-05 NOTE — ED Notes (Signed)
Patient ambulatory to restroom at this time. 

## 2022-01-05 NOTE — ED Provider Notes (Signed)
?[facility] ?Provider Note ? ? ?CSN: [id number] ?Arrival date & time: 01/05/22  0607 ? ?  ? ?History ? ?Chief Complaint  ?Patient presents with  ? Back Pain  ? ? ?Kaylee Miller is a 31 y.o. female. ? ?Patient followed by Dr. Cyndy Freeze neurosurgery for right-sided sciatica.'s been worse in the last 3 days with increased pain radiating down her right lower extremity from the right side of the back.  Patient had surgery in October for similar problems.  But now it is recurrent.  Dr. Christella Noa is ordered an MRI but patient has not had that done yet.  Unfortunately we do not have MRI available here today.  But we can set it up for her as an outpatient tomorrow.  Patient screened by Dr. Stark Jock and he ordered her pain medicine as well as steroids IV.  Patient with some improvement.  Patient has tingling on the bottom of the right foot.  But the toes do work.  No incontinence no concerns for cauda equina.  Patient completed a steroid taper pack in April has not been on any steroids here she did take 1 dose that she had at home last night.  Patient last seen by her neurosurgeon in April. ? ?Patient is a smoker.  Past medical history significant for sciatic nerve pain and seasonal allergies. ? ? ? ? ?  ? ?Home Medications ?Prior to Admission medications   ?Medication Sig Start Date End Date Taking? Authorizing Provider  ?HYDROcodone-acetaminophen (NORCO/VICODIN) 5-325 MG tablet Take 1 tablet by mouth every 6 (six) hours as needed. 01/05/22  Yes Fredia Sorrow, MD  ?predniSONE (DELTASONE) 10 MG tablet Take 4 tablets (40 mg total) by mouth daily. 01/05/22  Yes Fredia Sorrow, MD  ?albuterol (VENTOLIN HFA) 108 (90 Base) MCG/ACT inhaler Inhale 2 puffs into the lungs every 6 (six) hours as needed for wheezing or shortness of breath. 06/10/20   Rolland Porter, MD  ?aspirin-acetaminophen-caffeine (EXCEDRIN MIGRAINE) [phone] MG tablet Take 1 tablet by mouth every 6 (six) hours as needed for migraine.    [provider]  ?docusate sodium (COLACE) 100 MG capsule Take 100 mg by mouth 2 (two) times daily.    [provider]  ?escitalopram (LEXAPRO) 10 MG tablet Take 1 tablet (10 mg total) by mouth daily. 10/29/21 10/29/22  Estill Dooms, NP  ?fluconazole (DIFLUCAN) 150 MG tablet Take 1 now and 1 in 3 days 11/08/21   Estill Dooms, NP  ?fluticasone (FLONASE) 50 MCG/ACT nasal spray Place 2 sprays into both nostrils daily as needed for allergies or rhinitis.    [provider]  ?ibuprofen (ADVIL) 200 MG tablet Take 600 mg by mouth every 6 (six) hours as needed (For cramps).    [provider]  ?meclizine (ANTIVERT) 25 MG tablet Take 1 tablet (25 mg total) by mouth 3 (three) times daily as needed for dizziness. 01/11/21   Raspet, Derry Skill, PA-C  ?medroxyPROGESTERone Acetate 150 MG/ML SUSY Inject 1 mL (150 mg total) into the muscle every 3 (three) months. INJECT 1 ML INTO THE MUSCLE EVERY 3 MONTHS Strength: 150 mg/mL 08/21/21   Estill Dooms, NP  ?metroNIDAZOLE (FLAGYL) 500 MG tablet Take 1 tablet (500 mg total) by mouth 2 (two) times daily. 10/31/21   Estill Dooms, NP  ?naproxen (NAPROSYN) 500 MG tablet Take 1 tablet (500 mg total) by mouth 2 (two) times daily as needed. Do not take while taking prednisone 12/05/21   Volney American, PA-C  ?  ondansetron (ZOFRAN ODT) 4 MG disintegrating tablet Take 1 tablet (4 mg total) by mouth every 8 (eight) hours as needed for nausea or vomiting. 12/19/21   Estill Dooms, NP  ?predniSONE (DELTASONE) 10 MG tablet Take 6 tabs day one, 5 tabs day two, 4 tabs day three, etc 12/05/21   Volney American, PA-C  ?predniSONE (STERAPRED UNI-PAK 21 TAB) 10 MG (21) TBPK tablet Take as package instructions. 09/13/21   Lindell Spar, MD  ?SUMAtriptan (IMITREX) 50 MG tablet Take 50 mg by mouth every 2 (two) hours as needed for migraine. 05/17/21   [provider]  ?tiZANidine (ZANAFLEX) 4 MG tablet Take 1 tablet (4 mg total) by mouth  every 6 (six) hours as needed for muscle spasms. 09/13/21   Lindell Spar, MD  ?Vitamin D, Ergocalciferol, (DRISDOL) 1.25 MG (50000 UNIT) CAPS capsule Take 1 capsule (50,000 Units total) by mouth every 7 (seven) days. 12/19/20   Lindell Spar, MD  ?   ? ?Allergies    ?Latex   ? ?Review of Systems   ?Review of Systems  ?Constitutional:  Negative for chills and fever.  ?HENT:  Negative for ear pain and sore throat.   ?Eyes:  Negative for pain and visual disturbance.  ?Respiratory:  Negative for cough and shortness of breath.   ?Cardiovascular:  Negative for chest pain and palpitations.  ?Gastrointestinal:  Negative for abdominal pain and vomiting.  ?Genitourinary:  Negative for difficulty urinating, dysuria and hematuria.  ?Musculoskeletal:  Positive for back pain. Negative for arthralgias.  ?Skin:  Negative for color change and rash.  ?Neurological:  Positive for numbness. Negative for seizures and syncope.  ?All other systems reviewed and are negative. ? ?Physical Exam ?Updated Vital Signs ?BP 125/61   Pulse 70   Temp 97.6 ?F (36.4 ?C) (Oral)   Resp 18   Ht 1.702 m ('5\' 7"'$ )   Wt 91 kg   SpO2 100%   BMI 31.42 kg/m?  ?Physical Exam ?Vitals and nursing note reviewed.  ?Constitutional:   ?   General: She is not in acute distress. ?   Appearance: Normal appearance. She is well-developed.  ?HENT:  ?   Head: Normocephalic and atraumatic.  ?Eyes:  ?   Conjunctiva/sclera: Conjunctivae normal.  ?Cardiovascular:  ?   Rate and Rhythm: Normal rate and regular rhythm.  ?   Heart sounds: No murmur heard. ?Pulmonary:  ?   Effort: Pulmonary effort is normal. No respiratory distress.  ?   Breath sounds: Normal breath sounds.  ?Abdominal:  ?   Palpations: Abdomen is soft.  ?   Tenderness: There is no abdominal tenderness.  ?Musculoskeletal:     ?   General: No swelling.  ?   Cervical back: Normal range of motion and neck supple.  ?   Comments: Some numbness to the bottom of the right foot.  Toe strength is good.  No numbness  to the left lower extremity and good strength to the left lower extremity.  Good cap refill to both feet.  Dorsalis pedis pulse 1+ bilaterally. ? ?  ?Skin: ?   General: Skin is warm and dry.  ?   Capillary Refill: Capillary refill takes less than 2 seconds.  ?Neurological:  ?   Mental Status: She is alert and oriented to person, place, and time.  ?   Cranial Nerves: No cranial nerve deficit.  ?   Sensory: Sensory deficit present.  ?   Motor: No weakness.  ?Psychiatric:     ?  Mood and Affect: Mood normal.  ? ? ?ED Results / Procedures / Treatments   ?Labs ?(all labs ordered are listed, but only abnormal results are displayed) ?Labs Reviewed - No data to display ? ?EKG ?None ? ?Radiology ?No results found. ? ?Procedures ?Procedures  ? ? ?Medications Ordered in ED ?Medications  ?ketorolac (TORADOL) 30 MG/ML injection 30 mg (30 mg Intravenous Given 01/05/22 0656)  ?morphine (PF) 4 MG/ML injection 4 mg (4 mg Intravenous Given 01/05/22 0656)  ?methylPREDNISolone sodium succinate (SOLU-MEDROL) 125 mg/2 mL injection 125 mg (125 mg Intravenous Given 01/05/22 0656)  ? ? ?ED Course/ Medical Decision Making/ A&P ?  ?                        ?Medical Decision Making ?Amount and/or Complexity of Data Reviewed ?Radiology: ordered. ? ?Risk ?Prescription drug management. ? ? ?Patient symptoms consistent with right sciatica.  Patient did have surgery to correct similar symptoms in October.  Its been recurrent.  Her neurosurgeon is following her.  In evaluating this.  Patient will make an appointment to follow-up with them.  Symptoms all consistent with right-sided sciatica no concerns for cauda equina.  No left lower extremity abnormalities and also no incontinence. ? ?Patient received morphine and steroids here. ? ?We will discharge her home on a hydrocodone prepack and a 5-day course of prednisone.  Work note and have ordered outpatient MRI for her. ? ? ?Final Clinical Impression(s) / ED Diagnoses ?Final diagnoses:  ?Sciatica of  right side  ? ? ?Rx / DC Orders ?ED Discharge Orders   ? ?      Ordered  ?  predniSONE (DELTASONE) 10 MG tablet  Daily       ? 01/05/22 0748  ?  HYDROcodone-acetaminophen (NORCO/VICODIN) 5-325 MG tablet  Every 6 hour

## 2022-01-05 NOTE — ED Triage Notes (Signed)
Pt with c/o lower back pain that radiated down R leg. ?

## 2022-01-05 NOTE — Discharge Instructions (Signed)
Follow-up with your neurosurgeon.  Take the prednisone as directed for the next 5 days.  And take the hydrocodone as needed for severe pain.  If not taking hydrocodone can take Tylenol.  Also MRI ordered of lumbar spine for here radiology will contact you. ? ?Work note provided. ?

## 2022-01-06 ENCOUNTER — Telehealth: Payer: Self-pay

## 2022-01-06 NOTE — Telephone Encounter (Signed)
Transition Care Management Unsuccessful Follow-up Telephone Call ? ?Date of discharge and from where:  01/05/2022 from [facility] ? ?Attempts:  1st Attempt ? ?Reason for unsuccessful TCM follow-up call:  Left voice message ? ? ? ?

## 2022-01-07 ENCOUNTER — Encounter: Payer: Self-pay | Admitting: *Deleted

## 2022-01-07 ENCOUNTER — Telehealth: Payer: Self-pay | Admitting: Internal Medicine

## 2022-01-07 ENCOUNTER — Telehealth: Payer: Self-pay | Admitting: Orthopaedic Surgery

## 2022-01-07 ENCOUNTER — Telehealth: Payer: Self-pay

## 2022-01-07 ENCOUNTER — Ambulatory Visit (INDEPENDENT_AMBULATORY_CARE_PROVIDER_SITE_OTHER): Payer: Medicaid Other | Admitting: Internal Medicine

## 2022-01-07 ENCOUNTER — Encounter: Payer: Self-pay | Admitting: Internal Medicine

## 2022-01-07 DIAGNOSIS — M5126 Other intervertebral disc displacement, lumbar region: Secondary | ICD-10-CM

## 2022-01-07 MED ORDER — HYDROCODONE-ACETAMINOPHEN 5-325 MG PO TABS: 1.0000 | ORAL_TABLET | Freq: Four times a day (QID) | ORAL | 0 refills | Status: DC | PRN

## 2022-01-07 NOTE — Telephone Encounter (Signed)
Spoke w pt appt moved to 01-07-22 to discuss with Dr Posey Pronto  ?

## 2022-01-07 NOTE — Telephone Encounter (Signed)
Patient called to ask if she may see Dr Luna Glasgow for right leg, said having problem walking. Relayed next available date for Dr Luna Glasgow, 01/28/22, and out of office until then. Patient relays that she was seen at [facility] Emergency room on 01/05/22 for related issues. Notes show MRI has been ordered. States has also been seen by Dr Christella Noa at [facility] on 12/02/21. States Dr Christella Noa has ordered physical therapy. Please review and advise if patient is to been seen back at our clinic or continue to follow up with Dr Christella Noa? ?

## 2022-01-07 NOTE — Telephone Encounter (Signed)
Patient called in regard to ER visit on 5/14. ? ?States that ER advised MRI , needs order sent in for STAT  ? ?Also spoke neuro office they also need patient to have MRI as well ? ?Patient wants a call back in regard. ?Patient upcoming appt 5/18 ?

## 2022-01-07 NOTE — Progress Notes (Signed)
?  ? ?Virtual Visit via Telephone Note  ? ?This visit type was conducted due to national recommendations for restrictions regarding the COVID-19 Pandemic (e.g. social distancing) in an effort to limit this patient's exposure and mitigate transmission in our community.  Due to her co-morbid illnesses, this patient is at least at moderate risk for complications without adequate follow up.  This format is felt to be most appropriate for this patient at this time.  The patient did not have access to video technology/had technical difficulties with video requiring transitioning to audio format only (telephone).  All issues noted in this document were discussed and addressed.  No physical exam could be performed with this format. ? ?Evaluation Performed:  Follow-up visit ? ?Date:  01/07/2022  ? ?ID:  Kaylee Miller, DOB 11-18-1990, MRN [id number] ? ?Patient Location: Home ?Provider Location: Office/Clinic ? ?Participants: Patient ?Location of Patient: Home ?Location of Provider: Telehealth ?Consent was obtain for visit to be over via telehealth. ?I verified that I am speaking with the correct person using two identifiers. ? ?PCP:  Lindell Spar, MD  ? ?Chief Complaint: Low back pain ? ?History of Present Illness:   ? ?Kaylee Miller is a 31 y.o. female who has a televisit for c/o acute on chronic low back pain, which is constant, sharp, radiating to right leg with weakness of the right LE.  She is not able to stand or walk due to severe low back pain and right leg weakness.  Of note, she has history of lumbar disc displacement, s/p lumbar laminectomy with microdiscectomy in 10/22.  She saw Dr. Christella Noa in 04/23, and was to have MRI of lumbar spine for new onset right low back pain, but her insurance denied it.  She went to ER on 05/12 for acute low back pain, and was given prednisone taper and Norco as needed for severe pain.  She currently denies saddle anesthesia, urinary or stool incontinence. ? ?The patient does  not have symptoms concerning for COVID-19 infection (fever, chills, cough, or new shortness of breath).  ? ?Past Medical, Surgical, Social History, Allergies, and Medications have been Reviewed. ? ?Past Medical History:  ?Diagnosis Date  ? Asthma   ? Phreesia 07/02/2020  ? Contraceptive management 02/08/2015  ? Headache(784.0)   ? migraines  ? History of bronchitis   ? HSV-2 (herpes simplex virus 2) infection   ? Pregnant   ? S/P C-section 07/28/2013  ? Sciatic nerve pain   ? Seasonal allergies   ? Vaginal Pap smear, abnormal   ? ?Past Surgical History:  ?Procedure Laterality Date  ? adenoids    ? CESAREAN SECTION  2007  ? CESAREAN SECTION N/A 07/28/2013  ? Procedure: CESAREAN SECTION;  Surgeon: Jonnie Kind, MD;  Location: [facility];  Service: Obstetrics;  Laterality: N/A;  ? LUMBAR LAMINECTOMY/DECOMPRESSION MICRODISCECTOMY Right 05/25/2021  ? Procedure: DISCECTOMY Lumbar five - Sacral one;  Surgeon: Ashok Pall, MD;  Location: [facility];  Service: Neurosurgery;  Laterality: Right;  ? tubes in ears    ?  ? ?Current Meds  ?Medication Sig  ? albuterol (VENTOLIN HFA) 108 (90 Base) MCG/ACT inhaler Inhale 2 puffs into the lungs every 6 (six) hours as needed for wheezing or shortness of breath.  ? aspirin-acetaminophen-caffeine (EXCEDRIN MIGRAINE) 250-250-65 MG tablet Take 1 tablet by mouth every 6 (six) hours as needed for migraine.  ? docusate sodium (COLACE) 100 MG capsule Take 100 mg by mouth 2 (two) times daily.  ? escitalopram (LEXAPRO)  10 MG tablet Take 1 tablet (10 mg total) by mouth daily.  ? fluticasone (FLONASE) 50 MCG/ACT nasal spray Place 2 sprays into both nostrils daily as needed for allergies or rhinitis.  ? ibuprofen (ADVIL) 200 MG tablet Take 600 mg by mouth every 6 (six) hours as needed (For cramps).  ? meclizine (ANTIVERT) 25 MG tablet Take 1 tablet (25 mg total) by mouth 3 (three) times daily as needed for dizziness.  ? medroxyPROGESTERone Acetate 150 MG/ML SUSY Inject 1 mL (150 mg total) into the muscle  every 3 (three) months. INJECT 1 ML INTO THE MUSCLE EVERY 3 MONTHS Strength: 150 mg/mL  ? ondansetron (ZOFRAN ODT) 4 MG disintegrating tablet Take 1 tablet (4 mg total) by mouth every 8 (eight) hours as needed for nausea or vomiting.  ? predniSONE (DELTASONE) 10 MG tablet Take 4 tablets (40 mg total) by mouth daily.  ? SUMAtriptan (IMITREX) 50 MG tablet Take 50 mg by mouth every 2 (two) hours as needed for migraine.  ? tiZANidine (ZANAFLEX) 4 MG tablet Take 1 tablet (4 mg total) by mouth every 6 (six) hours as needed for muscle spasms.  ? Vitamin D, Ergocalciferol, (DRISDOL) 1.25 MG (50000 UNIT) CAPS capsule Take 1 capsule (50,000 Units total) by mouth every 7 (seven) days.  ? [DISCONTINUED] HYDROcodone-acetaminophen (NORCO/VICODIN) 5-325 MG tablet Take 1 tablet by mouth every 6 (six) hours as needed.  ? [DISCONTINUED] predniSONE (DELTASONE) 10 MG tablet Take 6 tabs day one, 5 tabs day two, 4 tabs day three, etc  ? [DISCONTINUED] predniSONE (STERAPRED UNI-PAK 21 TAB) 10 MG (21) TBPK tablet Take as package instructions.  ?  ? ?Allergies:   Latex  ? ?ROS:   ?Please see the history of present illness.    ? ?All other systems reviewed and are negative. ? ? ?Labs/Other Tests and Data Reviewed:   ? ?Recent Labs: ?05/20/2021: Magnesium 2.0 ?05/24/2021: BUN 15; Creatinine, Ser 0.81; Hemoglobin 13.1; Platelets 196; Potassium 3.3; Sodium 138  ? ?Recent Lipid Panel ?Lab Results  ?Component Value Date/Time  ? CHOL 223 (H) 07/03/2020 10:58 AM  ? TRIG 79 07/03/2020 10:58 AM  ? HDL 51 07/03/2020 10:58 AM  ? CHOLHDL 4.4 07/03/2020 10:58 AM  ? LDLCALC 158 (H) 07/03/2020 10:58 AM  ? ? ?Wt Readings from Last 3 Encounters:  ?01/05/22 200 lb 9.9 oz (91 kg)  ?10/29/21 198 lb 12.8 oz (90.2 kg)  ?09/13/21 196 lb (88.9 kg)  ?  ? ?ASSESSMENT & PLAN:   ? ?Disc displacement, lumbar ?Acute on chronic low back pain with radiculopathy ?Went to ER recently- on prednisone taper and Norco PRN, refilled Norco for now ?Check MRI lumbar spine STAT -  could not get MRI lumbar spine on the day of ER visit as they did not have availability ?Work note provided ? ?s/p lumbar laminecomy with microdiscectomy in 10/22 ?Work restrictions discussed ?Advised to contact Spine surgeon for back pain and new onset left leg pain and weakness ? ? ?Time:   ?Today, I have spent 15 minutes reviewing the chart, including problem list, medications, and with the patient with telehealth technology discussing the above problems. ? ? ?Medication Adjustments/Labs and Tests Ordered: ?Current medicines are reviewed at length with the patient today.  Concerns regarding medicines are outlined above.  ? ?Tests Ordered: ?Orders Placed This Encounter  ?Procedures  ? MR Lumbar Spine Wo Contrast  ? ? ?Medication Changes: ?Meds ordered this encounter  ?Medications  ? DISCONTD: HYDROcodone-acetaminophen (NORCO/VICODIN) 5-325 MG tablet  ?  Sig:  Take 1 tablet by mouth every 6 (six) hours as needed.  ?  Dispense:  15 tablet  ?  Refill:  0  ? HYDROcodone-acetaminophen (NORCO/VICODIN) 5-325 MG tablet  ?  Sig: Take 1 tablet by mouth every 6 (six) hours as needed for severe pain.  ?  Dispense:  15 tablet  ?  Refill:  0  ? ? ? ?Note: This dictation was prepared with Dragon dictation along with smaller phrase technology. Similar sounding words can be transcribed inadequately or may not be corrected upon review. Any transcriptional errors that result from this process are unintentional.  ?  ? ? ?Disposition:  Follow up  ?Signed, ?Lindell Spar, MD  ?01/07/2022 11:51 AM    ? ?[facility] ?

## 2022-01-07 NOTE — Assessment & Plan Note (Addendum)
Acute on chronic low back pain with radiculopathy ?Went to ER recently- on prednisone taper and Norco PRN, refilled Norco for now ?Check MRI lumbar spine STAT - could not get MRI lumbar spine on the day of ER visit as they did not have availability ?Work note provided ? ?s/p lumbar laminecomy with microdiscectomy in 10/22 ?Work restrictions discussed ?Advised to contact Spine surgeon for back pain and new onset left leg pain and weakness ? ?

## 2022-01-07 NOTE — Telephone Encounter (Signed)
Dani called from pre services center with [place] need authorization that was started with her insurance needs to be updated, has the wrong npi#.  ?Call back # [phone] ext [phone] ?

## 2022-01-07 NOTE — Patient Instructions (Signed)
Please continue taking prednisone as prescribed. ? ?Please take Norco as needed for severe pain.  Okay to take Tylenol or ibuprofen for mild-to-moderate pain. ?

## 2022-01-07 NOTE — Telephone Encounter (Signed)
Pt advised work note is in chart and MRI is set up per provider  ?

## 2022-01-07 NOTE — Telephone Encounter (Signed)
Called back to patient to relay; left message, stated to call back if any questions. ?

## 2022-01-07 NOTE — Telephone Encounter (Signed)
Visit with PCP, closing encounter.  ?

## 2022-01-08 ENCOUNTER — Ambulatory Visit (HOSPITAL_COMMUNITY)
Admission: RE | Admit: 2022-01-08 | Discharge: 2022-01-08 | Disposition: A | Discharge status: Home / Self Care | Payer: Medicaid Other | Source: Ambulatory Visit | Attending: Internal Medicine | Admitting: Internal Medicine

## 2022-01-08 DIAGNOSIS — M5126 Other intervertebral disc displacement, lumbar region: Secondary | ICD-10-CM | POA: Insufficient documentation

## 2022-01-08 DIAGNOSIS — M545 Low back pain, unspecified: Secondary | ICD-10-CM | POA: Diagnosis not present

## 2022-01-08 NOTE — Telephone Encounter (Signed)
Spoke with melanie updated prior auth was approved for this am notified [facility]  ?

## 2022-01-09 ENCOUNTER — Ambulatory Visit: Payer: Medicaid Other | Admitting: Family Medicine

## 2022-01-09 ENCOUNTER — Other Ambulatory Visit: Payer: Self-pay | Admitting: Neurosurgery

## 2022-01-09 DIAGNOSIS — Z6831 Body mass index (BMI) 31.0-31.9, adult: Secondary | ICD-10-CM | POA: Diagnosis not present

## 2022-01-09 DIAGNOSIS — M5126 Other intervertebral disc displacement, lumbar region: Secondary | ICD-10-CM | POA: Diagnosis not present

## 2022-01-14 ENCOUNTER — Other Ambulatory Visit: Payer: Self-pay

## 2022-01-14 ENCOUNTER — Encounter (HOSPITAL_COMMUNITY): Payer: Self-pay | Admitting: Neurosurgery

## 2022-01-14 ENCOUNTER — Telehealth (HOSPITAL_COMMUNITY): Payer: Self-pay | Admitting: Physical Therapy

## 2022-01-14 ENCOUNTER — Ambulatory Visit (HOSPITAL_COMMUNITY): Payer: Medicaid Other | Admitting: Physical Therapy

## 2022-01-14 NOTE — Progress Notes (Signed)
Spoke with pt for pre-op call. Pt denies cardiac history, HTN or Diabetes.   Shower instructions given to pt.  

## 2022-01-14 NOTE — Telephone Encounter (Signed)
Patient will have surgery on her back tomorrow and will ask for a new referral from the surgeon after surgery.  Patient request to close this referral

## 2022-01-15 ENCOUNTER — Other Ambulatory Visit: Payer: Self-pay

## 2022-01-15 ENCOUNTER — Observation Stay (HOSPITAL_COMMUNITY)
Admission: RE | Admit: 2022-01-15 | Discharge: 2022-01-16 | Disposition: A | Discharge status: Home / Self Care | Payer: Medicaid Other | Attending: Neurosurgery | Admitting: Neurosurgery

## 2022-01-15 ENCOUNTER — Encounter (HOSPITAL_COMMUNITY)
Admission: RE | Disposition: A | Discharge status: Home / Self Care | Payer: Self-pay | Source: Home / Self Care | Attending: Neurosurgery

## 2022-01-15 ENCOUNTER — Encounter (HOSPITAL_COMMUNITY): Payer: Self-pay | Admitting: Neurosurgery

## 2022-01-15 ENCOUNTER — Ambulatory Visit (HOSPITAL_COMMUNITY): Payer: Medicaid Other

## 2022-01-15 ENCOUNTER — Ambulatory Visit (HOSPITAL_BASED_OUTPATIENT_CLINIC_OR_DEPARTMENT_OTHER): Payer: Medicaid Other | Admitting: Anesthesiology

## 2022-01-15 ENCOUNTER — Ambulatory Visit (HOSPITAL_COMMUNITY): Payer: Medicaid Other | Admitting: Anesthesiology

## 2022-01-15 DIAGNOSIS — F17210 Nicotine dependence, cigarettes, uncomplicated: Secondary | ICD-10-CM | POA: Insufficient documentation

## 2022-01-15 DIAGNOSIS — J45909 Unspecified asthma, uncomplicated: Secondary | ICD-10-CM | POA: Diagnosis not present

## 2022-01-15 DIAGNOSIS — Z91040 Latex allergy status: Secondary | ICD-10-CM | POA: Insufficient documentation

## 2022-01-15 DIAGNOSIS — M5126 Other intervertebral disc displacement, lumbar region: Secondary | ICD-10-CM | POA: Diagnosis present

## 2022-01-15 DIAGNOSIS — M5127 Other intervertebral disc displacement, lumbosacral region: Principal | ICD-10-CM | POA: Insufficient documentation

## 2022-01-15 DIAGNOSIS — Z79899 Other long term (current) drug therapy: Secondary | ICD-10-CM | POA: Insufficient documentation

## 2022-01-15 DIAGNOSIS — Z7982 Long term (current) use of aspirin: Secondary | ICD-10-CM | POA: Diagnosis not present

## 2022-01-15 DIAGNOSIS — Z8616 Personal history of COVID-19: Secondary | ICD-10-CM | POA: Diagnosis not present

## 2022-01-15 DIAGNOSIS — Z9889 Other specified postprocedural states: Secondary | ICD-10-CM | POA: Diagnosis not present

## 2022-01-15 HISTORY — PX: LUMBAR LAMINECTOMY/DECOMPRESSION MICRODISCECTOMY: SHX5026

## 2022-01-15 HISTORY — DX: Anemia, unspecified: D64.9

## 2022-01-15 HISTORY — DX: Anxiety disorder, unspecified: F41.9

## 2022-01-15 HISTORY — DX: COVID-19: U07.1

## 2022-01-15 HISTORY — DX: Pneumonia, unspecified organism: J18.9

## 2022-01-15 HISTORY — DX: Depression, unspecified: F32.A

## 2022-01-15 HISTORY — DX: Gastro-esophageal reflux disease without esophagitis: K21.9

## 2022-01-15 LAB — SURGICAL PCR SCREEN
MRSA, PCR: NEGATIVE
Staphylococcus aureus: POSITIVE — AB

## 2022-01-15 LAB — CBC
HCT: 47.6 % — ABNORMAL HIGH (ref 36.0–46.0)
Hemoglobin: 15.5 g/dL — ABNORMAL HIGH (ref 12.0–15.0)
MCH: 28.8 pg (ref 26.0–34.0)
MCHC: 32.6 g/dL (ref 30.0–36.0)
MCV: 88.3 fL (ref 80.0–100.0)
Platelets: 252 K/uL (ref 150–400)
RBC: 5.39 MIL/uL — ABNORMAL HIGH (ref 3.87–5.11)
RDW: 13.6 % (ref 11.5–15.5)
WBC: 8.4 K/uL (ref 4.0–10.5)
nRBC: 0 % (ref 0.0–0.2)

## 2022-01-15 LAB — HCG, SERUM, QUALITATIVE: Preg, Serum: NEGATIVE

## 2022-01-15 SURGERY — LUMBAR LAMINECTOMY/DECOMPRESSION MICRODISCECTOMY 1 LEVEL
Anesthesia: General | Site: Spine Lumbar | Laterality: Right

## 2022-01-15 MED ORDER — HYDROCODONE-ACETAMINOPHEN 7.5-325 MG PO TABS
2.0000 | ORAL_TABLET | ORAL | Status: DC | PRN
  Administered 2022-01-15 – 2022-01-16 (×3): 2 via ORAL
  Filled 2022-01-15 (×4): qty 2

## 2022-01-15 MED ORDER — ACETAMINOPHEN 500 MG PO TABS
1000.0000 mg | ORAL_TABLET | Freq: Once | ORAL | Status: AC
  Administered 2022-01-15: 1000 mg via ORAL
  Filled 2022-01-15: qty 2

## 2022-01-15 MED ORDER — SODIUM CHLORIDE 0.9% FLUSH: 3.0000 mL | Freq: Two times a day (BID) | INTRAVENOUS | Status: DC

## 2022-01-15 MED ORDER — SODIUM CHLORIDE 0.9 % IV SOLN: 250.0000 mL | INTRAVENOUS | Status: DC

## 2022-01-15 MED ORDER — AMISULPRIDE (ANTIEMETIC) 5 MG/2ML IV SOLN
10.0000 mg | Freq: Once | INTRAVENOUS | Status: AC
  Administered 2022-01-15: 10 mg via INTRAVENOUS

## 2022-01-15 MED ORDER — DEXAMETHASONE SODIUM PHOSPHATE 10 MG/ML IJ SOLN
INTRAMUSCULAR | Status: DC | PRN
Start: 2022-01-15 — End: 2022-01-15
  Administered 2022-01-15: 10 mg via INTRAVENOUS

## 2022-01-15 MED ORDER — HYDROMORPHONE HCL 1 MG/ML IJ SOLN
INTRAMUSCULAR | Status: AC
  Filled 2022-01-15: qty 1

## 2022-01-15 MED ORDER — LIDOCAINE-EPINEPHRINE 0.5 %-1:200000 IJ SOLN
INTRAMUSCULAR | Status: AC
  Filled 2022-01-15: qty 1

## 2022-01-15 MED ORDER — SODIUM CHLORIDE 0.9% FLUSH: 3.0000 mL | INTRAVENOUS | Status: DC | PRN

## 2022-01-15 MED ORDER — MIDAZOLAM HCL 2 MG/2ML IJ SOLN
INTRAMUSCULAR | Status: DC | PRN
  Administered 2022-01-15: 2 mg via INTRAVENOUS

## 2022-01-15 MED ORDER — PHENOL 1.4 % MT LIQD: 1.0000 | OROMUCOSAL | Status: DC | PRN

## 2022-01-15 MED ORDER — LIDOCAINE 2% (20 MG/ML) 5 ML SYRINGE
INTRAMUSCULAR | Status: AC
  Filled 2022-01-15: qty 10

## 2022-01-15 MED ORDER — ADHERUS DURAL SEALANT
PACK | TOPICAL | Status: DC | PRN
  Administered 2022-01-15: 1 via TOPICAL

## 2022-01-15 MED ORDER — CEFAZOLIN SODIUM-DEXTROSE 2-3 GM-%(50ML) IV SOLR
INTRAVENOUS | Status: DC | PRN
  Administered 2022-01-15: 2 g via INTRAVENOUS

## 2022-01-15 MED ORDER — PHENYLEPHRINE 80 MCG/ML (10ML) SYRINGE FOR IV PUSH (FOR BLOOD PRESSURE SUPPORT)
PREFILLED_SYRINGE | INTRAVENOUS | Status: DC | PRN
  Administered 2022-01-15 (×2): 80 ug via INTRAVENOUS
  Administered 2022-01-15: 160 ug via INTRAVENOUS

## 2022-01-15 MED ORDER — CEFAZOLIN SODIUM 1 G IJ SOLR
INTRAMUSCULAR | Status: AC
  Filled 2022-01-15: qty 20

## 2022-01-15 MED ORDER — THROMBIN 5000 UNITS EX SOLR
CUTANEOUS | Status: AC
  Filled 2022-01-15: qty 10000

## 2022-01-15 MED ORDER — PROPOFOL 10 MG/ML IV BOLUS
INTRAVENOUS | Status: DC | PRN
  Administered 2022-01-15: 130 mg via INTRAVENOUS
  Administered 2022-01-15: 20 mg via INTRAVENOUS

## 2022-01-15 MED ORDER — MIDAZOLAM HCL 2 MG/2ML IJ SOLN
INTRAMUSCULAR | Status: AC
  Filled 2022-01-15: qty 2

## 2022-01-15 MED ORDER — ROCURONIUM BROMIDE 10 MG/ML (PF) SYRINGE
PREFILLED_SYRINGE | INTRAVENOUS | Status: DC | PRN
  Administered 2022-01-15: 60 mg via INTRAVENOUS
  Administered 2022-01-15: 20 mg via INTRAVENOUS

## 2022-01-15 MED ORDER — KETAMINE HCL 50 MG/ML IJ SOLN
INTRAMUSCULAR | Status: DC | PRN
  Administered 2022-01-15: 20 mg via INTRAMUSCULAR
  Administered 2022-01-15: 30 mg via INTRAMUSCULAR

## 2022-01-15 MED ORDER — LACTATED RINGERS IV SOLN: INTRAVENOUS | Status: DC

## 2022-01-15 MED ORDER — ONDANSETRON HCL 4 MG/2ML IJ SOLN
INTRAMUSCULAR | Status: AC
  Filled 2022-01-15: qty 2

## 2022-01-15 MED ORDER — OXYCODONE HCL ER 10 MG PO T12A
10.0000 mg | EXTENDED_RELEASE_TABLET | Freq: Two times a day (BID) | ORAL | Status: DC
  Administered 2022-01-15 – 2022-01-16 (×2): 10 mg via ORAL
  Filled 2022-01-15 (×2): qty 1

## 2022-01-15 MED ORDER — FENTANYL CITRATE (PF) 250 MCG/5ML IJ SOLN
INTRAMUSCULAR | Status: AC
  Filled 2022-01-15: qty 5

## 2022-01-15 MED ORDER — SUMATRIPTAN SUCCINATE 50 MG PO TABS: 50.0000 mg | ORAL_TABLET | ORAL | Status: DC | PRN

## 2022-01-15 MED ORDER — AMISULPRIDE (ANTIEMETIC) 5 MG/2ML IV SOLN
INTRAVENOUS | Status: AC
  Filled 2022-01-15: qty 4

## 2022-01-15 MED ORDER — MECLIZINE HCL 25 MG PO TABS
25.0000 mg | ORAL_TABLET | Freq: Three times a day (TID) | ORAL | Status: DC | PRN
  Filled 2022-01-15: qty 1

## 2022-01-15 MED ORDER — FENTANYL CITRATE (PF) 100 MCG/2ML IJ SOLN
INTRAMUSCULAR | Status: AC
Start: 2022-01-15 — End: 2022-01-16
  Filled 2022-01-15: qty 2

## 2022-01-15 MED ORDER — ROCURONIUM BROMIDE 10 MG/ML (PF) SYRINGE
PREFILLED_SYRINGE | INTRAVENOUS | Status: AC
  Filled 2022-01-15: qty 20

## 2022-01-15 MED ORDER — ORAL CARE MOUTH RINSE: 15.0000 mL | Freq: Once | OROMUCOSAL | Status: AC

## 2022-01-15 MED ORDER — PHENYLEPHRINE 80 MCG/ML (10ML) SYRINGE FOR IV PUSH (FOR BLOOD PRESSURE SUPPORT)
PREFILLED_SYRINGE | INTRAVENOUS | Status: AC
  Filled 2022-01-15: qty 10

## 2022-01-15 MED ORDER — SUGAMMADEX SODIUM 200 MG/2ML IV SOLN
INTRAVENOUS | Status: DC | PRN
  Administered 2022-01-15: 200 mg via INTRAVENOUS

## 2022-01-15 MED ORDER — ACETAMINOPHEN 650 MG RE SUPP: 650.0000 mg | RECTAL | Status: DC | PRN

## 2022-01-15 MED ORDER — LIDOCAINE 2% (20 MG/ML) 5 ML SYRINGE
INTRAMUSCULAR | Status: DC | PRN
  Administered 2022-01-15: 60 mg via INTRAVENOUS

## 2022-01-15 MED ORDER — EPHEDRINE 5 MG/ML INJ
INTRAVENOUS | Status: AC
  Filled 2022-01-15: qty 5

## 2022-01-15 MED ORDER — FLUTICASONE PROPIONATE 50 MCG/ACT NA SUSP
2.0000 | Freq: Every day | NASAL | Status: DC | PRN
  Filled 2022-01-15: qty 16

## 2022-01-15 MED ORDER — OXYCODONE HCL 5 MG PO TABS: 5.0000 mg | ORAL_TABLET | ORAL | Status: DC | PRN

## 2022-01-15 MED ORDER — FENTANYL CITRATE (PF) 100 MCG/2ML IJ SOLN
50.0000 ug | Freq: Once | INTRAMUSCULAR | Status: AC
  Administered 2022-01-15: 50 ug via INTRAVENOUS

## 2022-01-15 MED ORDER — DEXAMETHASONE SODIUM PHOSPHATE 10 MG/ML IJ SOLN
INTRAMUSCULAR | Status: AC
  Filled 2022-01-15: qty 1

## 2022-01-15 MED ORDER — POTASSIUM CHLORIDE IN NACL 20-0.9 MEQ/L-% IV SOLN: INTRAVENOUS | Status: DC

## 2022-01-15 MED ORDER — 0.9 % SODIUM CHLORIDE (POUR BTL) OPTIME
TOPICAL | Status: DC | PRN
  Administered 2022-01-15: 1000 mL

## 2022-01-15 MED ORDER — ONDANSETRON HCL 4 MG/2ML IJ SOLN
INTRAMUSCULAR | Status: DC | PRN
  Administered 2022-01-15: 4 mg via INTRAVENOUS

## 2022-01-15 MED ORDER — PHENYLEPHRINE 80 MCG/ML (10ML) SYRINGE FOR IV PUSH (FOR BLOOD PRESSURE SUPPORT)
PREFILLED_SYRINGE | INTRAVENOUS | Status: AC
  Filled 2022-01-15: qty 20

## 2022-01-15 MED ORDER — DOCUSATE SODIUM 100 MG PO CAPS
200.0000 mg | ORAL_CAPSULE | Freq: Every day | ORAL | Status: DC
  Administered 2022-01-16: 200 mg via ORAL
  Filled 2022-01-15: qty 2

## 2022-01-15 MED ORDER — ZOLPIDEM TARTRATE 5 MG PO TABS: 5.0000 mg | ORAL_TABLET | Freq: Every evening | ORAL | Status: DC | PRN

## 2022-01-15 MED ORDER — KETOROLAC TROMETHAMINE 15 MG/ML IJ SOLN
15.0000 mg | Freq: Four times a day (QID) | INTRAMUSCULAR | Status: DC
  Administered 2022-01-15 – 2022-01-16 (×2): 15 mg via INTRAVENOUS
  Filled 2022-01-15 (×2): qty 1

## 2022-01-15 MED ORDER — LIDOCAINE-EPINEPHRINE 0.5 %-1:200000 IJ SOLN
INTRAMUSCULAR | Status: DC | PRN
  Administered 2022-01-15: 10 mL

## 2022-01-15 MED ORDER — ONDANSETRON HCL 4 MG/2ML IJ SOLN
4.0000 mg | Freq: Four times a day (QID) | INTRAMUSCULAR | Status: DC | PRN
  Administered 2022-01-15: 4 mg via INTRAVENOUS
  Filled 2022-01-15: qty 2

## 2022-01-15 MED ORDER — THROMBIN (RECOMBINANT) 5000 UNITS EX SOLR: CUTANEOUS | Status: DC | PRN

## 2022-01-15 MED ORDER — KETAMINE HCL 50 MG/5ML IJ SOSY
PREFILLED_SYRINGE | INTRAMUSCULAR | Status: AC
  Filled 2022-01-15: qty 5

## 2022-01-15 MED ORDER — ALBUTEROL SULFATE HFA 108 (90 BASE) MCG/ACT IN AERS: 2.0000 | INHALATION_SPRAY | Freq: Four times a day (QID) | RESPIRATORY_TRACT | Status: DC | PRN

## 2022-01-15 MED ORDER — HYDROMORPHONE HCL 1 MG/ML IJ SOLN
0.2500 mg | INTRAMUSCULAR | Status: DC | PRN
  Administered 2022-01-15 (×3): 0.5 mg via INTRAVENOUS

## 2022-01-15 MED ORDER — PROPOFOL 10 MG/ML IV BOLUS
INTRAVENOUS | Status: AC
  Filled 2022-01-15: qty 20

## 2022-01-15 MED ORDER — ALBUTEROL SULFATE (2.5 MG/3ML) 0.083% IN NEBU: 2.5000 mg | INHALATION_SOLUTION | Freq: Four times a day (QID) | RESPIRATORY_TRACT | Status: DC | PRN

## 2022-01-15 MED ORDER — CHLORHEXIDINE GLUCONATE 0.12 % MT SOLN
15.0000 mL | Freq: Once | OROMUCOSAL | Status: AC
  Administered 2022-01-15: 15 mL via OROMUCOSAL
  Filled 2022-01-15: qty 15

## 2022-01-15 MED ORDER — DIAZEPAM 5 MG PO TABS
5.0000 mg | ORAL_TABLET | Freq: Four times a day (QID) | ORAL | Status: DC | PRN
  Administered 2022-01-15: 5 mg via ORAL
  Filled 2022-01-15: qty 1

## 2022-01-15 MED ORDER — GLYCOPYRROLATE PF 0.2 MG/ML IJ SOSY
PREFILLED_SYRINGE | INTRAMUSCULAR | Status: DC | PRN
  Administered 2022-01-15: .1 mg via INTRAVENOUS

## 2022-01-15 MED ORDER — MENTHOL 3 MG MT LOZG: 1.0000 | LOZENGE | OROMUCOSAL | Status: DC | PRN

## 2022-01-15 MED ORDER — MELATONIN 5 MG PO TABS: 5.0000 mg | ORAL_TABLET | Freq: Every evening | ORAL | Status: DC | PRN

## 2022-01-15 MED ORDER — ONDANSETRON HCL 4 MG PO TABS: 4.0000 mg | ORAL_TABLET | Freq: Four times a day (QID) | ORAL | Status: DC | PRN

## 2022-01-15 MED ORDER — VITAMIN D (ERGOCALCIFEROL) 1.25 MG (50000 UNIT) PO CAPS
50000.0000 [IU] | ORAL_CAPSULE | ORAL | Status: DC
  Filled 2022-01-15: qty 1

## 2022-01-15 MED ORDER — FENTANYL CITRATE (PF) 250 MCG/5ML IJ SOLN
INTRAMUSCULAR | Status: DC | PRN
  Administered 2022-01-15 (×5): 50 ug via INTRAVENOUS

## 2022-01-15 MED ORDER — ACETAMINOPHEN 325 MG PO TABS: 650.0000 mg | ORAL_TABLET | ORAL | Status: DC | PRN

## 2022-01-15 SURGICAL SUPPLY — 43 items
ADH SKN CLS APL DERMABOND .7 (GAUZE/BANDAGES/DRESSINGS) ×1
BAG COUNTER SPONGE SURGICOUNT (BAG) ×4 IMPLANT
BAG SPNG CNTER NS LX DISP (BAG) ×2
BAND INSRT 18 STRL LF DISP RB (MISCELLANEOUS) ×2
BAND RUBBER #18 3X1/16 STRL (MISCELLANEOUS) ×6 IMPLANT
BUR MATCHSTICK NEURO 3.0 LAGG (BURR) ×3 IMPLANT
CANISTER SUCT 3000ML PPV (MISCELLANEOUS) ×3 IMPLANT
CARTRIDGE OIL MAESTRO DRILL (MISCELLANEOUS) ×2 IMPLANT
DERMABOND ADVANCED (GAUZE/BANDAGES/DRESSINGS) ×1
DERMABOND ADVANCED .7 DNX12 (GAUZE/BANDAGES/DRESSINGS) ×2 IMPLANT
DEVICE DURAL REPAIR (MISCELLANEOUS) ×2 IMPLANT
DIFFUSER DRILL AIR PNEUMATIC (MISCELLANEOUS) ×3 IMPLANT
DRAPE LAPAROTOMY 100X72X124 (DRAPES) ×3 IMPLANT
DRAPE MICROSCOPE LEICA (MISCELLANEOUS) ×3 IMPLANT
DRSG OPSITE POSTOP 3X4 (GAUZE/BANDAGES/DRESSINGS) ×1 IMPLANT
DURAPREP 26ML APPLICATOR (WOUND CARE) ×3 IMPLANT
ELECT REM PT RETURN 9FT ADLT (ELECTROSURGICAL) ×2
ELECTRODE REM PT RTRN 9FT ADLT (ELECTROSURGICAL) ×2 IMPLANT
GAUZE 4X4 16PLY ~~LOC~~+RFID DBL (SPONGE) IMPLANT
GAUZE SPONGE 4X4 12PLY STRL (GAUZE/BANDAGES/DRESSINGS) IMPLANT
GOWN STRL REUS W/ TWL LRG LVL3 (GOWN DISPOSABLE) ×4 IMPLANT
GOWN STRL REUS W/ TWL XL LVL3 (GOWN DISPOSABLE) IMPLANT
GOWN STRL REUS W/TWL 2XL LVL3 (GOWN DISPOSABLE) IMPLANT
GOWN STRL REUS W/TWL LRG LVL3 (GOWN DISPOSABLE) ×4
GOWN STRL REUS W/TWL XL LVL3 (GOWN DISPOSABLE) ×4
KIT BASIN OR (CUSTOM PROCEDURE TRAY) ×3 IMPLANT
KIT TURNOVER KIT B (KITS) ×3 IMPLANT
NDL HYPO 25X1 1.5 SAFETY (NEEDLE) ×2 IMPLANT
NEEDLE HYPO 25X1 1.5 SAFETY (NEEDLE) ×2 IMPLANT
NS IRRIG 1000ML POUR BTL (IV SOLUTION) ×3 IMPLANT
OIL CARTRIDGE MAESTRO DRILL (MISCELLANEOUS) ×2
PACK LAMINECTOMY NEURO (CUSTOM PROCEDURE TRAY) ×3 IMPLANT
PAD ARMBOARD 7.5X6 YLW CONV (MISCELLANEOUS) ×9 IMPLANT
SEALANT ADHERUS EXTEND TIP (MISCELLANEOUS) ×1 IMPLANT
SPONGE SURGIFOAM ABS GEL SZ50 (HEMOSTASIS) ×3 IMPLANT
STRIP CLOSURE SKIN 1/2X4 (GAUZE/BANDAGES/DRESSINGS) IMPLANT
SUT VIC AB 0 CT1 18XCR BRD8 (SUTURE) ×2 IMPLANT
SUT VIC AB 0 CT1 8-18 (SUTURE) ×2
SUT VIC AB 2-0 CT1 18 (SUTURE) ×3 IMPLANT
SUT VIC AB 3-0 SH 8-18 (SUTURE) ×3 IMPLANT
TOWEL GREEN STERILE (TOWEL DISPOSABLE) ×3 IMPLANT
TOWEL GREEN STERILE FF (TOWEL DISPOSABLE) ×3 IMPLANT
WATER STERILE IRR 1000ML POUR (IV SOLUTION) ×3 IMPLANT

## 2022-01-15 NOTE — Anesthesia Postprocedure Evaluation (Signed)
Anesthesia Post Note  Patient: Kaylee Miller  Procedure(s) Performed: Right Lumbar Five-Sacral One Microdiscectomy (Right: Spine Lumbar)     Patient location during evaluation: PACU Anesthesia Type: General Level of consciousness: awake and alert Pain management: pain level controlled Vital Signs Assessment: post-procedure vital signs reviewed and stable Respiratory status: spontaneous breathing, nonlabored ventilation, respiratory function stable and patient connected to nasal cannula oxygen Cardiovascular status: blood pressure returned to baseline and stable Postop Assessment: no apparent nausea or vomiting Anesthetic complications: no   No notable events documented.  Last Vitals:  Vitals:   01/15/22 2020 01/15/22 2036  BP:  114/62  Pulse: 68 78  Resp: 17 18  Temp:  36.6 C  SpO2: 95% 98%    Last Pain:  Vitals:   01/15/22 2116  TempSrc:   PainSc: 7                  Kaylee Miller

## 2022-01-15 NOTE — Anesthesia Preprocedure Evaluation (Addendum)
Anesthesia Evaluation  Patient identified by MRN, date of birth, ID band Patient awake    Reviewed: Allergy & Precautions, H&P , NPO status , Patient's Chart, lab work & pertinent test results  Airway Mallampati: II  TM Distance: >3 FB Neck ROM: Full    Dental no notable dental hx. (+) Teeth Intact, Dental Advisory Given   Pulmonary asthma , Current SmokerPatient did not abstain from smoking.,    Pulmonary exam normal breath sounds clear to auscultation       Cardiovascular hypertension,  Rhythm:Regular Rate:Normal     Neuro/Psych  Headaches, Anxiety Depression    GI/Hepatic Neg liver ROS, GERD  Controlled,  Endo/Other  negative endocrine ROS  Renal/GU negative Renal ROS  negative genitourinary   Musculoskeletal   Abdominal   Peds  Hematology negative hematology ROS (+)   Anesthesia Other Findings   Reproductive/Obstetrics negative OB ROS                            Anesthesia Physical Anesthesia Plan  ASA: 2  Anesthesia Plan: General   Post-op Pain Management: Tylenol PO (pre-op)*   Induction: Intravenous  PONV Risk Score and Plan: 3 and Ondansetron, Dexamethasone and Midazolam  Airway Management Planned: Oral ETT  Additional Equipment:   Intra-op Plan:   Post-operative Plan: Extubation in OR  Informed Consent: I have reviewed the patients History and Physical, chart, labs and discussed the procedure including the risks, benefits and alternatives for the proposed anesthesia with the patient or authorized representative who has indicated his/her understanding and acceptance.     Dental advisory given  Plan Discussed with: CRNA  Anesthesia Plan Comments:         Anesthesia Quick Evaluation

## 2022-01-15 NOTE — Op Note (Signed)
01/15/2022  7:52 PM  PATIENT:  Kaylee Miller  31 y.o. female  PRE-OPERATIVE DIAGNOSIS:  Disc displacement, Lumbar 5/1 right recurrence  POST-OPERATIVE DIAGNOSIS:  Disc displacement, Lumbar 5/S1 right side, recurrence  PROCEDURE:  Procedure(s): Right redo Lumbar Five-Sacral One Microdiscectomy  SURGEON:   Surgeon(s): Ashok Pall, MD  ASSISTANTS:none   ANESTHESIA:   local and general  EBL:  Total I/O In: 200 [I.V.:200] Out: 350 [Urine:350]  BLOOD ADMINISTERED:none  CELL SAVER GIVEN:not applicable  COUNT:per nursing  DRAINS: none   SPECIMEN:  No Specimen  DICTATION: [name] was taken to the operating room, intubated and placed under a general anesthetic without difficulty. She was positioned prone on a Wilson frame with all pressure points padded. Her back was prepped and draped in a sterile manner. I opened the skin with a 10 blade and carried the dissection down to the thoracolumbar fascia. I used both sharp dissection and the monopolar cautery to expose the lamina of L5, and S1. I confirmed my location with an intraoperative xray.  I used the drill, Kerrison punches, and curettes to perform a semihemilaminectomy of L5. I used the punches to remove the ligamentum flavum to expose the thecal sac. I brought the microscope into the operative field to allow for microdissection. I started the decompression of the spinal canal, thecal sac and S1, and L5 root(s). I with the dissection caused a durotomy. I closed this primarily and continued.  I opened the disc space with a double ended ganglion knife and proceeded with the discectomy. I used pituitary rongeurs, curettes, and other instruments to remove disc material. After the discectomy was completed I inspected the S1 nerve root and felt it was well decompressed. I explored rostrally, laterally, medially, and caudally and was satisfied with the decompression. I irrigated the wound, then closed in layers. I approximated the  thoracolumbar fascia, subcutaneous, and subcuticular planes with vicryl sutures. I used dermabond for a sterile dressing.   PLAN OF CARE: Admit to inpatient   PATIENT DISPOSITION:  PACU - hemodynamically stable.   Delay start of Pharmacological VTE agent (>24hrs) due to surgical blood loss or risk of bleeding:  yes

## 2022-01-15 NOTE — H&P (Signed)
LMP 01/03/2022  Kaylee Miller returns today.  She has been in severe pain over the last 2 weeks or so.  The pain is in the right lower extremity, searing, unrelenting, and worse than what she had had the first time we did an operation.  She was denied an MRI and was told that she had to wait 6 weeks.  This was not something that she could do.  She went to the emergency room and an MRI was done there.  She returns today and has a recurrent disc at L5-S1 eccentric to the right side.  This is the reason for her pain.  She needs to go back to the operating room.  She is weak in the plantarflexors.  She is unable to stand or sit still.  She is continually rubbing her legs.  She is short of breath.  She just hurts that much.  She has made 3 trips to the emergency room in the last 7 days. Allergies  Allergen Reactions   Latex Swelling   Family History  Problem Relation Age of Onset   Cancer Mother    Breast cancer Mother    Hypertension Father    Stroke Father    CAD Maternal Grandfather    CAD Paternal Grandmother    CAD Paternal Grandfather    Diabetes Other        great-great grandmother   CAD Other    Social History   Socioeconomic History   Marital status: Single    Spouse name: Not on file   Number of children: Not on file   Years of education: Not on file   Highest education level: Not on file  Occupational History   Not on file  Tobacco Use   Smoking status: Every Day    Packs/day: 0.50    Years: 9.00    Pack years: 4.50    Types: Cigarettes    Passive exposure: Never   Smokeless tobacco: Never  Vaping Use   Vaping Use: Never used  Substance and Sexual Activity   Alcohol use: Yes    Comment: Occa   Drug use: Never   Sexual activity: Not Currently    Birth control/protection: Injection  Other Topics Concern   Not on file  Social History Narrative   Not on file   Social Determinants of Health   Financial Resource Strain: Not on file  Food Insecurity: Not on file   Transportation Needs: Not on file  Physical Activity: Not on file  Stress: Not on file  Social Connections: Not on file  Intimate Partner Violence: Not on file   Prior to Admission medications   Medication Sig Start Date End Date Taking? Authorizing Provider  aspirin-acetaminophen-caffeine (EXCEDRIN MIGRAINE) [phone] MG tablet Take 2 tablets by mouth every 6 (six) hours as needed for migraine.   Yes [provider]  docusate sodium (COLACE) 100 MG capsule Take 200 mg by mouth daily.   Yes [provider]  Doxylamine-DM (VICKS DAYQUIL/NYQUIL COUGH PO) Take 1 Dose by mouth 2 (two) times daily as needed (cold symptoms).   Yes [provider]  escitalopram (LEXAPRO) 10 MG tablet Take 1 tablet (10 mg total) by mouth daily. 10/29/21 10/29/22 Yes Estill Dooms, NP  HYDROcodone-acetaminophen (NORCO/VICODIN) 5-325 MG tablet Take 1 tablet by mouth every 6 (six) hours as needed for severe pain. 01/07/22  Yes Lindell Spar, MD  ibuprofen (ADVIL) 200 MG tablet Take 800 mg by mouth every 6 (six) hours as needed  for cramping.   Yes [provider]  Melatonin 5 MG CAPS Take 5 mg by mouth at bedtime as needed (sleep).   Yes [provider]  naproxen (NAPROSYN) 500 MG tablet Take 1 tablet (500 mg total) by mouth 2 (two) times daily as needed. Do not take while taking prednisone 12/05/21  Yes Volney American, PA-C  ondansetron (ZOFRAN ODT) 4 MG disintegrating tablet Take 1 tablet (4 mg total) by mouth every 8 (eight) hours as needed for nausea or vomiting. 12/19/21  Yes Estill Dooms, NP  predniSONE (DELTASONE) 10 MG tablet Take 4 tablets (40 mg total) by mouth daily. 01/05/22  Yes Fredia Sorrow, MD  tiZANidine (ZANAFLEX) 4 MG tablet Take 1 tablet (4 mg total) by mouth every 6 (six) hours as needed for muscle spasms. 09/13/21  Yes Lindell Spar, MD  Vitamin D, Ergocalciferol, (DRISDOL) 1.25 MG (50000 UNIT) CAPS capsule Take 1 capsule (50,000 Units  total) by mouth every 7 (seven) days. 12/19/20  Yes Lindell Spar, MD  albuterol (VENTOLIN HFA) 108 (90 Base) MCG/ACT inhaler Inhale 2 puffs into the lungs every 6 (six) hours as needed for wheezing or shortness of breath. 06/10/20   Rolland Porter, MD  fluticasone (FLONASE) 50 MCG/ACT nasal spray Place 2 sprays into both nostrils daily as needed for allergies or rhinitis.    [provider]  meclizine (ANTIVERT) 25 MG tablet Take 1 tablet (25 mg total) by mouth 3 (three) times daily as needed for dizziness. 01/11/21   Raspet, Derry Skill, PA-C  medroxyPROGESTERone Acetate 150 MG/ML SUSY Inject 1 mL (150 mg total) into the muscle every 3 (three) months. INJECT 1 ML INTO THE MUSCLE EVERY 3 MONTHS Strength: 150 mg/mL 08/21/21   Derrek Monaco A, NP  SUMAtriptan (IMITREX) 50 MG tablet Take 50 mg by mouth every 2 (two) hours as needed for migraine. 05/17/21   [provider]   Past Medical History:  Diagnosis Date   Anemia    Anxiety    Asthma    Phreesia 07/02/2020   Contraceptive management 02/08/2015   COVID    mild case   Depression    GERD (gastroesophageal reflux disease)    Headache(784.0)    migraines   History of bronchitis    HSV-2 (herpes simplex virus 2) infection    Pneumonia    Pregnant    S/P C-section 07/28/2013   Sciatic nerve pain    Seasonal allergies    Vaginal Pap smear, abnormal    Past Surgical History:  Procedure Laterality Date   adenoids     CESAREAN SECTION  2007   CESAREAN SECTION N/A 07/28/2013   Procedure: CESAREAN SECTION;  Surgeon: Jonnie Kind, MD;  Location: [facility];  Service: Obstetrics;  Laterality: N/A;   LUMBAR LAMINECTOMY/DECOMPRESSION MICRODISCECTOMY Right 05/25/2021   Procedure: DISCECTOMY Lumbar five - Sacral one;  Surgeon: Ashok Pall, MD;  Location: [facility];  Service: Neurosurgery;  Laterality: Right;   tubes in ears         PHYSICAL EXAM :  She is 5 feet 7 inches, weighs 202 pounds.  Temperature is 98.3, blood pressure  115/73, pulse 83, pain is 10/10.     IMAGING :  MRI shows a clear-cut recurrence.     ASSESSMENT AND PLAN :  Kaylee Miller needs another operation.  She does not need to be fused, just on the right side at L5-S1.  The rest of the spine looks good.  We will try  to get this done as soon as possible.  She does have weakness and objective signs of the disc recurrence.

## 2022-01-15 NOTE — Anesthesia Procedure Notes (Signed)
Procedure Name: Intubation Date/Time: 01/15/2022 5:00 PM Performed by: Alain Marion, CRNA Pre-anesthesia Checklist: Patient identified, Emergency Drugs available, Suction available and Patient being monitored Patient Re-evaluated:Patient Re-evaluated prior to induction Oxygen Delivery Method: Circle system utilized Preoxygenation: Pre-oxygenation with 100% oxygen Induction Type: IV induction Ventilation: Mask ventilation without difficulty Laryngoscope Size: Miller and 2 Grade View: Grade I Tube type: Oral Tube size: 7.0 mm Number of attempts: 1 Airway Equipment and Method: Stylet and Oral airway Placement Confirmation: ETT inserted through vocal cords under direct vision, positive ETCO2 and breath sounds checked- equal and bilateral Secured at: 21 cm Tube secured with: Tape Dental Injury: Teeth and Oropharynx as per pre-operative assessment

## 2022-01-15 NOTE — Pre-Procedure Instructions (Signed)
Pts father updated about surgical delay and aware pt still in preop. Pt states she was updating him until this time and her phone battery died.

## 2022-01-15 NOTE — Transfer of Care (Signed)
Immediate Anesthesia Transfer of Care Note  Patient: Kaylee Miller  Procedure(s) Performed: Right Lumbar Five-Sacral One Microdiscectomy (Right: Spine Lumbar)  Patient Location: PACU  Anesthesia Type:General  Level of Consciousness: awake, alert  and oriented  Airway & Oxygen Therapy: Patient Spontanous Breathing and Patient connected to face mask oxygen  Post-op Assessment: Report given to RN and Post -op Vital signs reviewed and stable  Post vital signs: Reviewed and stable  Last Vitals:  Vitals Value Taken Time  BP    Temp    Pulse 27 01/15/22 1930  Resp 15 01/15/22 1930  SpO2 89 % 01/15/22 1930  Vitals shown include unvalidated device data.  Last Pain:  Vitals:   01/15/22 1559  TempSrc:   PainSc: 10-Worst pain ever      Patients Stated Pain Goal: 3 (41/58/30 9407)  Complications: No notable events documented.

## 2022-01-16 ENCOUNTER — Encounter (HOSPITAL_COMMUNITY): Payer: Self-pay | Admitting: Neurosurgery

## 2022-01-16 DIAGNOSIS — M5127 Other intervertebral disc displacement, lumbosacral region: Secondary | ICD-10-CM | POA: Diagnosis not present

## 2022-01-16 MED ORDER — MUPIROCIN 2 % EX OINT
1.0000 "application " | TOPICAL_OINTMENT | Freq: Two times a day (BID) | CUTANEOUS | Status: DC
  Administered 2022-01-16 (×2): 1 via NASAL
  Filled 2022-01-16: qty 22

## 2022-01-16 MED ORDER — TIZANIDINE HCL 4 MG PO TABS: 4.0000 mg | ORAL_TABLET | Freq: Four times a day (QID) | ORAL | 0 refills | Status: DC | PRN

## 2022-01-16 MED ORDER — CALCIUM CARBONATE ANTACID 500 MG PO CHEW
400.0000 mg | CHEWABLE_TABLET | Freq: Two times a day (BID) | ORAL | Status: DC | PRN
  Administered 2022-01-16 (×2): 400 mg via ORAL
  Filled 2022-01-16 (×2): qty 2

## 2022-01-16 MED FILL — Thrombin For Soln 5000 Unit: CUTANEOUS | Qty: 2 | Status: AC

## 2022-01-16 NOTE — Discharge Summary (Signed)
Physician Discharge Summary  Patient ID: Kaylee Miller MRN: [id number] DOB/AGE: 30-Jan-1991 31 y.o.  Admit date: 01/15/2022 Discharge date: 01/16/2022  Admission Diagnoses:HNP lumbar 5/1  Discharge Diagnoses: same Principal Problem:   HNP (herniated nucleus pulposus), lumbar   Discharged Condition: good  Hospital Course: Kaylee Miller was admitted and taken to the operating room . A recurrent disc herniation was removed. Post op she is alert and oriented, with full movement and strength in the lower extremities. Wound is clean, and dry  Treatments: surgery: discetomy L5/S1 right   Discharge Exam: Blood pressure 122/66, pulse 77, temperature 99.3 F (37.4 C), temperature source Oral, resp. rate 18, height '5\' 7"'$  (1.702 m), weight 92.1 kg, last menstrual period 01/03/2022, SpO2 99 %. General appearance: alert, cooperative, appears stated age, and no distress  Disposition: Discharge disposition: 01-Home or Self Care      Disc displacement, Lumbar Discharge Instructions     Incentive spirometry RT   Complete by: As directed       Allergies as of 01/16/2022       Reactions   Latex Swelling        Medication List     TAKE these medications    albuterol 108 (90 Base) MCG/ACT inhaler Commonly known as: VENTOLIN HFA Inhale 2 puffs into the lungs every 6 (six) hours as needed for wheezing or shortness of breath.   aspirin-acetaminophen-caffeine 250-250-65 MG tablet Commonly known as: EXCEDRIN MIGRAINE Take 2 tablets by mouth every 6 (six) hours as needed for migraine.   docusate sodium 100 MG capsule Commonly known as: COLACE Take 200 mg by mouth daily.   escitalopram 10 MG tablet Commonly known as: Lexapro Take 1 tablet (10 mg total) by mouth daily.   fluticasone 50 MCG/ACT nasal spray Commonly known as: FLONASE Place 2 sprays into both nostrils daily as needed for allergies or rhinitis.   HYDROcodone-acetaminophen 5-325 MG tablet Commonly known as:  NORCO/VICODIN Take 1 tablet by mouth every 6 (six) hours as needed for severe pain.   ibuprofen 200 MG tablet Commonly known as: ADVIL Take 800 mg by mouth every 6 (six) hours as needed for cramping.   meclizine 25 MG tablet Commonly known as: ANTIVERT Take 1 tablet (25 mg total) by mouth 3 (three) times daily as needed for dizziness.   medroxyPROGESTERone Acetate 150 MG/ML Susy Inject 1 mL (150 mg total) into the muscle every 3 (three) months. INJECT 1 ML INTO THE MUSCLE EVERY 3 MONTHS Strength: 150 mg/mL   Melatonin 5 MG Caps Take 5 mg by mouth at bedtime as needed (sleep).   naproxen 500 MG tablet Commonly known as: NAPROSYN Take 1 tablet (500 mg total) by mouth 2 (two) times daily as needed. Do not take while taking prednisone   ondansetron 4 MG disintegrating tablet Commonly known as: Zofran ODT Take 1 tablet (4 mg total) by mouth every 8 (eight) hours as needed for nausea or vomiting.   predniSONE 10 MG tablet Commonly known as: DELTASONE Take 4 tablets (40 mg total) by mouth daily.   SUMAtriptan 50 MG tablet Commonly known as: IMITREX Take 50 mg by mouth every 2 (two) hours as needed for migraine.   tiZANidine 4 MG tablet Commonly known as: Zanaflex Take 1 tablet (4 mg total) by mouth every 6 (six) hours as needed for muscle spasms. What changed: Another medication with the same name was added. Make sure you understand how and when to take each.   tiZANidine 4 MG tablet Commonly known  as: ZANAFLEX Take 1 tablet (4 mg total) by mouth every 6 (six) hours as needed for muscle spasms. What changed: You were already taking a medication with the same name, and this prescription was added. Make sure you understand how and when to take each.   VICKS DAYQUIL/NYQUIL COUGH PO Take 1 Dose by mouth 2 (two) times daily as needed (cold symptoms).   Vitamin D (Ergocalciferol) 1.25 MG (50000 UNIT) Caps capsule Commonly known as: DRISDOL Take 1 capsule (50,000 Units total) by  mouth every 7 (seven) days.        Follow-up Information     Ashok Pall, MD Follow up.   Specialty: Neurosurgery Why: keep your scheduled appointment Contact information: [address] [place] [address] [phone]                 Signed: Ashok Pall 01/16/2022, 2:25 PM

## 2022-01-16 NOTE — Plan of Care (Signed)
Pt given D/C instructions with verbal understanding. Rx was given to Pt at D/C. Pt's incision is clean and dry with no sign of infection. Pt's IV was removed prior to D/C. Pt received RW from [employer] per MD order. Pt D/C'd home via wheelchair per MD order. Pt is stable @ D/C and has no other needs at this time. Holli Humbles, RN

## 2022-01-16 NOTE — Progress Notes (Signed)
  Transition of Care [facility]) Screening Note   Patient Details  Name: Kaylee Miller Date of Birth: [birth year]   Transition of Care [facility]) CM/SW Contact:    Geralynn Ochs, LCSW Phone Number: 01/16/2022, 9:47 AM    Transition of Care Department [facility]) has reviewed patient and no TOC needs have been identified at this time. We will continue to monitor patient advancement through interdisciplinary progression rounds. If new patient transition needs arise, please place a TOC consult.

## 2022-01-16 NOTE — Discharge Instructions (Signed)
Lumbar Discectomy °Care After °A discectomy involves removal of discmaterial (the cartilage-like structures located between the bones of the back). It is done to relieve pressure on nerve roots. It can be used as a treatment for a back problem. The time in surgery depends on the findings in surgery and what is necessary to correct the problems. °HOME CARE INSTRUCTIONS  °· Check the cut (incision) made by the surgeon twice a day for signs of infection. Some signs of infection may include:  °· A foul smelling, greenish or yellowish discharge from the wound.  °· Increased pain.  °· Increased redness over the incision (operative) site.  °· The skin edges may separate.  °· Flu-like symptoms (problems).  °· A temperature above 101.5° F (38.6° C).  °· Change your bandages in about 24 to 36 hours following surgery or as directed.  °· You may shower tomrrow.  Avoid bathtubs, swimming pools and hot tubs for three weeks or until your incision has healed completely. °· Follow your doctor's instructions as to safe activities, exercises, and physical therapy.  °· Weight reduction may be beneficial if you are overweight.  °· Daily exercise is helpful to prevent the return of problems. Walking is permitted. You may use a treadmill without an incline. Cut down on activities and exercise if you have discomfort. You may also go up and down stairs as much as you can tolerate.  °· DO NOT lift anything heavier than 10 to 15 lbs. Avoid bending or twisting at the waist. Always bend your knees when lifting.  °· Maintain strength and range of motion as instructed.  °· Do not drive for 10 days, or as directed by your doctors. You may be a passenger . Lying back in the passenger seat may be more comfortable for you. Always wear a seatbelt.  °· Limit your sitting in a regular chair to 20 to 30 minutes at a time. There are no limitations for sitting in a recliner. You should lie down or walk in between sitting periods.  °· Only take  over-the-counter or prescription medicines for pain, discomfort, or fever as directed by your caregiver.  °SEEK MEDICAL CARE IF:  °· There is increased bleeding (more than a small spot) from the wound.  °· You notice redness, swelling, or increasing pain in the wound.  °· Pus is coming from wound.  °· You develop an unexplained oral temperature above 102° F (38.9° C) develops.  °· You notice a foul smell coming from the wound or dressing.  °· You have increasing pain in your wound.  °SEEK IMMEDIATE MEDICAL CARE IF:  °· You develop a rash.  °· You have difficulty breathing.  °· You develop any allergic problems to medicines given.  °Document Released: 07/16/2004 Document Revised: 07/31/2011 Document Reviewed: 11/04/2007 °ExitCare® Patient Information °

## 2022-01-17 ENCOUNTER — Telehealth: Payer: Self-pay

## 2022-01-17 NOTE — Telephone Encounter (Signed)
Transition Care Management Follow-up Telephone Call Date of discharge and from where: 01/16/2022 from [place] How have you been since you were released from the hospital? Patient stated she is sore but all and all okay. Patient did not have any questions or concerns at this time.  Any questions or concerns? No  Items Reviewed: Did the pt receive and understand the discharge instructions provided? Yes  Medications obtained and verified? Yes  Other? No  Any new allergies since your discharge? No  Dietary orders reviewed? No Do you have support at home? Yes   Functional Questionnaire: (I = Independent and D = Dependent) ADLs: I - Lumbar surgery limits movement but patient is able to move.   Bathing/Dressing- I  Meal Prep- I  Eating- I  Maintaining continence- I  Transferring/Ambulation- I  Managing Meds- I   Follow up appointments reviewed:  PCP Hospital f/u appt confirmed? No   Specialist Hospital f/u appt confirmed? Yes  Follow up with Neurosurgeon.  Are transportation arrangements needed? No  If their condition worsens, is the pt aware to call PCP or go to the Emergency Dept.? Yes Was the patient provided with contact information for the PCP's office or ED? Yes Was to pt encouraged to call back with questions or concerns? Yes

## 2022-01-21 ENCOUNTER — Encounter: Payer: Self-pay | Admitting: Adult Health

## 2022-01-21 ENCOUNTER — Ambulatory Visit: Payer: Medicaid Other | Admitting: Adult Health

## 2022-01-21 VITALS — BP 134/86 | HR 82 | Ht 67.0 in | Wt 201.0 lb

## 2022-01-21 DIAGNOSIS — F32A Depression, unspecified: Secondary | ICD-10-CM

## 2022-01-21 DIAGNOSIS — F419 Anxiety disorder, unspecified: Secondary | ICD-10-CM

## 2022-01-21 DIAGNOSIS — Z3042 Encounter for surveillance of injectable contraceptive: Secondary | ICD-10-CM

## 2022-01-21 MED ORDER — ESCITALOPRAM OXALATE 10 MG PO TABS: 10.0000 mg | ORAL_TABLET | Freq: Every day | ORAL | 3 refills | Status: DC

## 2022-01-21 MED ORDER — MEDROXYPROGESTERONE ACETATE 150 MG/ML IM SUSP
150.0000 mg | Freq: Once | INTRAMUSCULAR | Status: AC
  Administered 2022-01-21: 150 mg via INTRAMUSCULAR

## 2022-01-21 NOTE — Progress Notes (Signed)
Subjective:     Patient ID: Kaylee Miller, female   DOB: [birth year], 31 y.o.   MRN: [id number]  HPI Kaylee Miller is a 31 year old black female,single, G2P2 back in follow up on lexapro and to get depo. She had back surgery 01/15/22 with Dr Christella Noa, this is her second back surgery.  Lab Results  Component Value Date   DIAGPAP (A) 09/18/2020    - Atypical squamous cells of undetermined significance (ASC-US)   HPVHIGH Negative 09/18/2020   PCP is Dr Posey Pronto  Review of Systems Stopped lexapro due to pain meds, but will resume, feels better Has some pain still in back and butt Reviewed past medical,surgical, social and family history. Reviewed medications and allergies.     Objective:   Physical Exam BP 134/86 (BP Location: Right Arm, Patient Position: Sitting, Cuff Size: Large)   Pulse 82   Ht '5\' 7"'$  (1.702 m)   Wt 201 lb (91.2 kg)   LMP 01/03/2022   BMI 31.48 kg/m     Skin warm and dry.Lungs: clear to ausculation bilaterally. Cardiovascular: regular rate and rhythm.     01/21/2022   10:25 AM 01/07/2022   11:24 AM 10/29/2021    9:34 AM  Depression screen PHQ 2/9  Decreased Interest 0 0 1  Down, Depressed, Hopeless 0 0 1  PHQ - 2 Score 0 0 2  Altered sleeping 1  3  Tired, decreased energy 1  3  Change in appetite 1  3  Feeling bad or failure about yourself  0  0  Trouble concentrating 0  0  Moving slowly or fidgety/restless 0  0  Suicidal thoughts 0  0  PHQ-9 Score 3  11       01/21/2022   10:26 AM 10/29/2021    9:34 AM 09/18/2020    9:54 AM  GAD 7 : Generalized Anxiety Score  Nervous, Anxious, on Edge 1 1 0  Control/stop worrying 0 1 0  Worry too much - different things '1 3 2  '$ Trouble relaxing '1 1 2  '$ Restless 0 3 0  Easily annoyed or irritable '1 3 2  '$ Afraid - awful might happen 0 1 0  Total GAD 7 Score '4 13 6    '$ Upstream - 01/21/22 1031       Pregnancy Intention Screening   Does the patient want to become pregnant in the next year? No    Does the patient's partner  want to become pregnant in the next year? No    Would the patient like to discuss contraceptive options today? No      Contraception Wrap Up   Current Method Hormonal Injection    End Method Hormonal Injection    Contraception Counseling Provided No             Assessment:     1. Depo-Provera contraceptive status Received depo in office today   2. Anxiety and depression Will resume lexapro now Does feel better  Meds ordered this encounter  Medications   medroxyPROGESTERone (DEPO-PROVERA) injection 150 mg   escitalopram (LEXAPRO) 10 MG tablet    Sig: Take 1 tablet (10 mg total) by mouth daily.    Dispense:  30 tablet    Refill:  3    Order Specific Question:   Supervising Provider    Answer:   Tania Ade H [[id number]]    Follow up in 12 weeks for ROS  3. Encounter for surveillance of injectable contraceptive Depo in 12 weeks  Plan:     Follow up in 12 weeks

## 2022-01-24 ENCOUNTER — Other Ambulatory Visit: Payer: Self-pay | Admitting: Internal Medicine

## 2022-01-25 ENCOUNTER — Ambulatory Visit
Admission: EM | Admit: 2022-01-25 | Discharge: 2022-01-25 | Disposition: A | Discharge status: Home / Self Care | Payer: Medicaid Other | Source: Ambulatory Visit | Attending: Family Medicine | Admitting: Family Medicine

## 2022-01-25 ENCOUNTER — Ambulatory Visit
Admission: RE | Admit: 2022-01-25 | Discharge: 2022-01-25 | Disposition: A | Discharge status: Home / Self Care | Payer: Medicaid Other | Source: Ambulatory Visit

## 2022-01-25 DIAGNOSIS — R110 Nausea: Secondary | ICD-10-CM | POA: Diagnosis not present

## 2022-01-25 DIAGNOSIS — R519 Headache, unspecified: Secondary | ICD-10-CM

## 2022-01-25 MED ORDER — DEXAMETHASONE SODIUM PHOSPHATE 10 MG/ML IJ SOLN
10.0000 mg | Freq: Once | INTRAMUSCULAR | Status: AC
  Administered 2022-01-25: 10 mg via INTRAMUSCULAR

## 2022-01-25 MED ORDER — SUMATRIPTAN SUCCINATE 6 MG/0.5ML ~~LOC~~ SOLN
6.0000 mg | Freq: Once | SUBCUTANEOUS | Status: AC
  Administered 2022-01-25: 6 mg via SUBCUTANEOUS

## 2022-01-25 NOTE — ED Triage Notes (Signed)
Pt states that a week ago she had back surgery  Pt states she was having headaches before the surgery and they stopped because she was on so many meds  Pt states that this week the headaches came back more extreme than before the surgery  Pt states the headaches start from the back of her head and come up to the front of her head

## 2022-01-25 NOTE — ED Provider Notes (Signed)
[facility]    CSN: [id number] Arrival date & time: 01/25/22  0853      History   Chief Complaint Chief Complaint  Patient presents with   Dizziness    Headaches and dizziness    HPI Kaylee Miller is a 31 y.o. female.   Presenting today with waxing and waning headaches that have been present for several weeks but worse the last few days.  States she had a lumbar discectomy for a herniated disc about a week ago and that the medications that she was on postop helped resolve the headache temporarily but now they seem worse than before.  States the pain shoots from base of neck all the way up to forehead, worse with certain movements.  Currently associated with light sensitivity, nausea, vomiting and states it feels like sharp shooting pains shooting around her scalp.  Denies dizziness, head injury, syncope, fever, chills, upper respiratory symptoms, extremity weakness, numbness.  Taking oxycodone, tizanidine with no relief.   Past Medical History:  Diagnosis Date   Anemia    Anxiety    Asthma    Phreesia 07/02/2020   Contraceptive management 02/08/2015   COVID    mild case   Depression    GERD (gastroesophageal reflux disease)    Headache(784.0)    migraines   History of bronchitis    HSV-2 (herpes simplex virus 2) infection    Pneumonia    Pregnant    S/P C-section 07/28/2013   Sciatic nerve pain    Seasonal allergies    Vaginal Pap smear, abnormal     Patient Active Problem List   Diagnosis Date Noted   Encounter for surveillance of injectable contraceptive 01/21/2022   Depo-Provera contraceptive status 01/21/2022   HNP (herniated nucleus pulposus), lumbar 01/15/2022   Anxiety and depression 10/29/2021   Leg swelling 09/13/2021   Body mass index (BMI) 30.0-30.9, adult 08/27/2021   Herniated nucleus pulposus, L5-S1, right 05/24/2021   Right hip pain 04/05/2021   Vaginal irritation 01/09/2021   Urinary frequency 01/09/2021   Spinal stenosis of  lumbar region with neurogenic claudication 12/19/2020   Disc displacement, lumbar 11/12/2020   Essential (primary) hypertension 11/12/2020   ASCUS of cervix with negative high risk HPV 09/21/2020   Sciatica 08/30/2020   Mild asthma without complication 41/96/2229   Tobacco abuse 07/03/2020   Migraines 01/05/2013    Past Surgical History:  Procedure Laterality Date   adenoids     BACK SURGERY     CESAREAN SECTION  08/25/2005   CESAREAN SECTION N/A 07/28/2013   Procedure: CESAREAN SECTION;  Surgeon: Jonnie Kind, MD;  Location: [facility];  Service: Obstetrics;  Laterality: N/A;   LUMBAR LAMINECTOMY/DECOMPRESSION MICRODISCECTOMY Right 05/25/2021   Procedure: DISCECTOMY Lumbar five - Sacral one;  Surgeon: Ashok Pall, MD;  Location: [facility];  Service: Neurosurgery;  Laterality: Right;   LUMBAR LAMINECTOMY/DECOMPRESSION MICRODISCECTOMY Right 01/15/2022   Procedure: Right Lumbar Five-Sacral One Microdiscectomy;  Surgeon: Ashok Pall, MD;  Location: [facility];  Service: Neurosurgery;  Laterality: Right;  3C/RM 19   tubes in ears      OB History     Gravida  2   Para  2   Term  2   Preterm      AB      Living  2      SAB      IAB      Ectopic      Multiple      Live Births  2            Home Medications    Prior to Admission medications   Medication Sig Start Date End Date Taking? Authorizing Provider  albuterol (VENTOLIN HFA) 108 (90 Base) MCG/ACT inhaler Inhale 2 puffs into the lungs every 6 (six) hours as needed for wheezing or shortness of breath. 06/10/20   Rolland Porter, MD  aspirin-acetaminophen-caffeine (EXCEDRIN MIGRAINE) [phone] MG tablet Take 2 tablets by mouth every 6 (six) hours as needed for migraine. Patient not taking: Reported on 01/21/2022    [provider]  docusate sodium (COLACE) 100 MG capsule Take 200 mg by mouth daily.    [provider]  Doxylamine-DM (VICKS DAYQUIL/NYQUIL COUGH PO) Take 1 Dose by mouth 2 (two) times  daily as needed (cold symptoms). Patient not taking: Reported on 01/21/2022    [provider]  escitalopram (LEXAPRO) 10 MG tablet Take 1 tablet (10 mg total) by mouth daily. 01/21/22 01/21/23  Estill Dooms, NP  fluticasone (FLONASE) 50 MCG/ACT nasal spray Place 2 sprays into both nostrils daily as needed for allergies or rhinitis.    [provider]  HYDROcodone-acetaminophen (NORCO/VICODIN) 5-325 MG tablet Take 1 tablet by mouth every 6 (six) hours as needed for severe pain. 01/07/22   Lindell Spar, MD  ibuprofen (ADVIL) 200 MG tablet Take 800 mg by mouth every 6 (six) hours as needed for cramping. Patient not taking: Reported on 01/21/2022    [provider]  meclizine (ANTIVERT) 25 MG tablet Take 1 tablet (25 mg total) by mouth 3 (three) times daily as needed for dizziness. 01/11/21   Raspet, Derry Skill, PA-C  medroxyPROGESTERone Acetate 150 MG/ML SUSY Inject 1 mL (150 mg total) into the muscle every 3 (three) months. INJECT 1 ML INTO THE MUSCLE EVERY 3 MONTHS Strength: 150 mg/mL 08/21/21   Derrek Monaco A, NP  Melatonin 5 MG CAPS Take 5 mg by mouth at bedtime as needed (sleep).    [provider]  naproxen (NAPROSYN) 500 MG tablet Take 1 tablet (500 mg total) by mouth 2 (two) times daily as needed. Do not take while taking prednisone Patient not taking: Reported on 01/21/2022 12/05/21   Volney American, PA-C  ondansetron (ZOFRAN ODT) 4 MG disintegrating tablet Take 1 tablet (4 mg total) by mouth every 8 (eight) hours as needed for nausea or vomiting. Patient not taking: Reported on 01/21/2022 12/19/21   Estill Dooms, NP  predniSONE (DELTASONE) 10 MG tablet Take 4 tablets (40 mg total) by mouth daily. Patient not taking: Reported on 01/21/2022 01/05/22   Fredia Sorrow, MD  SUMAtriptan (IMITREX) 50 MG tablet Take 50 mg by mouth every 2 (two) hours as needed for migraine. 05/17/21   [provider]  tiZANidine (ZANAFLEX) 4 MG tablet Take  1 tablet (4 mg total) by mouth every 6 (six) hours as needed for muscle spasms. Patient not taking: Reported on 01/21/2022 09/13/21   Lindell Spar, MD  tiZANidine (ZANAFLEX) 4 MG tablet Take 1 tablet (4 mg total) by mouth every 6 (six) hours as needed for muscle spasms. 01/16/22   Ashok Pall, MD  Vitamin D, Ergocalciferol, (DRISDOL) 1.25 MG (50000 UNIT) CAPS capsule Take 1 capsule (50,000 Units total) by mouth every 7 (seven) days. Patient not taking: Reported on 01/21/2022 12/19/20   Lindell Spar, MD    Family History Family History  Problem Relation Age of Onset   Cancer Mother    Breast cancer Mother  Hypertension Father    Stroke Father    CAD Maternal Grandfather    CAD Paternal Grandmother    CAD Paternal Grandfather    Diabetes Other        great-great grandmother   CAD Other     Social History Social History   Tobacco Use   Smoking status: Every Day    Packs/day: 0.50    Years: 9.00    Pack years: 4.50    Types: Cigarettes    Passive exposure: Never   Smokeless tobacco: Never  Vaping Use   Vaping Use: Never used  Substance Use Topics   Alcohol use: Yes    Comment: Occa   Drug use: Never     Allergies   Latex   Review of Systems Review of Systems Per HPI  Physical Exam Triage Vital Signs ED Triage Vitals  Enc Vitals Group     BP 01/25/22 0918 107/74     Pulse Rate 01/25/22 0918 76     Resp 01/25/22 0918 18     Temp 01/25/22 0918 99.9 F (37.7 C)     Temp Source 01/25/22 0918 Oral     SpO2 01/25/22 0918 98 %     Weight --      Height --      Head Circumference --      Peak Flow --      Pain Score 01/25/22 0915 10     Pain Loc --      Pain Edu? --      Excl. in [place]? --    No data found.  Updated Vital Signs BP 107/74 (BP Location: Right Arm)   Pulse 76   Temp 99.9 F (37.7 C) (Oral)   Resp 18   LMP 01/03/2022 (Exact Date)   SpO2 98%   Visual Acuity Right Eye Distance:   Left Eye Distance:   Bilateral Distance:    Right  Eye Near:   Left Eye Near:    Bilateral Near:     Physical Exam Vitals and nursing note reviewed.  Constitutional:      Appearance: Normal appearance. She is not ill-appearing.  HENT:     Head: Atraumatic.     Right Ear: Tympanic membrane normal.     Left Ear: Tympanic membrane normal.     Mouth/Throat:     Mouth: Mucous membranes are moist.  Eyes:     Extraocular Movements: Extraocular movements intact.     Conjunctiva/sclera: Conjunctivae normal.     Pupils: Pupils are equal, round, and reactive to light.  Cardiovascular:     Rate and Rhythm: Normal rate and regular rhythm.     Heart sounds: Normal heart sounds.  Pulmonary:     Effort: Pulmonary effort is normal.     Breath sounds: Normal breath sounds.  Musculoskeletal:        General: No swelling, tenderness or deformity. Normal range of motion.     Cervical back: Normal range of motion and neck supple.  Skin:    General: Skin is warm and dry.     Comments: Incision in the L5 region appears to be healing without complication, no drainage, significant edema, erythema  Neurological:     General: No focal deficit present.     Mental Status: She is alert and oriented to person, place, and time.     Cranial Nerves: No cranial nerve deficit.     Motor: No weakness.     Gait: Gait normal.  Psychiatric:  Mood and Affect: Mood normal.        Thought Content: Thought content normal.        Judgment: Judgment normal.     UC Treatments / Results  Labs (all labs ordered are listed, but only abnormal results are displayed) Labs Reviewed - No data to display  EKG   Radiology No results found.  Procedures Procedures (including critical care time)  Medications Ordered in UC Medications  dexamethasone (DECADRON) injection 10 mg (10 mg Intramuscular Given 01/25/22 1014)  SUMAtriptan (IMITREX) injection 6 mg (6 mg Subcutaneous Given 01/25/22 1014)    Initial Impression / Assessment and Plan / UC Course  I have  reviewed the triage vital signs and the nursing notes.  Pertinent labs & imaging results that were available during my care of the patient were reviewed by me and considered in my medical decision making (see chart for details).     Unclear if migraine versus tension headache, already on tizanidine so continue this, will give IM Imitrex and Decadron to help with symptoms and continue pain regimen per postop instructions.  Supportive care return precautions reviewed.  Final Clinical Impressions(s) / UC Diagnoses   Final diagnoses:  Acute nonintractable headache, unspecified headache type  Nausea without vomiting   Discharge Instructions   None    ED Prescriptions   None    PDMP not reviewed this encounter.   Volney American, [place] 01/25/22 1038

## 2022-01-29 ENCOUNTER — Emergency Department (HOSPITAL_COMMUNITY): Payer: Medicaid Other

## 2022-01-29 ENCOUNTER — Inpatient Hospital Stay (HOSPITAL_COMMUNITY)
Admission: EM | Admit: 2022-01-29 | Discharge: 2022-02-01 | DRG: 030 | Disposition: A | Discharge status: Home / Self Care | Payer: Medicaid Other | Attending: Neurological Surgery | Admitting: Neurological Surgery

## 2022-01-29 ENCOUNTER — Ambulatory Visit: Payer: Medicaid Other | Admitting: Internal Medicine

## 2022-01-29 ENCOUNTER — Other Ambulatory Visit: Payer: Self-pay

## 2022-01-29 ENCOUNTER — Encounter (HOSPITAL_COMMUNITY): Payer: Self-pay | Admitting: Emergency Medicine

## 2022-01-29 ENCOUNTER — Encounter: Payer: Self-pay | Admitting: Family Medicine

## 2022-01-29 ENCOUNTER — Ambulatory Visit: Payer: Medicaid Other | Admitting: Family Medicine

## 2022-01-29 DIAGNOSIS — I10 Essential (primary) hypertension: Secondary | ICD-10-CM | POA: Diagnosis not present

## 2022-01-29 DIAGNOSIS — G96198 Other disorders of meninges, not elsewhere classified: Secondary | ICD-10-CM | POA: Diagnosis present

## 2022-01-29 DIAGNOSIS — F418 Other specified anxiety disorders: Secondary | ICD-10-CM | POA: Diagnosis not present

## 2022-01-29 DIAGNOSIS — Z8616 Personal history of COVID-19: Secondary | ICD-10-CM | POA: Diagnosis not present

## 2022-01-29 DIAGNOSIS — J45909 Unspecified asthma, uncomplicated: Secondary | ICD-10-CM | POA: Diagnosis present

## 2022-01-29 DIAGNOSIS — Z79899 Other long term (current) drug therapy: Secondary | ICD-10-CM | POA: Diagnosis not present

## 2022-01-29 DIAGNOSIS — R531 Weakness: Secondary | ICD-10-CM | POA: Diagnosis not present

## 2022-01-29 DIAGNOSIS — E669 Obesity, unspecified: Secondary | ICD-10-CM | POA: Diagnosis not present

## 2022-01-29 DIAGNOSIS — Y838 Other surgical procedures as the cause of abnormal reaction of the patient, or of later complication, without mention of misadventure at the time of the procedure: Secondary | ICD-10-CM | POA: Diagnosis present

## 2022-01-29 DIAGNOSIS — Z0389 Encounter for observation for other suspected diseases and conditions ruled out: Secondary | ICD-10-CM | POA: Diagnosis not present

## 2022-01-29 DIAGNOSIS — G9782 Other postprocedural complications and disorders of nervous system: Secondary | ICD-10-CM | POA: Diagnosis not present

## 2022-01-29 DIAGNOSIS — G9600 Cerebrospinal fluid leak, unspecified: Secondary | ICD-10-CM | POA: Diagnosis present

## 2022-01-29 DIAGNOSIS — F32A Depression, unspecified: Secondary | ICD-10-CM | POA: Diagnosis present

## 2022-01-29 DIAGNOSIS — F419 Anxiety disorder, unspecified: Secondary | ICD-10-CM | POA: Diagnosis present

## 2022-01-29 DIAGNOSIS — G9609 Other spinal cerebrospinal fluid leak: Secondary | ICD-10-CM | POA: Diagnosis not present

## 2022-01-29 DIAGNOSIS — Z6831 Body mass index (BMI) 31.0-31.9, adult: Secondary | ICD-10-CM | POA: Diagnosis not present

## 2022-01-29 DIAGNOSIS — G4452 New daily persistent headache (NDPH): Secondary | ICD-10-CM | POA: Diagnosis not present

## 2022-01-29 DIAGNOSIS — F17210 Nicotine dependence, cigarettes, uncomplicated: Secondary | ICD-10-CM | POA: Diagnosis present

## 2022-01-29 DIAGNOSIS — K219 Gastro-esophageal reflux disease without esophagitis: Secondary | ICD-10-CM | POA: Diagnosis present

## 2022-01-29 DIAGNOSIS — M545 Low back pain, unspecified: Secondary | ICD-10-CM | POA: Diagnosis not present

## 2022-01-29 DIAGNOSIS — Z91040 Latex allergy status: Secondary | ICD-10-CM

## 2022-01-29 HISTORY — DX: Cerebrospinal fluid leak, unspecified: G96.00

## 2022-01-29 LAB — COMPREHENSIVE METABOLIC PANEL
ALT: 12 U/L (ref 0–44)
AST: 13 U/L — ABNORMAL LOW (ref 15–41)
Albumin: 3.9 g/dL (ref 3.5–5.0)
Alkaline Phosphatase: 55 U/L (ref 38–126)
Anion gap: 6 (ref 5–15)
BUN: 11 mg/dL (ref 6–20)
CO2: 23 mmol/L (ref 22–32)
Calcium: 9.3 mg/dL (ref 8.9–10.3)
Chloride: 108 mmol/L (ref 98–111)
Creatinine, Ser: 0.73 mg/dL (ref 0.44–1.00)
GFR, Estimated: >60 mL/min (ref >60)
Glucose, Bld: 94 mg/dL (ref 70–99)
Potassium: 4 mmol/L (ref 3.5–5.1)
Sodium: 137 mmol/L (ref 135–145)
Total Bilirubin: 0.6 mg/dL (ref 0.3–1.2)
Total Protein: 7.7 g/dL (ref 6.5–8.1)

## 2022-01-29 LAB — CBC WITH DIFFERENTIAL/PLATELET
Abs Immature Granulocytes: 0.02 10*3/uL (ref 0.00–0.07)
Basophils Absolute: 0.1 10*3/uL (ref 0.0–0.1)
Basophils Relative: 1 %
Eosinophils Absolute: 1 10*3/uL — ABNORMAL HIGH (ref 0.0–0.5)
Eosinophils Relative: 12 %
HCT: 44.2 % (ref 36.0–46.0)
Hemoglobin: 14.4 g/dL (ref 12.0–15.0)
Immature Granulocytes: 0 %
Lymphocytes Relative: 37 %
Lymphs Abs: 3 10*3/uL (ref 0.7–4.0)
MCH: 28.2 pg (ref 26.0–34.0)
MCHC: 32.6 g/dL (ref 30.0–36.0)
MCV: 86.7 fL (ref 80.0–100.0)
Monocytes Absolute: 0.5 10*3/uL (ref 0.1–1.0)
Monocytes Relative: 6 %
Neutro Abs: 3.6 10*3/uL (ref 1.7–7.7)
Neutrophils Relative %: 44 %
Platelets: 227 10*3/uL (ref 150–400)
RBC: 5.1 MIL/uL (ref 3.87–5.11)
RDW: 13.2 % (ref 11.5–15.5)
WBC: 8.1 10*3/uL (ref 4.0–10.5)
nRBC: 0 % (ref 0.0–0.2)

## 2022-01-29 LAB — PREGNANCY, URINE: Preg Test, Ur: NEGATIVE

## 2022-01-29 MED ORDER — ONDANSETRON HCL 4 MG/2ML IJ SOLN
4.0000 mg | Freq: Four times a day (QID) | INTRAMUSCULAR | Status: DC | PRN
  Administered 2022-01-29: 4 mg via INTRAVENOUS
  Filled 2022-01-29: qty 2

## 2022-01-29 MED ORDER — DIPHENHYDRAMINE HCL 50 MG/ML IJ SOLN
25.0000 mg | Freq: Once | INTRAMUSCULAR | Status: AC
  Administered 2022-01-29: 25 mg via INTRAVENOUS
  Filled 2022-01-29: qty 1

## 2022-01-29 MED ORDER — HYDROMORPHONE HCL 1 MG/ML IJ SOLN
INTRAMUSCULAR | Status: AC
  Administered 2022-01-29: 1 mg via INTRAVENOUS
  Filled 2022-01-29: qty 1

## 2022-01-29 MED ORDER — DEXAMETHASONE SODIUM PHOSPHATE 4 MG/ML IJ SOLN
4.0000 mg | Freq: Four times a day (QID) | INTRAMUSCULAR | Status: DC
  Administered 2022-01-29 – 2022-01-30 (×3): 4 mg via INTRAVENOUS
  Filled 2022-01-29 (×3): qty 1

## 2022-01-29 MED ORDER — ONDANSETRON HCL 4 MG/2ML IJ SOLN: 4.0000 mg | Freq: Once | INTRAMUSCULAR | Status: DC

## 2022-01-29 MED ORDER — OXYCODONE HCL 5 MG PO TABS
10.0000 mg | ORAL_TABLET | ORAL | Status: DC | PRN
  Administered 2022-01-29 – 2022-01-31 (×5): 10 mg via ORAL
  Filled 2022-01-29 (×5): qty 2

## 2022-01-29 MED ORDER — HYDROMORPHONE HCL 1 MG/ML IJ SOLN
1.0000 mg | Freq: Once | INTRAMUSCULAR | Status: AC
  Administered 2022-01-29: 1 mg via INTRAVENOUS
  Filled 2022-01-29: qty 1

## 2022-01-29 MED ORDER — ONDANSETRON HCL 4 MG PO TABS: 4.0000 mg | ORAL_TABLET | Freq: Four times a day (QID) | ORAL | Status: DC | PRN

## 2022-01-29 MED ORDER — ONDANSETRON HCL 4 MG/2ML IJ SOLN
4.0000 mg | Freq: Once | INTRAMUSCULAR | Status: AC
  Administered 2022-01-29: 4 mg via INTRAVENOUS
  Filled 2022-01-29: qty 2

## 2022-01-29 MED ORDER — HYDROMORPHONE HCL 1 MG/ML IJ SOLN: 1.0000 mg | Freq: Once | INTRAMUSCULAR | Status: AC

## 2022-01-29 MED ORDER — DEXTROSE-NACL 5-0.45 % IV SOLN: INTRAVENOUS | Status: AC

## 2022-01-29 MED ORDER — HYDROMORPHONE HCL 1 MG/ML IJ SOLN: 0.5000 mg | INTRAMUSCULAR | Status: DC | PRN

## 2022-01-29 MED ORDER — CELECOXIB 200 MG PO CAPS
200.0000 mg | ORAL_CAPSULE | Freq: Two times a day (BID) | ORAL | Status: DC
  Administered 2022-01-29 – 2022-02-01 (×6): 200 mg via ORAL
  Filled 2022-01-29 (×6): qty 1

## 2022-01-29 MED ORDER — ACETAMINOPHEN 500 MG PO TABS
1000.0000 mg | ORAL_TABLET | Freq: Four times a day (QID) | ORAL | Status: AC
  Administered 2022-01-29 – 2022-01-30 (×2): 1000 mg via ORAL
  Filled 2022-01-29 (×2): qty 2

## 2022-01-29 MED ORDER — PROCHLORPERAZINE EDISYLATE 10 MG/2ML IJ SOLN
10.0000 mg | Freq: Once | INTRAMUSCULAR | Status: AC
  Administered 2022-01-29: 10 mg via INTRAVENOUS
  Filled 2022-01-29: qty 2

## 2022-01-29 MED ORDER — LACTATED RINGERS IV BOLUS
1000.0000 mL | Freq: Once | INTRAVENOUS | Status: AC
  Administered 2022-01-29: 1000 mL via INTRAVENOUS

## 2022-01-29 MED ORDER — KETOROLAC TROMETHAMINE 15 MG/ML IJ SOLN
15.0000 mg | Freq: Once | INTRAMUSCULAR | Status: AC
  Administered 2022-01-29: 15 mg via INTRAVENOUS
  Filled 2022-01-29: qty 1

## 2022-01-29 MED ORDER — DEXAMETHASONE 4 MG PO TABS
4.0000 mg | ORAL_TABLET | Freq: Four times a day (QID) | ORAL | Status: DC
  Administered 2022-01-30 – 2022-02-01 (×6): 4 mg via ORAL
  Filled 2022-01-29 (×6): qty 1

## 2022-01-29 MED ORDER — POTASSIUM CHLORIDE IN NACL 20-0.9 MEQ/L-% IV SOLN
INTRAVENOUS | Status: DC
  Filled 2022-01-29: qty 1000

## 2022-01-29 MED ORDER — SODIUM CHLORIDE 0.9 % IV SOLN: 500.0000 mg | Freq: Once | INTRAVENOUS | Status: DC

## 2022-01-29 MED ORDER — SENNA 8.6 MG PO TABS
1.0000 | ORAL_TABLET | Freq: Two times a day (BID) | ORAL | Status: DC
  Administered 2022-01-29 – 2022-02-01 (×5): 8.6 mg via ORAL
  Filled 2022-01-29 (×5): qty 1

## 2022-01-29 NOTE — ED Notes (Signed)
Pt also reports dizziness upon standing since having surgery; surgery 2 weeks ago- lumbar surgery

## 2022-01-29 NOTE — ED Triage Notes (Signed)
Pt to the ED with back pain and a headache after back surgery on 01/15/22.  Pt states the headaches have become more intense and the surgical site in the lumbar area just doesn't feel right.

## 2022-01-29 NOTE — ED Provider Notes (Signed)
[facility] EMERGENCY DEPARTMENT Provider Note   CSN: [id number] Arrival date & time: 01/29/22  1029     History  Chief Complaint  Patient presents with   Back Pain    Kaylee Miller is a 31 y.o. female.  HPI 31 year old female presents with headache and back pain.  She had microdiscectomy of L5-S1 on 5/24. Was doing well at first. About 5 days after surgery she developed a bilateral squeezing/tight headache. Took excedrin which initially helped. Headache has worsened. It is constant, but best when lying flat, worse with sitting/standing, no matter how slowly she stands.  During that time she is also had low back pain.  States her back feels "funky".  She has developed some recurrent right posterior leg pain that she had prior to the surgery.  No new weakness in her extremities.  She is having some photophobia.  She has also been trying her oral pain medicine and muscle relaxer.  Home Medications Prior to Admission medications   Medication Sig Start Date End Date Taking? Authorizing Provider  albuterol (VENTOLIN HFA) 108 (90 Base) MCG/ACT inhaler Inhale 2 puffs into the lungs every 6 (six) hours as needed for wheezing or shortness of breath. 06/10/20   Rolland Porter, MD  aspirin-acetaminophen-caffeine (EXCEDRIN MIGRAINE) [phone] MG tablet Take 2 tablets by mouth every 6 (six) hours as needed for migraine.    [provider]  docusate sodium (COLACE) 100 MG capsule Take 200 mg by mouth daily.    [provider]  escitalopram (LEXAPRO) 10 MG tablet Take 1 tablet (10 mg total) by mouth daily. 01/21/22 01/21/23  Estill Dooms, NP  fluticasone (FLONASE) 50 MCG/ACT nasal spray Place 2 sprays into both nostrils daily as needed for allergies or rhinitis.    [provider]  HYDROcodone-acetaminophen (NORCO/VICODIN) 5-325 MG tablet Take 1 tablet by mouth every 6 (six) hours as needed for severe pain. 01/07/22   Lindell Spar, MD  ibuprofen (ADVIL) 200 MG tablet  Take 800 mg by mouth every 6 (six) hours as needed for cramping.    [provider]  meclizine (ANTIVERT) 25 MG tablet Take 1 tablet (25 mg total) by mouth 3 (three) times daily as needed for dizziness. 01/11/21   Raspet, Derry Skill, PA-C  medroxyPROGESTERone Acetate 150 MG/ML SUSY Inject 1 mL (150 mg total) into the muscle every 3 (three) months. INJECT 1 ML INTO THE MUSCLE EVERY 3 MONTHS Strength: 150 mg/mL 08/21/21   Derrek Monaco A, NP  Melatonin 5 MG CAPS Take 5 mg by mouth at bedtime as needed (sleep).    [provider]  ondansetron (ZOFRAN ODT) 4 MG disintegrating tablet Take 1 tablet (4 mg total) by mouth every 8 (eight) hours as needed for nausea or vomiting. 12/19/21   Estill Dooms, NP  SUMAtriptan (IMITREX) 50 MG tablet TAKE 1 TABLET BY MOUTH EVERY 2 HOURS AS NEEDED FOR MIGRAINE. MAY REPEAT IN 2 HOURS IF HEADACHE PERSISTS OR RECURS. MAXIMUM OF 2 DOSES. 01/27/22   Lindell Spar, MD  tiZANidine (ZANAFLEX) 4 MG tablet Take 1 tablet (4 mg total) by mouth every 6 (six) hours as needed for muscle spasms. 01/16/22   Ashok Pall, MD  Vitamin D, Ergocalciferol, (DRISDOL) 1.25 MG (50000 UNIT) CAPS capsule Take 1 capsule (50,000 Units total) by mouth every 7 (seven) days. 12/19/20   Lindell Spar, MD      Allergies    Latex    Review of Systems   Review  of Systems  Constitutional:  Negative for fever.  Eyes:  Positive for photophobia.  Musculoskeletal:  Positive for back pain. Negative for neck stiffness.  Neurological:  Positive for headaches. Negative for weakness and numbness.   Physical Exam Updated Vital Signs BP (!) 95/51   Pulse 69   Temp 98.1 F (36.7 C) (Oral)   Resp 17   Ht '5\' 7"'$  (1.702 m)   Wt 90.3 kg   LMP 01/03/2022 (Exact Date)   SpO2 98%   BMI 31.18 kg/m  Physical Exam Vitals and nursing note reviewed.  Constitutional:      Appearance: She is well-developed.  HENT:     Head: Normocephalic and atraumatic.     Comments: No scalp  tenderness Eyes:     Extraocular Movements: Extraocular movements intact.     Pupils: Pupils are equal, round, and reactive to light.     Comments: photophobia  Cardiovascular:     Rate and Rhythm: Normal rate and regular rhythm.     Heart sounds: Normal heart sounds.  Pulmonary:     Effort: Pulmonary effort is normal.     Breath sounds: Normal breath sounds.  Abdominal:     Palpations: Abdomen is soft.     Tenderness: There is no abdominal tenderness.  Musculoskeletal:     Cervical back: Normal range of motion. No rigidity.     Lumbar back: Tenderness present.       Back:  Skin:    General: Skin is warm and dry.  Neurological:     Mental Status: She is alert.     Deep Tendon Reflexes:     Reflex Scores:      Patellar reflexes are 2+ on the right side and 2+ on the left side.      Achilles reflexes are 2+ on the right side and 2+ on the left side.    Comments: CN 3-12 grossly intact. 5/5 strength in all 4 extremities. This includes plantar/dorsiflexion of both feet. Grossly normal sensation. Normal finger to nose.     ED Results / Procedures / Treatments   Labs (all labs ordered are listed, but only abnormal results are displayed) Labs Reviewed  COMPREHENSIVE METABOLIC PANEL - Abnormal; Notable for the following components:      Result Value   AST 13 (*)    All other components within normal limits  CBC WITH DIFFERENTIAL/PLATELET - Abnormal; Notable for the following components:   Eosinophils Absolute 1.0 (*)    All other components within normal limits  PREGNANCY, URINE    EKG None  Radiology MR BRAIN WO CONTRAST  Result Date: 01/29/2022 CLINICAL DATA:  None EXAM: MRI HEAD WITHOUT CONTRAST TECHNIQUE: Multiplanar, multiecho pulse sequences of the brain and surrounding structures were obtained without intravenous contrast. COMPARISON:  None FINDINGS: Brain: Brain appears normally formed. There is no evidence of old or acute infarction, mass lesion, hemorrhage,  hydrocephalus or extra-axial collection. No imaging evidence of brainstem sagging. Vascular: Major vessels at the base of the brain show flow. Skull and upper cervical spine: Negative Sinuses/Orbits: Clear/normal Other: None IMPRESSION: Normal appearance of the brain itself. No imaging finding of intracranial hypotension. No brainstem sagging. Electronically Signed   By: Nelson Chimes M.D.   On: 01/29/2022 14:00   MR LUMBAR SPINE WO CONTRAST  Result Date: 01/29/2022 CLINICAL DATA:  Lumbar surgery 2 weeks ago, back pain, CSF leak EXAM: MRI LUMBAR SPINE WITHOUT CONTRAST TECHNIQUE: Multiplanar, multisequence MR imaging of the lumbar spine was performed. No intravenous  contrast was administered. COMPARISON:  MR lumbar spine FINDINGS: Segmentation: The lowest well-formed disc space is designated as L5-S1, consistent with prior MRIs. Alignment:  Physiologic. Vertebrae: Postsurgical changes of recent right laminectomy at L5-S1. No fracture, evidence of discitis, or aggressive bone lesion. Conus medullaris and cauda equina: Conus extends to the L1 level. Conus and cauda equina appear normal. Paraspinal and other soft tissues: There is a subcutaneous fluid collection measures 8.1 x 2.7 x 6.1 cm which extends to connect with the laminectomy site at L5-S1 (sagittal stir image 11, axial T2 image 26). Disc levels: T11-T12: No significant spinal canal or neural foraminal narrowing. T12-L1: No significant spinal canal or neural foraminal narrowing. L1-L2: No significant spinal canal or neural foraminal narrowing. L2-L3: No significant spinal canal or neural foraminal narrowing. L3-L4: No significant spinal canal or neural foraminal narrowing. L4-L5: Minimal disc bulging.  No significant stenosis. L5-S1: Prior right hemilaminectomy. There is ligamentum flavum thinning with fluid collection at the laminectomy site measuring 1.8 x 1.8 x 1.5 cm, which communicates with the subcutaneous collection previously mentioned. The fluid  collection results in no significant mass effect. There is disc height loss with residual disc bulging at L5-S1 but improved patency of the right subarticular zone. IMPRESSION: Paraspinal fluid collection at the right hemilaminectomy site measuring 1.8 x 1.8 x 1.5 cm, communicating with a subcutaneous collection measuring 8.1 x 2.7 x 6.1 cm. This could represent a postoperative seroma or potentially sequela of CSF leak. Residual disc bulging but overall improved patency of the right subarticular zone at L5-S1. Electronically Signed   By: Maurine Simmering M.D.   On: 01/29/2022 14:39    Procedures Procedures    Medications Ordered in ED Medications  HYDROmorphone (DILAUDID) injection 1 mg (has no administration in time range)  dextrose 5 %-0.45 % sodium chloride infusion (has no administration in time range)  caffeine-sodium benzoate ADULT 500 mg in sodium chloride 0.9 % 1,000 mL IVPB (has no administration in time range)  lactated ringers bolus 1,000 mL (1,000 mLs Intravenous New Bag/Given 01/29/22 1152)  prochlorperazine (COMPAZINE) injection 10 mg (10 mg Intravenous Given 01/29/22 1153)  diphenhydrAMINE (BENADRYL) injection 25 mg (25 mg Intravenous Given 01/29/22 1153)  ketorolac (TORADOL) 15 MG/ML injection 15 mg (15 mg Intravenous Given 01/29/22 1153)    ED Course/ Medical Decision Making/ A&P                           Medical Decision Making Amount and/or Complexity of Data Reviewed Independent Historian: parent External Data Reviewed: notes. Labs: ordered. Radiology: ordered and independent interpretation performed.  Risk Prescription drug management. Decision regarding hospitalization.   Patient presents with what seems like a CSF leak after her lumbar surgery.  MRI brain and L-spine obtained.  The brain shows no acute emergent abnormalities.  I personally viewed these images.  L-spine images also viewed/interpreted by myself and there is a sizable fluid collection, in this clinical setting  am concerned this is a CSF leak.  Given how miserable she is it could be active.  She is not febrile, altered, and there is low concern this is an infection.  Labs have been reviewed/interpreted and are benign including normal WBC and electrolytes.  She was given multiple medicines for pain for both back and head including Compazine, Benadryl, ketorolac and fluids.  We will also give IV Dilaudid.  Unfortunately we do not have IV caffeine appear.  I discussed her case and work-up with Dr.  Jones of neurosurgery.  He will admit to his service.  Keep n.p.o. for now.  Will transfer to [facility].        Final Clinical Impression(s) / ED Diagnoses Final diagnoses:  CSF leak    Rx / DC Orders ED Discharge Orders     None         Sherwood Gambler, MD 01/29/22 1512

## 2022-01-29 NOTE — Patient Instructions (Addendum)
Please go to the ED for further evaluation an management of new onset severe, debilitating headache x 1 week, 2 weeks post spine surgery  Keep ppointment as before with Dr Posey Pronto  I will speak directly with the Physician in the ED  Thanks for choosing [facility], we consider it a privelige to serve you.

## 2022-01-29 NOTE — H&P (Signed)
Subjective: Patient is a 31 y.o. female who complains of 1 week history of progressive headache with back pain and leg pain now 2 weeks status post redo microdiscectomy L5-S1 on the right by Dr. Christella Noa. Onset of symptoms was 1 week ago, gradually worsening since that time.  Onset was not related to no known injury. The pain is rated severe, and is located in the head and is positional but she also has some back pain with leg pain. The pain is described as aching and occurs all day. The symptoms has been progressive. Symptoms are exacerbated by standing. The patient has tried pain medication. 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  Past Medical History:  Diagnosis Date   Anemia    Anxiety    Asthma    [employer] 07/02/2020   Contraceptive management 02/08/2015   COVID    mild case   Depression    GERD (gastroesophageal reflux disease)    Headache(784.0)    migraines   History of bronchitis    HSV-2 (herpes simplex virus 2) infection    Pneumonia    Pregnant    S/P C-section 07/28/2013   Sciatic nerve pain    Seasonal allergies    Vaginal Pap smear, abnormal     Past Surgical History:  Procedure Laterality Date   adenoids     BACK SURGERY     CESAREAN SECTION  08/25/2005   CESAREAN SECTION N/A 07/28/2013   Procedure: CESAREAN SECTION;  Surgeon: Jonnie Kind, MD;  Location: [facility];  Service: Obstetrics;  Laterality: N/A;   LUMBAR LAMINECTOMY/DECOMPRESSION MICRODISCECTOMY Right 05/25/2021   Procedure: DISCECTOMY Lumbar five - Sacral one;  Surgeon: Ashok Pall, MD;  Location: [facility];  Service: Neurosurgery;  Laterality: Right;   LUMBAR LAMINECTOMY/DECOMPRESSION MICRODISCECTOMY Right 01/15/2022   Procedure: Right Lumbar Five-Sacral One Microdiscectomy;  Surgeon: Ashok Pall, MD;  Location: [facility];  Service: Neurosurgery;  Laterality: Right;  3C/RM 19   tubes in ears      Allergies  Allergen Reactions   Latex Swelling    Social History   Tobacco Use    Smoking status: Every Day    Packs/day: 0.50    Years: 9.00    Pack years: 4.50    Types: Cigarettes    Passive exposure: Never   Smokeless tobacco: Never  Substance Use Topics   Alcohol use: Yes    Comment: Occa    Family History  Problem Relation Age of Onset   Cancer Mother    Breast cancer Mother    Hypertension Father    Stroke Father    CAD Maternal Grandfather    CAD Paternal Grandmother    CAD Paternal Grandfather    Diabetes Other        great-great grandmother   CAD Other    Prior to Admission medications   Medication Sig Start Date End Date Taking? Authorizing Provider  albuterol (VENTOLIN HFA) 108 (90 Base) MCG/ACT inhaler Inhale 2 puffs into the lungs every 6 (six) hours as needed for wheezing or shortness of breath. 06/10/20  Yes Rolland Porter, MD  aspirin-acetaminophen-caffeine (EXCEDRIN MIGRAINE) [phone] MG tablet Take 2 tablets by mouth every 6 (six) hours as needed for migraine.   Yes [provider]  docusate sodium (COLACE) 100 MG capsule Take 200 mg by mouth daily.   Yes [provider]  fluticasone (FLONASE) 50 MCG/ACT nasal spray Place 2 sprays into both nostrils daily as needed for allergies or rhinitis.   Yes [provider]  ibuprofen (ADVIL) 200 MG tablet Take 800 mg by mouth every 6 (six) hours as needed for cramping.   Yes [provider]  meclizine (ANTIVERT) 25 MG tablet Take 1 tablet (25 mg total) by mouth 3 (three) times daily as needed for dizziness. 01/11/21  Yes Raspet, Derry Skill, PA-C  medroxyPROGESTERone Acetate 150 MG/ML SUSY Inject 1 mL (150 mg total) into the muscle every 3 (three) months. INJECT 1 ML INTO THE MUSCLE EVERY 3 MONTHS Strength: 150 mg/mL 08/21/21  Yes Derrek Monaco A, NP  Melatonin 5 MG CAPS Take 5 mg by mouth at bedtime as needed (sleep).   Yes [provider]  ondansetron (ZOFRAN ODT) 4 MG disintegrating tablet Take 1 tablet (4 mg total) by mouth every 8 (eight) hours as needed for  nausea or vomiting. 12/19/21  Yes Derrek Monaco A, NP  oxyCODONE (OXY IR/ROXICODONE) 5 MG immediate release tablet Take 5 mg by mouth 4 (four) times daily as needed. 01/16/22  Yes [provider]  SUMAtriptan (IMITREX) 50 MG tablet TAKE 1 TABLET BY MOUTH EVERY 2 HOURS AS NEEDED FOR MIGRAINE. MAY REPEAT IN 2 HOURS IF HEADACHE PERSISTS OR RECURS. MAXIMUM OF 2 DOSES. Patient taking differently: Take 50 mg by mouth every 2 (two) hours as needed for migraine. 01/27/22  Yes Lindell Spar, MD  tiZANidine (ZANAFLEX) 4 MG tablet Take 1 tablet (4 mg total) by mouth every 6 (six) hours as needed for muscle spasms. 01/16/22  Yes Ashok Pall, MD  Vitamin D, Ergocalciferol, (DRISDOL) 1.25 MG (50000 UNIT) CAPS capsule Take 1 capsule (50,000 Units total) by mouth every 7 (seven) days. 12/19/20  Yes Lindell Spar, MD  escitalopram (LEXAPRO) 10 MG tablet Take 1 tablet (10 mg total) by mouth daily. 01/21/22 01/21/23  Estill Dooms, NP  HYDROcodone-acetaminophen (NORCO/VICODIN) 5-325 MG tablet Take 1 tablet by mouth every 6 (six) hours as needed for severe pain. Patient not taking: Reported on 01/29/2022 01/07/22   Lindell Spar, MD     Review of Systems  Positive ROS: Negative  All other systems have been reviewed and were otherwise negative with the exception of those mentioned in the HPI and as above.  Objective: Vital signs in last 24 hours: Temp:  [98 F (36.7 C)-98.6 F (37 C)] 98.6 F (37 C) (06/07 2248) Pulse Rate:  [65-92] 73 (06/07 2248) Resp:  [13-20] 20 (06/07 2248) BP: (95-127)/(51-84) 110/84 (06/07 2248) SpO2:  [98 %-100 %] 100 % (06/07 2105) Weight:  [90.3 kg-90.6 kg] 90.3 kg (06/07 1053)  General Appearance: Alert, cooperative, no distress, appears stated age Head: Normocephalic, without obvious abnormality, atraumatic Eyes: PERRL, conjunctiva/corneas clear, EOM's intact   Throat: Lips, mucosa, and tongue normal; teeth and gums normal Neck: Supple, symmetrical, trachea  midline Back: Incision clean dry and intact without drainage or redness Lungs:respirations unlabored Heart: Regular rate and rhythm Abdomen: Soft Extremities: Extremities normal, atraumatic, no cyanosis or edema Pulses: 2+ and symmetric all extremities Skin: Skin color, texture, turgor normal, no rashes or lesions  NEUROLOGIC:   Mental status: alert and oriented, no aphasia, good attention span, Fund of knowledge/ memory ok Motor Exam - grossly normal Sensory Exam - grossly normal Reflexes:  Coordination - grossly normal Gait -not assessed Balance -not assessed Cranial Nerves: I: smell Not tested  II: visual acuity  OS: na    OD: na  II: visual fields Full to confrontation  II: pupils Equal, round, reactive to light  III,VII: ptosis None  III,IV,VI:  extraocular muscles  Full ROM  V: mastication Normal  V: facial light touch sensation  Normal  V,VII: corneal reflex  Present  VII: facial muscle function - upper  Normal  VII: facial muscle function - lower Normal  VIII: hearing Not tested  IX: soft palate elevation  Normal  IX,X: gag reflex Present  XI: trapezius strength  5/5  XI: sternocleidomastoid strength 5/5  XI: neck flexion strength  5/5  XII: tongue strength  Normal    Data Review Lab Results  Component Value Date   WBC 8.1 01/29/2022   HGB 14.4 01/29/2022   HCT 44.2 01/29/2022   MCV 86.7 01/29/2022   PLT 227 01/29/2022   Lab Results  Component Value Date   NA 137 01/29/2022   K 4.0 01/29/2022   CL 108 01/29/2022   CO2 23 01/29/2022   BUN 11 01/29/2022   CREATININE 0.73 01/29/2022   GLUCOSE 94 01/29/2022   No results found for: INR, PROTIME  Assessment/Plan: 31 year old female who is 2 weeks status post redo microdiscectomy L4-S1 on the right by Dr. Christella Noa.  There was an intraoperative CSF leak with primary repair.  Headache started a week ago.  Appears positional.  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  There is no leakage from the  skin and therefore dural repair is not emergent.  We will admit her for pain control and we will either have one of the partners try to repair this tomorrow or maybe I can do it on Friday.  Dr. Christella Noa is out of town but knows of the patient's admission.   Kaylee Miller 01/29/2022 10:59 PM

## 2022-01-30 ENCOUNTER — Inpatient Hospital Stay (HOSPITAL_COMMUNITY): Payer: Medicaid Other | Admitting: Anesthesiology

## 2022-01-30 ENCOUNTER — Encounter (HOSPITAL_COMMUNITY): Payer: Self-pay | Admitting: Neurological Surgery

## 2022-01-30 ENCOUNTER — Encounter (HOSPITAL_COMMUNITY)
Admission: EM | Disposition: A | Discharge status: Home / Self Care | Payer: Self-pay | Source: Home / Self Care | Attending: Neurological Surgery

## 2022-01-30 DIAGNOSIS — F418 Other specified anxiety disorders: Secondary | ICD-10-CM | POA: Diagnosis not present

## 2022-01-30 DIAGNOSIS — I10 Essential (primary) hypertension: Secondary | ICD-10-CM

## 2022-01-30 DIAGNOSIS — G9600 Cerebrospinal fluid leak, unspecified: Secondary | ICD-10-CM | POA: Diagnosis not present

## 2022-01-30 DIAGNOSIS — J45909 Unspecified asthma, uncomplicated: Secondary | ICD-10-CM

## 2022-01-30 DIAGNOSIS — F17210 Nicotine dependence, cigarettes, uncomplicated: Secondary | ICD-10-CM

## 2022-01-30 HISTORY — PX: LUMBAR LAMINECTOMY/DECOMPRESSION MICRODISCECTOMY: SHX5026

## 2022-01-30 SURGERY — LUMBAR LAMINECTOMY/DECOMPRESSION MICRODISCECTOMY 1 LEVEL
Anesthesia: General

## 2022-01-30 MED ORDER — ROCURONIUM BROMIDE 10 MG/ML (PF) SYRINGE
PREFILLED_SYRINGE | INTRAVENOUS | Status: DC | PRN
  Administered 2022-01-30: 15 mg via INTRAVENOUS
  Administered 2022-01-30: 60 mg via INTRAVENOUS

## 2022-01-30 MED ORDER — SODIUM CHLORIDE 0.9% FLUSH: 3.0000 mL | INTRAVENOUS | Status: DC | PRN

## 2022-01-30 MED ORDER — LACTATED RINGERS IV SOLN: INTRAVENOUS | Status: DC

## 2022-01-30 MED ORDER — ONDANSETRON HCL 4 MG/2ML IJ SOLN
INTRAMUSCULAR | Status: DC | PRN
  Administered 2022-01-30: 4 mg via INTRAVENOUS

## 2022-01-30 MED ORDER — ACETAMINOPHEN 325 MG PO TABS: 650.0000 mg | ORAL_TABLET | ORAL | Status: DC | PRN

## 2022-01-30 MED ORDER — MENTHOL 3 MG MT LOZG: 1.0000 | LOZENGE | OROMUCOSAL | Status: DC | PRN

## 2022-01-30 MED ORDER — LIDOCAINE-EPINEPHRINE 1 %-1:100000 IJ SOLN
INTRAMUSCULAR | Status: AC
  Filled 2022-01-30: qty 1

## 2022-01-30 MED ORDER — LIDOCAINE 2% (20 MG/ML) 5 ML SYRINGE
INTRAMUSCULAR | Status: DC | PRN
  Administered 2022-01-30: 60 mg via INTRAVENOUS

## 2022-01-30 MED ORDER — PHENOL 1.4 % MT LIQD: 1.0000 | OROMUCOSAL | Status: DC | PRN

## 2022-01-30 MED ORDER — LIDOCAINE 2% (20 MG/ML) 5 ML SYRINGE
INTRAMUSCULAR | Status: AC
  Filled 2022-01-30: qty 5

## 2022-01-30 MED ORDER — ROCURONIUM BROMIDE 10 MG/ML (PF) SYRINGE
PREFILLED_SYRINGE | INTRAVENOUS | Status: AC
  Filled 2022-01-30: qty 10

## 2022-01-30 MED ORDER — HYDROCODONE-ACETAMINOPHEN 5-325 MG PO TABS
1.0000 | ORAL_TABLET | ORAL | Status: DC | PRN
  Administered 2022-01-30: 1 via ORAL
  Filled 2022-01-30: qty 1

## 2022-01-30 MED ORDER — LIDOCAINE-EPINEPHRINE 1 %-1:100000 IJ SOLN
INTRAMUSCULAR | Status: DC | PRN
  Administered 2022-01-30: 10 mL

## 2022-01-30 MED ORDER — MIDAZOLAM HCL 2 MG/2ML IJ SOLN
INTRAMUSCULAR | Status: AC
  Filled 2022-01-30: qty 2

## 2022-01-30 MED ORDER — HYDROCODONE-ACETAMINOPHEN 7.5-325 MG PO TABS
2.0000 | ORAL_TABLET | ORAL | Status: DC | PRN
  Administered 2022-01-31: 2 via ORAL
  Filled 2022-01-30: qty 2

## 2022-01-30 MED ORDER — OXYCODONE HCL 5 MG/5ML PO SOLN: 5.0000 mg | Freq: Once | ORAL | Status: DC | PRN

## 2022-01-30 MED ORDER — CHLORHEXIDINE GLUCONATE 0.12 % MT SOLN
15.0000 mL | Freq: Once | OROMUCOSAL | Status: AC
  Administered 2022-01-30: 15 mL via OROMUCOSAL
  Filled 2022-01-30: qty 15

## 2022-01-30 MED ORDER — MIDAZOLAM HCL 2 MG/2ML IJ SOLN
INTRAMUSCULAR | Status: DC | PRN
  Administered 2022-01-30: 2 mg via INTRAVENOUS

## 2022-01-30 MED ORDER — DEXAMETHASONE SODIUM PHOSPHATE 10 MG/ML IJ SOLN
INTRAMUSCULAR | Status: DC | PRN
  Administered 2022-01-30: 10 mg via INTRAVENOUS

## 2022-01-30 MED ORDER — DEXAMETHASONE SODIUM PHOSPHATE 10 MG/ML IJ SOLN
INTRAMUSCULAR | Status: AC
  Filled 2022-01-30: qty 1

## 2022-01-30 MED ORDER — HYDROMORPHONE HCL 1 MG/ML IJ SOLN
0.2500 mg | INTRAMUSCULAR | Status: DC | PRN
  Administered 2022-01-30: 0.5 mg via INTRAVENOUS

## 2022-01-30 MED ORDER — BUPIVACAINE-EPINEPHRINE 0.5% -1:200000 IJ SOLN
INTRAMUSCULAR | Status: AC
  Filled 2022-01-30: qty 1

## 2022-01-30 MED ORDER — FENTANYL CITRATE (PF) 250 MCG/5ML IJ SOLN
INTRAMUSCULAR | Status: DC | PRN
  Administered 2022-01-30: 50 ug via INTRAVENOUS
  Administered 2022-01-30: 100 ug via INTRAVENOUS
  Administered 2022-01-30 (×2): 50 ug via INTRAVENOUS

## 2022-01-30 MED ORDER — HEMOSTATIC AGENTS (NO CHARGE) OPTIME
TOPICAL | Status: DC | PRN
  Administered 2022-01-30: 1 via TOPICAL

## 2022-01-30 MED ORDER — SODIUM CHLORIDE 0.9 % IV SOLN
250.0000 mL | INTRAVENOUS | Status: DC
Start: 2022-01-30 — End: 2022-02-01

## 2022-01-30 MED ORDER — THROMBIN 5000 UNITS EX SOLR
OROMUCOSAL | Status: DC | PRN
  Administered 2022-01-30: 5 mL via TOPICAL

## 2022-01-30 MED ORDER — CEFAZOLIN SODIUM-DEXTROSE 2-4 GM/100ML-% IV SOLN
2.0000 g | INTRAVENOUS | Status: AC
  Administered 2022-01-30: 2 g via INTRAVENOUS
  Filled 2022-01-30: qty 100

## 2022-01-30 MED ORDER — FENTANYL CITRATE (PF) 250 MCG/5ML IJ SOLN
INTRAMUSCULAR | Status: AC
  Filled 2022-01-30: qty 5

## 2022-01-30 MED ORDER — BUPIVACAINE-EPINEPHRINE (PF) 0.5% -1:200000 IJ SOLN
INTRAMUSCULAR | Status: DC | PRN
  Administered 2022-01-30: 10 mL

## 2022-01-30 MED ORDER — SUGAMMADEX SODIUM 200 MG/2ML IV SOLN
INTRAVENOUS | Status: DC | PRN
  Administered 2022-01-30: 200 mg via INTRAVENOUS

## 2022-01-30 MED ORDER — ACETAMINOPHEN 500 MG PO TABS
1000.0000 mg | ORAL_TABLET | Freq: Once | ORAL | Status: AC
  Administered 2022-01-30: 1000 mg via ORAL
  Filled 2022-01-30: qty 2

## 2022-01-30 MED ORDER — CEFAZOLIN SODIUM-DEXTROSE 2-4 GM/100ML-% IV SOLN
2.0000 g | Freq: Three times a day (TID) | INTRAVENOUS | Status: AC
  Administered 2022-01-30 – 2022-01-31 (×2): 2 g via INTRAVENOUS
  Filled 2022-01-30 (×3): qty 100

## 2022-01-30 MED ORDER — OXYCODONE HCL 5 MG PO TABS: 5.0000 mg | ORAL_TABLET | Freq: Once | ORAL | Status: DC | PRN

## 2022-01-30 MED ORDER — PROPOFOL 10 MG/ML IV BOLUS
INTRAVENOUS | Status: DC | PRN
  Administered 2022-01-30: 150 mg via INTRAVENOUS

## 2022-01-30 MED ORDER — 0.9 % SODIUM CHLORIDE (POUR BTL) OPTIME
TOPICAL | Status: DC | PRN
  Administered 2022-01-30: 1000 mL

## 2022-01-30 MED ORDER — MEPERIDINE HCL 25 MG/ML IJ SOLN: 6.2500 mg | INTRAMUSCULAR | Status: DC | PRN

## 2022-01-30 MED ORDER — HYDROMORPHONE HCL 1 MG/ML IJ SOLN
INTRAMUSCULAR | Status: AC
  Filled 2022-01-30: qty 1

## 2022-01-30 MED ORDER — ACETAMINOPHEN 650 MG RE SUPP: 650.0000 mg | RECTAL | Status: DC | PRN

## 2022-01-30 MED ORDER — ORAL CARE MOUTH RINSE
15.0000 mL | Freq: Once | OROMUCOSAL | Status: AC
Start: 2022-01-30 — End: 2022-01-30

## 2022-01-30 MED ORDER — SODIUM CHLORIDE 0.9% FLUSH
3.0000 mL | Freq: Two times a day (BID) | INTRAVENOUS | Status: DC
Start: 2022-01-30 — End: 2022-02-01
  Administered 2022-01-30 – 2022-02-01 (×4): 3 mL via INTRAVENOUS

## 2022-01-30 MED ORDER — METHOCARBAMOL 1000 MG/10ML IJ SOLN
500.0000 mg | Freq: Four times a day (QID) | INTRAVENOUS | Status: DC | PRN
  Filled 2022-01-30: qty 5

## 2022-01-30 MED ORDER — MIDAZOLAM HCL 2 MG/2ML IJ SOLN: 0.5000 mg | Freq: Once | INTRAMUSCULAR | Status: DC | PRN

## 2022-01-30 MED ORDER — ONDANSETRON HCL 4 MG/2ML IJ SOLN
INTRAMUSCULAR | Status: AC
  Filled 2022-01-30: qty 2

## 2022-01-30 MED ORDER — METOPROLOL TARTRATE 5 MG/5ML IV SOLN
INTRAVENOUS | Status: DC | PRN
  Administered 2022-01-30 (×2): 2.5 mg via INTRAVENOUS

## 2022-01-30 MED ORDER — PROPOFOL 10 MG/ML IV BOLUS
INTRAVENOUS | Status: AC
  Filled 2022-01-30: qty 20

## 2022-01-30 MED ORDER — METHOCARBAMOL 500 MG PO TABS
500.0000 mg | ORAL_TABLET | Freq: Four times a day (QID) | ORAL | Status: DC | PRN
  Administered 2022-01-30 (×2): 500 mg via ORAL
  Filled 2022-01-30 (×2): qty 1

## 2022-01-30 MED ORDER — KETOROLAC TROMETHAMINE 30 MG/ML IJ SOLN
INTRAMUSCULAR | Status: AC
  Filled 2022-01-30: qty 1

## 2022-01-30 MED ORDER — THROMBIN 5000 UNITS EX SOLR
CUTANEOUS | Status: DC | PRN
  Administered 2022-01-30: 10000 [IU] via TOPICAL

## 2022-01-30 MED ORDER — THROMBIN 5000 UNITS EX SOLR
CUTANEOUS | Status: AC
  Filled 2022-01-30: qty 15000

## 2022-01-30 SURGICAL SUPPLY — 63 items
ADH SKN CLS APL DERMABOND .7 (GAUZE/BANDAGES/DRESSINGS) ×1
BAG COUNTER SPONGE SURGICOUNT (BAG) ×3 IMPLANT
BAG SPNG CNTER NS LX DISP (BAG) ×1
BAND INSRT 18 STRL LF DISP RB (MISCELLANEOUS) ×2
BAND RUBBER #18 3X1/16 STRL (MISCELLANEOUS) ×6 IMPLANT
BUR CARBIDE MATCH 3.0 (BURR) ×3 IMPLANT
CNTNR URN SCR LID CUP LEK RST (MISCELLANEOUS) ×2 IMPLANT
CONT SPEC 4OZ STRL OR WHT (MISCELLANEOUS) ×2
COVER MAYO STAND STRL (DRAPES) ×3 IMPLANT
DERMABOND ADVANCED (GAUZE/BANDAGES/DRESSINGS) ×1
DERMABOND ADVANCED .7 DNX12 (GAUZE/BANDAGES/DRESSINGS) IMPLANT
DRAIN JACKSON RD 7FR 3/32 (WOUND CARE) IMPLANT
DRAPE C-ARM 42X72 X-RAY (DRAPES) ×3 IMPLANT
DRAPE INCISE IOBAN 66X45 STRL (DRAPES) ×1 IMPLANT
DRAPE LAPAROTOMY 100X72X124 (DRAPES) ×3 IMPLANT
DRAPE MICROSCOPE LEICA (MISCELLANEOUS) ×3 IMPLANT
DRAPE SURG 17X23 STRL (DRAPES) ×3 IMPLANT
DRSG OPSITE POSTOP 3X4 (GAUZE/BANDAGES/DRESSINGS) ×1 IMPLANT
DURAPREP 26ML APPLICATOR (WOUND CARE) ×3 IMPLANT
ELECT BLADE INSULATED 4IN (ELECTROSURGICAL) ×2
ELECT REM PT RETURN 9FT ADLT (ELECTROSURGICAL) ×2
ELECTRODE BLADE INSULATED 4IN (ELECTROSURGICAL) ×2 IMPLANT
ELECTRODE REM PT RTRN 9FT ADLT (ELECTROSURGICAL) ×2 IMPLANT
EVACUATOR 1/8 PVC DRAIN (DRAIN) IMPLANT
GAUZE 4X4 16PLY ~~LOC~~+RFID DBL (SPONGE) IMPLANT
GAUZE SPONGE 4X4 12PLY STRL (GAUZE/BANDAGES/DRESSINGS) ×3 IMPLANT
GLOVE BIOGEL PI IND STRL 8 (GLOVE) ×2 IMPLANT
GLOVE BIOGEL PI INDICATOR 8 (GLOVE) ×1
GLOVE ECLIPSE 8.0 STRL XLNG CF (GLOVE) ×3 IMPLANT
GLOVE SURG ENC MOIS LTX SZ8 (GLOVE) ×3 IMPLANT
GLOVE SURG UNDER POLY LF SZ8.5 (GLOVE) ×3 IMPLANT
GOWN STRL REUS W/ TWL LRG LVL3 (GOWN DISPOSABLE) IMPLANT
GOWN STRL REUS W/ TWL XL LVL3 (GOWN DISPOSABLE) ×4 IMPLANT
GOWN STRL REUS W/TWL 2XL LVL3 (GOWN DISPOSABLE) IMPLANT
GOWN STRL REUS W/TWL LRG LVL3 (GOWN DISPOSABLE)
GOWN STRL REUS W/TWL XL LVL3 (GOWN DISPOSABLE) ×4
GRAFT DURAGEN MATRIX 1WX1L (Tissue) ×1 IMPLANT
HEMOSTAT POWDER KIT SURGIFOAM (HEMOSTASIS) ×3 IMPLANT
HEMOSTAT POWDER SURGIFOAM 1G (HEMOSTASIS) ×1 IMPLANT
KIT BASIN OR (CUSTOM PROCEDURE TRAY) ×3 IMPLANT
KIT POSITION SURG JACKSON T1 (MISCELLANEOUS) ×3 IMPLANT
KIT TURNOVER KIT B (KITS) ×3 IMPLANT
MARKER SKIN DUAL TIP RULER LAB (MISCELLANEOUS) ×3 IMPLANT
NDL HYPO 25X1 1.5 SAFETY (NEEDLE) ×2 IMPLANT
NEEDLE HYPO 25X1 1.5 SAFETY (NEEDLE) ×2 IMPLANT
NS IRRIG 1000ML POUR BTL (IV SOLUTION) ×3 IMPLANT
PACK LAMINECTOMY NEURO (CUSTOM PROCEDURE TRAY) ×3 IMPLANT
PAD ARMBOARD 7.5X6 YLW CONV (MISCELLANEOUS) ×9 IMPLANT
PATTIES SURGICAL .5 X.5 (GAUZE/BANDAGES/DRESSINGS) IMPLANT
PATTIES SURGICAL .5 X1 (DISPOSABLE) IMPLANT
PATTIES SURGICAL 1X1 (DISPOSABLE) IMPLANT
SEALANT ADHERUS EXTEND TIP (MISCELLANEOUS) ×1 IMPLANT
SPIKE FLUID TRANSFER (MISCELLANEOUS) ×3 IMPLANT
SPONGE SURGIFOAM ABS GEL SZ50 (HEMOSTASIS) ×3 IMPLANT
SPONGE T-LAP 4X18 ~~LOC~~+RFID (SPONGE) IMPLANT
SUT PROLENE 6 0 BV (SUTURE) ×1 IMPLANT
SUT VIC AB 0 CT1 18XCR BRD8 (SUTURE) ×2 IMPLANT
SUT VIC AB 0 CT1 8-18 (SUTURE) ×2
SUT VIC AB 2-0 CP2 18 (SUTURE) ×3 IMPLANT
SUT VIC AB 3-0 SH 8-18 (SUTURE) ×4 IMPLANT
TOWEL GREEN STERILE (TOWEL DISPOSABLE) IMPLANT
TOWEL GREEN STERILE FF (TOWEL DISPOSABLE) IMPLANT
WATER STERILE IRR 1000ML POUR (IV SOLUTION) ×3 IMPLANT

## 2022-01-30 NOTE — Anesthesia Preprocedure Evaluation (Addendum)
Anesthesia Evaluation  Patient identified by MRN, date of birth, ID band Patient awake    Reviewed: Allergy & Precautions, NPO status , Patient's Chart, lab work & pertinent test results  Airway Mallampati: II  TM Distance: >3 FB Neck ROM: Full    Dental  (+) Teeth Intact, Dental Advisory Given   Pulmonary asthma , Current Smoker and Patient abstained from smoking.,    breath sounds clear to auscultation       Cardiovascular hypertension,  Rhythm:Regular Rate:Normal     Neuro/Psych  Headaches, Anxiety Depression Back pain s/p microdisc: pseudomeningocele/CSF leak    GI/Hepatic GERD  ,  Endo/Other  obese  Renal/GU      Musculoskeletal   Abdominal Normal abdominal exam  (+)   Peds  Hematology   Anesthesia Other Findings   Reproductive/Obstetrics                          Anesthesia Physical Anesthesia Plan  ASA: 3  Anesthesia Plan: General   Post-op Pain Management: Tylenol PO (pre-op)*   Induction: Intravenous  PONV Risk Score and Plan: 2 and Ondansetron and Dexamethasone  Airway Management Planned: Oral ETT  Additional Equipment: None  Intra-op Plan:   Post-operative Plan: Extubation in OR  Informed Consent: I have reviewed the patients History and Physical, chart, labs and discussed the procedure including the risks, benefits and alternatives for the proposed anesthesia with the patient or authorized representative who has indicated his/her understanding and acceptance.     Dental advisory given  Plan Discussed with:   Anesthesia Plan Comments:       Anesthesia Quick Evaluation

## 2022-01-30 NOTE — Anesthesia Procedure Notes (Signed)
Procedure Name: Intubation Date/Time: 01/30/2022 12:40 PM  Performed by: Terrence Dupont, CRNAPre-anesthesia Checklist: Patient identified, Emergency Drugs available, Suction available and Patient being monitored Patient Re-evaluated:Patient Re-evaluated prior to induction Oxygen Delivery Method: Circle system utilized Preoxygenation: Pre-oxygenation with 100% oxygen Induction Type: IV induction Ventilation: Mask ventilation without difficulty Laryngoscope Size: Mac and 4 Grade View: Grade I Tube type: Oral Tube size: 7.0 mm Number of attempts: 1 Airway Equipment and Method: Stylet and Oral airway Placement Confirmation: ETT inserted through vocal cords under direct vision, positive ETCO2 and breath sounds checked- equal and bilateral Secured at: 21 cm Tube secured with: Tape Dental Injury: Teeth and Oropharynx as per pre-operative assessment

## 2022-01-30 NOTE — Op Note (Signed)
   Providing Compassionate, Quality Care - Together  Date of service: 01/30/2022  PREOP DIAGNOSIS:  Lumbosacral CSF leak  POSTOP DIAGNOSIS: Same  PROCEDURE: Revision right L5-S1 laminotomy, primary repair of durotomy and CSF leak Intraoperative use of microscope for microdissection  SURGEON: Dr. Pieter Partridge C. Bettymae Yott, DO  ASSISTANT: Weston Brass, NP  ANESTHESIA: General Endotracheal  EBL: 50 cc  SPECIMENS: None  DRAINS: None  COMPLICATIONS: None  CONDITION: Hemodynamically stable  HISTORY: Kaylee Miller is a 31 y.o. female who underwent a right L5-S1 microdiscectomy by Dr. Christella Miller approximately 2 weeks ago.  She began having worsening positional headaches over the past week, that were severe and brought her to the emergency department.  MRI was obtained which showed a large fluid collection in the soft tissues extending from the laminotomy site concerning for CSF leak.  Given her severe headaches and low back pain, I recommended surgical intervention in the form of a revision laminotomy, repair of the durotomy and CSF leak.  We discussed all risks, benefits and expected outcomes, we also discussed possibly placing a lumbar drain if needed.  Informed consent was obtained.  PROCEDURE IN DETAIL: The patient was brought to the operating room. After induction of general anesthesia, the patient was positioned on the operative table in the prone position. All pressure points were meticulously padded. Skin incision was then marked out and prepped and draped in the usual sterile fashion. Physician driven timeout was performed.  Previous incision was opened sharply with a 10 blade.  There is a large clear fluid collection that was clearly under pressure that was evacuated with suction.  Dissection was carried down to the lumbodorsal fascia, the previous fascia was opened sharply.  Deep retractors were placed in the wound.  The L5 and S1 lamina were identified and the laminotomy site was  identified.  There was dural glue which was removed with suction.  There was clear CSF coming from a durotomy in which the dural repair suture appeared to have broken.  This was removed.  The microscope was sterilely draped and brought into the field for the remainder the procedure.  A small portion of the inferior L5 and superior S1 lamina were removed with a high-speed drill.  There was 1 small nerve root that was slightly bulging of the durotomy site, that was gently reduced with a Penfield 4.  A small piece of DuraGen was cut appropriately to fit the durotomy site and placed within the durotomy site.  3 simple interrupted sutures were placed to repair the durotomy with a 6-0 Prolene suture.  The dura was then no longer leaking, the patient was placed under Valsalva maneuver for approximately 10 seconds and there was no egress of CSF.  Epidural hemostasis was achieved with Surgifoam. Adherus glue was placed over the durotomy repair.  Again there is no egress of CSF.  Soft tissue hemostasis was achieved with bipolar cautery.  The wound was then closed in layers, fascia was closed with 0 Vicryl sutures.  Dermis was closed with 2-0 and 3-0 Vicryl sutures.  Skin was closed with skin glue.  Sterile dressing was applied.  At the end of the case all sponge, needle, and instrument counts were correct. The patient was then transferred to the stretcher, extubated, and taken to the post-anesthesia care unit in stable hemodynamic condition.

## 2022-01-30 NOTE — Anesthesia Postprocedure Evaluation (Signed)
Anesthesia Post Note  Patient: Kaylee Miller  Procedure(s) Performed: REVISION L5-S1 LAMINECTOMY REPAIR CSS LEAK     Patient location during evaluation: PACU Anesthesia Type: General Level of consciousness: awake and alert Pain management: pain level controlled Vital Signs Assessment: post-procedure vital signs reviewed and stable Respiratory status: spontaneous breathing, nonlabored ventilation, respiratory function stable and patient connected to nasal cannula oxygen Cardiovascular status: blood pressure returned to baseline and stable Postop Assessment: no apparent nausea or vomiting Anesthetic complications: no   No notable events documented.  Last Vitals:  Vitals:   01/30/22 1525 01/30/22 1600  BP: 101/62 115/76  Pulse: 62 67  Resp: 13 16  Temp: 36.7 C 36.9 C  SpO2: 99% 99%    Last Pain:  Vitals:   01/30/22 1653  TempSrc:   PainSc: Bronson Don Giarrusso

## 2022-01-30 NOTE — Progress Notes (Signed)
   Providing Compassionate, Quality Care - Together  NEUROSURGERY PROGRESS NOTE   S: patient continues to have postural HA and swelling around incision with tenderness  O: EXAM:  BP (!) 102/53 (BP Location: Right Arm)   Pulse 66   Temp 98.3 F (36.8 C) (Oral)   Resp 17   Ht '5\' 7"'$  (1.702 m)   Wt 90.3 kg   LMP 01/03/2022 (Exact Date)   SpO2 98%   BMI 31.18 kg/m   Awake, alert, oriented x3 PERRL Speech fluent, appropriate  CNs grossly intact  5/5 BUE/BLE  Incision well-healed, no erythema, slight fullness and tenderness to palpation  ASSESSMENT:  31 y.o. female with   CSF leak, lumbar  PLAN: -OR today for L5-S1 revision laminotomy, repair of CSF leak, possible lumbar drain placement.  I discussed all risks, benefits and expected outcomes as well as alternatives to treatment with the patient and her family at bedside.  Answered all of her questions.  She would like to proceed with surgical intervention today.    Thank you for allowing me to participate in this patient's care.  Please do not hesitate to call with questions or concerns.   Elwin Sleight, [facility] Cell: [phone]

## 2022-01-30 NOTE — Transfer of Care (Signed)
Immediate Anesthesia Transfer of Care Note  Patient: Kaylee Miller  Procedure(s) Performed: REVISION L5-S1 LAMINECTOMY REPAIR CSS LEAK  Patient Location: PACU  Anesthesia Type:General  Level of Consciousness: awake, alert  and oriented  Airway & Oxygen Therapy: Patient Spontanous Breathing  Post-op Assessment: Report given to RN, Post -op Vital signs reviewed and stable and Patient moving all extremities X 4  Post vital signs: Reviewed and stable  Last Vitals:  Vitals Value Taken Time  BP 114/62 01/30/22 1440  Temp    Pulse 96 01/30/22 1441  Resp    SpO2 99 % 01/30/22 1441  Vitals shown include unvalidated device data.  Last Pain:  Vitals:   01/30/22 1020  TempSrc:   PainSc: 5       Patients Stated Pain Goal: 3 (83/77/93 9688)  Complications: No notable events documented.

## 2022-01-30 NOTE — Progress Notes (Signed)
Patient arrived in the unit at 2237 on 01/30/2032 from [facility], A&O x4, c/o having pain, will assess thoroughly and treat pain as finding, initiated telemetry monitor, and will continue to monitor closely.

## 2022-01-30 NOTE — Progress Notes (Signed)
Continues to have posterior headache, lying flat. Wound flat and dry. Dr Reatha Armour to see today and he may fix the leak today.

## 2022-01-31 ENCOUNTER — Encounter (HOSPITAL_COMMUNITY): Payer: Self-pay | Admitting: Neurological Surgery

## 2022-01-31 NOTE — Plan of Care (Signed)

## 2022-01-31 NOTE — Progress Notes (Addendum)
Subjective: Patient reports incisional discomfort due to laying flat. No other complaints. No acute events overnight.   Objective: Vital signs in last 24 hours: Temp:  [97.4 F (36.3 C)-98.8 F (37.1 C)] 98.5 F (36.9 C) (06/09 0324) Pulse Rate:  [62-97] 73 (06/09 0324) Resp:  [13-19] 18 (06/09 0324) BP: (101-123)/(62-76) 116/65 (06/09 0324) SpO2:  [96 %-100 %] 100 % (06/09 0324) Weight:  [90.3 kg] 90.3 kg (06/08 1020)  Intake/Output from previous day: 06/08 0701 - 06/09 0700 In: 1816.5 [I.V.:1716.5; IV Piggyback:100] Out: 1050 [Urine:1000; Blood:50] Intake/Output this shift: Total I/O In: 151.3 [I.V.:51.3; IV Piggyback:100] Out: 1000 [Urine:1000]  Physical Exam: Patient is awake, A/O X 4, conversant, and in good spirits. Eyes open spontaneously. They are in NAD and VSS. Doing well. Speech is fluent and appropriate. MAEW with good strength that is symmetric bilaterally.  BUE 5/5 throughout, BLE 5/5 throughout. Sensation to light touch is intact. PERLA, EOMI. CNs grossly intact. Dressing is clean dry intact. Incision is well approximated with no drainage, erythema, or edema.    Lab Results: Recent Labs    01/29/22 1147  WBC 8.1  HGB 14.4  HCT 44.2  PLT 227   BMET Recent Labs    01/29/22 1147  NA 137  K 4.0  CL 108  CO2 23  GLUCOSE 94  BUN 11  CREATININE 0.73  CALCIUM 9.3    Studies/Results: MR LUMBAR SPINE WO CONTRAST  Result Date: 01/29/2022 CLINICAL DATA:  Lumbar surgery 2 weeks ago, back pain, CSF leak EXAM: MRI LUMBAR SPINE WITHOUT CONTRAST TECHNIQUE: Multiplanar, multisequence MR imaging of the lumbar spine was performed. No intravenous contrast was administered. COMPARISON:  MR lumbar spine FINDINGS: Segmentation: The lowest well-formed disc space is designated as L5-S1, consistent with prior MRIs. Alignment:  Physiologic. Vertebrae: Postsurgical changes of recent right laminectomy at L5-S1. No fracture, evidence of discitis, or aggressive bone lesion. Conus  medullaris and cauda equina: Conus extends to the L1 level. Conus and cauda equina appear normal. Paraspinal and other soft tissues: There is a subcutaneous fluid collection measures 8.1 x 2.7 x 6.1 cm which extends to connect with the laminectomy site at L5-S1 (sagittal stir image 11, axial T2 image 26). Disc levels: T11-T12: No significant spinal canal or neural foraminal narrowing. T12-L1: No significant spinal canal or neural foraminal narrowing. L1-L2: No significant spinal canal or neural foraminal narrowing. L2-L3: No significant spinal canal or neural foraminal narrowing. L3-L4: No significant spinal canal or neural foraminal narrowing. L4-L5: Minimal disc bulging.  No significant stenosis. L5-S1: Prior right hemilaminectomy. There is ligamentum flavum thinning with fluid collection at the laminectomy site measuring 1.8 x 1.8 x 1.5 cm, which communicates with the subcutaneous collection previously mentioned. The fluid collection results in no significant mass effect. There is disc height loss with residual disc bulging at L5-S1 but improved patency of the right subarticular zone. IMPRESSION: Paraspinal fluid collection at the right hemilaminectomy site measuring 1.8 x 1.8 x 1.5 cm, communicating with a subcutaneous collection measuring 8.1 x 2.7 x 6.1 cm. This could represent a postoperative seroma or potentially sequela of CSF leak. Residual disc bulging but overall improved patency of the right subarticular zone at L5-S1. Electronically Signed   By: Maurine Simmering M.D.   On: 01/29/2022 14:39   MR BRAIN WO CONTRAST  Result Date: 01/29/2022 CLINICAL DATA:  None EXAM: MRI HEAD WITHOUT CONTRAST TECHNIQUE: Multiplanar, multiecho pulse sequences of the brain and surrounding structures were obtained without intravenous contrast. COMPARISON:  None FINDINGS: Brain:  Brain appears normally formed. There is no evidence of old or acute infarction, mass lesion, hemorrhage, hydrocephalus or extra-axial collection. No  imaging evidence of brainstem sagging. Vascular: Major vessels at the base of the brain show flow. Skull and upper cervical spine: Negative Sinuses/Orbits: Clear/normal Other: None IMPRESSION: Normal appearance of the brain itself. No imaging finding of intracranial hypotension. No brainstem sagging. Electronically Signed   By: Nelson Chimes M.D.   On: 01/29/2022 14:00    Assessment/Plan: 31 y.o. female with CSF leak who is postop day # 1 s/p L5-S1 revision laminotomy with repair of CSF leak. The patient is doing well and has had a resolution of her headaches. She has appropriate incisional discomfort. No drainage noted at surgical site. Neuro exam is intact and at her baseline. She will need to continue woth HOB flat until 1400 hours today. If she does well overnight tonight, she will potentially be ready for discharge tomorrow.   -HOB flat until 1400 today -Pain control -PT    LOS: 2 days     Marvis Moeller, DNP, AGNP-C Neurosurgery Nurse Practitioner  [facility] [place]. [address], [place], [place], [place] [address] P: [phone]    F: [phone]  01/31/2022 5:54 AM   Addendum:  Patient seen and examined.  Agree with above.  Denies headaches, remains with head of bed flat.  Will remove restrictions of lying flat at approximately 2 PM.  Likely plan for discharge home tomorrow if she continues to improve.  Thank you for allowing me to participate in this patient's care.  Please do not hesitate to call with questions or concerns.   Elwin Sleight, [facility] Cell: [phone]

## 2022-01-31 NOTE — Progress Notes (Signed)
  Transition of Care [facility]) Screening Note   Patient Details  Name: Kaylee Miller Date of Birth: 1991/05/24   [facility]) CM/SW Contact:    Pollie Friar, RN Phone Number: 01/31/2022, 2:56 PM    Transition of Care Department [facility]) has reviewed patient. We will continue to monitor patient advancement through interdisciplinary progression rounds. If new patient transition needs arise, please place a TOC consult. Awaiting for PT eval.  Pt is from home with her parent.  S/p L5-S1 revision.

## 2022-01-31 NOTE — Evaluation (Addendum)
Physical Therapy Evaluation/DC  Patient Details Name: Kaylee Miller MRN: [id number] DOB: Feb 17, 1991 Today's Date: 01/31/2022  History of Present Illness  Pt adm 6/7 with headache and back pain after prior lumbar microdiscetomy on 01/15/2022. Pt found to have CSF leak and underwent surgical repair on 01/30/22. PMH - back surgery.  Clinical Impression  Pt did well with mobilizing after off of bedrest and denies any headache or back pain. OK for dc home from PT standpoint when medically ready.        Recommendations for follow up therapy are one component of a multi-disciplinary discharge planning process, led by the attending physician.  Recommendations may be updated based on patient status, additional functional criteria and insurance authorization.  Follow Up Recommendations No PT follow up    Assistance Recommended at Discharge PRN  Patient can return home with the following  Help with stairs or ramp for entrance    Equipment Recommendations BSC/3in1  Recommendations for Other Services       Functional Status Assessment Patient has not had a recent decline in their functional status     Precautions / Restrictions Precautions Precautions: Back      Mobility  Bed Mobility Overal bed mobility: Modified Independent             General bed mobility comments: Verbal cues to come to side and then sit to protect back    Transfers Overall transfer level: Modified independent Equipment used: Rolling walker (2 wheels), None               General transfer comment: Incr time to rise. Use of grab bar to rise from commode    Ambulation/Gait Ambulation/Gait assistance: Supervision, Modified independent (Device/Increase time) Gait Distance (Feet): 175 Feet Assistive device: Rolling walker (2 wheels), None Gait Pattern/deviations: Step-through pattern Gait velocity: decr Gait velocity interpretation: 1.31 - 2.62 ft/sec, indicative of limited community ambulator    General Gait Details: Initial supervision for safety progressing to modified independent. Initially used walker and then progressed to no device once back in room.  Stairs            Wheelchair Mobility    Modified Rankin (Stroke Patients Only)       Balance Overall balance assessment: No apparent balance deficits (not formally assessed)                                           Pertinent Vitals/Pain Pain Assessment Pain Assessment: No/denies pain    Home Living Family/patient expects to be discharged to:: Private residence Living Arrangements: Parent;Children Available Help at Discharge: Family;Available PRN/intermittently Type of Home: House Home Access: Stairs to enter Entrance Stairs-Rails: None Entrance Stairs-Number of Steps: 3   Home Layout: One level Home Equipment: Conservation officer, nature (2 wheels);Cane - single point      Prior Function Prior Level of Function : Independent/Modified Independent             Mobility Comments: Using a cane at times       Hand Dominance        Extremity/Trunk Assessment   Upper Extremity Assessment Upper Extremity Assessment: Overall WFL for tasks assessed    Lower Extremity Assessment Lower Extremity Assessment: Overall WFL for tasks assessed       Communication   Communication: No difficulties  Cognition Arousal/Alertness: Awake/alert Behavior During Therapy: WFL for tasks assessed/performed Overall Cognitive Status:  Within Functional Limits for tasks assessed                                          General Comments General comments (skin integrity, edema, etc.): VSS on RA    Exercises     Assessment/Plan    PT Assessment Patient does not need any further PT services  PT Problem List         PT Treatment Interventions      PT Goals (Current goals can be found in the Care Plan section)  Acute Rehab PT Goals Patient Stated Goal: return home PT Goal  Formulation: All assessment and education complete, DC therapy    Frequency       Co-evaluation               AM-PAC PT "6 Clicks" Mobility  Outcome Measure Help needed turning from your back to your side while in a flat bed without using bedrails?: None Help needed moving from lying on your back to sitting on the side of a flat bed without using bedrails?: None Help needed moving to and from a bed to a chair (including a wheelchair)?: None Help needed standing up from a chair using your arms (e.g., wheelchair or bedside chair)?: None Help needed to walk in hospital room?: None Help needed climbing 3-5 steps with a railing? : A Little 6 Click Score: 23    End of Session   Activity Tolerance: Patient tolerated treatment well Patient left: in chair;with call bell/phone within reach;with family/visitor present Nurse Communication: Mobility status PT Visit Diagnosis: Other abnormalities of gait and mobility (R26.89)    Time: 1428-1450 PT Time Calculation (min) (ACUTE ONLY): 22 min   Charges:   PT Evaluation $PT Eval Low Complexity: [place] [facility] [place] 01/31/2022, 3:56 PM

## 2022-02-01 NOTE — Plan of Care (Signed)
  Problem: Pain Managment: Goal: General experience of comfort will improve Outcome: Progressing   Problem: Safety: Goal: Ability to remain free from injury will improve Outcome: Progressing   Problem: Skin Integrity: Goal: Risk for impaired skin integrity will decrease Outcome: Progressing   

## 2022-02-01 NOTE — Progress Notes (Signed)
AVS given and reviewed with pt. Medications discussed. All questions answered to satisfaction. Pt verbalized understanding of information given. Pt escorted off the unit with all belongings via wheelchair by staff member.  

## 2022-02-01 NOTE — Progress Notes (Signed)
3n1 delivered to bedside.

## 2022-02-01 NOTE — TOC Transition Note (Signed)
Transition of Care [facility]) - CM/SW Discharge Note   Patient Details  Name: Kaylee Miller MRN: [id number] Date of Birth: Nov 24, 1990  Transition of Care [facility]) CM/SW Contact:  Carles Collet, RN Phone Number: 02/01/2022, 8:40 AM   Clinical Narrative:   Spoke w patient over the phone, she would like a 3/1. This has been ordered through [employer] to be delivered to her room before she leaves. Nurse updated.  No other TOC needs identified.     Final next level of care: Home/Self Care Barriers to Discharge: No Barriers Identified   Patient Goals and CMS Choice Patient states their goals for this hospitalization and ongoing recovery are:: to go home      Discharge Placement                       Discharge Plan and Services                DME Arranged: 3-N-1 DME Agency: [employer] Date DME Agency Contacted: 02/01/22 Time DME Agency Contacted: 69              Social Determinants of Health (SDOH) Interventions     Readmission Risk Interventions     No data to display

## 2022-02-01 NOTE — Discharge Instructions (Signed)

## 2022-02-01 NOTE — Discharge Summary (Signed)
Physician Discharge Summary  Patient ID: Kaylee Miller MRN: [id number] DOB/AGE: 02/18/1991 31 y.o.  Admit date: 01/29/2022 Discharge date: 02/01/2022  Admission Diagnoses:  Discharge Diagnoses:  Principal Problem:   CSF leak   Discharged Condition: good  Hospital Course: Patient admitted to the hospital and taken to the operating room for reexploration of lumbar wound and closure of CSF fistula.  Postoperatively doing well.  Back pain minimal.  She is not having any headaches.  She is having no wound drainage.  She is mobilized slowly without difficulty.  She is ready for discharge home.  Consults:   Significant Diagnostic Studies:   Treatments:   Discharge Exam: Blood pressure 109/67, pulse 70, temperature 98.2 F (36.8 C), temperature source Oral, resp. rate 18, height '5\' 7"'$  (1.702 m), weight 90.3 kg, last menstrual period 01/03/2022, SpO2 98 %. Awake and alert.  Oriented and appropriate.  Motor and sensory function intact.  Wound clean and dry.  Chest and abdomen benign.  Disposition: Discharge disposition: 01-Home or Self Care        Allergies as of 02/01/2022       Reactions   Latex Swelling        Medication List     TAKE these medications    albuterol 108 (90 Base) MCG/ACT inhaler Commonly known as: VENTOLIN HFA Inhale 2 puffs into the lungs every 6 (six) hours as needed for wheezing or shortness of breath.   aspirin-acetaminophen-caffeine 250-250-65 MG tablet Commonly known as: EXCEDRIN MIGRAINE Take 2 tablets by mouth every 6 (six) hours as needed for migraine.   docusate sodium 100 MG capsule Commonly known as: COLACE Take 200 mg by mouth daily.   escitalopram 10 MG tablet Commonly known as: Lexapro Take 1 tablet (10 mg total) by mouth daily.   fluticasone 50 MCG/ACT nasal spray Commonly known as: FLONASE Place 2 sprays into both nostrils daily as needed for allergies or rhinitis.   HYDROcodone-acetaminophen 5-325 MG tablet Commonly  known as: NORCO/VICODIN Take 1 tablet by mouth every 6 (six) hours as needed for severe pain.   ibuprofen 200 MG tablet Commonly known as: ADVIL Take 800 mg by mouth every 6 (six) hours as needed for cramping.   meclizine 25 MG tablet Commonly known as: ANTIVERT Take 1 tablet (25 mg total) by mouth 3 (three) times daily as needed for dizziness.   medroxyPROGESTERone Acetate 150 MG/ML Susy Inject 1 mL (150 mg total) into the muscle every 3 (three) months. INJECT 1 ML INTO THE MUSCLE EVERY 3 MONTHS Strength: 150 mg/mL   Melatonin 5 MG Caps Take 5 mg by mouth at bedtime as needed (sleep).   ondansetron 4 MG disintegrating tablet Commonly known as: Zofran ODT Take 1 tablet (4 mg total) by mouth every 8 (eight) hours as needed for nausea or vomiting.   oxyCODONE 5 MG immediate release tablet Commonly known as: Oxy IR/ROXICODONE Take 5 mg by mouth 4 (four) times daily as needed.   SUMAtriptan 50 MG tablet Commonly known as: IMITREX TAKE 1 TABLET BY MOUTH EVERY 2 HOURS AS NEEDED FOR MIGRAINE. MAY REPEAT IN 2 HOURS IF HEADACHE PERSISTS OR RECURS. MAXIMUM OF 2 DOSES. What changed: See the new instructions.   tiZANidine 4 MG tablet Commonly known as: ZANAFLEX Take 1 tablet (4 mg total) by mouth every 6 (six) hours as needed for muscle spasms.   Vitamin D (Ergocalciferol) 1.25 MG (50000 UNIT) Caps capsule Commonly known as: DRISDOL Take 1 capsule (50,000 Units total) by mouth every 7 (  seven) days.         Signed: Cooper Render Shizuye Rupert 02/01/2022, 8:29 AM

## 2022-02-03 ENCOUNTER — Telehealth: Payer: Self-pay

## 2022-02-03 DIAGNOSIS — G4452 New daily persistent headache (NDPH): Secondary | ICD-10-CM | POA: Insufficient documentation

## 2022-02-03 NOTE — Assessment & Plan Note (Signed)
1 week h/o constant New headache, 2 weeks post c spine surgery, needs urgent re eval by Neurosurgery, I have spoken with local ED physician, who will accept p tevaluate and manage as deeme necessary. Pt and Father understand and agree with the plan

## 2022-02-03 NOTE — Progress Notes (Signed)
   Kaylee Miller     MRN: [id number]      DOB: 08-10-1991   HPI Kaylee Miller is here accompanied by her father, with a 1 week h/o constant disabling headache rated at a 10/10 having had lumbar  spine surgery 2 weeks ago, No other neurologic symptoms Does have chronic headaches , but none similar to current headache Was evaluated recently  in urgent care,and has attempted to contact Neurosurgeon with no success     ROS Denies recent fever or chills. Denies sinus pressure, nasal congestion, ear pain or sore throat. Denies chest congestion, productive cough or wheezing. Denies chest pains, palpitations and leg swelling Denies abdominal pain, nausea, vomiting,diarrhea or constipation.   Denies dysuria, frequency, hesitancy or incontinence. Denies joint pain, swelling and limitation in mobility. . . Denies drainage or excessive pain at op site.   PE  BP 119/75   Pulse 92   Resp 16   Ht '5\' 7"'$  (1.702 m)   Wt 199 lb 12.8 oz (90.6 kg)   LMP 01/03/2022 (Exact Date)   SpO2 98%   BMI 31.29 kg/m   Patient alert and oriented and in no cardiopulmonary distress.Pt in pain  HEENT: No facial asymmetry, EOMI,     Neck decreased ROM Chest: Clear to auscultation bilaterally.  CVS: S1, S2 no murmurs, no S3.Regular rate.  ABD: Soft non tender.   Ext: No edema  Psych: Good eye contact, normal affect. Memory intact not anxious or depressed appearing.  CNS: CN 2-12 intact, power,  normal throughout.no focal deficits noted.   Assessment & Plan  New daily persistent headache (ndph)  1 week h/o constant New headache, 2 weeks post c spine surgery, needs urgent re eval by Neurosurgery, I have spoken with local ED physician, who will accept p tevaluate and manage as deeme necessary. Pt and Father understand and agree with the plan

## 2022-02-03 NOTE — Telephone Encounter (Signed)
Transition Care Management Follow-up Telephone Call Date of discharge and from where: 02/01/2022-[place]  How have you been since you were released from the hospital? Pt stated she is doing fine Any questions or concerns? No  Items Reviewed: Did the pt receive and understand the discharge instructions provided? Yes  Medications obtained and verified? Yes  Other? No  Any new allergies since your discharge? No  Dietary orders reviewed? No Do you have support at home? Yes   Home Care and Equipment/Supplies: Were home health services ordered? not applicable If so, what is the name of the agency? N/A  Has the agency set up a time to come to the patient's home? not applicable Were any new equipment or medical supplies ordered?  No What is the name of the medical supply agency? N/A Were you able to get the supplies/equipment? not applicable Do you have any questions related to the use of the equipment or supplies? No  Functional Questionnaire: (I = Independent and D = Dependent) ADLs: I  Bathing/Dressing- I  Meal Prep- I  Eating- I  Maintaining continence- I  Transferring/Ambulation- I  Managing Meds- I  Follow up appointments reviewed:  PCP Hospital f/u appt confirmed? Yes  Scheduled to see Dr. Posey Pronto on 02/19/2022. [facility] f/u appt confirmed? No   Are transportation arrangements needed? No  If their condition worsens, is the pt aware to call PCP or go to the Emergency Dept.? Yes Was the patient provided with contact information for the PCP's office or ED? Yes Was to pt encouraged to call back with questions or concerns? Yes

## 2022-02-13 ENCOUNTER — Emergency Department (HOSPITAL_COMMUNITY): Payer: Medicaid Other

## 2022-02-13 ENCOUNTER — Other Ambulatory Visit: Payer: Self-pay

## 2022-02-13 ENCOUNTER — Emergency Department (HOSPITAL_COMMUNITY)
Admission: EM | Admit: 2022-02-13 | Discharge: 2022-02-13 | Disposition: A | Discharge status: Home / Self Care | Payer: Medicaid Other | Attending: Emergency Medicine | Admitting: Emergency Medicine

## 2022-02-13 ENCOUNTER — Encounter (HOSPITAL_COMMUNITY): Payer: Self-pay | Admitting: *Deleted

## 2022-02-13 DIAGNOSIS — R519 Headache, unspecified: Secondary | ICD-10-CM | POA: Diagnosis not present

## 2022-02-13 DIAGNOSIS — Z91040 Latex allergy status: Secondary | ICD-10-CM | POA: Diagnosis not present

## 2022-02-13 DIAGNOSIS — M79604 Pain in right leg: Secondary | ICD-10-CM | POA: Insufficient documentation

## 2022-02-13 DIAGNOSIS — M5441 Lumbago with sciatica, right side: Secondary | ICD-10-CM | POA: Diagnosis not present

## 2022-02-13 DIAGNOSIS — Z7982 Long term (current) use of aspirin: Secondary | ICD-10-CM | POA: Insufficient documentation

## 2022-02-13 DIAGNOSIS — M545 Low back pain, unspecified: Secondary | ICD-10-CM | POA: Diagnosis not present

## 2022-02-13 DIAGNOSIS — M79661 Pain in right lower leg: Secondary | ICD-10-CM | POA: Diagnosis not present

## 2022-02-13 LAB — CBC WITH DIFFERENTIAL/PLATELET
Abs Immature Granulocytes: 0.03 10*3/uL (ref 0.00–0.07)
Basophils Absolute: 0.1 10*3/uL (ref 0.0–0.1)
Basophils Relative: 1 %
Eosinophils Absolute: 0.5 10*3/uL (ref 0.0–0.5)
Eosinophils Relative: 5 %
HCT: 40.4 % (ref 36.0–46.0)
Hemoglobin: 13 g/dL (ref 12.0–15.0)
Immature Granulocytes: 0 %
Lymphocytes Relative: 23 %
Lymphs Abs: 2.1 10*3/uL (ref 0.7–4.0)
MCH: 28 pg (ref 26.0–34.0)
MCHC: 32.2 g/dL (ref 30.0–36.0)
MCV: 87.1 fL (ref 80.0–100.0)
Monocytes Absolute: 0.5 10*3/uL (ref 0.1–1.0)
Monocytes Relative: 5 %
Neutro Abs: 6.2 10*3/uL (ref 1.7–7.7)
Neutrophils Relative %: 66 %
Platelets: 230 10*3/uL (ref 150–400)
RBC: 4.64 MIL/uL (ref 3.87–5.11)
RDW: 13.6 % (ref 11.5–15.5)
WBC: 9.3 10*3/uL (ref 4.0–10.5)
nRBC: 0 % (ref 0.0–0.2)

## 2022-02-13 LAB — BASIC METABOLIC PANEL
Anion gap: 6 (ref 5–15)
BUN: 16 mg/dL (ref 6–20)
CO2: 22 mmol/L (ref 22–32)
Calcium: 9.1 mg/dL (ref 8.9–10.3)
Chloride: 108 mmol/L (ref 98–111)
Creatinine, Ser: 0.72 mg/dL (ref 0.44–1.00)
GFR, Estimated: >60 mL/min (ref >60)
Glucose, Bld: 96 mg/dL (ref 70–99)
Potassium: 3.9 mmol/L (ref 3.5–5.1)
Sodium: 136 mmol/L (ref 135–145)

## 2022-02-13 LAB — HCG, SERUM, QUALITATIVE: Preg, Serum: NEGATIVE

## 2022-02-13 MED ORDER — ONDANSETRON HCL 4 MG/2ML IJ SOLN
4.0000 mg | Freq: Once | INTRAMUSCULAR | Status: AC
  Administered 2022-02-13: 4 mg via INTRAVENOUS
  Filled 2022-02-13: qty 2

## 2022-02-13 MED ORDER — HYDROMORPHONE HCL 1 MG/ML IJ SOLN
1.0000 mg | Freq: Once | INTRAMUSCULAR | Status: AC
  Administered 2022-02-13: 1 mg via INTRAVENOUS
  Filled 2022-02-13: qty 1

## 2022-02-13 MED ORDER — DEXAMETHASONE 4 MG PO TABS
10.0000 mg | ORAL_TABLET | Freq: Once | ORAL | Status: AC
Start: 2022-02-13 — End: 2022-02-13
  Administered 2022-02-13: 10 mg via ORAL
  Filled 2022-02-13: qty 3

## 2022-02-13 MED ORDER — OXYCODONE HCL 5 MG PO TABS: 5.0000 mg | ORAL_TABLET | Freq: Four times a day (QID) | ORAL | 0 refills | Status: DC | PRN

## 2022-02-13 NOTE — Discharge Instructions (Addendum)
Your case was discussed with the neurosurgical group.  They will try to arrange appointment for you this coming week.  Call their office in 1 or 2 days to verify.   Return immediately back to the ER if:  Your symptoms worsen within the next 12-24 hours. You develop new symptoms such as new fevers, persistent vomiting, new pain, shortness of breath, or new weakness or numbness, or if you have any other concerns.

## 2022-02-13 NOTE — ED Triage Notes (Addendum)
C/o HA, neck pain, and R hip pain. Mentions recent lumbar surgery, also subsequent CSF leak. Symptoms ongoing for a weak and worsening. Describes as continual and recurrent. Pt of Dr. Andee Poles, and colleague. Denies travel, fever, falls, or other sx.

## 2022-02-13 NOTE — ED Provider Notes (Signed)
[facility] EMERGENCY DEPARTMENT Provider Note   CSN: [id number] Arrival date & time: 02/13/22  1140     History  Chief Complaint  Patient presents with   Headache    Kaylee Miller is a 31 y.o. female.  Patient presents with chief complaint of intermittent headaches and right sided back pain rating down her right leg.  Symptoms been ongoing for the past week.  Is complicated with a history of many laminectomy performed in late May.  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  Patient has been doing well with intermittent back pain and intermittent right leg weakness, symptoms have worsened today and she presents to the ER.  Denies fevers or cough denies vomiting or diarrhea.       Home Medications Prior to Admission medications   Medication Sig Start Date End Date Taking? Authorizing Provider  albuterol (VENTOLIN HFA) 108 (90 Base) MCG/ACT inhaler Inhale 2 puffs into the lungs every 6 (six) hours as needed for wheezing or shortness of breath. 06/10/20  Yes Rolland Porter, MD  aspirin-acetaminophen-caffeine (EXCEDRIN MIGRAINE) [phone] MG tablet Take 2 tablets by mouth every 6 (six) hours as needed for migraine.   Yes [provider]  docusate sodium (COLACE) 100 MG capsule Take 200 mg by mouth daily.   Yes [provider]  fluticasone (FLONASE) 50 MCG/ACT nasal spray Place 2 sprays into both nostrils daily as needed for allergies or rhinitis.   Yes [provider]  ibuprofen (ADVIL) 200 MG tablet Take 800 mg by mouth every 6 (six) hours as needed for cramping.   Yes [provider]  meclizine (ANTIVERT) 25 MG tablet Take 1 tablet (25 mg total) by mouth 3 (three) times daily as needed for dizziness. 01/11/21  Yes Raspet, Derry Skill, PA-C  medroxyPROGESTERone Acetate 150 MG/ML SUSY Inject 1 mL (150 mg total) into the muscle every 3 (three) months. INJECT 1 ML INTO THE MUSCLE EVERY 3 MONTHS Strength: 150 mg/mL 08/21/21  Yes Derrek Monaco A, NP  Melatonin 5 MG CAPS Take 5 mg by mouth at bedtime as needed (sleep).   Yes [provider]  ondansetron (ZOFRAN ODT) 4 MG disintegrating tablet Take 1 tablet (4 mg total) by mouth every 8 (eight) hours as needed for nausea or vomiting. 12/19/21  Yes Derrek Monaco A, NP  SUMAtriptan (IMITREX) 50 MG tablet TAKE 1 TABLET BY MOUTH EVERY 2 HOURS AS NEEDED FOR MIGRAINE. MAY REPEAT IN 2 HOURS IF HEADACHE PERSISTS OR RECURS. MAXIMUM OF 2 DOSES. Patient taking differently: Take 50 mg by mouth every 2 (two) hours as needed for migraine. 01/27/22  Yes Lindell Spar, MD  tiZANidine (ZANAFLEX) 4 MG tablet Take 1 tablet (4 mg total) by mouth every 6 (six) hours as needed for muscle spasms. 01/16/22  Yes Ashok Pall, MD  Vitamin D, Ergocalciferol, (DRISDOL) 1.25 MG (50000 UNIT) CAPS capsule Take 1 capsule (50,000 Units total) by mouth every 7 (seven) days. 12/19/20  Yes Lindell Spar, MD  escitalopram (LEXAPRO) 10 MG tablet Take 1 tablet (10 mg total) by mouth daily. Patient not taking: Reported on 02/13/2022 01/21/22 01/21/23  Estill Dooms, NP  HYDROcodone-acetaminophen (NORCO/VICODIN) 5-325 MG tablet Take 1 tablet by mouth every 6 (six) hours as needed for severe pain. Patient not taking: Reported on 01/29/2022 01/07/22   Lindell Spar, MD  oxyCODONE (OXY IR/ROXICODONE) 5 MG immediate release tablet Take 5 mg by mouth 4 (four) times daily as needed. Patient not taking: Reported on  02/13/2022 01/16/22   [provider]      Allergies    Latex    Review of Systems   Review of Systems  Constitutional:  Negative for fever.  HENT:  Negative for ear pain.   Eyes:  Negative for pain.  Respiratory:  Negative for cough.   Cardiovascular:  Negative for chest pain.  Gastrointestinal:  Negative for abdominal pain.  Genitourinary:  Negative for flank pain.  Musculoskeletal:  Positive for back pain.  Skin:  Negative for rash.  Neurological:  Negative for headaches.     Physical Exam Updated Vital Signs BP 128/63 (BP Location: Right Arm)   Pulse 69   Temp 99 F (37.2 C) (Oral)   Resp 19   Ht '5\' 7"'$  (1.702 m)   Wt 89.8 kg   LMP 01/03/2022 (Exact Date)   SpO2 100%   BMI 31.01 kg/m  Physical Exam Constitutional:      General: She is not in acute distress.    Appearance: Normal appearance.  HENT:     Head: Normocephalic.     Nose: Nose normal.  Eyes:     Extraocular Movements: Extraocular movements intact.  Cardiovascular:     Rate and Rhythm: Normal rate.  Pulmonary:     Effort: Pulmonary effort is normal.  Musculoskeletal:        General: Normal range of motion.     Cervical back: Normal range of motion.  Neurological:     General: No focal deficit present.     Mental Status: She is alert. Mental status is at baseline.     Comments: 5/5 strength all extremities.  No focal neurodeficit noted.  No saddle anesthesia noted.  Right lower extremity left lower extremity dorsalis pedis pulses are 2+, feet are warm and well-perfused.     ED Results / Procedures / Treatments   Labs (all labs ordered are listed, but only abnormal results are displayed) Labs Reviewed  BASIC METABOLIC PANEL  CBC WITH DIFFERENTIAL/PLATELET  HCG, SERUM, QUALITATIVE    EKG None  Radiology MR LUMBAR SPINE WO CONTRAST  Result Date: 02/13/2022 CLINICAL DATA:  Low back pain. Recurrent right leg pain. History of back surgery approximately 1 month ago with subsequent CSF leak. EXAM: MRI LUMBAR SPINE WITHOUT CONTRAST TECHNIQUE: Multiplanar, multisequence MR imaging of the lumbar spine was performed. No intravenous contrast was administered. COMPARISON:  MRI lumbar spine 01/29/2022 FINDINGS: Segmentation:  5 lumbar vertebra Alignment:  Normal Vertebrae: Negative for fracture or mass. No evidence of spinal infection. Conus medullaris and cauda equina: Conus extends to the L1-2 level. Conus and cauda equina appear normal. Paraspinal and other soft tissues: Improvement  in fluid collection in the posterior soft tissues. This extends down to the thecal sac on the right at L5-S1 at site of laminectomy. Fluid collection extends into the subcutaneous tissues. Both components have improved in the interval. Findings compatible with CSF leak with improvement. Disc levels: L1-2: Negative L2-3: Negative L3-4: Normal disc space. Mild facet degeneration. Negative for stenosis L4-5: Normal disc space. Mild facet degeneration. Negative for stenosis. L5-S1: Right laminectomy. Apparent recurrent disc protrusion on the right which has developed since the prior study. Progressive mass-effect on the thecal sac and S1 nerve root compared with the prior study. CSF leak on the right has improved in the interval. IMPRESSION: Right laminectomy L5-S1. 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 Interval improvement in CSF leak arising at the  laminectomy defect L5-S1 on the right extending into the subcutaneous tissues. Electronically Signed   By: Franchot Gallo M.D.   On: 02/13/2022 17:50   US Venous Img Lower Right (DVT Study)  Result Date: 02/13/2022 CLINICAL DATA:  Right lower extremity pain. EXAM: RIGHT LOWER EXTREMITY VENOUS DOPPLER ULTRASOUND TECHNIQUE: Gray-scale sonography with graded compression, as well as color Doppler and duplex ultrasound were performed to evaluate the lower extremity deep venous systems from the level of the common femoral vein and including the common femoral, femoral, profunda femoral, popliteal and calf veins including the posterior tibial, peroneal and gastrocnemius veins when visible. The superficial great saphenous vein was also interrogated. Spectral Doppler was utilized to evaluate flow at rest and with distal augmentation maneuvers in the common femoral, femoral and popliteal veins. COMPARISON:  None Available. FINDINGS: Contralateral Common Femoral Vein: Respiratory phasicity is normal  and symmetric with the symptomatic side. No evidence of thrombus. Normal compressibility. Common Femoral Vein: No evidence of thrombus. Normal compressibility, respiratory phasicity and response to augmentation. Saphenofemoral Junction: No evidence of thrombus. Normal compressibility and flow on color Doppler imaging. Profunda Femoral Vein: No evidence of thrombus. Normal compressibility and flow on color Doppler imaging. Femoral Vein: No evidence of thrombus. Normal compressibility, respiratory phasicity and response to augmentation. Popliteal Vein: No evidence of thrombus. Normal compressibility, respiratory phasicity and response to augmentation. Calf Veins: No evidence of thrombus. Normal compressibility and flow on color Doppler imaging. Superficial Great Saphenous Vein: No evidence of thrombus. Normal compressibility. Venous Reflux:  None. Other Findings: No evidence of superficial thrombophlebitis or abnormal fluid collection. IMPRESSION: No evidence of right lower extremity deep venous thrombosis. Electronically Signed   By: Aletta Edouard M.D.   On: 02/13/2022 17:00   CT Head Wo Contrast  Result Date: 02/13/2022 CLINICAL DATA:  Headache history of lumbar spine surgery with CSF leak EXAM: CT HEAD WITHOUT CONTRAST TECHNIQUE: Contiguous axial images were obtained from the base of the skull through the vertex without intravenous contrast. RADIATION DOSE REDUCTION: This exam was performed according to the departmental dose-optimization program which includes automated exposure control, adjustment of the mA and/or kV according to patient size and/or use of iterative reconstruction technique. COMPARISON:  Brain MRI 01/29/2022 FINDINGS: Brain: There is no acute intracranial hemorrhage, extra-axial fluid collection, or acute infarct. Parenchymal volume is normal. The ventricles are normal in size. Gray-white differentiation is preserved. There is no mass lesion. There is no mass effect or midline shift. There  is no inferior cerebellar tonsillar descent. Vascular: No hyperdense vessel or unexpected calcification. Skull: Normal. Negative for fracture or focal lesion. Sinuses/Orbits: The imaged paranasal sinuses are clear. The globes and orbits are unremarkable. Other: None. IMPRESSION: Normal head CT.  No findings of intracranial hypotension. Electronically Signed   By: Valetta Mole M.D.   On: 02/13/2022 15:31    Procedures Procedures    Medications Ordered in ED Medications  dexamethasone (DECADRON) tablet 10 mg (has no administration in time range)  HYDROmorphone (DILAUDID) injection 1 mg (1 mg Intravenous Given 02/13/22 1706)  ondansetron (ZOFRAN) injection 4 mg (4 mg Intravenous Given 02/13/22 1706)    ED Course/ Medical Decision Making/ A&P                           Medical Decision Making Amount and/or Complexity of Data Reviewed Radiology: ordered.  Risk Prescription drug management.   History obtained from family at bedside.  Review of outside records shows surgical intervention  January 30, 2022 with revision laminectomy.  Patient has no headache at this time last headache was a few days ago she states.  Labs are sent white count normal chemistry normal.  Ultrasound shows no evidence of DVT CT head unremarkable.  MRI of the L-spine concerning for recurrent disc protrusion with impingement of the thecal sac and S1 nerve root.  Findings discussed with on-call neurosurgery Dr.Thomas, recommending continued outpatient follow-up.  Patient expressed understanding.  Discharged home in stable condition advised follow-up with neurosurgery clinic within the week.  Advising immediate return for fevers worsening symptoms or any additional concerns.          Final Clinical Impression(s) / ED Diagnoses Final diagnoses:  Acute right-sided low back pain with right-sided sciatica    Rx / DC Orders ED Discharge Orders     None         Luna Fuse, MD 02/13/22 1905

## 2022-02-14 ENCOUNTER — Telehealth: Payer: Self-pay

## 2022-02-17 ENCOUNTER — Emergency Department (HOSPITAL_COMMUNITY): Payer: Medicaid Other

## 2022-02-17 ENCOUNTER — Other Ambulatory Visit: Payer: Self-pay

## 2022-02-17 ENCOUNTER — Encounter (HOSPITAL_COMMUNITY): Payer: Self-pay | Admitting: Emergency Medicine

## 2022-02-17 ENCOUNTER — Emergency Department (HOSPITAL_COMMUNITY)
Admission: EM | Admit: 2022-02-17 | Discharge: 2022-02-17 | Disposition: A | Discharge status: Home / Self Care | Payer: Medicaid Other | Attending: Emergency Medicine | Admitting: Emergency Medicine

## 2022-02-17 DIAGNOSIS — R109 Unspecified abdominal pain: Secondary | ICD-10-CM | POA: Diagnosis not present

## 2022-02-17 DIAGNOSIS — K5900 Constipation, unspecified: Secondary | ICD-10-CM | POA: Insufficient documentation

## 2022-02-17 DIAGNOSIS — M5441 Lumbago with sciatica, right side: Secondary | ICD-10-CM | POA: Insufficient documentation

## 2022-02-17 DIAGNOSIS — Z91040 Latex allergy status: Secondary | ICD-10-CM | POA: Insufficient documentation

## 2022-02-17 DIAGNOSIS — R202 Paresthesia of skin: Secondary | ICD-10-CM | POA: Diagnosis present

## 2022-02-17 LAB — CBC WITH DIFFERENTIAL/PLATELET
Abs Immature Granulocytes: 0.03 10*3/uL (ref 0.00–0.07)
Basophils Absolute: 0.1 10*3/uL (ref 0.0–0.1)
Basophils Relative: 1 %
Eosinophils Absolute: 0.6 10*3/uL — ABNORMAL HIGH (ref 0.0–0.5)
Eosinophils Relative: 8 %
HCT: 41.1 % (ref 36.0–46.0)
Hemoglobin: 13.2 g/dL (ref 12.0–15.0)
Immature Granulocytes: 0 %
Lymphocytes Relative: 37 %
Lymphs Abs: 2.9 10*3/uL (ref 0.7–4.0)
MCH: 27.9 pg (ref 26.0–34.0)
MCHC: 32.1 g/dL (ref 30.0–36.0)
MCV: 86.9 fL (ref 80.0–100.0)
Monocytes Absolute: 0.4 10*3/uL (ref 0.1–1.0)
Monocytes Relative: 5 %
Neutro Abs: 3.8 10*3/uL (ref 1.7–7.7)
Neutrophils Relative %: 49 %
Platelets: 224 10*3/uL (ref 150–400)
RBC: 4.73 MIL/uL (ref 3.87–5.11)
RDW: 13.3 % (ref 11.5–15.5)
WBC: 7.8 10*3/uL (ref 4.0–10.5)
nRBC: 0 % (ref 0.0–0.2)

## 2022-02-17 LAB — BASIC METABOLIC PANEL
Anion gap: 7 (ref 5–15)
BUN: 17 mg/dL (ref 6–20)
CO2: 26 mmol/L (ref 22–32)
Calcium: 9.1 mg/dL (ref 8.9–10.3)
Chloride: 105 mmol/L (ref 98–111)
Creatinine, Ser: 0.81 mg/dL (ref 0.44–1.00)
GFR, Estimated: >60 mL/min (ref >60)
Glucose, Bld: 96 mg/dL (ref 70–99)
Potassium: 4 mmol/L (ref 3.5–5.1)
Sodium: 138 mmol/L (ref 135–145)

## 2022-02-17 LAB — URINALYSIS, ROUTINE W REFLEX MICROSCOPIC
Bilirubin Urine: NEGATIVE
Glucose, UA: NEGATIVE mg/dL
Hgb urine dipstick: NEGATIVE
Ketones, ur: NEGATIVE mg/dL
Leukocytes,Ua: NEGATIVE
Nitrite: NEGATIVE
Protein, ur: NEGATIVE mg/dL
Specific Gravity, Urine: 1.023 (ref 1.005–1.030)
pH: 5 (ref 5.0–8.0)

## 2022-02-17 LAB — POC URINE PREG, ED: Preg Test, Ur: NEGATIVE

## 2022-02-17 MED ORDER — HYDROMORPHONE HCL 1 MG/ML IJ SOLN
1.0000 mg | Freq: Once | INTRAMUSCULAR | Status: AC
  Administered 2022-02-17: 1 mg via INTRAVENOUS
  Filled 2022-02-17: qty 1

## 2022-02-17 MED ORDER — GABAPENTIN 100 MG PO CAPS
100.0000 mg | ORAL_CAPSULE | Freq: Three times a day (TID) | ORAL | 0 refills | Status: DC
Start: 2022-02-17 — End: 2022-06-03

## 2022-02-17 MED ORDER — ONDANSETRON HCL 4 MG/2ML IJ SOLN
4.0000 mg | Freq: Once | INTRAMUSCULAR | Status: AC
  Administered 2022-02-17: 4 mg via INTRAVENOUS
  Filled 2022-02-17: qty 2

## 2022-02-17 MED ORDER — METHYLPREDNISOLONE 4 MG PO TBPK: ORAL_TABLET | ORAL | 0 refills | Status: DC

## 2022-02-17 NOTE — ED Triage Notes (Signed)
Pt presents with right leg pain, history of herniated disc, seen in this ED for same on Wed was told to come back if pain was worse, unable to get in touch with Neurologist that follows her.

## 2022-02-17 NOTE — ED Provider Notes (Signed)
[facility] Provider Note   CSN: [id number] Arrival date & time: 02/17/22  1418     History  Chief Complaint  Patient presents with   Leg Pain    [place] is a 31 y.o. female.   Leg Pain Associated symptoms: back pain   Associated symptoms: no fever        Kaylee Miller is a 31 y.o. 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  she presents to the Emergency Department complaining of persistent pain and paresthesias of her right leg.  She was seen in this emergency department 4 days ago for same.  Her work-up at that time included MRI of spine, CT of the brain and venous imaging of the right lower extremity.  She describes having pain of her right leg that is sharp and burning in quality and radiates to the level of her foot.  She describes a tingling sensation to her foot and toes.  Her pain is worsened with sitting.  Pain today is similar to previous.  She denies any worsening symptoms or change in the level of her pain.  States that she has been constipated and has not had a bowel movement in approximately 2 weeks.  Pain of her leg worsens if she attempts to bear down to defecate.  She takes stool softeners daily, no laxatives.  She denies any fever, chills, she states that she has tried to contact her neurosurgery office, but has not been able to see anyone for follow-up  Home Medications Prior to Admission medications   Medication Sig Start Date End Date Taking? Authorizing Provider  albuterol (VENTOLIN HFA) 108 (90 Base) MCG/ACT inhaler Inhale 2 puffs into the lungs every 6 (six) hours as needed for wheezing or shortness of breath. 06/10/20   Rolland Porter, MD  aspirin-acetaminophen-caffeine (EXCEDRIN MIGRAINE) [phone] MG tablet Take 2 tablets by mouth every 6 (six) hours as needed for migraine.    [provider]   docusate sodium (COLACE) 100 MG capsule Take 200 mg by mouth daily.    [provider]  escitalopram (LEXAPRO) 10 MG tablet Take 1 tablet (10 mg total) by mouth daily. Patient not taking: Reported on 02/13/2022 01/21/22 01/21/23  Estill Dooms, NP  fluticasone [facility]) 50 MCG/ACT nasal spray Place 2 sprays into both nostrils daily as needed for allergies or rhinitis.    [provider]  ibuprofen (ADVIL) 200 MG tablet Take 800 mg by mouth every 6 (six) hours as needed for cramping.    [provider]  meclizine (ANTIVERT) 25 MG tablet Take 1 tablet (25 mg total) by mouth 3 (three) times daily as needed for dizziness. 01/11/21   Raspet, Derry Skill, PA-C  medroxyPROGESTERone Acetate 150 MG/ML SUSY Inject 1 mL (150 mg total) into the muscle every 3 (three) months. INJECT 1 ML INTO THE MUSCLE EVERY 3 MONTHS Strength: 150 mg/mL 08/21/21   Derrek Monaco A, NP  Melatonin 5 MG CAPS Take 5 mg by mouth at bedtime as needed (sleep).    [provider]  ondansetron (ZOFRAN ODT) 4 MG disintegrating tablet Take 1 tablet (4 mg total) by mouth every 8 (eight) hours as needed for nausea or vomiting. 12/19/21   Estill Dooms, NP  oxyCODONE (OXY IR/ROXICODONE) 5 MG immediate release tablet Take 1 tablet (5 mg total) by mouth every 6 (six) hours as needed for up  to 10 doses. 02/13/22   Luna Fuse, MD  SUMAtriptan (IMITREX) 50 MG tablet TAKE 1 TABLET BY MOUTH EVERY 2 HOURS AS NEEDED FOR MIGRAINE. MAY REPEAT IN 2 HOURS IF HEADACHE PERSISTS OR RECURS. MAXIMUM OF 2 DOSES. Patient taking differently: Take 50 mg by mouth every 2 (two) hours as needed for migraine. 01/27/22   Lindell Spar, MD  tiZANidine (ZANAFLEX) 4 MG tablet Take 1 tablet (4 mg total) by mouth every 6 (six) hours as needed for muscle spasms. 01/16/22   Ashok Pall, MD  Vitamin D, Ergocalciferol, (DRISDOL) 1.25 MG (50000 UNIT) CAPS capsule Take 1 capsule (50,000 Units total) by mouth every 7 (seven) days.  12/19/20   Lindell Spar, MD      Allergies    Latex    Review of Systems   Review of Systems  Constitutional:  Negative for appetite change, chills and fever.  Respiratory:  Negative for cough, chest tightness and shortness of breath.   Cardiovascular:  Negative for chest pain and leg swelling.  Gastrointestinal:  Negative for abdominal pain, nausea and vomiting.  Musculoskeletal:  Positive for back pain and myalgias (Right leg pain).  Skin:  Negative for color change and rash.  Neurological:  Positive for numbness (Tingling sensation of the right foot and toes). Negative for syncope and weakness.    Physical Exam Updated Vital Signs BP 116/63 (BP Location: Left Arm)   Pulse 96   Temp 98.6 F (37 C) (Oral)   Resp 20   Ht '5\' 7"'$  (1.702 m)   Wt 89.8 kg   LMP 01/03/2022 (Exact Date)   SpO2 100%   BMI 31.01 kg/m  Physical Exam Vitals and nursing note reviewed.  Constitutional:      General: She is not in acute distress.    Appearance: Normal appearance.  HENT:     Mouth/Throat:     Mouth: Mucous membranes are moist.  Cardiovascular:     Rate and Rhythm: Normal rate and regular rhythm.     Pulses: Normal pulses.  Pulmonary:     Effort: Pulmonary effort is normal.     Breath sounds: Normal breath sounds.  Abdominal:     Palpations: Abdomen is soft.     Tenderness: There is no abdominal tenderness.  Musculoskeletal:        General: No swelling.     Comments: Some tenderness to the lower midline lumbar spine and right paraspinal muscles.  No edema or erythema.  Incision appears well-healed.  Skin:    General: Skin is warm.     Capillary Refill: Capillary refill takes less than 2 seconds.     Findings: No erythema.  Neurological:     General: No focal deficit present.     Mental Status: She is alert.     Sensory: Sensation is intact. No sensory deficit.     Motor: Motor function is intact. No weakness.     Comments: 5 out of 5 strength bilateral lower extremities.   No foot drop noted.     ED Results / Procedures / Treatments   Labs (all labs ordered are listed, but only abnormal results are displayed) Labs Reviewed  CBC WITH DIFFERENTIAL/PLATELET - Abnormal; Notable for the following components:      Result Value   Eosinophils Absolute 0.6 (*)    All other components within normal limits  URINALYSIS, ROUTINE W REFLEX MICROSCOPIC  BASIC METABOLIC PANEL  POC URINE PREG, ED    EKG None  Radiology  DG Abdomen 1 View  Result Date: 02/17/2022 CLINICAL DATA:  A 31 year old female presents for evaluation of constipation and abdominal pain post lumbar laminectomy. EXAM: ABDOMEN - 1 VIEW COMPARISON:  None available FINDINGS: Moderate stool in the ascending colon. No signs of colonic or small bowel distension. Rectal gas. No abnormal calcifications over the abdomen. Soft tissues are unremarkable to the extent evaluated. No acute bone finding on limited assessment. IMPRESSION: No signs of bowel obstruction or ileus. Moderate stool in the ascending colon. Electronically Signed   By: Zetta Bills M.D.   On: 02/17/2022 18:46    Procedures Procedures    Medications Ordered in ED Medications  HYDROmorphone (DILAUDID) injection 1 mg (1 mg Intravenous Given 02/17/22 1757)  ondansetron (ZOFRAN) injection 4 mg (4 mg Intravenous Given 02/17/22 1826)    ED Course/ Medical Decision Making/ A&P                           Medical Decision Making Patient here with persistent low back pain and right leg pain, she describes a sharp pain that radiates into her right buttock and down her her leg to the level of her foot.  She has some paresthesias of the right foot.  Pain associated with attempting to walk or stand.  Has difficulty sitting down as this makes the pain worse.  Today, she states that she has been constipated for approximately 2 weeks.  Take stool softener daily but has not tried laxative On exam, patient is well-appearing nontoxic.  Vital signs are  reassuring.  Dorsalis pedis pulses are palpable bilaterally both lower extremities or warm to the touch without erythema or edema.  No foot drop present.  Amount and/or Complexity of Data Reviewed Labs: ordered.    Details: Labs today show no evidence of leukocytosis.  Chemistries are unremarkable.  Urine pregnancy test is negative. Radiology: ordered.    Details: MRI L-spine from 02/13/2022 shows Right laminectomy L5-S1. 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 Interval improvement in CSF leak arising at the laminectomy defect L5-S1 on the right extending into the subcutaneous tissues.  CT head from 02/13/2022 negative for acute intracranial injury, patient also had venous imaging of the right lower extremity at that time that was also negative for presence of DVT. Discussion of management or test interpretation with external provider(s): Patient here with persistent paresthesias of the right leg.  Multiple prior laminectomies with repair of CSF leak 01/30/2022.  No new or worsening symptoms.  States she has been unable to see her neurosurgery.  She reports to me that she has been constipated x2 weeks states that she has worsening pain if she attempts to bear down to defecate.  Recent MRI of the L-spine without evidence of cauda equina.  He had reassuring venous imaging of the extremity as well as CT of the head.  Doubt emergent process.  Discussed findings with NP [facility] with [place] neurosurgery.  She recommends starting gabapentin 100 mg 3 times daily and Medrol Dosepak they will arrange for close follow-up in the office.  I discussed this with patient and she is agreeable to plan.  We will also recommend MiraLAX twice daily until relief of constipation.  Risk Prescription drug management.           Final Clinical Impression(s) / ED Diagnoses Final diagnoses:  Acute right-sided low back pain with  right-sided sciatica  Constipation,  unspecified constipation type    Rx / DC Orders ED Discharge Orders     None         Kem Parkinson, Hershal Coria 02/17/22 1952    Noemi Chapel, MD 02/19/22 1115

## 2022-02-18 ENCOUNTER — Telehealth: Payer: Self-pay

## 2022-02-18 ENCOUNTER — Encounter (HOSPITAL_COMMUNITY): Payer: Self-pay | Admitting: Neurosurgery

## 2022-02-18 ENCOUNTER — Other Ambulatory Visit: Payer: Self-pay | Admitting: Neurosurgery

## 2022-02-19 ENCOUNTER — Other Ambulatory Visit: Payer: Self-pay

## 2022-02-19 ENCOUNTER — Observation Stay (HOSPITAL_COMMUNITY)
Admission: RE | Admit: 2022-02-19 | Discharge: 2022-02-20 | Disposition: A | Discharge status: Home / Self Care | Payer: Medicaid Other | Attending: Neurosurgery | Admitting: Neurosurgery

## 2022-02-19 ENCOUNTER — Ambulatory Visit (HOSPITAL_COMMUNITY): Payer: Medicaid Other | Admitting: Certified Registered"

## 2022-02-19 ENCOUNTER — Ambulatory Visit: Payer: Medicaid Other | Admitting: Internal Medicine

## 2022-02-19 ENCOUNTER — Encounter (HOSPITAL_COMMUNITY)
Admission: RE | Disposition: A | Discharge status: Home / Self Care | Payer: Self-pay | Source: Home / Self Care | Attending: Neurosurgery

## 2022-02-19 ENCOUNTER — Ambulatory Visit (HOSPITAL_COMMUNITY): Payer: Medicaid Other

## 2022-02-19 ENCOUNTER — Encounter (HOSPITAL_COMMUNITY): Payer: Self-pay | Admitting: Neurosurgery

## 2022-02-19 ENCOUNTER — Ambulatory Visit (HOSPITAL_BASED_OUTPATIENT_CLINIC_OR_DEPARTMENT_OTHER): Payer: Medicaid Other | Admitting: Certified Registered"

## 2022-02-19 DIAGNOSIS — F32A Depression, unspecified: Secondary | ICD-10-CM | POA: Insufficient documentation

## 2022-02-19 DIAGNOSIS — F17210 Nicotine dependence, cigarettes, uncomplicated: Secondary | ICD-10-CM | POA: Insufficient documentation

## 2022-02-19 DIAGNOSIS — Z79899 Other long term (current) drug therapy: Secondary | ICD-10-CM | POA: Diagnosis not present

## 2022-02-19 DIAGNOSIS — M5127 Other intervertebral disc displacement, lumbosacral region: Principal | ICD-10-CM | POA: Insufficient documentation

## 2022-02-19 DIAGNOSIS — F419 Anxiety disorder, unspecified: Secondary | ICD-10-CM | POA: Insufficient documentation

## 2022-02-19 DIAGNOSIS — R519 Headache, unspecified: Secondary | ICD-10-CM | POA: Insufficient documentation

## 2022-02-19 DIAGNOSIS — Z8616 Personal history of COVID-19: Secondary | ICD-10-CM | POA: Diagnosis not present

## 2022-02-19 DIAGNOSIS — Z91040 Latex allergy status: Secondary | ICD-10-CM | POA: Insufficient documentation

## 2022-02-19 DIAGNOSIS — D649 Anemia, unspecified: Secondary | ICD-10-CM | POA: Insufficient documentation

## 2022-02-19 DIAGNOSIS — I10 Essential (primary) hypertension: Secondary | ICD-10-CM | POA: Insufficient documentation

## 2022-02-19 DIAGNOSIS — Z9889 Other specified postprocedural states: Secondary | ICD-10-CM | POA: Diagnosis not present

## 2022-02-19 DIAGNOSIS — K219 Gastro-esophageal reflux disease without esophagitis: Secondary | ICD-10-CM | POA: Diagnosis not present

## 2022-02-19 DIAGNOSIS — Z7982 Long term (current) use of aspirin: Secondary | ICD-10-CM | POA: Insufficient documentation

## 2022-02-19 DIAGNOSIS — M5126 Other intervertebral disc displacement, lumbar region: Secondary | ICD-10-CM | POA: Diagnosis not present

## 2022-02-19 DIAGNOSIS — J45909 Unspecified asthma, uncomplicated: Secondary | ICD-10-CM | POA: Insufficient documentation

## 2022-02-19 DIAGNOSIS — M5117 Intervertebral disc disorders with radiculopathy, lumbosacral region: Secondary | ICD-10-CM | POA: Diagnosis not present

## 2022-02-19 HISTORY — PX: LUMBAR LAMINECTOMY/DECOMPRESSION MICRODISCECTOMY: SHX5026

## 2022-02-19 LAB — CBC
HCT: 38.6 % (ref 36.0–46.0)
Hemoglobin: 12.7 g/dL (ref 12.0–15.0)
MCH: 28.6 pg (ref 26.0–34.0)
MCHC: 32.9 g/dL (ref 30.0–36.0)
MCV: 86.9 fL (ref 80.0–100.0)
Platelets: 219 10*3/uL (ref 150–400)
RBC: 4.44 MIL/uL (ref 3.87–5.11)
RDW: 13.2 % (ref 11.5–15.5)
WBC: 8.6 10*3/uL (ref 4.0–10.5)
nRBC: 0 % (ref 0.0–0.2)

## 2022-02-19 LAB — CREATININE, SERUM
Creatinine, Ser: 1.11 mg/dL — ABNORMAL HIGH (ref 0.44–1.00)
GFR, Estimated: >60 mL/min (ref >60)

## 2022-02-19 LAB — SURGICAL PCR SCREEN
MRSA, PCR: NEGATIVE
Staphylococcus aureus: NEGATIVE

## 2022-02-19 LAB — POCT PREGNANCY, URINE: Preg Test, Ur: NEGATIVE

## 2022-02-19 SURGERY — LUMBAR LAMINECTOMY/DECOMPRESSION MICRODISCECTOMY 1 LEVEL
Anesthesia: General | Site: Back

## 2022-02-19 MED ORDER — MIDAZOLAM HCL 2 MG/2ML IJ SOLN
INTRAMUSCULAR | Status: AC
  Filled 2022-02-19: qty 2

## 2022-02-19 MED ORDER — FENTANYL CITRATE (PF) 100 MCG/2ML IJ SOLN
25.0000 ug | INTRAMUSCULAR | Status: DC | PRN
  Administered 2022-02-19 (×2): 25 ug via INTRAVENOUS

## 2022-02-19 MED ORDER — FENTANYL CITRATE (PF) 250 MCG/5ML IJ SOLN
INTRAMUSCULAR | Status: DC | PRN
  Administered 2022-02-19: 100 ug via INTRAVENOUS
  Administered 2022-02-19: 50 ug via INTRAVENOUS
  Administered 2022-02-19: 100 ug via INTRAVENOUS

## 2022-02-19 MED ORDER — FENTANYL CITRATE (PF) 100 MCG/2ML IJ SOLN
INTRAMUSCULAR | Status: AC
  Filled 2022-02-19: qty 2

## 2022-02-19 MED ORDER — CELECOXIB 200 MG PO CAPS
200.0000 mg | ORAL_CAPSULE | Freq: Once | ORAL | Status: AC
Start: 2022-02-19 — End: 2022-02-19
  Administered 2022-02-19: 200 mg via ORAL
  Filled 2022-02-19: qty 1

## 2022-02-19 MED ORDER — ONDANSETRON HCL 4 MG/2ML IJ SOLN: 4.0000 mg | Freq: Once | INTRAMUSCULAR | Status: DC | PRN

## 2022-02-19 MED ORDER — THROMBIN 5000 UNITS EX SOLR
CUTANEOUS | Status: AC
  Filled 2022-02-19: qty 5000

## 2022-02-19 MED ORDER — HYDROMORPHONE HCL 1 MG/ML IJ SOLN
INTRAMUSCULAR | Status: DC | PRN
  Administered 2022-02-19 (×2): .5 mg via INTRAVENOUS

## 2022-02-19 MED ORDER — PROPOFOL 10 MG/ML IV BOLUS
INTRAVENOUS | Status: DC | PRN
  Administered 2022-02-19: 200 mg via INTRAVENOUS

## 2022-02-19 MED ORDER — ASPIRIN-ACETAMINOPHEN-CAFFEINE 250-250-65 MG PO TABS: 2.0000 | ORAL_TABLET | Freq: Four times a day (QID) | ORAL | Status: DC | PRN

## 2022-02-19 MED ORDER — FENTANYL CITRATE (PF) 250 MCG/5ML IJ SOLN
INTRAMUSCULAR | Status: AC
  Filled 2022-02-19: qty 5

## 2022-02-19 MED ORDER — OXYCODONE HCL 5 MG PO TABS: 5.0000 mg | ORAL_TABLET | Freq: Once | ORAL | Status: DC | PRN

## 2022-02-19 MED ORDER — SUGAMMADEX SODIUM 200 MG/2ML IV SOLN
INTRAVENOUS | Status: DC | PRN
  Administered 2022-02-19: 200 mg via INTRAVENOUS

## 2022-02-19 MED ORDER — OXYCODONE HCL 5 MG/5ML PO SOLN: 5.0000 mg | Freq: Once | ORAL | Status: DC | PRN

## 2022-02-19 MED ORDER — PHENYLEPHRINE 80 MCG/ML (10ML) SYRINGE FOR IV PUSH (FOR BLOOD PRESSURE SUPPORT)
PREFILLED_SYRINGE | INTRAVENOUS | Status: DC | PRN
  Administered 2022-02-19: 160 ug via INTRAVENOUS

## 2022-02-19 MED ORDER — LACTATED RINGERS IV SOLN: INTRAVENOUS | Status: DC

## 2022-02-19 MED ORDER — CHLORHEXIDINE GLUCONATE 0.12 % MT SOLN
15.0000 mL | Freq: Once | OROMUCOSAL | Status: AC
Start: 2022-02-19 — End: 2022-02-19

## 2022-02-19 MED ORDER — MORPHINE SULFATE (PF) 2 MG/ML IV SOLN: 2.0000 mg | INTRAVENOUS | Status: DC | PRN

## 2022-02-19 MED ORDER — SODIUM CHLORIDE 0.9% FLUSH
3.0000 mL | Freq: Two times a day (BID) | INTRAVENOUS | Status: DC
Start: 2022-02-19 — End: 2022-02-20

## 2022-02-19 MED ORDER — METHYLPREDNISOLONE ACETATE 80 MG/ML IJ SUSP
INTRAMUSCULAR | Status: DC | PRN
  Administered 2022-02-19: 80 mg

## 2022-02-19 MED ORDER — ALBUTEROL SULFATE (2.5 MG/3ML) 0.083% IN NEBU
2.5000 mg | INHALATION_SOLUTION | Freq: Four times a day (QID) | RESPIRATORY_TRACT | Status: DC | PRN
Start: 2022-02-19 — End: 2022-02-20

## 2022-02-19 MED ORDER — PHENYLEPHRINE 80 MCG/ML (10ML) SYRINGE FOR IV PUSH (FOR BLOOD PRESSURE SUPPORT)
PREFILLED_SYRINGE | INTRAVENOUS | Status: AC
  Filled 2022-02-19: qty 10

## 2022-02-19 MED ORDER — DEXAMETHASONE SODIUM PHOSPHATE 10 MG/ML IJ SOLN
INTRAMUSCULAR | Status: DC | PRN
  Administered 2022-02-19: 10 mg via INTRAVENOUS

## 2022-02-19 MED ORDER — ROCURONIUM BROMIDE 10 MG/ML (PF) SYRINGE
PREFILLED_SYRINGE | INTRAVENOUS | Status: AC
  Filled 2022-02-19: qty 10

## 2022-02-19 MED ORDER — SODIUM CHLORIDE 0.9% FLUSH: 3.0000 mL | INTRAVENOUS | Status: DC | PRN

## 2022-02-19 MED ORDER — ONDANSETRON HCL 4 MG PO TABS: 4.0000 mg | ORAL_TABLET | Freq: Four times a day (QID) | ORAL | Status: DC | PRN

## 2022-02-19 MED ORDER — FLUTICASONE PROPIONATE 50 MCG/ACT NA SUSP
2.0000 | Freq: Every day | NASAL | Status: DC | PRN
Start: 2022-02-19 — End: 2022-02-20

## 2022-02-19 MED ORDER — PROPOFOL 10 MG/ML IV BOLUS
INTRAVENOUS | Status: AC
  Filled 2022-02-19: qty 20

## 2022-02-19 MED ORDER — KETOROLAC TROMETHAMINE 15 MG/ML IJ SOLN
15.0000 mg | Freq: Four times a day (QID) | INTRAMUSCULAR | Status: AC
  Administered 2022-02-19 – 2022-02-20 (×4): 15 mg via INTRAVENOUS
  Filled 2022-02-19 (×4): qty 1

## 2022-02-19 MED ORDER — ONDANSETRON HCL 4 MG/2ML IJ SOLN: 4.0000 mg | Freq: Four times a day (QID) | INTRAMUSCULAR | Status: DC | PRN

## 2022-02-19 MED ORDER — OXYCODONE HCL 5 MG PO TABS
5.0000 mg | ORAL_TABLET | ORAL | Status: DC | PRN
  Administered 2022-02-20: 5 mg via ORAL

## 2022-02-19 MED ORDER — TIZANIDINE HCL 4 MG PO TABS
4.0000 mg | ORAL_TABLET | Freq: Four times a day (QID) | ORAL | Status: DC | PRN
  Administered 2022-02-19 – 2022-02-20 (×3): 4 mg via ORAL
  Filled 2022-02-19 (×3): qty 1

## 2022-02-19 MED ORDER — FENTANYL CITRATE (PF) 100 MCG/2ML IJ SOLN
INTRAMUSCULAR | Status: DC | PRN
  Administered 2022-02-19: 100 ug via INTRAVENOUS

## 2022-02-19 MED ORDER — DOCUSATE SODIUM 100 MG PO CAPS
200.0000 mg | ORAL_CAPSULE | Freq: Every day | ORAL | Status: DC
  Administered 2022-02-19 – 2022-02-20 (×2): 200 mg via ORAL
  Filled 2022-02-19 (×2): qty 2

## 2022-02-19 MED ORDER — SENNA 8.6 MG PO TABS
1.0000 | ORAL_TABLET | Freq: Two times a day (BID) | ORAL | Status: DC
Start: 2022-02-19 — End: 2022-02-20
  Administered 2022-02-19 – 2022-02-20 (×2): 8.6 mg via ORAL
  Filled 2022-02-19 (×2): qty 1

## 2022-02-19 MED ORDER — SCOPOLAMINE 1 MG/3DAYS TD PT72
1.0000 | MEDICATED_PATCH | TRANSDERMAL | Status: DC
Start: 2022-02-19 — End: 2022-02-19
  Administered 2022-02-19: 1.5 mg via TRANSDERMAL
  Filled 2022-02-19: qty 1

## 2022-02-19 MED ORDER — 0.9 % SODIUM CHLORIDE (POUR BTL) OPTIME
TOPICAL | Status: DC | PRN
  Administered 2022-02-19: 1000 mL

## 2022-02-19 MED ORDER — GABAPENTIN 100 MG PO CAPS
100.0000 mg | ORAL_CAPSULE | Freq: Three times a day (TID) | ORAL | Status: DC
  Administered 2022-02-19 – 2022-02-20 (×2): 100 mg via ORAL
  Filled 2022-02-19 (×2): qty 1

## 2022-02-19 MED ORDER — SUMATRIPTAN SUCCINATE 50 MG PO TABS: 50.0000 mg | ORAL_TABLET | ORAL | Status: DC | PRN

## 2022-02-19 MED ORDER — ACETAMINOPHEN 500 MG PO TABS
1000.0000 mg | ORAL_TABLET | Freq: Once | ORAL | Status: AC
Start: 2022-02-19 — End: 2022-02-19
  Administered 2022-02-19: 1000 mg via ORAL
  Filled 2022-02-19: qty 2

## 2022-02-19 MED ORDER — ONDANSETRON HCL 4 MG/2ML IJ SOLN
INTRAMUSCULAR | Status: AC
  Filled 2022-02-19: qty 2

## 2022-02-19 MED ORDER — DEXAMETHASONE SODIUM PHOSPHATE 10 MG/ML IJ SOLN
INTRAMUSCULAR | Status: AC
  Filled 2022-02-19: qty 1

## 2022-02-19 MED ORDER — CEFAZOLIN SODIUM-DEXTROSE 2-3 GM-%(50ML) IV SOLR
INTRAVENOUS | Status: DC | PRN
  Administered 2022-02-19: 2 g via INTRAVENOUS

## 2022-02-19 MED ORDER — SODIUM CHLORIDE 0.9 % IV SOLN
250.0000 mL | INTRAVENOUS | Status: DC
Start: 2022-02-19 — End: 2022-02-20

## 2022-02-19 MED ORDER — FENTANYL CITRATE (PF) 100 MCG/2ML IJ SOLN
INTRAMUSCULAR | Status: AC
  Administered 2022-02-19: 25 ug via INTRAVENOUS
  Filled 2022-02-19: qty 2

## 2022-02-19 MED ORDER — HEPARIN SODIUM (PORCINE) 5000 UNIT/ML IJ SOLN
5000.0000 [IU] | Freq: Three times a day (TID) | INTRAMUSCULAR | Status: DC
Start: 2022-02-19 — End: 2022-02-20
  Administered 2022-02-20: 5000 [IU] via SUBCUTANEOUS
  Filled 2022-02-19: qty 1

## 2022-02-19 MED ORDER — CHLORHEXIDINE GLUCONATE 0.12 % MT SOLN
OROMUCOSAL | Status: AC
  Administered 2022-02-19: 15 mL via OROMUCOSAL
  Filled 2022-02-19: qty 15

## 2022-02-19 MED ORDER — MELATONIN 5 MG PO TABS: 5.0000 mg | ORAL_TABLET | Freq: Every evening | ORAL | Status: DC | PRN

## 2022-02-19 MED ORDER — HEMOSTATIC AGENTS (NO CHARGE) OPTIME
TOPICAL | Status: DC | PRN
  Administered 2022-02-19: 1 via TOPICAL

## 2022-02-19 MED ORDER — OXYCODONE HCL 5 MG PO TABS
10.0000 mg | ORAL_TABLET | ORAL | Status: DC | PRN
  Administered 2022-02-19 – 2022-02-20 (×3): 10 mg via ORAL
  Filled 2022-02-19 (×4): qty 2

## 2022-02-19 MED ORDER — THROMBIN 5000 UNITS EX SOLR
CUTANEOUS | Status: AC
  Filled 2022-02-19: qty 10000

## 2022-02-19 MED ORDER — PHENOL 1.4 % MT LIQD: 1.0000 | OROMUCOSAL | Status: DC | PRN

## 2022-02-19 MED ORDER — LIDOCAINE 2% (20 MG/ML) 5 ML SYRINGE
INTRAMUSCULAR | Status: DC | PRN
  Administered 2022-02-19: 60 mg via INTRAVENOUS

## 2022-02-19 MED ORDER — SENNOSIDES-DOCUSATE SODIUM 8.6-50 MG PO TABS: 1.0000 | ORAL_TABLET | Freq: Every evening | ORAL | Status: DC | PRN

## 2022-02-19 MED ORDER — POTASSIUM CHLORIDE IN NACL 20-0.9 MEQ/L-% IV SOLN
INTRAVENOUS | Status: DC
Start: 2022-02-19 — End: 2022-02-20

## 2022-02-19 MED ORDER — HYDROMORPHONE HCL 1 MG/ML IJ SOLN
INTRAMUSCULAR | Status: AC
  Filled 2022-02-19: qty 0.5

## 2022-02-19 MED ORDER — CEFAZOLIN SODIUM-DEXTROSE 2-4 GM/100ML-% IV SOLN
INTRAVENOUS | Status: AC
  Filled 2022-02-19: qty 100

## 2022-02-19 MED ORDER — ORAL CARE MOUTH RINSE: 15.0000 mL | Freq: Once | OROMUCOSAL | Status: AC

## 2022-02-19 MED ORDER — MENTHOL 3 MG MT LOZG
1.0000 | LOZENGE | OROMUCOSAL | Status: DC | PRN
Start: 2022-02-19 — End: 2022-02-20

## 2022-02-19 MED ORDER — METHYLPREDNISOLONE ACETATE 80 MG/ML IJ SUSP
INTRAMUSCULAR | Status: AC
  Filled 2022-02-19: qty 1

## 2022-02-19 MED ORDER — ACETAMINOPHEN 650 MG RE SUPP: 650.0000 mg | RECTAL | Status: DC | PRN

## 2022-02-19 MED ORDER — ACETAMINOPHEN 325 MG PO TABS
650.0000 mg | ORAL_TABLET | ORAL | Status: DC | PRN
  Administered 2022-02-20: 650 mg via ORAL
  Filled 2022-02-19: qty 2

## 2022-02-19 MED ORDER — ROCURONIUM BROMIDE 10 MG/ML (PF) SYRINGE
PREFILLED_SYRINGE | INTRAVENOUS | Status: DC | PRN
  Administered 2022-02-19: 60 mg via INTRAVENOUS
  Administered 2022-02-19: 40 mg via INTRAVENOUS

## 2022-02-19 MED ORDER — THROMBIN 5000 UNITS EX SOLR
CUTANEOUS | Status: DC | PRN
  Administered 2022-02-19 (×2): 5000 [IU] via TOPICAL

## 2022-02-19 MED ORDER — ONDANSETRON HCL 4 MG/2ML IJ SOLN
INTRAMUSCULAR | Status: DC | PRN
  Administered 2022-02-19: 4 mg via INTRAVENOUS

## 2022-02-19 MED ORDER — OXYCODONE HCL ER 15 MG PO T12A
15.0000 mg | EXTENDED_RELEASE_TABLET | Freq: Two times a day (BID) | ORAL | Status: DC
  Administered 2022-02-19 – 2022-02-20 (×2): 15 mg via ORAL
  Filled 2022-02-19 (×2): qty 1

## 2022-02-19 MED ORDER — LIDOCAINE 2% (20 MG/ML) 5 ML SYRINGE
INTRAMUSCULAR | Status: AC
  Filled 2022-02-19: qty 5

## 2022-02-19 MED ORDER — BUPIVACAINE HCL (PF) 0.5 % IJ SOLN
INTRAMUSCULAR | Status: AC
  Filled 2022-02-19: qty 30

## 2022-02-19 MED ORDER — LIDOCAINE-EPINEPHRINE 0.5 %-1:200000 IJ SOLN
INTRAMUSCULAR | Status: AC
  Filled 2022-02-19: qty 1

## 2022-02-19 MED ORDER — DEXMEDETOMIDINE (PRECEDEX) IN NS 20 MCG/5ML (4 MCG/ML) IV SYRINGE
PREFILLED_SYRINGE | INTRAVENOUS | Status: DC | PRN
  Administered 2022-02-19: 20 ug via INTRAVENOUS
  Administered 2022-02-19: 12 ug via INTRAVENOUS

## 2022-02-19 MED ORDER — BUPIVACAINE HCL (PF) 0.5 % IJ SOLN
INTRAMUSCULAR | Status: DC | PRN
  Administered 2022-02-19: 30 mL

## 2022-02-19 MED ORDER — MECLIZINE HCL 25 MG PO TABS: 25.0000 mg | ORAL_TABLET | Freq: Three times a day (TID) | ORAL | Status: DC | PRN

## 2022-02-19 MED ORDER — MIDAZOLAM HCL 2 MG/2ML IJ SOLN
INTRAMUSCULAR | Status: DC | PRN
  Administered 2022-02-19: 2 mg via INTRAVENOUS

## 2022-02-19 SURGICAL SUPPLY — 57 items
ADH SKN CLS APL DERMABOND .7 (GAUZE/BANDAGES/DRESSINGS) ×1
ADH SKN CLS LQ APL DERMABOND (GAUZE/BANDAGES/DRESSINGS) ×1
APL SKNCLS STERI-STRIP NONHPOA (GAUZE/BANDAGES/DRESSINGS)
BAG COUNTER SPONGE SURGICOUNT (BAG) ×3 IMPLANT
BAG SPNG CNTER NS LX DISP (BAG) ×1
BAND INSRT 18 STRL LF DISP RB (MISCELLANEOUS) ×2
BAND RUBBER #18 3X1/16 STRL (MISCELLANEOUS) ×6 IMPLANT
BENZOIN TINCTURE PRP APPL 2/3 (GAUZE/BANDAGES/DRESSINGS) IMPLANT
BLADE CLIPPER SURG (BLADE) IMPLANT
BUR MATCHSTICK NEURO 3.0 LAGG (BURR) ×3 IMPLANT
BUR PRECISION FLUTE 5.0 (BURR) IMPLANT
CANISTER SUCT 3000ML PPV (MISCELLANEOUS) ×3 IMPLANT
CARTRIDGE OIL MAESTRO DRILL (MISCELLANEOUS) ×2 IMPLANT
DERMABOND ADHESIVE PROPEN (GAUZE/BANDAGES/DRESSINGS) ×1
DERMABOND ADVANCED (GAUZE/BANDAGES/DRESSINGS) ×1
DERMABOND ADVANCED .7 DNX12 (GAUZE/BANDAGES/DRESSINGS) ×2 IMPLANT
DERMABOND ADVANCED .7 DNX6 (GAUZE/BANDAGES/DRESSINGS) IMPLANT
DIFFUSER DRILL AIR PNEUMATIC (MISCELLANEOUS) ×3 IMPLANT
DRAPE LAPAROTOMY 100X72X124 (DRAPES) ×3 IMPLANT
DRAPE MICROSCOPE LEICA (MISCELLANEOUS) ×3 IMPLANT
DRAPE SURG 17X23 STRL (DRAPES) ×3 IMPLANT
DURAPREP 26ML APPLICATOR (WOUND CARE) ×3 IMPLANT
ELECT REM PT RETURN 9FT ADLT (ELECTROSURGICAL) ×2
ELECTRODE REM PT RTRN 9FT ADLT (ELECTROSURGICAL) ×2 IMPLANT
GAUZE 4X4 16PLY ~~LOC~~+RFID DBL (SPONGE) IMPLANT
GAUZE SPONGE 4X4 12PLY STRL (GAUZE/BANDAGES/DRESSINGS) IMPLANT
GLOVE ECLIPSE 6.5 STRL STRAW (GLOVE) ×3 IMPLANT
GLOVE EXAM NITRILE XL STR (GLOVE) IMPLANT
GOWN STRL REUS W/ TWL LRG LVL3 (GOWN DISPOSABLE) ×4 IMPLANT
GOWN STRL REUS W/ TWL XL LVL3 (GOWN DISPOSABLE) IMPLANT
GOWN STRL REUS W/TWL 2XL LVL3 (GOWN DISPOSABLE) IMPLANT
GOWN STRL REUS W/TWL LRG LVL3 (GOWN DISPOSABLE) ×4
GOWN STRL REUS W/TWL XL LVL3 (GOWN DISPOSABLE)
KIT BASIN OR (CUSTOM PROCEDURE TRAY) ×3 IMPLANT
KIT TURNOVER KIT B (KITS) ×3 IMPLANT
NDL HYPO 18GX1.5 BLUNT FILL (NEEDLE) IMPLANT
NDL HYPO 25X1 1.5 SAFETY (NEEDLE) ×2 IMPLANT
NDL SPNL 18GX3.5 QUINCKE PK (NEEDLE) IMPLANT
NEEDLE HYPO 18GX1.5 BLUNT FILL (NEEDLE) ×2 IMPLANT
NEEDLE HYPO 25X1 1.5 SAFETY (NEEDLE) ×2 IMPLANT
NEEDLE SPNL 18GX3.5 QUINCKE PK (NEEDLE) IMPLANT
NS IRRIG 1000ML POUR BTL (IV SOLUTION) ×3 IMPLANT
OIL CARTRIDGE MAESTRO DRILL (MISCELLANEOUS) ×2
PACK LAMINECTOMY NEURO (CUSTOM PROCEDURE TRAY) ×3 IMPLANT
PAD ARMBOARD 7.5X6 YLW CONV (MISCELLANEOUS) ×9 IMPLANT
SPIKE FLUID TRANSFER (MISCELLANEOUS) ×3 IMPLANT
SPONGE SURGIFOAM ABS GEL SZ50 (HEMOSTASIS) ×3 IMPLANT
SPONGE T-LAP 4X18 ~~LOC~~+RFID (SPONGE) IMPLANT
STRIP CLOSURE SKIN 1/2X4 (GAUZE/BANDAGES/DRESSINGS) IMPLANT
SUT VIC AB 0 CT1 18XCR BRD8 (SUTURE) ×2 IMPLANT
SUT VIC AB 0 CT1 8-18 (SUTURE)
SUT VIC AB 2-0 CT1 18 (SUTURE) ×4 IMPLANT
SUT VIC AB 3-0 SH 8-18 (SUTURE) ×4 IMPLANT
SYR 3ML LL SCALE MARK (SYRINGE) ×1 IMPLANT
TOWEL GREEN STERILE (TOWEL DISPOSABLE) ×3 IMPLANT
TOWEL GREEN STERILE FF (TOWEL DISPOSABLE) ×3 IMPLANT
WATER STERILE IRR 1000ML POUR (IV SOLUTION) ×3 IMPLANT

## 2022-02-19 NOTE — Transfer of Care (Signed)
Immediate Anesthesia Transfer of Care Note  Patient: Doloras L Bertha  Procedure(s) Performed: Redo Lumbar five-Sacral one Microdiscectomy (Back)  Patient Location: PACU  Anesthesia Type:General  Level of Consciousness: drowsy, patient cooperative and responds to stimulation  Airway & Oxygen Therapy: Patient Spontanous Breathing  Post-op Assessment: Report given to RN and Post -op Vital signs reviewed and stable  Post vital signs: Reviewed and stable  Last Vitals:  Vitals Value Taken Time  BP 117/67 02/19/22 1730  Temp    Pulse 72 02/19/22 1732  Resp 25 02/19/22 1732  SpO2 95 % 02/19/22 1732  Vitals shown include unvalidated device data.  Last Pain:  Vitals:   02/19/22 1000  TempSrc: Oral         Complications: No notable events documented.

## 2022-02-19 NOTE — Op Note (Signed)
02/19/2022  5:57 PM  PATIENT:  Kaylee Miller  31 y.o. female  PRE-OPERATIVE DIAGNOSIS:  Recurrent lumbar disc herniation L5/s1, right  POST-OPERATIVE DIAGNOSIS:  Recurrent lumbar disc herniation L5/S1, right  PROCEDURE:  Procedure(s): Redo Lumbar five-Sacral one Microdiscectomy  SURGEON:   Surgeon(s): Ashok Pall, MD  ASSISTANTS: NP Arnetha Massy  ANESTHESIA:   general  EBL:  Total I/O In: 1500 [I.V.:1500] Out: -   BLOOD ADMINISTERED:none  CELL SAVER GIVEN:none  COUNT:per nursing  DRAINS: none   SPECIMEN:  No Specimen  DICTATION: [name] was taken to the operating room, intubated and placed under a general anesthetic without difficulty. She was positioned prone on a Wilson frame with all pressure points padded. Her back was prepped and draped in a sterile manner. I opened the skin with a 10 blade and carried the dissection down to the thoracolumbar fascia. I used both sharp dissection and the monopolar cautery to expose the lamina of L5, and S1. I confirmed my location with an intraoperative xray.  I used the drill, Kerrison punches, and curettes to perform a semihemilaminectomy of L5. I brought the microscope into the operative field and with NP Meghan Bergman's assistance we started our decompression of the spinal canal, thecal sac and S1, and L5 root(s). I opened the disc space with a 15 blade and proceeded with the discectomy. I used pituitary rongeurs, curettes, and other instruments to remove disc material. After the discectomy was completed I inspected the S1 and L5 nerve roots and felt it was well decompressed. I explored rostrally, laterally, medially, and caudally and was satisfied with the decompression. I irrigated the wound, then closed in layers. I approximated the thoracolumbar fascia, subcutaneous, and subcuticular planes with vicryl sutures. I used dermabond for a sterile dressing.   PLAN OF CARE: Admit for overnight observation  PATIENT DISPOSITION:   PACU - hemodynamically stable.   Delay start of Pharmacological VTE agent (>24hrs) due to surgical blood loss or risk of bleeding:  no

## 2022-02-19 NOTE — Anesthesia Preprocedure Evaluation (Addendum)
Anesthesia Evaluation  Patient identified by MRN, date of birth, ID band Patient awake    Reviewed: Allergy & Precautions, NPO status , Patient's Chart, lab work & pertinent test results  History of Anesthesia Complications Negative for: history of anesthetic complications  Airway Mallampati: II  TM Distance: >3 FB Neck ROM: Full    Dental  (+) Dental Advisory Given, Teeth Intact   Pulmonary asthma , Current SmokerPatient did not abstain from smoking.,    Pulmonary exam normal        Cardiovascular hypertension, Normal cardiovascular exam     Neuro/Psych  Headaches, PSYCHIATRIC DISORDERS Anxiety Depression  CSF leak   Neuromuscular disease    GI/Hepatic Neg liver ROS, GERD  Controlled and Medicated,  Endo/Other   Obesity   Renal/GU negative Renal ROS     Musculoskeletal negative musculoskeletal ROS (+)   Abdominal   Peds  Hematology  (+) Blood dyscrasia, anemia ,   Anesthesia Other Findings HSV   Reproductive/Obstetrics                            Anesthesia Physical Anesthesia Plan  ASA: 2  Anesthesia Plan: General   Post-op Pain Management: Tylenol PO (pre-op)* and Celebrex PO (pre-op)*   Induction: Intravenous  PONV Risk Score and Plan: 3 and Treatment may vary due to age or medical condition, Ondansetron, Dexamethasone, Midazolam and Scopolamine patch - Pre-op  Airway Management Planned: Oral ETT  Additional Equipment: None  Intra-op Plan:   Post-operative Plan: Extubation in OR  Informed Consent: I have reviewed the patients History and Physical, chart, labs and discussed the procedure including the risks, benefits and alternatives for the proposed anesthesia with the patient or authorized representative who has indicated his/her understanding and acceptance.     Dental advisory given  Plan Discussed with: CRNA and Anesthesiologist  Anesthesia Plan Comments:         Anesthesia Quick Evaluation

## 2022-02-19 NOTE — H&P (Signed)
BP 112/73   Pulse 81   Temp 98.7 F (37.1 C) (Oral)   Resp 19   Ht '5\' 7"'$  (1.702 m)   Wt 90.3 kg   LMP 01/03/2022 (Exact Date) Comment: Negative u-preg in ED 02/17/22. BTF  SpO2 95%   BMI 31.17 kg/m  Kaylee Miller presents with a recurrent disc herniation at L5/S1 on the right. Shown on MRI from 6/22 She will be admitted and taken to the operating room for a redo discetomy Allergies  Allergen Reactions   Latex Swelling   Family History  Problem Relation Age of Onset   Cancer Mother    Breast cancer Mother    Hypertension Father    Stroke Father    CAD Maternal Grandfather    CAD Paternal Grandmother    CAD Paternal Grandfather    Diabetes Other        great-great grandmother   CAD Other    Social History   Socioeconomic History   Marital status: Single    Spouse name: Not on file   Number of children: Not on file   Years of education: Not on file   Highest education level: Not on file  Occupational History   Not on file  Tobacco Use   Smoking status: Every Day    Packs/day: 0.50    Years: 9.00    Total pack years: 4.50    Types: Cigarettes    Passive exposure: Never   Smokeless tobacco: Never  Vaping Use   Vaping Use: Never used  Substance and Sexual Activity   Alcohol use: Yes    Comment: Occa   Drug use: Never   Sexual activity: Not Currently    Birth control/protection: Injection    Comment: Depo Inj 12/2021  Other Topics Concern   Not on file  Social History Narrative   Not on file   Social Determinants of Health   Financial Resource Strain: Medium Risk (09/18/2020)   Overall Financial Resource Strain (CARDIA)    Difficulty of Paying Living Expenses: Somewhat hard  Food Insecurity: Food Insecurity Present (09/18/2020)   Hunger Vital Sign    Worried About Running Out of Food in the Last Year: Sometimes true    Ran Out of Food in the Last Year: Never true  Transportation Needs: No Transportation Needs (09/18/2020)   PRAPARE - Civil engineer, contracting (Medical): No    Lack of Transportation (Non-Medical): No  Physical Activity: Sufficiently Active (09/18/2020)   Exercise Vital Sign    Days of Exercise per Week: 5 days    Minutes of Exercise per Session: 100 min  Stress: No Stress Concern Present (09/18/2020)   Kaylee Miller    Feeling of Stress : Only a little  Social Connections: Moderately Integrated (09/18/2020)   Social Connection and Isolation Panel [NHANES]    Frequency of Communication with Friends and Family: More than three times a week    Frequency of Social Gatherings with Friends and Family: Twice a week    Attends Religious Services: More than 4 times per year    Active Member of [employer] or Organizations: Yes    Attends Archivist Meetings: 1 to 4 times per year    Marital Status: Never married  Intimate Partner Violence: Unknown (09/18/2020)   Humiliation, Afraid, Rape, and Kick questionnaire    Fear of Current or Ex-Partner: No    Emotionally Abused: Patient refused    Physically  Abused: No    Sexually Abused: No   Past Medical History:  Diagnosis Date   Anemia    Anxiety    Asthma    [employer]reesia 07/02/2020   Contraceptive management 02/08/2015   COVID    mild case   Depression    GERD (gastroesophageal reflux disease)    Headache(784.0)    migraines   History of bronchitis    HSV-2 (herpes simplex virus 2) infection    Pneumonia    Pregnant    S/P C-section 07/28/2013   Sciatic nerve pain    Seasonal allergies    Vaginal Pap smear, abnormal    Past Surgical History:  Procedure Laterality Date   adenoids     BACK SURGERY     CESAREAN SECTION  08/25/2005   CESAREAN SECTION N/A 07/28/2013   Procedure: CESAREAN SECTION;  Surgeon: Jonnie Kind, MD;  Location: [facility];  Service: Obstetrics;  Laterality: N/A;   LUMBAR LAMINECTOMY/DECOMPRESSION MICRODISCECTOMY Right 05/25/2021   Procedure: DISCECTOMY Lumbar five -  Sacral one;  Surgeon: Ashok Pall, MD;  Location: [facility];  Service: Neurosurgery;  Laterality: Right;   LUMBAR LAMINECTOMY/DECOMPRESSION MICRODISCECTOMY Right 01/15/2022   Procedure: Right Lumbar Five-Sacral One Microdiscectomy;  Surgeon: Ashok Pall, MD;  Location: [facility];  Service: Neurosurgery;  Laterality: Right;  3C/RM 19   LUMBAR LAMINECTOMY/DECOMPRESSION MICRODISCECTOMY N/A 01/30/2022   Procedure: REVISION L5-S1 LAMINECTOMY REPAIR CSS LEAK;  Surgeon: Dawley, Theodoro Doing, DO;  Location: [place];  Service: Neurosurgery;  Laterality: N/A;   tubes in ears     Prior to Admission medications   Medication Sig Start Date End Date Taking? Authorizing Provider  aspirin-acetaminophen-caffeine (EXCEDRIN MIGRAINE) [phone] MG tablet Take 2 tablets by mouth every 6 (six) hours as needed for migraine.   Yes [provider]  docusate sodium (COLACE) 100 MG capsule Take 200 mg by mouth daily.   Yes [provider]  medroxyPROGESTERone Acetate 150 MG/ML SUSY Inject 1 mL (150 mg total) into the muscle every 3 (three) months. INJECT 1 ML INTO THE MUSCLE EVERY 3 MONTHS Strength: 150 mg/mL 08/21/21  Yes Derrek Monaco A, NP  Melatonin 5 MG CAPS Take 5 mg by mouth at bedtime as needed (sleep).   Yes [provider]  methylPREDNISolone (MEDROL DOSEPAK) 4 MG TBPK tablet Take 6 tablets day one, 5 tablets day two, 4 tablets day three, 3 tablets day four, 2 tablets day five, then 1 tablet day six 02/17/22  Yes Triplett, Tammy, PA-C  ondansetron (ZOFRAN ODT) 4 MG disintegrating tablet Take 1 tablet (4 mg total) by mouth every 8 (eight) hours as needed for nausea or vomiting. 12/19/21  Yes Derrek Monaco A, NP  oxyCODONE (OXY IR/ROXICODONE) 5 MG immediate release tablet Take 1 tablet (5 mg total) by mouth every 6 (six) hours as needed for up to 10 doses. 02/13/22  Yes Hong, Greggory Brandy, MD  tiZANidine (ZANAFLEX) 4 MG tablet Take 1 tablet (4 mg total) by mouth every 6 (six) hours as needed for muscle  spasms. 01/16/22  Yes Ashok Pall, MD  albuterol (VENTOLIN HFA) 108 (90 Base) MCG/ACT inhaler Inhale 2 puffs into the lungs every 6 (six) hours as needed for wheezing or shortness of breath. 06/10/20   Rolland Porter, MD  escitalopram (LEXAPRO) 10 MG tablet Take 1 tablet (10 mg total) by mouth daily. Patient not taking: Reported on 02/13/2022 01/21/22 01/21/23  Estill Dooms, NP  fluticasone [facility]) 50 MCG/ACT nasal spray Place 2 sprays into both nostrils daily  as needed for allergies or rhinitis.    [provider]  gabapentin (NEURONTIN) 100 MG capsule Take 1 capsule (100 mg total) by mouth 3 (three) times daily. 02/17/22   Triplett, Tammy, PA-C  ibuprofen (ADVIL) 200 MG tablet Take 800 mg by mouth every 6 (six) hours as needed for cramping.    [provider]  meclizine (ANTIVERT) 25 MG tablet Take 1 tablet (25 mg total) by mouth 3 (three) times daily as needed for dizziness. 01/11/21   Raspet, Derry Skill, PA-C  SUMAtriptan (IMITREX) 50 MG tablet TAKE 1 TABLET BY MOUTH EVERY 2 HOURS AS NEEDED FOR MIGRAINE. MAY REPEAT IN 2 HOURS IF HEADACHE PERSISTS OR RECURS. MAXIMUM OF 2 DOSES. Patient taking differently: Take 50 mg by mouth every 2 (two) hours as needed for migraine. 01/27/22   Lindell Spar, MD  Vitamin D, Ergocalciferol, (DRISDOL) 1.25 MG (50000 UNIT) CAPS capsule Take 1 capsule (50,000 Units total) by mouth every 7 (seven) days. 12/19/20   Lindell Spar, MD   Physical Exam Constitutional:      General: She is in acute distress.     Appearance: She is normal weight. She is ill-appearing.  HENT:     Head: Normocephalic and atraumatic.     Right Ear: External ear normal.     Left Ear: External ear normal.     Nose: Nose normal.     Mouth/Throat:     Mouth: Mucous membranes are moist.     Pharynx: Oropharynx is clear.  Eyes:     Extraocular Movements: Extraocular movements intact.     Pupils: Pupils are equal, round, and reactive to light.  Cardiovascular:     Rate and  Rhythm: Normal rate and regular rhythm.  Pulmonary:     Effort: Pulmonary effort is normal.     Breath sounds: Normal breath sounds.  Abdominal:     General: Abdomen is flat.     Palpations: Abdomen is soft.  Musculoskeletal:        General: Normal range of motion.     Cervical back: Normal range of motion and neck supple.  Skin:    General: Skin is warm and dry.  Neurological:     Mental Status: She is alert and oriented to person, place, and time.     Cranial Nerves: Cranial nerves 2-12 are intact.     Motor: Weakness present.     Coordination: Coordination abnormal.     Gait: Gait abnormal and tandem walk abnormal.     Deep Tendon Reflexes: Babinski sign absent on the right side. Babinski sign absent on the left side.  Psychiatric:        Mood and Affect: Mood normal.        Behavior: Behavior normal.        Thought Content: Thought content normal.   OR for redo L5/S1 discetomy Risks and benefits explained.  BP 112/73   Pulse 81   Temp 98.7 F (37.1 C) (Oral)   Resp 19   Ht '5\' 7"'$  (1.702 m)   Wt 90.3 kg   LMP 01/03/2022 (Exact Date) Comment: Negative u-preg in ED 02/17/22. 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 Risks and benefits including but not limited to bleeding, infection, paralysis, weakness in one or both extremities, bowel and/or bladder dysfunction, need for further surgery, no relief of pain. Kaylee Miller understands and wishes  to proceed.

## 2022-02-19 NOTE — Anesthesia Procedure Notes (Signed)
Procedure Name: Intubation Date/Time: 02/19/2022 3:31 PM  Performed by: Lance Coon, CRNAPre-anesthesia Checklist: Patient identified, Emergency Drugs available, Suction available, Patient being monitored and Timeout performed Patient Re-evaluated:Patient Re-evaluated prior to induction Oxygen Delivery Method: Circle system utilized Preoxygenation: Pre-oxygenation with 100% oxygen Induction Type: IV induction Ventilation: Mask ventilation without difficulty Laryngoscope Size: 3 Grade View: Grade I Tube type: Oral Tube size: 7.0 mm Number of attempts: 1 Airway Equipment and Method: Stylet Placement Confirmation: ETT inserted through vocal cords under direct vision, positive ETCO2 and breath sounds checked- equal and bilateral Secured at: 21 cm Tube secured with: Tape Dental Injury: Teeth and Oropharynx as per pre-operative assessment

## 2022-02-20 ENCOUNTER — Encounter (HOSPITAL_COMMUNITY): Payer: Self-pay | Admitting: Neurosurgery

## 2022-02-20 DIAGNOSIS — M5127 Other intervertebral disc displacement, lumbosacral region: Secondary | ICD-10-CM | POA: Diagnosis not present

## 2022-02-20 MED ORDER — OXYCODONE HCL ER 15 MG PO T12A: 15.0000 mg | EXTENDED_RELEASE_TABLET | Freq: Two times a day (BID) | ORAL | 0 refills | Status: DC

## 2022-02-20 NOTE — Discharge Summary (Signed)
Physician Discharge Summary  Patient ID: Kaylee Miller MRN: [id number] DOB/AGE: 01-Jul-1991 31 y.o.  Admit date: 02/19/2022 Discharge date: 02/20/2022  Admission Diagnoses:recurrent displaced L5/S1 disc  Discharge Diagnoses: same Principal Problem:   HNP (herniated nucleus pulposus), lumbar   Discharged Condition: good  Hospital Course: [name] presented with a recurrent disc herniation at L5/S1 on right side. Post op she is ambulating, voiding, and tolerating a regular diet Wound is clean, dry, and without signs of infection.   Treatments: surgery: right redo L5/S1 discetomy  Discharge Exam: Blood pressure 103/73, pulse 81, temperature 99.5 F (37.5 C), temperature source Oral, resp. rate 16, height '5\' 7"'$  (1.702 m), weight 90.3 kg, last menstrual period 01/03/2022, SpO2 98 %. General appearance: alert, cooperative, appears stated age, and no distress  Disposition: Discharge disposition: 01-Home or Self Care      Recurrent lumbar disc herniation  Allergies as of 02/20/2022       Reactions   Latex Swelling        Medication List     TAKE these medications    albuterol 108 (90 Base) MCG/ACT inhaler Commonly known as: VENTOLIN HFA Inhale 2 puffs into the lungs every 6 (six) hours as needed for wheezing or shortness of breath.   aspirin-acetaminophen-caffeine 250-250-65 MG tablet Commonly known as: EXCEDRIN MIGRAINE Take 2 tablets by mouth every 6 (six) hours as needed for migraine.   docusate sodium 100 MG capsule Commonly known as: COLACE Take 200 mg by mouth daily.   fluticasone 50 MCG/ACT nasal spray Commonly known as: FLONASE Place 2 sprays into both nostrils daily as needed for allergies or rhinitis.   gabapentin 100 MG capsule Commonly known as: Neurontin Take 1 capsule (100 mg total) by mouth 3 (three) times daily.   ibuprofen 200 MG tablet Commonly known as: ADVIL Take 800 mg by mouth every 6 (six) hours as needed for cramping.    meclizine 25 MG tablet Commonly known as: ANTIVERT Take 1 tablet (25 mg total) by mouth 3 (three) times daily as needed for dizziness.   medroxyPROGESTERone Acetate 150 MG/ML Susy Inject 1 mL (150 mg total) into the muscle every 3 (three) months. INJECT 1 ML INTO THE MUSCLE EVERY 3 MONTHS Strength: 150 mg/mL   Melatonin 5 MG Caps Take 5 mg by mouth at bedtime as needed (sleep).   methylPREDNISolone 4 MG Tbpk tablet Commonly known as: MEDROL DOSEPAK Take 6 tablets day one, 5 tablets day two, 4 tablets day three, 3 tablets day four, 2 tablets day five, then 1 tablet day six   ondansetron 4 MG disintegrating tablet Commonly known as: Zofran ODT Take 1 tablet (4 mg total) by mouth every 8 (eight) hours as needed for nausea or vomiting.   oxyCODONE 5 MG immediate release tablet Commonly known as: Oxy IR/ROXICODONE Take 1 tablet (5 mg total) by mouth every 6 (six) hours as needed for up to 10 doses. What changed: Another medication with the same name was added. Make sure you understand how and when to take each.   oxyCODONE 15 mg 12 hr tablet Commonly known as: OXYCONTIN Take 1 tablet (15 mg total) by mouth every 12 (twelve) hours. What changed: You were already taking a medication with the same name, and this prescription was added. Make sure you understand how and when to take each.   SUMAtriptan 50 MG tablet Commonly known as: IMITREX TAKE 1 TABLET BY MOUTH EVERY 2 HOURS AS NEEDED FOR MIGRAINE. MAY REPEAT IN 2 HOURS IF HEADACHE  PERSISTS OR RECURS. MAXIMUM OF 2 DOSES. What changed: See the new instructions.   tiZANidine 4 MG tablet Commonly known as: ZANAFLEX Take 1 tablet (4 mg total) by mouth every 6 (six) hours as needed for muscle spasms.   Vitamin D (Ergocalciferol) 1.25 MG (50000 UNIT) Caps capsule Commonly known as: DRISDOL Take 1 capsule (50,000 Units total) by mouth every 7 (seven) days.        Follow-up Information     Ashok Pall, MD Follow up.   Specialty:  Neurosurgery Why: keep your scheduled appointment Contact information: [address] [place] [address] [phone]                 Signed: Ashok Pall 02/20/2022, 1:56 PM

## 2022-02-20 NOTE — Discharge Instructions (Signed)
Lumbar Discectomy °Care After °A discectomy involves removal of discmaterial (the cartilage-like structures located between the bones of the back). It is done to relieve pressure on nerve roots. It can be used as a treatment for a back problem. The time in surgery depends on the findings in surgery and what is necessary to correct the problems. °HOME CARE INSTRUCTIONS  °· Check the cut (incision) made by the surgeon twice a day for signs of infection. Some signs of infection may include:  °· A foul smelling, greenish or yellowish discharge from the wound.  °· Increased pain.  °· Increased redness over the incision (operative) site.  °· The skin edges may separate.  °· Flu-like symptoms (problems).  °· A temperature above 101.5° F (38.6° C).  °· Change your bandages in about 24 to 36 hours following surgery or as directed.  °· You may shower tomrrow.  Avoid bathtubs, swimming pools and hot tubs for three weeks or until your incision has healed completely. °· Follow your doctor's instructions as to safe activities, exercises, and physical therapy.  °· Weight reduction may be beneficial if you are overweight.  °· Daily exercise is helpful to prevent the return of problems. Walking is permitted. You may use a treadmill without an incline. Cut down on activities and exercise if you have discomfort. You may also go up and down stairs as much as you can tolerate.  °· DO NOT lift anything heavier than 10 to 15 lbs. Avoid bending or twisting at the waist. Always bend your knees when lifting.  °· Maintain strength and range of motion as instructed.  °· Do not drive for 10 days, or as directed by your doctors. You may be a passenger . Lying back in the passenger seat may be more comfortable for you. Always wear a seatbelt.  °· Limit your sitting in a regular chair to 20 to 30 minutes at a time. There are no limitations for sitting in a recliner. You should lie down or walk in between sitting periods.  °· Only take  over-the-counter or prescription medicines for pain, discomfort, or fever as directed by your caregiver.  °SEEK MEDICAL CARE IF:  °· There is increased bleeding (more than a small spot) from the wound.  °· You notice redness, swelling, or increasing pain in the wound.  °· Pus is coming from wound.  °· You develop an unexplained oral temperature above 102° F (38.9° C) develops.  °· You notice a foul smell coming from the wound or dressing.  °· You have increasing pain in your wound.  °SEEK IMMEDIATE MEDICAL CARE IF:  °· You develop a rash.  °· You have difficulty breathing.  °· You develop any allergic problems to medicines given.  °Document Released: 07/16/2004 Document Revised: 07/31/2011 Document Reviewed: 11/04/2007 °ExitCare® Patient Information °

## 2022-02-20 NOTE — Progress Notes (Signed)
PT Cancellation Note and Discharge  Patient Details Name: Kaylee Miller MRN: [id number] DOB: 1991/08/13   Cancelled Treatment:    Reason Eval/Treat Not Completed: PT screened, no needs identified, will sign off.  Discussed pt case with OT who reports pt is currently mobilizing well and does not require a formal PT evaluation at this time. PT signing off. If needs change, please reconsult.     Thelma Comp 02/20/2022, 9:37 AM  Rolinda Roan, PT, DPT Acute Rehabilitation Services Secure Chat Preferred Office: [phone]

## 2022-02-20 NOTE — Consult Note (Signed)
   [facility] CM Inpatient Consult   02/20/2022  Kaylee Miller 01/23/1991 [id number]  Managed Medicaid [MM]  Patient active in MM care coordination with MM team.  Notified team of post hospital follow up.  [name], RN BSN [facility] Liaison  [phone] business mobile phone Toll free office [phone]  Fax number: [phone] [name]

## 2022-02-20 NOTE — Plan of Care (Signed)
  Problem: Skin Integrity: Goal: Risk for impaired skin integrity will decrease Outcome: Completed/Met

## 2022-02-20 NOTE — Anesthesia Postprocedure Evaluation (Signed)
Anesthesia Post Note  Patient: Kaylee Miller  Procedure(s) Performed: Redo Lumbar five-Sacral one Microdiscectomy (Back)     Patient location during evaluation: PACU Anesthesia Type: General Level of consciousness: awake and alert Pain management: pain level controlled Vital Signs Assessment: post-procedure vital signs reviewed and stable Respiratory status: spontaneous breathing, nonlabored ventilation and respiratory function stable Cardiovascular status: stable and blood pressure returned to baseline Anesthetic complications: no   No notable events documented.  Last Vitals:  Vitals:   02/20/22 0523 02/20/22 0740  BP: 106/61 103/73  Pulse: 68 81  Resp: 16 16  Temp: 37.2 C 37.5 C  SpO2: 98% 98%    Last Pain:  Vitals:   02/20/22 0740  TempSrc: Oral  PainSc:                  [name]

## 2022-02-20 NOTE — Progress Notes (Signed)
Patient alert and oriented, mae's well, voiding adequate amount of urine, swallowing without difficulty, no c/o pain at time of discharge. Patient discharged home with family. Script and discharged instructions given to patient. Patient and family stated understanding of instructions given. Patient has an appointment with Dr. Cabbell   

## 2022-02-20 NOTE — Evaluation (Signed)
Occupational Therapy Evaluation/Discharge Patient Details Name: Kaylee Miller MRN: [id number] DOB: [birth year] Today's Date: 02/20/2022   History of Present Illness Pt is a 31 y/o female admitted for redo of L5-S1 microdiscetomy due to recurrent disc herniation. PM: CSF leak (adm 01/29/22) and hx of back sx   Clinical Impression   PTA, pt lives with her children (43 and 54 y/o), typically Modified Independent with ADLs, IADLs and mobility (intermittent cane use) though limited by progressive back pain (using BSC most recently PTA). Pt presents now with continued R LE (behind the knee) impacting fluid gait. Pt familiar with back precautions, able to implement in full ADL session with Modified Independence. Pt able to mobilize without AD short distances. Educated re: stretches for RLE to encourage comfortable extension, body mechanics for IADLs, tub shower transfers, and assist for transportation/carrying in groceries. Pt denied any concerns managing steps into home and feel pt can will progress well without need for skilled OT services.       Recommendations for follow up therapy are one component of a multi-disciplinary discharge planning process, led by the attending physician.  Recommendations may be updated based on patient status, additional functional criteria and insurance authorization.   Follow Up Recommendations  No OT follow up    Assistance Recommended at Discharge PRN  Patient can return home with the following Assistance with cooking/housework;Assist for transportation    Functional Status Assessment  Patient has had a recent decline in their functional status and demonstrates the ability to make significant improvements in function in a reasonable and predictable amount of time.  Equipment Recommendations  None recommended by OT    Recommendations for Other Services       Precautions / Restrictions Precautions Precautions: Back Precaution Booklet Issued: Yes  (comment) Precaution Comments: no brace needed Restrictions Weight Bearing Restrictions: No      Mobility Bed Mobility Overal bed mobility: Modified Independent             General bed mobility comments: good implementation of back precautions    Transfers Overall transfer level: Modified independent Equipment used: None, Rolling walker (2 wheels)               General transfer comment: initially used RW but able to stand without assist and without AD easily      Balance Overall balance assessment: Mild deficits observed, not formally tested (primarily due to attempts to offload discomfort from R LE)                                         ADL either performed or assessed with clinical judgement   ADL Overall ADL's : Modified independent                                       General ADL Comments: initially used RW to get to bathroom though noted to push it out of the way. pt able to demo mobility without AD in room though limping gait d/t R knee pain. Pt able to complete standing sponge bathing and seated dressing tasks without assist and while minding back precautions. Educated on IADL mgmt, kitchen task modification, importance of body mechanics and assistance with heavy groceries/transportation for children to sports practice, etc. Educated on alternative tub shower options, stair mgmt  Vision Ability to See in Adequate Light: 0 Adequate Patient Visual Report: No change from baseline Vision Assessment?: No apparent visual deficits     Perception     Praxis      Pertinent Vitals/Pain Pain Assessment Pain Assessment: Faces Faces Pain Scale: Hurts little more Pain Location: R LE behind knee Pain Descriptors / Indicators: Sore, Guarding Pain Intervention(s): Monitored during session, Limited activity within patient's tolerance, Other (comment) (educated in stretching exercises)     Hand Dominance Right    Extremity/Trunk Assessment Upper Extremity Assessment Upper Extremity Assessment: Overall WFL for tasks assessed   Lower Extremity Assessment Lower Extremity Assessment: RLE deficits/detail RLE Deficits / Details: pain behind back of knee though able to fully extend in standing. educated on standing and lying stretches to encourage knee extension   Cervical / Trunk Assessment Cervical / Trunk Assessment: Normal;Back Surgery   Communication Communication Communication: No difficulties   Cognition Arousal/Alertness: Awake/alert Behavior During Therapy: WFL for tasks assessed/performed Overall Cognitive Status: Within Functional Limits for tasks assessed                                       General Comments  mother at bedside    Exercises     Shoulder Instructions      Home Living Family/patient expects to be discharged to:: Private residence Living Arrangements: Children Available Help at Discharge: Family;Available PRN/intermittently Type of Home: House Home Access: Stairs to enter [employer] of Steps: 3 Entrance Stairs-Rails: None Home Layout: One level     Bathroom Shower/Tub: Teacher, early years/pre: Standard     Home Equipment: Conservation officer, nature (2 wheels);Cane - single point;BSC/3in1;Shower seat          Prior Functioning/Environment Prior Level of Function : Independent/Modified Independent;Driving             Mobility Comments: Using a cane at times (primarily outside of the house) ADLs Comments: Typically Mod I for ADLs (though has been using BSC in bedroom recently due to back pain), IADLs. 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 Cares for her 60 and 103 y/o children        OT Problem List: Pain;Decreased knowledge of use of DME or AE;Decreased knowledge of precautions      OT Treatment/Interventions:      OT Goals(Current goals can be found in the care plan section) Acute Rehab OT  Goals Patient Stated Goal: decreased pain, be able to walk normally again, manage in kitchen at home OT Goal Formulation: All assessment and education complete, DC therapy  OT Frequency:      Co-evaluation              AM-PAC OT "6 Clicks" Daily Activity     Outcome Measure Help from another person eating meals?: None Help from another person taking care of personal grooming?: None Help from another person toileting, which includes using toliet, bedpan, or urinal?: None Help from another person bathing (including washing, rinsing, drying)?: None Help from another person to put on and taking off regular upper body clothing?: None Help from another person to put on and taking off regular lower body clothing?: None 6 Click Score: 24   End of Session Nurse Communication: Mobility status  Activity Tolerance: Patient tolerated treatment well Patient left: in bed;with call bell/phone within reach;with family/visitor present  OT Visit Diagnosis: Other abnormalities of  gait and mobility (R26.89)                Time: 9068-9340 OT Time Calculation (min): 30 min Charges:  OT General Charges $OT Visit: 1 Visit OT Evaluation $OT Eval Low Complexity: 1 Low OT Treatments $Self Care/Home Management : 8-22 mins  Malachy Chamber, OTR/L Acute Rehab Services Office: [phone]   Layla Maw 02/20/2022, 10:01 AM

## 2022-02-21 ENCOUNTER — Telehealth: Payer: Self-pay | Admitting: *Deleted

## 2022-02-21 NOTE — Patient Outreach (Signed)
Care Coordination  02/21/2022  Kaylee Miller [birth year] [id number]  Opened in error  Lurena Joiner RN, BSN [place] RN Care Coordinator

## 2022-02-21 NOTE — Telephone Encounter (Signed)
Transition Care Management Follow-up Telephone Call Date of discharge and from where: 02-20-22 How have you been since you were released from the hospital? Doing ok  Any questions or concerns? No  Items Reviewed: Did the pt receive and understand the discharge instructions provided? Yes  Medications obtained and verified? Yes  Other? No  Any new allergies since your discharge? No  Dietary orders reviewed? Yes Do you have support at home? Yes   Home Care and Equipment/Supplies: Were home health services ordered? not applicable If so, what is the name of the agency? NA  Has the agency set up a time to come to the patient's home? not applicable Were any new equipment or medical supplies ordered?  No What is the name of the medical supply agency? NA Were you able to get the supplies/equipment? not applicable Do you have any questions related to the use of the equipment or supplies? No  Functional Questionnaire: (I = Independent and D = Dependent) ADLs: i  Bathing/Dressing- i  Meal Prep- i  Eating- i  Maintaining continence- i  Transferring/Ambulation- i  Managing Meds- i  Follow up appointments reviewed:  PCP Hospital f/u appt confirmed? Yes  Scheduled to see 03-06-22 on patel @ 8:40. [facility] f/u appt confirmed? No   Are transportation arrangements needed? No  If their condition worsens, is the pt aware to call PCP or go to the Emergency Dept.? Yes Was the patient provided with contact information for the PCP's office or ED? Yes Was to pt encouraged to call back with questions or concerns? Yes

## 2022-03-06 ENCOUNTER — Encounter: Payer: Self-pay | Admitting: Internal Medicine

## 2022-03-06 ENCOUNTER — Ambulatory Visit (INDEPENDENT_AMBULATORY_CARE_PROVIDER_SITE_OTHER): Payer: Medicaid Other | Admitting: Internal Medicine

## 2022-03-06 VITALS — BP 118/82 | HR 84 | Resp 18 | Ht 67.0 in | Wt 199.8 lb

## 2022-03-06 DIAGNOSIS — F32A Depression, unspecified: Secondary | ICD-10-CM

## 2022-03-06 DIAGNOSIS — Z09 Encounter for follow-up examination after completed treatment for conditions other than malignant neoplasm: Secondary | ICD-10-CM | POA: Diagnosis not present

## 2022-03-06 DIAGNOSIS — M5126 Other intervertebral disc displacement, lumbar region: Secondary | ICD-10-CM

## 2022-03-06 DIAGNOSIS — F419 Anxiety disorder, unspecified: Secondary | ICD-10-CM

## 2022-03-06 MED ORDER — NAPROXEN 500 MG PO TABS: 500.0000 mg | ORAL_TABLET | Freq: Two times a day (BID) | ORAL | 0 refills | Status: DC

## 2022-03-06 MED ORDER — TIZANIDINE HCL 4 MG PO TABS: 4.0000 mg | ORAL_TABLET | Freq: Three times a day (TID) | ORAL | 0 refills | Status: DC | PRN

## 2022-03-06 MED ORDER — ESCITALOPRAM OXALATE 10 MG PO TABS: 5.0000 mg | ORAL_TABLET | Freq: Every day | ORAL | 1 refills | Status: DC

## 2022-03-06 NOTE — Assessment & Plan Note (Signed)
Uncontrolled currently Restart Lexapro 5 mg QD Low risk for serotonin syndrome with Naproxen

## 2022-03-06 NOTE — Assessment & Plan Note (Signed)
S/p lumbar laminecomy with microdiscectomy in 10/22 S/p L5-S1 re-do microdiscectomy in 06/23 Work restrictions discussed F/u with Spine surgeon for back pain and RLE numbness and weakness Refilled naproxen and tizanidine Oxycodone as needed for severe pain

## 2022-03-06 NOTE — Patient Instructions (Signed)
Please take Naproxen for mild-moderate pain. Please take Oxycodone for severe pain.  Please continue taking Gabapentin for neuropathic pain.  Please take Tizanidine for muscle spasms.  Please avoid heavy lifting and frequent bending.

## 2022-03-06 NOTE — Assessment & Plan Note (Signed)
Hospital chart reviewed including discharge summary S/p re-do microdiscectomy Incision site C/D/I

## 2022-03-06 NOTE — Progress Notes (Signed)
Established Patient Office Visit  Subjective:  Patient ID: Kaylee Miller, female    DOB: July 18, 1991  Age: 31 y.o. MRN: [id number]  CC:  Chief Complaint  Patient presents with   Transitions Of Care    [facility] pt discharged 02-20-22 pt had disc replacement surgery she is hurting in her lower back really bad    HPI Kaylee Miller is a 31 y.o. female with past medical history of migraine, herniated lumbar disc s/p microdiscectomy and tobacco abuse who presents for f/u after her recent hospitalization for L5-S1 microdiscectomy.  She recently had CSF leak after an L5-S1 microdiscectomy, and was sent to ER.  She had revision L5-S1 laminectomy repair for CSF leak on 01/30/2022.  She continue to have acute right-sided low back pain with radiating symptoms to RLE.  She had re-do L5-S1 microdiscectomy on 02/19/2022.  She still complains of low back pain and RLE numbness and weakness.  Of note, she has run out of her oxycodone and tizanidine.  She denies any incision site discharge or bleeding currently.  Denies any headache or dizziness currently.  Denies any urinary or stool incontinence.  Denies any fever, chills, nausea or vomiting currently.   She was taking Lexapro for GAD.  She was told to avoid Lexapro by her OB/GYN while taking Naproxen.  She has been feeling anxious recently since stopping Lexapro.  She denies any SI or HI currently.  Past Medical History:  Diagnosis Date   Anemia    Anxiety    Asthma    Phreesia 07/02/2020   Contraceptive management 02/08/2015   COVID    mild case   CSF leak 01/29/2022   Depression    GERD (gastroesophageal reflux disease)    Headache(784.0)    migraines   History of bronchitis    HSV-2 (herpes simplex virus 2) infection    Pneumonia    Pregnant    S/P C-section 07/28/2013   Sciatic nerve pain    Seasonal allergies    Vaginal Pap smear, abnormal     Past Surgical History:  Procedure Laterality Date   adenoids     BACK SURGERY      CESAREAN SECTION  08/25/2005   CESAREAN SECTION N/A 07/28/2013   Procedure: CESAREAN SECTION;  Surgeon: Jonnie Kind, MD;  Location: [facility];  Service: Obstetrics;  Laterality: N/A;   LUMBAR LAMINECTOMY/DECOMPRESSION MICRODISCECTOMY Right 05/25/2021   Procedure: DISCECTOMY Lumbar five - Sacral one;  Surgeon: Ashok Pall, MD;  Location: [place];  Service: Neurosurgery;  Laterality: Right;   LUMBAR LAMINECTOMY/DECOMPRESSION MICRODISCECTOMY Right 01/15/2022   Procedure: Right Lumbar Five-Sacral One Microdiscectomy;  Surgeon: Ashok Pall, MD;  Location: [place];  Service: Neurosurgery;  Laterality: Right;  3C/RM 19   LUMBAR LAMINECTOMY/DECOMPRESSION MICRODISCECTOMY N/A 01/30/2022   Procedure: REVISION L5-S1 LAMINECTOMY REPAIR CSS LEAK;  Surgeon: Dawley, Theodoro Doing, DO;  Location: [place];  Service: Neurosurgery;  Laterality: N/A;   LUMBAR LAMINECTOMY/DECOMPRESSION MICRODISCECTOMY N/A 02/19/2022   Procedure: Redo Lumbar five-Sacral one Microdiscectomy;  Surgeon: Ashok Pall, MD;  Location: [place];  Service: Neurosurgery;  Laterality: N/A;   tubes in ears      Family History  Problem Relation Age of Onset   Cancer Mother    Breast cancer Mother    Hypertension Father    Stroke Father    CAD Maternal Grandfather    CAD Paternal Grandmother    CAD Paternal Grandfather    Diabetes Other        great-great grandmother  CAD Other     Social History   Socioeconomic History   Marital status: Single    Spouse name: Not on file   Number of children: Not on file   Years of education: Not on file   Highest education level: Not on file  Occupational History   Not on file  Tobacco Use   Smoking status: Every Day    Packs/day: 0.50    Years: 9.00    Total pack years: 4.50    Types: Cigarettes    Passive exposure: Never   Smokeless tobacco: Never  Vaping Use   Vaping Use: Never used  Substance and Sexual Activity   Alcohol use: Yes    Comment: Occa   Drug use: Never   Sexual activity: Not  Currently    Birth control/protection: Injection    Comment: Depo Inj 12/2021  Other Topics Concern   Not on file  Social History Narrative   Not on file   Social Determinants of Health   Financial Resource Strain: Medium Risk (09/18/2020)   Overall Financial Resource Strain (CARDIA)    Difficulty of Paying Living Expenses: Somewhat hard  Food Insecurity: Food Insecurity Present (09/18/2020)   Hunger Vital Sign    Worried About Running Out of Food in the Last Year: Sometimes true    Ran Out of Food in the Last Year: Never true  Transportation Needs: No Transportation Needs (09/18/2020)   PRAPARE - Hydrologist (Medical): No    Lack of Transportation (Non-Medical): No  Physical Activity: Sufficiently Active (09/18/2020)   Exercise Vital Sign    Days of Exercise per Week: 5 days    Minutes of Exercise per Session: 100 min  Stress: No Stress Concern Present (09/18/2020)   [place]    Feeling of Stress : Only a little  Social Connections: Moderately Integrated (09/18/2020)   Social Connection and Isolation Panel [NHANES]    Frequency of Communication with Friends and Family: More than three times a week    Frequency of Social Gatherings with Friends and Family: Twice a week    Attends Religious Services: More than 4 times per year    Active Member of [employer] or Organizations: Yes    Attends Archivist Meetings: 1 to 4 times per year    Marital Status: Never married  Intimate Partner Violence: Unknown (09/18/2020)   Humiliation, Afraid, Rape, and Kick questionnaire    Fear of Current or Ex-Partner: No    Emotionally Abused: Patient refused    Physically Abused: No    Sexually Abused: No    Outpatient Medications Prior to Visit  Medication Sig Dispense Refill   albuterol (VENTOLIN HFA) 108 (90 Base) MCG/ACT inhaler Inhale 2 puffs into the lungs every 6 (six) hours as needed for  wheezing or shortness of breath. 6.7 g 0   aspirin-acetaminophen-caffeine (EXCEDRIN MIGRAINE) 250-250-65 MG tablet Take 2 tablets by mouth every 6 (six) hours as needed for migraine.     docusate sodium (COLACE) 100 MG capsule Take 200 mg by mouth daily.     fluticasone (FLONASE) 50 MCG/ACT nasal spray Place 2 sprays into both nostrils daily as needed for allergies or rhinitis.     gabapentin (NEURONTIN) 100 MG capsule Take 1 capsule (100 mg total) by mouth 3 (three) times daily. 30 capsule 0   meclizine (ANTIVERT) 25 MG tablet Take 1 tablet (25 mg total) by mouth 3 (three) times  daily as needed for dizziness. 30 tablet 0   medroxyPROGESTERone Acetate 150 MG/ML SUSY Inject 1 mL (150 mg total) into the muscle every 3 (three) months. INJECT 1 ML INTO THE MUSCLE EVERY 3 MONTHS Strength: 150 mg/mL 1 mL 4   Melatonin 5 MG CAPS Take 5 mg by mouth at bedtime as needed (sleep).     ondansetron (ZOFRAN ODT) 4 MG disintegrating tablet Take 1 tablet (4 mg total) by mouth every 8 (eight) hours as needed for nausea or vomiting. 20 tablet 0   SUMAtriptan (IMITREX) 50 MG tablet TAKE 1 TABLET BY MOUTH EVERY 2 HOURS AS NEEDED FOR MIGRAINE. MAY REPEAT IN 2 HOURS IF HEADACHE PERSISTS OR RECURS. MAXIMUM OF 2 DOSES. (Patient taking differently: Take 50 mg by mouth every 2 (two) hours as needed for migraine.) 10 tablet 1   Vitamin D, Ergocalciferol, (DRISDOL) 1.25 MG (50000 UNIT) CAPS capsule Take 1 capsule (50,000 Units total) by mouth every 7 (seven) days. 5 capsule 5   ibuprofen (ADVIL) 200 MG tablet Take 800 mg by mouth every 6 (six) hours as needed for cramping.     oxyCODONE (OXY IR/ROXICODONE) 5 MG immediate release tablet Take 1 tablet (5 mg total) by mouth every 6 (six) hours as needed for up to 10 doses. (Patient not taking: Reported on 03/06/2022) 10 tablet 0   methylPREDNISolone (MEDROL DOSEPAK) 4 MG TBPK tablet Take 6 tablets day one, 5 tablets day two, 4 tablets day three, 3 tablets day four, 2 tablets day five,  then 1 tablet day six (Patient not taking: Reported on 03/06/2022) 21 tablet 0   oxyCODONE (OXYCONTIN) 15 mg 12 hr tablet Take 1 tablet (15 mg total) by mouth every 12 (twelve) hours. (Patient not taking: Reported on 03/06/2022) 14 tablet 0   tiZANidine (ZANAFLEX) 4 MG tablet Take 1 tablet (4 mg total) by mouth every 6 (six) hours as needed for muscle spasms. (Patient not taking: Reported on 03/06/2022) 60 tablet 0   No facility-administered medications prior to visit.    Allergies  Allergen Reactions   Latex Swelling    ROS Review of Systems  Constitutional:  Negative for chills and fever.  HENT:  Negative for congestion, sinus pressure, sinus pain and sore throat.   Eyes:  Negative for pain and discharge.  Respiratory:  Negative for cough and shortness of breath.   Cardiovascular:  Positive for leg swelling. Negative for chest pain and palpitations.  Gastrointestinal:  Positive for constipation. Negative for abdominal pain, diarrhea, nausea and vomiting.  Endocrine: Negative for polydipsia and polyuria.  Genitourinary:  Negative for dysuria and hematuria.  Musculoskeletal:  Positive for back pain. Negative for neck pain and neck stiffness.       Right leg pain  Skin:  Negative for rash.  Neurological:  Positive for numbness (LE). Negative for dizziness, speech difficulty, weakness and headaches.  Psychiatric/Behavioral:  Negative for agitation and behavioral problems.       Objective:    Physical Exam Vitals reviewed.  Constitutional:      General: She is not in acute distress.    Appearance: She is not diaphoretic.  HENT:     Head: Normocephalic and atraumatic.     Nose: Nose normal. No congestion.     Mouth/Throat:     Mouth: Mucous membranes are moist.     Pharynx: No posterior oropharyngeal erythema.  Eyes:     General: No scleral icterus.    Extraocular Movements: Extraocular movements intact.  Cardiovascular:  Rate and Rhythm: Normal rate and regular rhythm.      Pulses: Normal pulses.     Heart sounds: Normal heart sounds. No murmur heard. Pulmonary:     Breath sounds: Normal breath sounds. No wheezing or rales.  Musculoskeletal:     Cervical back: Neck supple. No tenderness.     Right lower leg: No edema.     Left lower leg: No edema.     Comments: ROM of the lumbar spine limited due to pain, no point tenderness in the lumbar area  Skin:    General: Skin is warm.     Findings: No rash.     Comments: Incision site C/D/I  Neurological:     General: No focal deficit present.     Mental Status: She is alert and oriented to person, place, and time.  Psychiatric:        Mood and Affect: Mood normal.        Behavior: Behavior normal.     BP 118/82 (BP Location: Right Arm, Patient Position: Sitting, Cuff Size: Normal)   Pulse 84   Resp 18   Ht '5\' 7"'$  (1.702 m)   Wt 199 lb 12.8 oz (90.6 kg)   LMP 01/03/2022 (Exact Date) Comment: Negative u-preg in ED 02/17/22. BTF  SpO2 98%   BMI 31.29 kg/m  Wt Readings from Last 3 Encounters:  03/06/22 199 lb 12.8 oz (90.6 kg)  02/19/22 199 lb (90.3 kg)  02/17/22 198 lb (89.8 kg)    Lab Results  Component Value Date   TSH 1.300 07/03/2020   Lab Results  Component Value Date   WBC 8.6 02/19/2022   HGB 12.7 02/19/2022   HCT 38.6 02/19/2022   MCV 86.9 02/19/2022   PLT 219 02/19/2022   Lab Results  Component Value Date   NA 138 02/17/2022   K 4.0 02/17/2022   CO2 26 02/17/2022   GLUCOSE 96 02/17/2022   BUN 17 02/17/2022   CREATININE 1.11 (H) 02/19/2022   BILITOT 0.6 01/29/2022   ALKPHOS 55 01/29/2022   AST 13 (L) 01/29/2022   ALT 12 01/29/2022   PROT 7.7 01/29/2022   ALBUMIN 3.9 01/29/2022   CALCIUM 9.1 02/17/2022   ANIONGAP 7 02/17/2022   Lab Results  Component Value Date   CHOL 223 (H) 07/03/2020   Lab Results  Component Value Date   HDL 51 07/03/2020   Lab Results  Component Value Date   LDLCALC 158 (H) 07/03/2020   Lab Results  Component Value Date   TRIG 79 07/03/2020    Lab Results  Component Value Date   CHOLHDL 4.4 07/03/2020   No results found for: "HGBA1C"    Assessment & Plan:   Problem List Items Addressed This Visit       Musculoskeletal and Integument   HNP (herniated nucleus pulposus), lumbar - Primary    S/p lumbar laminecomy with microdiscectomy in 10/22 S/p L5-S1 re-do microdiscectomy in 06/23 Work restrictions discussed F/u with Spine surgeon for back pain and RLE numbness and weakness Refilled naproxen and tizanidine Oxycodone as needed for severe pain      Relevant Medications   tiZANidine (ZANAFLEX) 4 MG tablet   naproxen (NAPROSYN) 500 MG tablet     Other   Anxiety and depression    Uncontrolled currently Restart Lexapro 5 mg QD Low risk for serotonin syndrome with Naproxen      Relevant Medications   escitalopram (LEXAPRO) 10 MG tablet   Hospital discharge follow-up  Hospital chart reviewed including discharge summary S/p re-do microdiscectomy Incision site C/D/I       Meds ordered this encounter  Medications   tiZANidine (ZANAFLEX) 4 MG tablet    Sig: Take 1 tablet (4 mg total) by mouth every 8 (eight) hours as needed for muscle spasms.    Dispense:  60 tablet    Refill:  0   naproxen (NAPROSYN) 500 MG tablet    Sig: Take 1 tablet (500 mg total) by mouth 2 (two) times daily with a meal.    Dispense:  60 tablet    Refill:  0   escitalopram (LEXAPRO) 10 MG tablet    Sig: Take 0.5 tablets (5 mg total) by mouth daily.    Dispense:  90 tablet    Refill:  1    Follow-up: Return in about 4 months (around 07/07/2022) for Annual physical.    Lindell Spar, MD

## 2022-03-12 ENCOUNTER — Ambulatory Visit: Payer: Medicaid Other | Admitting: Internal Medicine

## 2022-03-21 ENCOUNTER — Other Ambulatory Visit: Payer: Self-pay | Admitting: *Deleted

## 2022-03-26 ENCOUNTER — Other Ambulatory Visit: Payer: Self-pay | Admitting: Internal Medicine

## 2022-03-26 DIAGNOSIS — M5126 Other intervertebral disc displacement, lumbar region: Secondary | ICD-10-CM

## 2022-04-01 ENCOUNTER — Encounter: Payer: Self-pay | Admitting: Internal Medicine

## 2022-04-01 ENCOUNTER — Ambulatory Visit: Payer: Medicaid Other | Admitting: Internal Medicine

## 2022-04-01 VITALS — BP 136/84 | HR 96 | Resp 16 | Ht 67.0 in | Wt 201.0 lb

## 2022-04-01 DIAGNOSIS — F419 Anxiety disorder, unspecified: Secondary | ICD-10-CM

## 2022-04-01 DIAGNOSIS — F32A Depression, unspecified: Secondary | ICD-10-CM | POA: Diagnosis not present

## 2022-04-01 DIAGNOSIS — H00011 Hordeolum externum right upper eyelid: Secondary | ICD-10-CM

## 2022-04-01 MED ORDER — ERYTHROMYCIN 5 MG/GM OP OINT: 1.0000 | TOPICAL_OINTMENT | Freq: Four times a day (QID) | OPHTHALMIC | 0 refills | Status: DC

## 2022-04-01 NOTE — Assessment & Plan Note (Signed)
Better controlled currently On Lexapro 5 mg QD

## 2022-04-01 NOTE — Patient Instructions (Signed)
Please apply erythromycin solution over right upper eyelid as prescribed.  Please apply warm compresses to help with swelling.

## 2022-04-01 NOTE — Assessment & Plan Note (Signed)
Right upper eyelid swelling Vision intact currently Advised to apply warm compresses Started erythromycin ophthalmic ointment

## 2022-04-01 NOTE — Progress Notes (Signed)
Acute Office Visit  Subjective:    Patient ID: Kaylee Miller, female    DOB: 26-Apr-1991, 31 y.o.   MRN: [id number]  Chief Complaint  Patient presents with   Facial Swelling    Patient right eye is swollen has been that way since 03-26-22 red and started hurting     HPI Patient is in today for c/o right upper eyelid swelling for the last 1 week.  She denies any recent injury or insect bite.  She had mild swelling of right upper eyelid, followed by whitish mole and has worsening of right upper eyelid swelling with crusty discharge from right eye.  Denies any redness or visual disturbance.  She feels scratchiness in her right eye currently.  Denies any fever or chills currently.  She has started taking Lexapro for GAD, and feels better now.  Denies any anhedonia, SI or HI currently.  Past Medical History:  Diagnosis Date   Anemia    Anxiety    Asthma    Phreesia 07/02/2020   Contraceptive management 02/08/2015   COVID    mild case   CSF leak 01/29/2022   Depression    GERD (gastroesophageal reflux disease)    Headache(784.0)    migraines   History of bronchitis    HSV-2 (herpes simplex virus 2) infection    Pneumonia    Pregnant    S/P C-section 07/28/2013   Sciatic nerve pain    Seasonal allergies    Vaginal Pap smear, abnormal     Past Surgical History:  Procedure Laterality Date   adenoids     BACK SURGERY     CESAREAN SECTION  08/25/2005   CESAREAN SECTION N/A 07/28/2013   Procedure: CESAREAN SECTION;  Surgeon: Jonnie Kind, MD;  Location: [facility];  Service: Obstetrics;  Laterality: N/A;   LUMBAR LAMINECTOMY/DECOMPRESSION MICRODISCECTOMY Right 05/25/2021   Procedure: DISCECTOMY Lumbar five - Sacral one;  Surgeon: Ashok Pall, MD;  Location: [place];  Service: Neurosurgery;  Laterality: Right;   LUMBAR LAMINECTOMY/DECOMPRESSION MICRODISCECTOMY Right 01/15/2022   Procedure: Right Lumbar Five-Sacral One Microdiscectomy;  Surgeon: Ashok Pall, MD;  Location: [place];  Service: Neurosurgery;  Laterality: Right;  3C/RM 19   LUMBAR LAMINECTOMY/DECOMPRESSION MICRODISCECTOMY N/A 01/30/2022   Procedure: REVISION L5-S1 LAMINECTOMY REPAIR CSS LEAK;  Surgeon: Dawley, Theodoro Doing, DO;  Location: [place];  Service: Neurosurgery;  Laterality: N/A;   LUMBAR LAMINECTOMY/DECOMPRESSION MICRODISCECTOMY N/A 02/19/2022   Procedure: Redo Lumbar five-Sacral one Microdiscectomy;  Surgeon: Ashok Pall, MD;  Location: [place];  Service: Neurosurgery;  Laterality: N/A;   tubes in ears      Family History  Problem Relation Age of Onset   Cancer Mother    Breast cancer Mother    Hypertension Father    Stroke Father    CAD Maternal Grandfather    CAD Paternal Grandmother    CAD Paternal Grandfather    Diabetes Other        great-great grandmother   CAD Other     Social History   Socioeconomic History   Marital status: Single    Spouse name: Not on file   Number of children: Not on file   Years of education: Not on file   Highest education level: Not on file  Occupational History   Not on file  Tobacco Use   Smoking status: Every Day    Packs/day: 0.50    Years: 9.00    Total pack years: 4.50    Types: Cigarettes  Passive exposure: Never   Smokeless tobacco: Never  Vaping Use   Vaping Use: Never used  Substance and Sexual Activity   Alcohol use: Yes    Comment: Occa   Drug use: Never   Sexual activity: Not Currently    Birth control/protection: Injection    Comment: Depo Inj 12/2021  Other Topics Concern   Not on file  Social History Narrative   Not on file   Social Determinants of Health   Financial Resource Strain: Medium Risk (09/18/2020)   Overall Financial Resource Strain (CARDIA)    Difficulty of Paying Living Expenses: Somewhat hard  Food Insecurity: Food Insecurity Present (09/18/2020)   Hunger Vital Sign    Worried About Running Out of Food in the Last Year: Sometimes true    Ran Out of Food in the Last Year: Never true  Transportation Needs:  No Transportation Needs (09/18/2020)   PRAPARE - Hydrologist (Medical): No    Lack of Transportation (Non-Medical): No  Physical Activity: Sufficiently Active (09/18/2020)   Exercise Vital Sign    Days of Exercise per Week: 5 days    Minutes of Exercise per Session: 100 min  Stress: No Stress Concern Present (09/18/2020)   [place]    Feeling of Stress : Only a little  Social Connections: Moderately Integrated (09/18/2020)   Social Connection and Isolation Panel [NHANES]    Frequency of Communication with Friends and Family: More than three times a week    Frequency of Social Gatherings with Friends and Family: Twice a week    Attends Religious Services: More than 4 times per year    Active Member of [employer] or Organizations: Yes    Attends Archivist Meetings: 1 to 4 times per year    Marital Status: Never married  Intimate Partner Violence: Unknown (09/18/2020)   Humiliation, Afraid, Rape, and Kick questionnaire    Fear of Current or Ex-Partner: No    Emotionally Abused: Patient refused    Physically Abused: No    Sexually Abused: No    Outpatient Medications Prior to Visit  Medication Sig Dispense Refill   albuterol (VENTOLIN HFA) 108 (90 Base) MCG/ACT inhaler Inhale 2 puffs into the lungs every 6 (six) hours as needed for wheezing or shortness of breath. 6.7 g 0   aspirin-acetaminophen-caffeine (EXCEDRIN MIGRAINE) 250-250-65 MG tablet Take 2 tablets by mouth every 6 (six) hours as needed for migraine.     docusate sodium (COLACE) 100 MG capsule Take 200 mg by mouth daily.     escitalopram (LEXAPRO) 10 MG tablet Take 0.5 tablets (5 mg total) by mouth daily. 90 tablet 1   fluticasone (FLONASE) 50 MCG/ACT nasal spray Place 2 sprays into both nostrils daily as needed for allergies or rhinitis.     gabapentin (NEURONTIN) 100 MG capsule Take 1 capsule (100 mg total) by mouth 3 (three)  times daily. 30 capsule 0   meclizine (ANTIVERT) 25 MG tablet Take 1 tablet (25 mg total) by mouth 3 (three) times daily as needed for dizziness. 30 tablet 0   medroxyPROGESTERone Acetate 150 MG/ML SUSY Inject 1 mL (150 mg total) into the muscle every 3 (three) months. INJECT 1 ML INTO THE MUSCLE EVERY 3 MONTHS Strength: 150 mg/mL 1 mL 4   Melatonin 5 MG CAPS Take 5 mg by mouth at bedtime as needed (sleep).     naproxen (NAPROSYN) 500 MG tablet TAKE 1 TABLET BY  MOUTH TWICE DAILY WITH A MEAL 60 tablet 0   ondansetron (ZOFRAN ODT) 4 MG disintegrating tablet Take 1 tablet (4 mg total) by mouth every 8 (eight) hours as needed for nausea or vomiting. 20 tablet 0   SUMAtriptan (IMITREX) 50 MG tablet TAKE 1 TABLET BY MOUTH EVERY 2 HOURS AS NEEDED FOR MIGRAINE. MAY REPEAT IN 2 HOURS IF HEADACHE PERSISTS OR RECURS. MAXIMUM OF 2 DOSES. (Patient taking differently: Take 50 mg by mouth every 2 (two) hours as needed for migraine.) 10 tablet 1   tiZANidine (ZANAFLEX) 4 MG tablet Take 1 tablet (4 mg total) by mouth every 8 (eight) hours as needed for muscle spasms. 60 tablet 0   Vitamin D, Ergocalciferol, (DRISDOL) 1.25 MG (50000 UNIT) CAPS capsule Take 1 capsule (50,000 Units total) by mouth every 7 (seven) days. 5 capsule 5   oxyCODONE (OXY IR/ROXICODONE) 5 MG immediate release tablet Take 1 tablet (5 mg total) by mouth every 6 (six) hours as needed for up to 10 doses. (Patient not taking: Reported on 03/06/2022) 10 tablet 0   No facility-administered medications prior to visit.    Allergies  Allergen Reactions   Latex Swelling    Review of Systems  Constitutional:  Negative for chills and fever.  HENT:  Negative for congestion, sinus pressure, sinus pain and sore throat.   Eyes:  Positive for pain and discharge. Negative for visual disturbance.       Eyelid swelling  Respiratory:  Negative for cough and shortness of breath.   Cardiovascular:  Positive for leg swelling. Negative for chest pain and  palpitations.  Gastrointestinal:  Positive for constipation. Negative for abdominal pain, diarrhea, nausea and vomiting.  Endocrine: Negative for polydipsia and polyuria.  Genitourinary:  Negative for dysuria and hematuria.  Musculoskeletal:  Positive for back pain. Negative for neck pain and neck stiffness.       Right leg pain  Skin:  Negative for rash.  Neurological:  Negative for dizziness, speech difficulty, weakness and headaches.  Psychiatric/Behavioral:  Negative for agitation and behavioral problems.        Objective:    Physical Exam Vitals reviewed.  Constitutional:      General: She is not in acute distress.    Appearance: She is not diaphoretic.  HENT:     Head: Normocephalic and atraumatic.     Nose: Nose normal. No congestion.     Mouth/Throat:     Mouth: Mucous membranes are moist.     Pharynx: No posterior oropharyngeal erythema.  Eyes:     General: Vision grossly intact. No scleral icterus.       Right eye: Hordeolum present.     Extraocular Movements: Extraocular movements intact.  Cardiovascular:     Rate and Rhythm: Normal rate and regular rhythm.     Pulses: Normal pulses.     Heart sounds: Normal heart sounds. No murmur heard. Pulmonary:     Breath sounds: Normal breath sounds. No wheezing or rales.  Musculoskeletal:     Cervical back: Neck supple. No tenderness.     Right lower leg: No edema.     Left lower leg: No edema.     Comments: ROM of the lumbar spine limited due to pain, no point tenderness in the lumbar area  Skin:    General: Skin is warm.     Findings: No rash.     Comments: Incision site C/D/I  Neurological:     General: No focal deficit present.  Mental Status: She is alert and oriented to person, place, and time.  Psychiatric:        Mood and Affect: Mood normal.        Behavior: Behavior normal.     BP 136/84 (BP Location: Right Arm, Patient Position: Sitting, Cuff Size: Normal)   Pulse 96   Resp 16   Ht '5\' 7"'$  (1.702  m)   Wt 201 lb (91.2 kg)   SpO2 97%   BMI 31.48 kg/m  Wt Readings from Last 3 Encounters:  04/01/22 201 lb (91.2 kg)  03/06/22 199 lb 12.8 oz (90.6 kg)  02/19/22 199 lb (90.3 kg)        Assessment & Plan:   Problem List Items Addressed This Visit       Other   Anxiety and depression    Better controlled currently On Lexapro 5 mg QD      Hordeolum externum of right upper eyelid - Primary    Right upper eyelid swelling Vision intact currently Advised to apply warm compresses Started erythromycin ophthalmic ointment      Relevant Medications   erythromycin ophthalmic ointment     Meds ordered this encounter  Medications   erythromycin ophthalmic ointment    Sig: Place 1 Application into the right eye 4 (four) times daily.    Dispense:  3.5 g    Refill:  0     Airiana Elman Keith Rake, MD

## 2022-04-15 ENCOUNTER — Other Ambulatory Visit: Payer: Self-pay | Admitting: Adult Health

## 2022-04-15 ENCOUNTER — Ambulatory Visit: Payer: Medicaid Other | Admitting: Adult Health

## 2022-04-22 ENCOUNTER — Other Ambulatory Visit: Payer: Self-pay | Admitting: Women's Health

## 2022-04-23 ENCOUNTER — Ambulatory Visit (INDEPENDENT_AMBULATORY_CARE_PROVIDER_SITE_OTHER): Payer: Medicaid Other | Admitting: Adult Health

## 2022-04-23 ENCOUNTER — Other Ambulatory Visit: Payer: Self-pay | Admitting: Adult Health

## 2022-04-23 ENCOUNTER — Encounter: Payer: Self-pay | Admitting: Adult Health

## 2022-04-23 VITALS — BP 110/72 | HR 84 | Ht 67.0 in | Wt 208.0 lb

## 2022-04-23 DIAGNOSIS — Z3042 Encounter for surveillance of injectable contraceptive: Secondary | ICD-10-CM

## 2022-04-23 DIAGNOSIS — F419 Anxiety disorder, unspecified: Secondary | ICD-10-CM | POA: Diagnosis not present

## 2022-04-23 DIAGNOSIS — F32A Depression, unspecified: Secondary | ICD-10-CM

## 2022-04-23 MED ORDER — MEDROXYPROGESTERONE ACETATE 150 MG/ML IM SUSP
150.0000 mg | Freq: Once | INTRAMUSCULAR | Status: AC
  Administered 2022-04-23: 150 mg via INTRAMUSCULAR

## 2022-04-23 MED ORDER — MEDROXYPROGESTERONE ACETATE 150 MG/ML IM SUSY
150.0000 mg | PREFILLED_SYRINGE | INTRAMUSCULAR | 4 refills | Status: DC
Start: 2022-04-23 — End: 2023-06-29

## 2022-04-23 MED ORDER — ONDANSETRON 4 MG PO TBDP: 4.0000 mg | ORAL_TABLET | Freq: Three times a day (TID) | ORAL | 0 refills | Status: DC | PRN

## 2022-04-23 NOTE — Progress Notes (Signed)
  Subjective:     Patient ID: Kaylee Miller, female   DOB: 1991/06/18, 31 y.o.   MRN: [id number]  HPI Kaylee Miller is a 31 year old black female,single, G2P2 in for follow up on lexapro and doing good, and needs depo today. She has had another back surgery. 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 Her back hurts today.  PCP is Dr Posey Pronto.  Last pap 09/18/20 Component 1 yr ago  High risk HPV Negative   Neisseria Gonorrhea Negative   Chlamydia Negative   Adequacy Satisfactory for evaluation; transformation zone component ABSENT.   Diagnosis - Atypical squamous cells of undetermined significance (ASC-US) Abnormal    Microorganisms Fungal organisms present consistent with Candida spp.    Review of Systems Doing good on lexapro Has back pain today Has nausea at times meds  Reviewed past medical,surgical, social and family history. Reviewed medications and allergies.     Objective:   Physical Exam BP 110/72 (BP Location: Right Arm, Patient Position: Sitting, Cuff Size: Large)   Pulse 84   Ht '5\' 7"'$  (1.702 m)   Wt 208 lb (94.3 kg)   BMI 32.58 kg/m     Skin warm and dry.. Lungs: clear to ausculation bilaterally. Cardiovascular: regular rate and rhythm.  She received depo I'm in office today.  Fall risk is moderate    04/23/2022    4:08 PM 04/01/2022   10:31 AM 03/06/2022    8:58 AM  Depression screen PHQ 2/9  Decreased Interest 1 0 0  Down, Depressed, Hopeless 2 0 0  PHQ - 2 Score 3 0 0  Altered sleeping 3    Tired, decreased energy 1    Change in appetite 1    Feeling bad or failure about yourself  0    Trouble concentrating 1    Moving slowly or fidgety/restless 3    Suicidal thoughts 0    PHQ-9 Score 12         04/23/2022    4:10 PM 01/21/2022   10:26 AM 10/29/2021    9:34 AM 09/18/2020    9:54 AM  GAD 7 : Generalized Anxiety Score  Nervous, Anxious, on Edge '1 1 1 '$ 0  Control/stop worrying 1 0 1 0  Worry too much - different things '1 1 3 2  '$ Trouble relaxing '1 1 1 2   '$ Restless 1 0 3 0  Easily annoyed or irritable 0 '1 3 2  '$ Afraid - awful might happen 0 0 1 0  Total GAD 7 Score '5 4 13 6    '$ Upstream - 04/23/22 1611       Pregnancy Intention Screening   Does the patient want to become pregnant in the next year? No    Does the patient's partner want to become pregnant in the next year? No    Would the patient like to discuss contraceptive options today? No      Contraception Wrap Up   Current Method Hormonal Injection    End Method Hormonal Injection    Contraception Counseling Provided No               Assessment:     1. Depo-Provera contraceptive status She received depo today in office Depo in 12 weeks   2. Anxiety and depression Continue lexapro 10 mg 1 daily, has refills     Plan:     Depo in 12 weeks Pap and physical in January after 09/18/22

## 2022-05-09 ENCOUNTER — Telehealth: Payer: Self-pay | Admitting: Internal Medicine

## 2022-05-09 ENCOUNTER — Ambulatory Visit: Payer: Medicaid Other | Admitting: Internal Medicine

## 2022-05-09 ENCOUNTER — Encounter: Payer: Self-pay | Admitting: Internal Medicine

## 2022-05-09 ENCOUNTER — Telehealth: Payer: Self-pay

## 2022-05-09 VITALS — BP 128/86 | HR 75 | Resp 18 | Ht 67.0 in | Wt 211.0 lb

## 2022-05-09 DIAGNOSIS — Z23 Encounter for immunization: Secondary | ICD-10-CM

## 2022-05-09 DIAGNOSIS — M48062 Spinal stenosis, lumbar region with neurogenic claudication: Secondary | ICD-10-CM | POA: Diagnosis not present

## 2022-05-09 DIAGNOSIS — M5126 Other intervertebral disc displacement, lumbar region: Secondary | ICD-10-CM | POA: Diagnosis not present

## 2022-05-09 MED ORDER — METHYLPREDNISOLONE ACETATE 80 MG/ML IJ SUSP
80.0000 mg | Freq: Once | INTRAMUSCULAR | Status: AC
  Administered 2022-05-09: 80 mg via INTRAMUSCULAR

## 2022-05-09 MED ORDER — PREDNISONE 20 MG PO TABS: 40.0000 mg | ORAL_TABLET | Freq: Every day | ORAL | 0 refills | Status: DC

## 2022-05-09 MED ORDER — TIZANIDINE HCL 4 MG PO TABS: 4.0000 mg | ORAL_TABLET | Freq: Three times a day (TID) | ORAL | 0 refills | Status: DC | PRN

## 2022-05-09 MED ORDER — KETOROLAC TROMETHAMINE 60 MG/2ML IM SOLN
60.0000 mg | Freq: Once | INTRAMUSCULAR | Status: AC
  Administered 2022-05-09: 60 mg via INTRAMUSCULAR

## 2022-05-09 NOTE — Patient Instructions (Signed)
Please take Prednisone as prescribed.  Please continue to take Tizanidine as needed for muscle spasms.  Please avoid heavy lifting and frequent bending.

## 2022-05-09 NOTE — Assessment & Plan Note (Signed)
MRI of the lumbar spine reviewed Follows with Spine specialist Avoid heavy lifting Depo-Medrol and Toradol given in the office, followed by oral prednisone

## 2022-05-09 NOTE — Telephone Encounter (Signed)
Pt called stating that the dates on her oow note were wrong. She needs this changed to [facility] 05/09/22-05/13/22. She also needs the weight limitation in a separate letter. Pt worked 05/08/22 she was not seen till today. Can you please correct this?

## 2022-05-09 NOTE — Telephone Encounter (Signed)
Patient called said the note given today has the incorrect date.  Patient worked on the 09.14.2023. Noted needed to excused 09.15.2023 through 09.16.2023.  call patient [phone].

## 2022-05-09 NOTE — Assessment & Plan Note (Addendum)
S/p lumbar laminecomy with microdiscectomy in 10/22 S/p L5-S1 re-do microdiscectomy in 06/23 F/u with Spine surgeon Refilled tizanidine Depo-Medrol IM today Prednisone 40 mg X 5 days Avoid heavy lifting and frequent bending Work restrictions discussed, work note provided

## 2022-05-09 NOTE — Progress Notes (Signed)
Acute Office Visit  Subjective:    Patient ID: Kaylee Miller, female    DOB: 04/07/1991, 31 y.o.   MRN: [id number]  Chief Complaint  Patient presents with   Back Pain    Lower back pain and pain in buttocks started 05-08-22    HPI Patient is in today for complaint of left-sided low back pain, radiating to left buttock and thigh area.  She has history of lumbar disc displacement S/p lumbar laminecomy with microdiscectomy in 10/22 S/p L5-S1 re-do microdiscectomy in 06/23.  She reports that she had to lift about 35 pounds pallets of weight at her workplace yesterday and move them around as she had to work in a different department.  She has constant, sharp and 8-10/10, left-sided low back pain since then.  She denies any new numbness or tingling of the LE.  She has tried taking naproxen and gabapentin for it.  Past Medical History:  Diagnosis Date   Anemia    Anxiety    Asthma    [employer]reesia 07/02/2020   Contraceptive management 02/08/2015   COVID    mild case   CSF leak 01/29/2022   Depression    GERD (gastroesophageal reflux disease)    Headache(784.0)    migraines   History of bronchitis    HSV-2 (herpes simplex virus 2) infection    Pneumonia    Pregnant    S/P C-section 07/28/2013   Sciatic nerve pain    Seasonal allergies    Vaginal Pap smear, abnormal     Past Surgical History:  Procedure Laterality Date   adenoids     BACK SURGERY     CESAREAN SECTION  08/25/2005   CESAREAN SECTION N/A 07/28/2013   Procedure: CESAREAN SECTION;  Surgeon: Jonnie Kind, MD;  Location: [facility];  Service: Obstetrics;  Laterality: N/A;   LUMBAR LAMINECTOMY/DECOMPRESSION MICRODISCECTOMY Right 05/25/2021   Procedure: DISCECTOMY Lumbar five - Sacral one;  Surgeon: Ashok Pall, MD;  Location: [place];  Service: Neurosurgery;  Laterality: Right;   LUMBAR LAMINECTOMY/DECOMPRESSION MICRODISCECTOMY Right 01/15/2022   Procedure: Right Lumbar Five-Sacral One Microdiscectomy;  Surgeon:  Ashok Pall, MD;  Location: [place];  Service: Neurosurgery;  Laterality: Right;  3C/RM 19   LUMBAR LAMINECTOMY/DECOMPRESSION MICRODISCECTOMY N/A 01/30/2022   Procedure: REVISION L5-S1 LAMINECTOMY REPAIR CSS LEAK;  Surgeon: Dawley, Theodoro Doing, DO;  Location: [place];  Service: Neurosurgery;  Laterality: N/A;   LUMBAR LAMINECTOMY/DECOMPRESSION MICRODISCECTOMY N/A 02/19/2022   Procedure: Redo Lumbar five-Sacral one Microdiscectomy;  Surgeon: Ashok Pall, MD;  Location: [place];  Service: Neurosurgery;  Laterality: N/A;   tubes in ears      Family History  Problem Relation Age of Onset   Cancer Mother    Breast cancer Mother    Hypertension Father    Stroke Father    CAD Maternal Grandfather    CAD Paternal Grandmother    CAD Paternal Grandfather    Diabetes Other        great-great grandmother   CAD Other     Social History   Socioeconomic History   Marital status: Single    Spouse name: Not on file   Number of children: Not on file   Years of education: Not on file   Highest education level: Not on file  Occupational History   Not on file  Tobacco Use   Smoking status: Every Day    Packs/day: 0.50    Years: 9.00    Total pack years: 4.50  Types: Cigarettes    Passive exposure: Never   Smokeless tobacco: Never  Vaping Use   Vaping Use: Never used  Substance and Sexual Activity   Alcohol use: Yes    Comment: Occa   Drug use: Never   Sexual activity: Yes    Birth control/protection: Injection  Other Topics Concern   Not on file  Social History Narrative   Not on file   Social Determinants of Health   Financial Resource Strain: Medium Risk (09/18/2020)   Overall Financial Resource Strain (CARDIA)    Difficulty of Paying Living Expenses: Somewhat hard  Food Insecurity: Food Insecurity Present (09/18/2020)   Hunger Vital Sign    Worried About Running Out of Food in the Last Year: Sometimes true    Ran Out of Food in the Last Year: Never true  Transportation Needs: No  Transportation Needs (09/18/2020)   PRAPARE - Hydrologist (Medical): No    Lack of Transportation (Non-Medical): No  Physical Activity: Sufficiently Active (09/18/2020)   Exercise Vital Sign    Days of Exercise per Week: 5 days    Minutes of Exercise per Session: 100 min  Stress: No Stress Concern Present (09/18/2020)   [place]    Feeling of Stress : Only a little  Social Connections: Moderately Integrated (09/18/2020)   Social Connection and Isolation Panel [NHANES]    Frequency of Communication with Friends and Family: More than three times a week    Frequency of Social Gatherings with Friends and Family: Twice a week    Attends Religious Services: More than 4 times per year    Active Member of [employer] or Organizations: Yes    Attends Archivist Meetings: 1 to 4 times per year    Marital Status: Never married  Intimate Partner Violence: Unknown (09/18/2020)   Humiliation, Afraid, Rape, and Kick questionnaire    Fear of Current or Ex-Partner: No    Emotionally Abused: Patient refused    Physically Abused: No    Sexually Abused: No    Outpatient Medications Prior to Visit  Medication Sig Dispense Refill   albuterol (VENTOLIN HFA) 108 (90 Base) MCG/ACT inhaler Inhale 2 puffs into the lungs every 6 (six) hours as needed for wheezing or shortness of breath. 6.7 g 0   aspirin-acetaminophen-caffeine (EXCEDRIN MIGRAINE) 250-250-65 MG tablet Take 2 tablets by mouth every 6 (six) hours as needed for migraine.     docusate sodium (COLACE) 100 MG capsule Take 200 mg by mouth daily.     escitalopram (LEXAPRO) 10 MG tablet Take 0.5 tablets (5 mg total) by mouth daily. 90 tablet 1   fluticasone (FLONASE) 50 MCG/ACT nasal spray Place 2 sprays into both nostrils daily as needed for allergies or rhinitis.     gabapentin (NEURONTIN) 100 MG capsule Take 1 capsule (100 mg total) by mouth 3 (three)  times daily. 30 capsule 0   medroxyPROGESTERone Acetate 150 MG/ML SUSY Inject 1 mL (150 mg total) into the muscle every 3 (three) months. INJECT 1 ML INTO THE MUSCLE EVERY 3 MONTHS Strength: 150 mg/mL 1 mL 4   Melatonin 5 MG CAPS Take 5 mg by mouth at bedtime as needed (sleep).     naproxen (NAPROSYN) 500 MG tablet TAKE 1 TABLET BY MOUTH TWICE DAILY WITH A MEAL 60 tablet 0   ondansetron (ZOFRAN ODT) 4 MG disintegrating tablet Take 1 tablet (4 mg total) by mouth every 8 (eight)  hours as needed for nausea or vomiting. 20 tablet 0   Vitamin D, Ergocalciferol, (DRISDOL) 1.25 MG (50000 UNIT) CAPS capsule Take 1 capsule (50,000 Units total) by mouth every 7 (seven) days. 5 capsule 5   tiZANidine (ZANAFLEX) 4 MG tablet Take 1 tablet (4 mg total) by mouth every 8 (eight) hours as needed for muscle spasms. 60 tablet 0   No facility-administered medications prior to visit.    Allergies  Allergen Reactions   Latex Swelling    Review of Systems  Constitutional:  Negative for chills and fever.  HENT:  Negative for congestion, sinus pressure, sinus pain and sore throat.   Eyes:  Negative for pain and discharge.  Respiratory:  Negative for cough and shortness of breath.   Cardiovascular:  Positive for leg swelling. Negative for chest pain and palpitations.  Gastrointestinal:  Positive for constipation. Negative for abdominal pain, diarrhea, nausea and vomiting.  Endocrine: Negative for polydipsia and polyuria.  Genitourinary:  Negative for dysuria and hematuria.  Musculoskeletal:  Positive for back pain. Negative for neck pain and neck stiffness.  Skin:  Negative for rash.  Neurological:  Negative for dizziness, speech difficulty, weakness and headaches.  Psychiatric/Behavioral:  Negative for agitation and behavioral problems.        Objective:    Physical Exam Vitals reviewed.  Constitutional:      General: She is not in acute distress.    Appearance: She is not diaphoretic.  HENT:      Head: Normocephalic and atraumatic.     Nose: Nose normal. No congestion.     Mouth/Throat:     Mouth: Mucous membranes are moist.     Pharynx: No posterior oropharyngeal erythema.  Eyes:     General: No scleral icterus.    Extraocular Movements: Extraocular movements intact.  Cardiovascular:     Rate and Rhythm: Normal rate and regular rhythm.     Pulses: Normal pulses.     Heart sounds: Normal heart sounds. No murmur heard. Pulmonary:     Breath sounds: Normal breath sounds. No wheezing or rales.  Musculoskeletal:     Cervical back: Neck supple. No tenderness.     Right lower leg: No edema.     Left lower leg: No edema.     Comments: ROM of the lumbar spine limited due to pain, point tenderness in the left lumbar paraspinal area  Skin:    General: Skin is warm.     Findings: No rash.     Comments: Incision site C/D/I  Neurological:     General: No focal deficit present.     Mental Status: She is alert and oriented to person, place, and time.  Psychiatric:        Mood and Affect: Mood normal.        Behavior: Behavior normal.     BP 128/86 (BP Location: Right Arm, Patient Position: Sitting, Cuff Size: Normal)   Pulse 75   Resp 18   Ht '5\' 7"'$  (1.702 m)   Wt 211 lb (95.7 kg)   SpO2 97%   BMI 33.05 kg/m  Wt Readings from Last 3 Encounters:  05/09/22 211 lb (95.7 kg)  04/23/22 208 lb (94.3 kg)  04/01/22 201 lb (91.2 kg)        Assessment & Plan:   Problem List Items Addressed This Visit       Musculoskeletal and Integument   HNP (herniated nucleus pulposus), lumbar    S/p lumbar laminecomy with microdiscectomy in 10/22  S/p L5-S1 re-do microdiscectomy in 06/23 F/u with Spine surgeon Refilled tizanidine Depo-Medrol IM today Prednisone 40 mg X 5 days Avoid heavy lifting and frequent bending Work restrictions discussed, work note provided      Relevant Medications   tiZANidine (ZANAFLEX) 4 MG tablet   predniSONE (DELTASONE) 20 MG tablet     Other   Spinal  stenosis of lumbar region with neurogenic claudication - Primary    MRI of the lumbar spine reviewed Follows with Spine specialist Avoid heavy lifting Depo-Medrol and Toradol given in the office, followed by oral prednisone      Relevant Medications   tiZANidine (ZANAFLEX) 4 MG tablet   predniSONE (DELTASONE) 20 MG tablet   Other Visit Diagnoses     Need for immunization against influenza       Relevant Orders   Flu Vaccine QUAD 27moIM (Fluarix, Fluzone & Alfiuria Quad PF) (Completed)        Meds ordered this encounter  Medications   tiZANidine (ZANAFLEX) 4 MG tablet    Sig: Take 1 tablet (4 mg total) by mouth every 8 (eight) hours as needed for muscle spasms.    Dispense:  30 tablet    Refill:  0   predniSONE (DELTASONE) 20 MG tablet    Sig: Take 2 tablets (40 mg total) by mouth daily with breakfast.    Dispense:  10 tablet    Refill:  0   methylPREDNISolone acetate (DEPO-MEDROL) injection 80 mg   ketorolac (TORADOL) injection 60 mg     RLindell Spar MD

## 2022-05-12 ENCOUNTER — Encounter: Payer: Self-pay | Admitting: *Deleted

## 2022-05-12 NOTE — Telephone Encounter (Signed)
Patient calling back needs note change.

## 2022-05-12 NOTE — Telephone Encounter (Signed)
Note changed should be in [place] available for patient

## 2022-05-20 ENCOUNTER — Ambulatory Visit (HOSPITAL_COMMUNITY): Payer: Medicaid Other | Attending: Neurosurgery | Admitting: Physical Therapy

## 2022-05-20 ENCOUNTER — Encounter (HOSPITAL_COMMUNITY): Payer: Self-pay | Admitting: Physical Therapy

## 2022-05-20 DIAGNOSIS — M6281 Muscle weakness (generalized): Secondary | ICD-10-CM | POA: Insufficient documentation

## 2022-05-20 DIAGNOSIS — M545 Low back pain, unspecified: Secondary | ICD-10-CM | POA: Insufficient documentation

## 2022-05-20 NOTE — Therapy (Signed)
OUTPATIENT PHYSICAL THERAPY THORACOLUMBAR EVALUATION   Patient Name: Kaylee Miller MRN: [id number] DOB:[birth year], 31 y.o., female Today's Date: 05/20/2022   PT End of Session - 05/20/22 1115     Visit Number 1    Number of Visits 12    Date for PT Re-Evaluation 06/30/22    Authorization Type Medicaid [employer]    Authorization Time Period Auth sent for 8 visits "All visits requested at initial evaluation" 27 visits combined PT visits per year.    Progress Note Due on Visit 10    PT Start Time 1347    PT Stop Time 1431    PT Time Calculation (min) 44 min             Past Medical History:  Diagnosis Date   Anemia    Anxiety    Asthma    Phreesia 07/02/2020   Contraceptive management 02/08/2015   COVID    mild case   CSF leak 01/29/2022   Depression    GERD (gastroesophageal reflux disease)    Headache(784.0)    migraines   History of bronchitis    HSV-2 (herpes simplex virus 2) infection    Pneumonia    Pregnant    S/P C-section 07/28/2013   Sciatic nerve pain    Seasonal allergies    Vaginal Pap smear, abnormal    Past Surgical History:  Procedure Laterality Date   adenoids     BACK SURGERY     CESAREAN SECTION  08/25/2005   CESAREAN SECTION N/A 07/28/2013   Procedure: CESAREAN SECTION;  Surgeon: Jonnie Kind, MD;  Location: [facility];  Service: Obstetrics;  Laterality: N/A;   LUMBAR LAMINECTOMY/DECOMPRESSION MICRODISCECTOMY Right 05/25/2021   Procedure: DISCECTOMY Lumbar five - Sacral one;  Surgeon: Ashok Pall, MD;  Location: [facility];  Service: Neurosurgery;  Laterality: Right;   LUMBAR LAMINECTOMY/DECOMPRESSION MICRODISCECTOMY Right 01/15/2022   Procedure: Right Lumbar Five-Sacral One Microdiscectomy;  Surgeon: Ashok Pall, MD;  Location: [facility];  Service: Neurosurgery;  Laterality: Right;  3C/RM 19   LUMBAR LAMINECTOMY/DECOMPRESSION MICRODISCECTOMY N/A 01/30/2022   Procedure: REVISION L5-S1 LAMINECTOMY REPAIR CSS LEAK;  Surgeon: Dawley, Theodoro Doing, DO;   Location: [facility];  Service: Neurosurgery;  Laterality: N/A;   LUMBAR LAMINECTOMY/DECOMPRESSION MICRODISCECTOMY N/A 02/19/2022   Procedure: Redo Lumbar five-Sacral one Microdiscectomy;  Surgeon: Ashok Pall, MD;  Location: [facility];  Service: Neurosurgery;  Laterality: N/A;   tubes in ears     Patient Active Problem List   Diagnosis Date Noted   Depo-Provera contraceptive status 04/23/2022   Hordeolum externum of right upper eyelid 04/01/2022   Hospital discharge follow-up 03/06/2022   HNP (herniated nucleus pulposus), lumbar 01/15/2022   Anxiety and depression 10/29/2021   Leg swelling 09/13/2021   Body mass index (BMI) 30.0-30.9, adult 08/27/2021   Herniated nucleus pulposus, L5-S1, right 05/24/2021   Right hip pain 04/05/2021   Spinal stenosis of lumbar region with neurogenic claudication 12/19/2020   Disc displacement, lumbar 11/12/2020   Essential (primary) hypertension 11/12/2020   ASCUS of cervix with negative high risk HPV 09/21/2020   Mild asthma without complication 38/25/0539   Tobacco abuse 07/03/2020   Migraines 01/05/2013    PCP: Ihor Dow, MD  REFERRING PROVIDER: Ashok Pall, MD   REFERRING DIAG: PT eval/tx for M51.26 disc displacement, lumbar   Rationale for Evaluation and Treatment Rehabilitation  THERAPY DIAG:  Low back pain, unspecified back pain laterality, unspecified chronicity, unspecified whether sciatica present - Plan: PT plan of care cert/re-cert  Muscle weakness (generalized)  ONSET DATE: Most recent surgery 02/19/22 Initial onset of back pain with RLE radiculopathy in Feb of 2022  SUBJECTIVE:                                                                                                                                                                                           SUBJECTIVE STATEMENT: Patient reports back pain with radiculopathy into RLE since Feb of last year. Since then has has several complications and little resolution of  symptoms. 4 surgeries total reported. Most recently L5-S1 discetomy on June 28th of 2023. Saw Dr. Christella Noa last Monday for follow-up. States RLE feels weak and pain is improving but still present. Reports she was working Thursday the 14, pulling down heavy pallets/bundles down from a shelf and developed pain down into her LLE similar to past experience and symptoms of the RLE. Estimates load to be about 45#. The rest of the day she was having to bend over and tear paper from a spool. States Dr. Christella Noa plans to get an MRI.  PERTINENT HISTORY:  surgery: right redo L5/S1 discetomy on 02/19/2022   Multiple lumbar surgeries, including repair of CSF leak. PAIN:  Are you having pain? Yes: NPRS scale: 7/10 Pain location: back bil LE LT>RT not beyond knee Pain description: aches Aggravating factors: standing and sitting Relieving factors: stretching   PRECAUTIONS: Back - No more than 15# lifting  WEIGHT BEARING RESTRICTIONS No  FALLS:  Has patient fallen in last 6 months? No  LIVING ENVIRONMENT: Lives with: lives with their family children (oldest 49) Lives in: House/apartment Stairs: Yes: External: 3 steps; none Has following equipment at home: Single point cane, Walker - 2 wheeled, shower chair, and bed side commode  OCCUPATION: [employer]  PLOF: Independent  PATIENT GOALS relieve pain   OBJECTIVE:   DIAGNOSTIC FINDINGS:  IMPRESSION: Right laminectomy L5-S1. 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   Interval improvement in CSF leak arising at the laminectomy defect L5-S1 on the right extending into the subcutaneous tissues  PATIENT SURVEYS:  Modified Oswestry 19 / 50 = 38.0 %   SCREENING FOR RED FLAGS: Bowel or bladder incontinence: No Spinal tumors: No Cauda equina syndrome: No Compression fracture: No Abdominal aneurysm: No  COGNITION:  Overall cognitive status: Within functional limits  for tasks assessed     SENSATION: WFL   LUMBAR ROM:   Active  A/PROM  eval  Flexion To distal patellae  Extension wnl  Right lateral flexion jointline  Left lateral flexion jointline  Right rotation Limited 15%  Left rotation WNL  No alteration of pain at end ranges  (Blank rows = not tested)  LOWER EXTREMITY ROM:     WFL   LOWER EXTREMITY MMT:    MMT Right eval Left eval  Hip flexion 4 4  Hip extension 4- 4+  Hip abduction 4 4  Hip adduction 4 4  Hip internal rotation    Hip external rotation 4+ 4+  Knee flexion 5 5  Knee extension 4+ 4  Ankle dorsiflexion 5 5  Ankle plantarflexion 6/10 10/10  Ankle inversion    Ankle eversion 5 5   (Blank rows = not tested)    FUNCTIONAL TESTS:  5 times sit to stand: 20.1 sec  GAIT: WNL   TODAY'S TREATMENT  Eval MMT, ROM 5x sit to stand Education HEP   PATIENT EDUCATION:  Education details: Findings, symptom awareness, activity modification, POC, and PT role Person educated: Patient Education method: Explanation Education comprehension: verbalized understanding   HOME EXERCISE PROGRAM: Access Code: [email or web address] URL: [email or web address] Date: 05/20/2022 Prepared by: Candie Mile  Exercises - Prone on Elbows Stretch  - 3 x daily - 7 x weekly - 10 reps - 5 second hold - Supine Transversus Abdominis Bracing - Hands on Stomach  - 3 x daily - 7 x weekly - 10 reps - Supine Figure 4 Piriformis Stretch  - 3 x daily - 7 x weekly - 3 sets - 15 second hold - Supine Piriformis Stretch with Foot on Ground  - 3 x daily - 7 x weekly - 3 sets - 15 second hold  ASSESSMENT:  CLINICAL IMPRESSION: Patient is a 31 y.o. female who was seen today for physical therapy evaluation and treatment for Low back pain. Patient presents with deficits including reduced strength, limited range of motion, limited activity tolerance, and pain, contributing to impaired functional mobility with ADLs and IADLs. Patient is  currently restricted in ADLs as indicated by objective and subjective functional outcome measures, as well as reported history and objective measures taken during this exam. Patient will benefit from skilled physical therapy intervention in order to improve function and reduce the impairments listed above.    OBJECTIVE IMPAIRMENTS decreased activity tolerance, decreased mobility, decreased ROM, decreased strength, hypomobility, impaired flexibility, improper body mechanics, postural dysfunction, obesity, and pain.   ACTIVITY LIMITATIONS carrying, lifting, bending, sitting, standing, toileting, reach over head, and locomotion level  PARTICIPATION LIMITATIONS: cleaning, shopping, community activity, and occupation  PERSONAL FACTORS Past/current experiences and multiple lumbar surgeries  are also affecting patient's functional outcome.   REHAB POTENTIAL: Good  CLINICAL DECISION MAKING: Stable/uncomplicated  EVALUATION COMPLEXITY: Low   GOALS: Goals reviewed with patient? Yes  SHORT TERM GOALS: Target date: 06/10/22  Patient will be independent with initial HEP and self-management strategies to improve functional outcomes Baseline: Initiated Goal status: INITIAL    LONG TERM GOALS: Target date: 07/01/22  Patient will be independent with advanced HEP and self-management strategies to improve functional outcomes Baseline:  Goal status: INITIAL  2.  Patient will improve modified oswetry score by 13 points or greater to indicate improvement in functional outcomes Baseline: 19 / 50 = 38.0 % Goal status: INITIAL  3.  Patient will report reduction of back pain to <3/10 for improved quality of life and ability to perform ADLs  Baseline: 7/10  Goal status: INITIAL  4. Patient will have equal to or > 4+/5 MMT throughout BIL LEs to improve ability to perform functional mobility, stair ambulation and ADLs.  Baseline: See above Goal  status: INITIAL     5. Patient will improve 5x sit to  stand <12 seconds to indicate significant improvement in functional strength.    Baseline: 20.1 sec    Goal status: INITIAL  PLAN: PT FREQUENCY: 2x/week  PT DURATION: 4 weeks  PLANNED INTERVENTIONS: Therapeutic exercises, Therapeutic activity, Neuromuscular re-education, Balance training, Gait training, Patient/Family education, Self Care, Joint mobilization, Joint manipulation, Stair training, DME instructions, Dry Needling, Electrical stimulation, Spinal manipulation, Spinal mobilization, Cryotherapy, Moist heat, scar mobilization, Taping, Traction, Ultrasound, Ionotophoresis '4mg'$ /ml Dexamethasone, Manual therapy, and Re-evaluation.  PLAN FOR NEXT SESSION: Extension preference for centralization of symptoms; Core exercises, LE strengthening, gradual restoration of lumbar ROM. Lifting mechanics for work demands.   Candie Mile, PT, DPT Physical Therapist [facility]  05/20/2022, 3:00 PM

## 2022-05-29 ENCOUNTER — Telehealth: Payer: Self-pay

## 2022-05-29 NOTE — Telephone Encounter (Signed)
Pt was put on Lexapro. When she started med, it took the edge off. Now, when pt is at work, her anxiety is terrible. Pt states her emotions are up and down. Pt takes a whole tablet. Pt don't know if med needs to be increased or if she needs something different. She has crying spells at times also. Please advise. Thanks! [place]

## 2022-05-29 NOTE — Telephone Encounter (Signed)
Patient called and would like for a nurse to call her concerning her anxiety medication.

## 2022-06-02 ENCOUNTER — Telehealth (HOSPITAL_COMMUNITY): Payer: Self-pay | Admitting: Physical Therapy

## 2022-06-02 ENCOUNTER — Encounter (HOSPITAL_COMMUNITY): Payer: Medicaid Other | Admitting: Physical Therapy

## 2022-06-02 NOTE — Telephone Encounter (Signed)
Left MessageCommunicated - No show #1 - left VM; next appt This thurs at 4pm - please call if unable to keep scheduled appointment.  Candie Mile, PT, DPT Physical Therapist [facility]

## 2022-06-03 ENCOUNTER — Ambulatory Visit: Payer: Medicaid Other | Admitting: Adult Health

## 2022-06-03 ENCOUNTER — Encounter: Payer: Self-pay | Admitting: Adult Health

## 2022-06-03 VITALS — BP 116/78 | HR 74 | Ht 67.0 in | Wt 208.0 lb

## 2022-06-03 DIAGNOSIS — F419 Anxiety disorder, unspecified: Secondary | ICD-10-CM | POA: Diagnosis not present

## 2022-06-03 DIAGNOSIS — F32A Depression, unspecified: Secondary | ICD-10-CM | POA: Diagnosis not present

## 2022-06-03 MED ORDER — BUSPIRONE HCL 5 MG PO TABS: 5.0000 mg | ORAL_TABLET | Freq: Two times a day (BID) | ORAL | 4 refills | Status: DC

## 2022-06-03 NOTE — Progress Notes (Signed)
  Subjective:     Patient ID: Kaylee Miller, female   DOB: 02-21-1991, 31 y.o.   MRN: [id number]  HPI Kaylee Miller is a 31 year old black female,single, G2P2 in complaining of being agitated and anxious at work more lately. She says they have new management.   Last pap was ASCUS negative HPV 09/18/20  PCP is Dr Posey Pronto  Review of Systems More agitated at work and anxious Reviewed past medical,surgical, social and family history. Reviewed medications and allergies.     Objective:   Physical Exam BP 116/78 (BP Location: Right Arm, Patient Position: Sitting, Cuff Size: Normal)   Pulse 74   Ht '5\' 7"'$  (1.702 m)   Wt 208 lb (94.3 kg)   BMI 32.58 kg/m     Skin warm and dry.  Lungs: clear to ausculation bilaterally. Cardiovascular: regular rate and rhythm.   Upstream - 06/03/22 1023       Pregnancy Intention Screening   Does the patient want to become pregnant in the next year? No    Does the patient's partner want to become pregnant in the next year? No    Would the patient like to discuss contraceptive options today? No      Contraception Wrap Up   Current Method Hormonal Injection    End Method Hormonal Injection                06/03/2022   10:26 AM 05/09/2022   11:14 AM 04/23/2022    4:08 PM  Depression screen PHQ 2/9  Decreased Interest 3 0 1  Down, Depressed, Hopeless 3 0 2  PHQ - 2 Score 6 0 3  Altered sleeping 3 0 3  Tired, decreased energy 3 0 1  Change in appetite 0 0 1  Feeling bad or failure about yourself  0 0 0  Trouble concentrating 0 0 1  Moving slowly or fidgety/restless 0 0 3  Suicidal thoughts 0 0 0  PHQ-9 Score 12 0 12       06/03/2022   10:27 AM 04/23/2022    4:10 PM 01/21/2022   10:26 AM 10/29/2021    9:34 AM  GAD 7 : Generalized Anxiety Score  Nervous, Anxious, on Edge '3 1 1 1  '$ Control/stop worrying 3 1 0 1  Worry too much - different things '3 1 1 3  '$ Trouble relaxing '3 1 1 1  '$ Restless 3 1 0 3  Easily annoyed or irritable 3 0 1 3  Afraid -  awful might happen 1 0 0 1  Total GAD 7 Score '19 5 4 13     '$ Assessment:     1. Anxiety and depression Feels more agitated and anxious at work She is already taking lexapro 10 mg 1 daily  Will add Buspar 5 mg 1 bid Meds ordered this encounter  Medications   busPIRone (BUSPAR) 5 MG tablet    Sig: Take 1 tablet (5 mg total) by mouth 2 (two) times daily.    Dispense:  60 tablet    Refill:  4    Order Specific Question:   Supervising Provider    Answer:   Florian Buff [[id number]]      She declines counseling  Plan:     Follow up in 6 weeks for ROS

## 2022-06-05 ENCOUNTER — Ambulatory Visit (HOSPITAL_COMMUNITY): Payer: Medicaid Other | Attending: Neurosurgery | Admitting: Physical Therapy

## 2022-06-05 DIAGNOSIS — M545 Low back pain, unspecified: Secondary | ICD-10-CM | POA: Insufficient documentation

## 2022-06-05 DIAGNOSIS — M6281 Muscle weakness (generalized): Secondary | ICD-10-CM | POA: Insufficient documentation

## 2022-06-09 ENCOUNTER — Encounter: Payer: Self-pay | Admitting: Internal Medicine

## 2022-06-09 ENCOUNTER — Ambulatory Visit: Payer: Medicaid Other | Admitting: Internal Medicine

## 2022-06-09 VITALS — BP 132/84 | HR 76 | Resp 18 | Ht 67.0 in | Wt 208.8 lb

## 2022-06-09 DIAGNOSIS — M5126 Other intervertebral disc displacement, lumbar region: Secondary | ICD-10-CM

## 2022-06-09 DIAGNOSIS — M48062 Spinal stenosis, lumbar region with neurogenic claudication: Secondary | ICD-10-CM

## 2022-06-09 DIAGNOSIS — F419 Anxiety disorder, unspecified: Secondary | ICD-10-CM

## 2022-06-09 DIAGNOSIS — F32A Depression, unspecified: Secondary | ICD-10-CM

## 2022-06-09 DIAGNOSIS — H66001 Acute suppurative otitis media without spontaneous rupture of ear drum, right ear: Secondary | ICD-10-CM | POA: Diagnosis not present

## 2022-06-09 MED ORDER — TIZANIDINE HCL 4 MG PO TABS: 4.0000 mg | ORAL_TABLET | Freq: Three times a day (TID) | ORAL | 0 refills | Status: DC | PRN

## 2022-06-09 MED ORDER — OFLOXACIN 0.3 % OT SOLN: 5.0000 [drp] | Freq: Every day | OTIC | 0 refills | Status: DC

## 2022-06-09 MED ORDER — ESCITALOPRAM OXALATE 10 MG PO TABS: 10.0000 mg | ORAL_TABLET | Freq: Every day | ORAL | 1 refills | Status: DC

## 2022-06-09 MED ORDER — NAPROXEN 500 MG PO TABS: 500.0000 mg | ORAL_TABLET | Freq: Two times a day (BID) | ORAL | 1 refills | Status: DC

## 2022-06-09 NOTE — Patient Instructions (Addendum)
Please take Naproxen as needed for mild-moderate pain. Please take Tizanidine as needed for muscle spasms.  Please avoid heavy lifting and frequent bending.  Please start using Ofloxacin ear drops as prescribed.

## 2022-06-10 ENCOUNTER — Encounter (HOSPITAL_COMMUNITY): Payer: Medicaid Other | Admitting: Physical Therapy

## 2022-06-12 ENCOUNTER — Ambulatory Visit (HOSPITAL_COMMUNITY): Payer: Medicaid Other | Admitting: Physical Therapy

## 2022-06-12 ENCOUNTER — Telehealth (HOSPITAL_COMMUNITY): Payer: Self-pay | Admitting: Physical Therapy

## 2022-06-12 DIAGNOSIS — M6281 Muscle weakness (generalized): Secondary | ICD-10-CM

## 2022-06-12 DIAGNOSIS — M545 Low back pain, unspecified: Secondary | ICD-10-CM

## 2022-06-12 NOTE — Therapy (Signed)
  OUTPATIENT PHYSICAL THERAPY TREATMENT   Patient Name: MATHILDA MAGUIRE MRN: [id number] DOB:May 16, 1991, 31 y.o., female Today's Date: 06/12/2022  Pt arrived for appointment but had to leave due to picking up her children from school.  Treatment was not given this session.  No charge   Teena Irani, PTA/CLT [place] Ph: [phone]   06/12/2022, 3:53 PM

## 2022-06-12 NOTE — Telephone Encounter (Signed)
Pt came to appointment but unable to stay due to having to pick her child up from school.  Rescheduled for tomorrow.  Teena Irani, PTA/CLT [place] Ph: [phone]

## 2022-06-13 ENCOUNTER — Ambulatory Visit (HOSPITAL_COMMUNITY): Payer: Medicaid Other

## 2022-06-13 ENCOUNTER — Other Ambulatory Visit: Payer: Self-pay | Admitting: Adult Health

## 2022-06-13 ENCOUNTER — Encounter (HOSPITAL_COMMUNITY): Payer: Self-pay

## 2022-06-13 DIAGNOSIS — M6281 Muscle weakness (generalized): Secondary | ICD-10-CM | POA: Diagnosis present

## 2022-06-13 DIAGNOSIS — M545 Low back pain, unspecified: Secondary | ICD-10-CM

## 2022-06-13 NOTE — Assessment & Plan Note (Signed)
Uncontrolled Increased dose of Lexapro to 10 mg daily

## 2022-06-13 NOTE — Therapy (Signed)
OUTPATIENT PHYSICAL THERAPY THORACOLUMBAR TREATMENT   Patient Name: Kaylee Miller MRN: [id number] DOB:November 17, 1990, 31 y.o., female Today's Date: 06/13/2022   PT End of Session - 06/13/22 1644     Visit Number 3    Number of Visits 12    Date for PT Re-Evaluation 06/30/22    Authorization Type Medicaid [employer]    Authorization Time Period Auth 11 visits 9/26-12/24 with 27 visits combined therapies per year.    Authorization - Visit Number 2    Authorization - Number of Visits 11    Progress Note Due on Visit 10    PT Start Time 3825    PT Stop Time 1728    PT Time Calculation (min) 45 min    Activity Tolerance Patient tolerated treatment well;No increased pain    Behavior During Therapy [facility] for tasks assessed/performed             Past Medical History:  Diagnosis Date   Anemia    Anxiety    Asthma    Phreesia 07/02/2020   Contraceptive management 02/08/2015   COVID    mild case   CSF leak 01/29/2022   Depression    GERD (gastroesophageal reflux disease)    Headache(784.0)    migraines   History of bronchitis    HSV-2 (herpes simplex virus 2) infection    Pneumonia    Pregnant    S/P C-section 07/28/2013   Sciatic nerve pain    Seasonal allergies    Vaginal Pap smear, abnormal    Past Surgical History:  Procedure Laterality Date   adenoids     BACK SURGERY     CESAREAN SECTION  08/25/2005   CESAREAN SECTION N/A 07/28/2013   Procedure: CESAREAN SECTION;  Surgeon: Jonnie Kind, MD;  Location: [place] [facility];  Service: Obstetrics;  Laterality: N/A;   LUMBAR LAMINECTOMY/DECOMPRESSION MICRODISCECTOMY Right 05/25/2021   Procedure: DISCECTOMY Lumbar five - Sacral one;  Surgeon: Ashok Pall, MD;  Location: [place];  Service: Neurosurgery;  Laterality: Right;   LUMBAR LAMINECTOMY/DECOMPRESSION MICRODISCECTOMY Right 01/15/2022   Procedure: Right Lumbar Five-Sacral One Microdiscectomy;  Surgeon: Ashok Pall, MD;  Location: [place];  Service: Neurosurgery;   Laterality: Right;  3C/RM 19   LUMBAR LAMINECTOMY/DECOMPRESSION MICRODISCECTOMY N/A 01/30/2022   Procedure: REVISION L5-S1 LAMINECTOMY REPAIR CSS LEAK;  Surgeon: Dawley, Theodoro Doing, DO;  Location: [place];  Service: Neurosurgery;  Laterality: N/A;   LUMBAR LAMINECTOMY/DECOMPRESSION MICRODISCECTOMY N/A 02/19/2022   Procedure: Redo Lumbar five-Sacral one Microdiscectomy;  Surgeon: Ashok Pall, MD;  Location: [place];  Service: Neurosurgery;  Laterality: N/A;   tubes in ears     Patient Active Problem List   Diagnosis Date Noted   Depo-Provera contraceptive status 04/23/2022   Hordeolum externum of right upper eyelid 04/01/2022   Hospital discharge follow-up 03/06/2022   HNP (herniated nucleus pulposus), lumbar 01/15/2022   Anxiety and depression 10/29/2021   Leg swelling 09/13/2021   Body mass index (BMI) 30.0-30.9, adult 08/27/2021   Herniated nucleus pulposus, L5-S1, right 05/24/2021   Right hip pain 04/05/2021   Spinal stenosis of lumbar region with neurogenic claudication 12/19/2020   Disc displacement, lumbar 11/12/2020   Essential (primary) hypertension 11/12/2020   ASCUS of cervix with negative high risk HPV 09/21/2020   Mild asthma without complication 05/39/7673   Tobacco abuse 07/03/2020   Migraines 01/05/2013    PCP: Ihor Dow, MD  REFERRING PROVIDER: Ashok Pall, MD   REFERRING DIAG: PT eval/tx for M51.26 disc displacement, lumbar  Rationale for Evaluation and Treatment Rehabilitation  THERAPY DIAG:  Low back pain, unspecified back pain laterality, unspecified chronicity, unspecified whether sciatica present  Muscle weakness (generalized)  ONSET DATE: Most recent surgery 02/19/22 Initial onset of back pain with RLE radiculopathy in Feb of 2022  SUBJECTIVE:                                                                                                                                                                                           SUBJECTIVE STATEMENT: Pt  stated she has pain in Lt side of lower back, buttock and radicular symptoms down Lt LE ending posterior thigh ending at knee.  Reports she saw PCP Monday, encouraged to call neurologist concerning the pain and to receive MRI.  Reports she has began the HEP 3 days a week, really likes the seated piriformis stretch and the prone exercise.   Evaluation:Patient reports back pain with radiculopathy into RLE since Feb of last year. Since then has has several complications and little resolution of symptoms. 4 surgeries total reported. Most recently L5-S1 discetomy on June 28th of 2023. Saw Dr. Christella Noa last Monday for follow-up. States RLE feels weak and pain is improving but still present. Reports she was working Thursday the 14, pulling down heavy pallets/bundles down from a shelf and developed pain down into her LLE similar to past experience and symptoms of the RLE. Estimates load to be about 45#. The rest of the day she was having to bend over and tear paper from a spool. States Dr. Christella Noa plans to get an MRI.   PERTINENT HISTORY:  surgery: right redo L5/S1 discetomy on 02/19/2022   Multiple lumbar surgeries, including repair of CSF leak. PAIN:  Are you having pain? Yes: NPRS scale: 7/10 Pain location: back bil LE LT>RT not beyond knee Pain description: aches Aggravating factors: standing and sitting Relieving factors: stretching   PRECAUTIONS: Back - No more than 15# lifting  WEIGHT BEARING RESTRICTIONS No  FALLS:  Has patient fallen in last 6 months? No  LIVING ENVIRONMENT: Lives with: lives with their family children (oldest 15) Lives in: House/apartment Stairs: Yes: External: 3 steps; none Has following equipment at home: Single point cane, Walker - 2 wheeled, shower chair, and bed side commode  OCCUPATION: [employer] office  PLOF: Independent  PATIENT GOALS relieve pain   OBJECTIVE:   DIAGNOSTIC FINDINGS:  IMPRESSION: Right laminectomy L5-S1. Apparent recurrent disc protrusion on  the right with progressive mass-effect on the thecal sac and right S1 nerve root.   Interval improvement in CSF leak arising at the laminectomy defect L5-S1 on the right extending into the subcutaneous tissues  PATIENT SURVEYS:  Modified Oswestry 19 / 50 = 38.0 %   SCREENING FOR RED FLAGS: Bowel or bladder incontinence: No Spinal tumors: No Cauda equina syndrome: No Compression fracture: No Abdominal aneurysm: No  COGNITION:  Overall cognitive status: Within functional limits for tasks assessed     SENSATION: WFL   LUMBAR ROM:   Active  A/PROM  eval  Flexion To distal patellae  Extension wnl  Right lateral flexion jointline  Left lateral flexion jointline  Right rotation Limited 15%  Left rotation WNL  No alteration of pain at end ranges  (Blank rows = not tested)  LOWER EXTREMITY ROM:     WFL   LOWER EXTREMITY MMT:    MMT Right eval Left eval  Hip flexion 4 4  Hip extension 4- 4+  Hip abduction 4 4  Hip adduction 4 4  Hip internal rotation    Hip external rotation 4+ 4+  Knee flexion 5 5  Knee extension 4+ 4  Ankle dorsiflexion 5 5  Ankle plantarflexion 6/10 10/10  Ankle inversion    Ankle eversion 5 5   (Blank rows = not tested)    FUNCTIONAL TESTS:  5 times sit to stand: 20.1 sec  GAIT: WNL   TODAY'S TREATMENT  06/13/22 Reviewed goals, educated importance of HEP compliance for maximal benefits, pt able to recall and demonstrate appropriately  Prone:  POE x 1 min  Prone press up 5x 10"  Hip extension 10x Supine: bridge 10x  Deep breathing x 1 min  Diaphragmatic breathing x 1 min  Abdominal sets x 3 minutes Seated: piriformis stretch in seated position  06/12/22 Goal and HEp review Seated:  sit to stand 10X no UE   Eval MMT, ROM 5x sit to stand Education HEP   PATIENT EDUCATION:  Education details: Findings, symptom awareness, activity modification, POC, and PT role Person educated: Patient Education method:  Explanation Education comprehension: verbalized understanding   HOME EXERCISE PROGRAM: Access Code: [email or web address] URL: [email or web address] Date: 05/20/2022 Prepared by: Candie Mile 06/13/22:   Bridge and piriformis in figure 4 position Exercises - Prone on Elbows Stretch  - 3 x daily - 7 x weekly - 10 reps - 5 second hold - Supine Transversus Abdominis Bracing - Hands on Stomach  - 3 x daily - 7 x weekly - 10 reps - Supine Figure 4 Piriformis Stretch  - 3 x daily - 7 x weekly - 3 sets - 15 second hold - Supine Piriformis Stretch with Foot on Ground  - 3 x daily - 7 x weekly - 3 sets - 15 second hold  ASSESSMENT:  CLINICAL IMPRESSION: Reviewed goals, educated importance of HEP compliance for maximal benefits, pt able to recall and reports she likes the seated piriformis stretch.  Session focus with extension based exercises with additional lumbar mobility and gluteal strengthening.  Pt reports improve centralization with POE and prone press up exercise.  Pt does demonstrate gluteal and abdominal weakness, encouraged to increase compliance with HEP for maximal benefits with strengthening as well as mobility.  Tendency to hold breath while completing abdominal sets, educated to pair with exhalation to reduce holding breath.  Encouraged to complete 100 ab sets a day.   OBJECTIVE IMPAIRMENTS decreased activity tolerance, decreased mobility, decreased ROM, decreased strength, hypomobility, impaired flexibility, improper body mechanics, postural dysfunction, obesity, and pain.   ACTIVITY LIMITATIONS carrying, lifting, bending, sitting, standing, toileting, reach over head, and locomotion level  PARTICIPATION LIMITATIONS: cleaning, shopping, community activity, and occupation  PERSONAL  FACTORS Past/current experiences and multiple lumbar surgeries  are also affecting patient's functional outcome.   REHAB POTENTIAL: Good  CLINICAL DECISION MAKING:  Stable/uncomplicated  EVALUATION COMPLEXITY: Low   GOALS: Goals reviewed with patient? Yes  SHORT TERM GOALS: Target date: 06/10/22  Patient will be independent with initial HEP and self-management strategies to improve functional outcomes Baseline: Initiated Goal status: IN PROGRESS    LONG TERM GOALS: Target date: 07/01/22  Patient will be independent with advanced HEP and self-management strategies to improve functional outcomes Baseline:  Goal status: IN PROGRESS  2.  Patient will improve modified oswetry score by 13 points or greater to indicate improvement in functional outcomes Baseline: 19 / 50 = 38.0 % Goal status: IN PROGRESS  3.  Patient will report reduction of back pain to <3/10 for improved quality of life and ability to perform ADLs  Baseline: 7/10  Goal status: IN PROGRESS  4. Patient will have equal to or > 4+/5 MMT throughout BIL LEs to improve ability to perform functional mobility, stair ambulation and ADLs.  Baseline: See above Goal status: IN PROGRESS     5. Patient will improve 5x sit to stand <12 seconds to indicate significant improvement in functional strength.    Baseline: 20.1 sec    Goal status: IN PROGRESS  PLAN: PT FREQUENCY: 2x/week  PT DURATION: 4 weeks  PLANNED INTERVENTIONS: Therapeutic exercises, Therapeutic activity, Neuromuscular re-education, Balance training, Gait training, Patient/Family education, Self Care, Joint mobilization, Joint manipulation, Stair training, DME instructions, Dry Needling, Electrical stimulation, Spinal manipulation, Spinal mobilization, Cryotherapy, Moist heat, scar mobilization, Taping, Traction, Ultrasound, Ionotophoresis '4mg'$ /ml Dexamethasone, Manual therapy, and Re-evaluation.  PLAN FOR NEXT SESSION: Extension preference for centralization of symptoms; Core exercises, LE strengthening, gradual restoration of lumbar ROM. Lifting mechanics for work demands.   Ihor Austin, LPTA/CLT;  Delana Meyer [phone]  06/13/2022, 5:31 PM

## 2022-06-13 NOTE — Assessment & Plan Note (Signed)
Follows with Spine specialist Avoid heavy lifting She needs repeat MRI of lumbar spine, advised to contact spine surgeon Previous MRI of lumbar spine showed spinal stenosis, which can explain her recurrent bilateral back pain

## 2022-06-13 NOTE — Progress Notes (Signed)
Acute Office Visit  Subjective:    Patient ID: Kaylee Miller, female    DOB: July 03, 1991, 31 y.o.   MRN: [id number]  Chief Complaint  Patient presents with   Acute Visit    Pt still having butt and left side pain this started back up 06/02/22     HPI Patient is in today for complaint of low back pain, which is acute on chronic, radiating to the left buttock and left LE, worse with movement and better with rest.  She has had lumbar spine surgery for herniated nucleus pulposus of lumbar spine.  She was advised to get MRI of lumbar spine by her spine surgeon, but was not currently contacted to schedule it.  She was recently given prednisone for acute low back pain episode, which had improved her symptoms.  She denies any heavy lifting or injury since the last visit.  She also reports left ear pain and fullness for the last few days.  She has also noticed whitish discharge from the ear.  Denies any fever or chills currently.  Denies any nasal congestion or postnasal drip.  She recently saw OB/GYN for her depression symptoms.  She was told to take Lexapro 10 mg daily instead of 5 mg daily.  She has had better response with higher dose now.  Denies any SI or HI currently.  Past Medical History:  Diagnosis Date   Anemia    Anxiety    Asthma    Phreesia 07/02/2020   Contraceptive management 02/08/2015   COVID    mild case   CSF leak 01/29/2022   Depression    GERD (gastroesophageal reflux disease)    Headache(784.0)    migraines   History of bronchitis    HSV-2 (herpes simplex virus 2) infection    Pneumonia    Pregnant    S/P C-section 07/28/2013   Sciatic nerve pain    Seasonal allergies    Vaginal Pap smear, abnormal     Past Surgical History:  Procedure Laterality Date   adenoids     BACK SURGERY     CESAREAN SECTION  08/25/2005   CESAREAN SECTION N/A 07/28/2013   Procedure: CESAREAN SECTION;  Surgeon: Jonnie Kind, MD;  Location: [place] [facility];  Service: Obstetrics;   Laterality: N/A;   LUMBAR LAMINECTOMY/DECOMPRESSION MICRODISCECTOMY Right 05/25/2021   Procedure: DISCECTOMY Lumbar five - Sacral one;  Surgeon: Ashok Pall, MD;  Location: [place];  Service: Neurosurgery;  Laterality: Right;   LUMBAR LAMINECTOMY/DECOMPRESSION MICRODISCECTOMY Right 01/15/2022   Procedure: Right Lumbar Five-Sacral One Microdiscectomy;  Surgeon: Ashok Pall, MD;  Location: [place];  Service: Neurosurgery;  Laterality: Right;  3C/RM 19   LUMBAR LAMINECTOMY/DECOMPRESSION MICRODISCECTOMY N/A 01/30/2022   Procedure: REVISION L5-S1 LAMINECTOMY REPAIR CSS LEAK;  Surgeon: Dawley, Theodoro Doing, DO;  Location: [place];  Service: Neurosurgery;  Laterality: N/A;   LUMBAR LAMINECTOMY/DECOMPRESSION MICRODISCECTOMY N/A 02/19/2022   Procedure: Redo Lumbar five-Sacral one Microdiscectomy;  Surgeon: Ashok Pall, MD;  Location: [place];  Service: Neurosurgery;  Laterality: N/A;   tubes in ears      Family History  Problem Relation Age of Onset   Cancer Mother    Breast cancer Mother    Hypertension Father    Stroke Father    CAD Maternal Grandfather    CAD Paternal Grandmother    CAD Paternal Grandfather    Diabetes Other        great-great grandmother   CAD Other     Social History  Socioeconomic History   Marital status: Single    Spouse name: Not on file   Number of children: Not on file   Years of education: Not on file   Highest education level: Not on file  Occupational History   Not on file  Tobacco Use   Smoking status: Every Day    Packs/day: 0.50    Years: 9.00    Total pack years: 4.50    Types: Cigarettes    Passive exposure: Never   Smokeless tobacco: Never  Vaping Use   Vaping Use: Never used  Substance and Sexual Activity   Alcohol use: Yes    Comment: Occa   Drug use: Never   Sexual activity: Not Currently    Birth control/protection: Injection  Other Topics Concern   Not on file  Social History Narrative   Not on file   Social Determinants of Health    Financial Resource Strain: Medium Risk (09/18/2020)   Overall Financial Resource Strain (CARDIA)    Difficulty of Paying Living Expenses: Somewhat hard  Food Insecurity: Food Insecurity Present (09/18/2020)   Hunger Vital Sign    Worried About Running Out of Food in the Last Year: Sometimes true    Ran Out of Food in the Last Year: Never true  Transportation Needs: No Transportation Needs (09/18/2020)   PRAPARE - Hydrologist (Medical): No    Lack of Transportation (Non-Medical): No  Physical Activity: Sufficiently Active (09/18/2020)   Exercise Vital Sign    Days of Exercise per Week: 5 days    Minutes of Exercise per Session: 100 min  Stress: No Stress Concern Present (09/18/2020)   [place]    Feeling of Stress : Only a little  Social Connections: Moderately Integrated (09/18/2020)   Social Connection and Isolation Panel [NHANES]    Frequency of Communication with Friends and Family: More than three times a week    Frequency of Social Gatherings with Friends and Family: Twice a week    Attends Religious Services: More than 4 times per year    Active Member of [employer] or Organizations: Yes    Attends Archivist Meetings: 1 to 4 times per year    Marital Status: Never married  Intimate Partner Violence: Unknown (09/18/2020)   Humiliation, Afraid, Rape, and Kick questionnaire    Fear of Current or Ex-Partner: No    Emotionally Abused: Patient refused    Physically Abused: No    Sexually Abused: No    Outpatient Medications Prior to Visit  Medication Sig Dispense Refill   albuterol (VENTOLIN HFA) 108 (90 Base) MCG/ACT inhaler Inhale 2 puffs into the lungs every 6 (six) hours as needed for wheezing or shortness of breath. 6.7 g 0   aspirin-acetaminophen-caffeine (EXCEDRIN MIGRAINE) 250-250-65 MG tablet Take 2 tablets by mouth every 6 (six) hours as needed for migraine.      busPIRone (BUSPAR) 5 MG tablet Take 1 tablet (5 mg total) by mouth 2 (two) times daily. 60 tablet 4   docusate sodium (COLACE) 100 MG capsule Take 200 mg by mouth daily.     fluticasone (FLONASE) 50 MCG/ACT nasal spray Place 2 sprays into both nostrils daily as needed for allergies or rhinitis.     gabapentin (NEURONTIN) 300 MG capsule Take by mouth.     HYDROcodone-acetaminophen (NORCO) 7.5-325 MG tablet Take 1 tablet by mouth every 6 (six) hours as needed.     medroxyPROGESTERone  Acetate 150 MG/ML SUSY Inject 1 mL (150 mg total) into the muscle every 3 (three) months. INJECT 1 ML INTO THE MUSCLE EVERY 3 MONTHS Strength: 150 mg/mL 1 mL 4   Melatonin 5 MG CAPS Take 5 mg by mouth at bedtime as needed (sleep).     ondansetron (ZOFRAN ODT) 4 MG disintegrating tablet Take 1 tablet (4 mg total) by mouth every 8 (eight) hours as needed for nausea or vomiting. 20 tablet 0   escitalopram (LEXAPRO) 10 MG tablet Take 0.5 tablets (5 mg total) by mouth daily. (Patient taking differently: Take 10 mg by mouth daily.) 90 tablet 1   naproxen (NAPROSYN) 500 MG tablet TAKE 1 TABLET BY MOUTH TWICE DAILY WITH A MEAL 60 tablet 0   tiZANidine (ZANAFLEX) 4 MG tablet Take 1 tablet (4 mg total) by mouth every 8 (eight) hours as needed for muscle spasms. 30 tablet 0   No facility-administered medications prior to visit.    Allergies  Allergen Reactions   Latex Swelling    Review of Systems  Constitutional:  Negative for chills and fever.  HENT:  Positive for ear discharge and ear pain. Negative for congestion, sinus pressure, sinus pain and sore throat.   Eyes:  Negative for pain and discharge.  Respiratory:  Negative for cough and shortness of breath.   Cardiovascular:  Positive for leg swelling. Negative for chest pain and palpitations.  Gastrointestinal:  Positive for constipation. Negative for abdominal pain, diarrhea, nausea and vomiting.  Endocrine: Negative for polydipsia and polyuria.  Genitourinary:   Negative for dysuria and hematuria.  Musculoskeletal:  Positive for back pain. Negative for neck pain and neck stiffness.  Skin:  Negative for rash.  Neurological:  Negative for dizziness, speech difficulty, weakness and headaches.  Psychiatric/Behavioral:  Negative for agitation and behavioral problems.        Objective:    Physical Exam Vitals reviewed.  Constitutional:      General: She is not in acute distress.    Appearance: She is not diaphoretic.  HENT:     Head: Normocephalic and atraumatic.     Right Ear: No drainage or tenderness. No middle ear effusion.     Left Ear: Drainage and tenderness present. A middle ear effusion is present.     Nose: Nose normal. No congestion.     Mouth/Throat:     Mouth: Mucous membranes are moist.     Pharynx: No posterior oropharyngeal erythema.  Eyes:     General: No scleral icterus.    Extraocular Movements: Extraocular movements intact.  Cardiovascular:     Rate and Rhythm: Normal rate and regular rhythm.     Pulses: Normal pulses.     Heart sounds: Normal heart sounds. No murmur heard. Pulmonary:     Breath sounds: Normal breath sounds. No wheezing or rales.  Musculoskeletal:     Cervical back: Neck supple. No tenderness.     Right lower leg: No edema.     Left lower leg: No edema.     Comments: ROM of the lumbar spine limited due to pain, point tenderness in the left lumbar paraspinal area SLR positive on left side  Skin:    General: Skin is warm.     Findings: No rash.     Comments: Incision site C/D/I  Neurological:     General: No focal deficit present.     Mental Status: She is alert and oriented to person, place, and time.  Psychiatric:  Mood and Affect: Mood normal.        Behavior: Behavior normal.     BP 132/84 (BP Location: Right Arm, Patient Position: Sitting, Cuff Size: Normal)   Pulse 76   Resp 18   Ht '5\' 7"'$  (1.702 m)   Wt 208 lb 12.8 oz (94.7 kg)   SpO2 98%   BMI 32.70 kg/m  Wt Readings from  Last 3 Encounters:  06/09/22 208 lb 12.8 oz (94.7 kg)  06/03/22 208 lb (94.3 kg)  05/09/22 211 lb (95.7 kg)        Assessment & Plan:   Problem List Items Addressed This Visit       Musculoskeletal and Integument   HNP (herniated nucleus pulposus), lumbar - Primary    S/p lumbar laminecomy with microdiscectomy in 10/22 S/p L5-S1 re-do microdiscectomy in 06/23 F/u with Spine surgeon Refilled tizanidine Was recently given prednisone 40 mg X 5 days, avoid recurrent steroid use Avoid heavy lifting and frequent bending      Relevant Medications   naproxen (NAPROSYN) 500 MG tablet   tiZANidine (ZANAFLEX) 4 MG tablet     Other   Spinal stenosis of lumbar region with neurogenic claudication    Follows with Spine specialist Avoid heavy lifting She needs repeat MRI of lumbar spine, advised to contact spine surgeon Previous MRI of lumbar spine showed spinal stenosis, which can explain her recurrent bilateral back pain      Relevant Medications   naproxen (NAPROSYN) 500 MG tablet   tiZANidine (ZANAFLEX) 4 MG tablet   escitalopram (LEXAPRO) 10 MG tablet   Anxiety and depression    Uncontrolled Increased dose of Lexapro to 10 mg daily      Relevant Medications   escitalopram (LEXAPRO) 10 MG tablet   Other Visit Diagnoses     Non-recurrent acute suppurative otitis media of right ear without spontaneous rupture of tympanic membrane     Ear pain, discharge and middle ear effusion Started ofloxacin otic drops   Relevant Medications   ofloxacin (FLOXIN OTIC) 0.3 % OTIC solution        Meds ordered this encounter  Medications   naproxen (NAPROSYN) 500 MG tablet    Sig: Take 1 tablet (500 mg total) by mouth 2 (two) times daily with a meal.    Dispense:  60 tablet    Refill:  1   tiZANidine (ZANAFLEX) 4 MG tablet    Sig: Take 1 tablet (4 mg total) by mouth every 8 (eight) hours as needed for muscle spasms.    Dispense:  30 tablet    Refill:  0   ofloxacin (FLOXIN OTIC)  0.3 % OTIC solution    Sig: Place 5 drops into the right ear daily.    Dispense:  5 mL    Refill:  0   escitalopram (LEXAPRO) 10 MG tablet    Sig: Take 1 tablet (10 mg total) by mouth daily.    Dispense:  90 tablet    Refill:  1     Jarel Cuadra Keith Rake, MD

## 2022-06-13 NOTE — Assessment & Plan Note (Signed)
S/p lumbar laminecomy with microdiscectomy in 10/22 S/p L5-S1 re-do microdiscectomy in 06/23 F/u with Spine surgeon Refilled tizanidine Was recently given prednisone 40 mg X 5 days, avoid recurrent steroid use Avoid heavy lifting and frequent bending

## 2022-06-16 ENCOUNTER — Ambulatory Visit (HOSPITAL_COMMUNITY): Payer: Medicaid Other | Admitting: Physical Therapy

## 2022-06-16 DIAGNOSIS — M6281 Muscle weakness (generalized): Secondary | ICD-10-CM

## 2022-06-16 DIAGNOSIS — M545 Low back pain, unspecified: Secondary | ICD-10-CM | POA: Diagnosis not present

## 2022-06-16 NOTE — Therapy (Signed)
OUTPATIENT PHYSICAL THERAPY THORACOLUMBAR TREATMENT   Patient Name: Kaylee Miller MRN: [id number] DOB:1991/06/12, 31 y.o., female Today's Date: 06/16/2022   PT End of Session - 06/16/22 1116     Visit Number 3    Number of Visits 12    Date for PT Re-Evaluation 06/30/22    Authorization Type Medicaid [employer]    Authorization Time Period Auth 11 visits 9/26-12/24 with 27 visits combined therapies per year.    Authorization - Visit Number 3    Authorization - Number of Visits 11    Progress Note Due on Visit 10    PT Start Time 1115    PT Stop Time 1155    PT Time Calculation (min) 40 min    Activity Tolerance Patient tolerated treatment well;No increased pain    Behavior During Therapy [facility] for tasks assessed/performed             Past Medical History:  Diagnosis Date   Anemia    Anxiety    Asthma    Phreesia 07/02/2020   Contraceptive management 02/08/2015   COVID    mild case   CSF leak 01/29/2022   Depression    GERD (gastroesophageal reflux disease)    Headache(784.0)    migraines   History of bronchitis    HSV-2 (herpes simplex virus 2) infection    Pneumonia    Pregnant    S/P C-section 07/28/2013   Sciatic nerve pain    Seasonal allergies    Vaginal Pap smear, abnormal    Past Surgical History:  Procedure Laterality Date   adenoids     BACK SURGERY     CESAREAN SECTION  08/25/2005   CESAREAN SECTION N/A 07/28/2013   Procedure: CESAREAN SECTION;  Surgeon: Jonnie Kind, MD;  Location: [facility] [place] [facility];  Service: Obstetrics;  Laterality: N/A;   LUMBAR LAMINECTOMY/DECOMPRESSION MICRODISCECTOMY Right 05/25/2021   Procedure: DISCECTOMY Lumbar five - Sacral one;  Surgeon: Ashok Pall, MD;  Location: [place];  Service: Neurosurgery;  Laterality: Right;   LUMBAR LAMINECTOMY/DECOMPRESSION MICRODISCECTOMY Right 01/15/2022   Procedure: Right Lumbar Five-Sacral One Microdiscectomy;  Surgeon: Ashok Pall, MD;  Location: [place];  Service: Neurosurgery;   Laterality: Right;  3C/RM 19   LUMBAR LAMINECTOMY/DECOMPRESSION MICRODISCECTOMY N/A 01/30/2022   Procedure: REVISION L5-S1 LAMINECTOMY REPAIR CSS LEAK;  Surgeon: Dawley, Theodoro Doing, DO;  Location: [place];  Service: Neurosurgery;  Laterality: N/A;   LUMBAR LAMINECTOMY/DECOMPRESSION MICRODISCECTOMY N/A 02/19/2022   Procedure: Redo Lumbar five-Sacral one Microdiscectomy;  Surgeon: Ashok Pall, MD;  Location: [place];  Service: Neurosurgery;  Laterality: N/A;   tubes in ears     Patient Active Problem List   Diagnosis Date Noted   Depo-Provera contraceptive status 04/23/2022   Hordeolum externum of right upper eyelid 04/01/2022   Hospital discharge follow-up 03/06/2022   HNP (herniated nucleus pulposus), lumbar 01/15/2022   Anxiety and depression 10/29/2021   Leg swelling 09/13/2021   Body mass index (BMI) 30.0-30.9, adult 08/27/2021   Herniated nucleus pulposus, L5-S1, right 05/24/2021   Right hip pain 04/05/2021   Spinal stenosis of lumbar region with neurogenic claudication 12/19/2020   Disc displacement, lumbar 11/12/2020   Essential (primary) hypertension 11/12/2020   ASCUS of cervix with negative high risk HPV 09/21/2020   Mild asthma without complication 88/32/5498   Tobacco abuse 07/03/2020   Migraines 01/05/2013    PCP: Ihor Dow, MD  REFERRING PROVIDER: Ashok Pall, MD   REFERRING DIAG: PT eval/tx for M51.26 disc displacement, lumbar  Rationale for Evaluation and Treatment Rehabilitation  THERAPY DIAG:  Low back pain, unspecified back pain laterality, unspecified chronicity, unspecified whether sciatica present  Muscle weakness (generalized)  ONSET DATE: Most recent surgery 02/19/22 Initial onset of back pain with RLE radiculopathy in Feb of 2022  SUBJECTIVE:                                                                                                                                                                                           SUBJECTIVE STATEMENT: 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  States the floor at work is "bumpy" and feels this is aggravating her back.  Reports her pain is in Lt side of lower back, buttock and radicular symptoms down Lt LE ending posterior thigh ending at knee.  States hot showers help loosen it up.  States she is suppose to be getting an MRI but has to have therapy first for insurance.  Currently 5/10 pain.   Evaluation:Patient reports back pain with radiculopathy into RLE since Feb of last year. Since then has has several complications and little resolution of symptoms. 4 surgeries total reported. Most recently L5-S1 discetomy on June 28th of 2023. Saw Dr. Christella Noa last Monday for follow-up. States RLE feels weak and pain is improving but still present. Reports she was working Thursday the 14, pulling down heavy pallets/bundles down from a shelf and developed pain down into her LLE similar to past experience and symptoms of the RLE. Estimates load to be about 45#. The rest of the day she was having to bend over and tear paper from a spool. States Dr. Christella Noa plans to get an MRI.   PERTINENT HISTORY:  surgery: right redo L5/S1 discetomy on 02/19/2022   Multiple lumbar surgeries, including repair of CSF leak. PAIN:  Are you having pain? Yes: NPRS scale: 5/10 Pain location: back bil LE LT>RT not beyond knee Pain description: aches Aggravating factors: standing and sitting Relieving factors: stretching   PRECAUTIONS: Back - No more than 15# lifting  WEIGHT BEARING RESTRICTIONS No  FALLS:  Has patient fallen in last 6 months? No  LIVING ENVIRONMENT: Lives with: lives with their family children (oldest 34) Lives in: House/apartment Stairs: Yes: External: 3 steps; none Has following equipment at home: Single point cane, Walker - 2 wheeled, shower chair, and bed side commode  OCCUPATION: full time employee; Programmer, systems for [employer]  PLOF: Independent  PATIENT GOALS relieve  pain   OBJECTIVE:   DIAGNOSTIC FINDINGS:  IMPRESSION: Right laminectomy L5-S1. Apparent recurrent disc protrusion on the right with progressive mass-effect on the thecal sac and  right S1 nerve root.   Interval improvement in CSF leak arising at the laminectomy defect L5-S1 on the right extending into the subcutaneous tissues  PATIENT SURVEYS:  Modified Oswestry 19 / 50 = 38.0 %   SCREENING FOR RED FLAGS: Bowel or bladder incontinence: No Spinal tumors: No Cauda equina syndrome: No Compression fracture: No Abdominal aneurysm: No  COGNITION:  Overall cognitive status: Within functional limits for tasks assessed     SENSATION: WFL   LUMBAR ROM:   Active  A/PROM  eval  Flexion To distal patellae  Extension wnl  Right lateral flexion jointline  Left lateral flexion jointline  Right rotation Limited 15%  Left rotation WNL  No alteration of pain at end ranges  (Blank rows = not tested)  LOWER EXTREMITY ROM:     WFL   LOWER EXTREMITY MMT:    MMT Right eval Left eval  Hip flexion 4 4  Hip extension 4- 4+  Hip abduction 4 4  Hip adduction 4 4  Hip internal rotation    Hip external rotation 4+ 4+  Knee flexion 5 5  Knee extension 4+ 4  Ankle dorsiflexion 5 5  Ankle plantarflexion 6/10 10/10  Ankle inversion    Ankle eversion 5 5   (Blank rows = not tested)    FUNCTIONAL TESTS:  5 times sit to stand: 20.1 sec  GAIT: WNL   TODAY'S TREATMENT  06/16/22 Seated:  Sit to stands no UE standard chair 10X   Piriformis stretch 3X20" each Standing:  heelraise 20X  Hip abduction 10X each  Hip extension 10X each Supine:  abdominal sets with breathing 10X5"  Bridge 10X each  SLR 10X each Prone:  POE 1 minute  Press ups 10X  Hip extensions 10X  06/13/22 Reviewed goals, educated importance of HEP compliance for maximal benefits, pt able to recall and demonstrate appropriately  Prone:  POE x 1 min  Prone press up 5x 10"  Hip extension 10x Supine:  bridge 10x  Deep breathing x 1 min  Diaphragmatic breathing x 1 min  Abdominal sets x 3 minutes Seated: piriformis stretch in seated position  06/12/22 Goal and HEp review Seated:  sit to stand 10X no UE   Eval MMT, ROM 5x sit to stand Education HEP   PATIENT EDUCATION:  Education details: Findings, symptom awareness, activity modification, POC, and PT role Person educated: Patient Education method: Explanation Education comprehension: verbalized understanding   HOME EXERCISE PROGRAM: Access Code: [email or web address] URL: [email or web address] Date: 05/20/2022 Prepared by: Candie Mile 06/13/22:   Bridge and piriformis in figure 4 position Exercises - Prone on Elbows Stretch  - 3 x daily - 7 x weekly - 10 reps - 5 second hold - Supine Transversus Abdominis Bracing - Hands on Stomach  - 3 x daily - 7 x weekly - 10 reps - Supine Figure 4 Piriformis Stretch  - 3 x daily - 7 x weekly - 3 sets - 15 second hold - Supine Piriformis Stretch with Foot on Ground  - 3 x daily - 7 x weekly - 3 sets - 15 second hold  ASSESSMENT:  CLINICAL IMPRESSION: Continued focus on extension based exercises with addition of standing LE strengthening.  Added SLR in supine also in addition to standing strengthening.  Continues to have good results with POE and prone press up exercise and reports she does this at night as well to help reduce her pain.  Pt with improved breathing technique with less tendency to hold breath while  completing abdominal sets and other stab activities this session.  Reported feeling better at end of session after completing exercises and painfree.  Pt will continue to benefit from skilled therapy to reduce deficits.   OBJECTIVE IMPAIRMENTS decreased activity tolerance, decreased mobility, decreased ROM, decreased strength, hypomobility, impaired flexibility, improper body mechanics, postural dysfunction, obesity, and pain.   ACTIVITY LIMITATIONS carrying, lifting,  bending, sitting, standing, toileting, reach over head, and locomotion level  PARTICIPATION LIMITATIONS: cleaning, shopping, community activity, and occupation  PERSONAL FACTORS Past/current experiences and multiple lumbar surgeries  are also affecting patient's functional outcome.   REHAB POTENTIAL: Good  CLINICAL DECISION MAKING: Stable/uncomplicated  EVALUATION COMPLEXITY: Low   GOALS: Goals reviewed with patient? Yes  SHORT TERM GOALS: Target date: 06/10/22  Patient will be independent with initial HEP and self-management strategies to improve functional outcomes Baseline: Initiated Goal status: IN PROGRESS    LONG TERM GOALS: Target date: 07/01/22  Patient will be independent with advanced HEP and self-management strategies to improve functional outcomes Baseline:  Goal status: IN PROGRESS  2.  Patient will improve modified oswetry score by 13 points or greater to indicate improvement in functional outcomes Baseline: 19 / 50 = 38.0 % Goal status: IN PROGRESS  3.  Patient will report reduction of back pain to <3/10 for improved quality of life and ability to perform ADLs  Baseline: 7/10  Goal status: IN PROGRESS  4. Patient will have equal to or > 4+/5 MMT throughout BIL LEs to improve ability to perform functional mobility, stair ambulation and ADLs.  Baseline: See above Goal status: IN PROGRESS     5. Patient will improve 5x sit to stand <12 seconds to indicate significant improvement in functional strength.    Baseline: 20.1 sec    Goal status: IN PROGRESS  PLAN: PT FREQUENCY: 2x/week  PT DURATION: 4 weeks  PLANNED INTERVENTIONS: Therapeutic exercises, Therapeutic activity, Neuromuscular re-education, Balance training, Gait training, Patient/Family education, Self Care, Joint mobilization, Joint manipulation, Stair training, DME instructions, Dry Needling, Electrical stimulation, Spinal manipulation, Spinal mobilization, Cryotherapy, Moist heat, scar  mobilization, Taping, Traction, Ultrasound, Ionotophoresis '4mg'$ /ml Dexamethasone, Manual therapy, and Re-evaluation.  PLAN FOR NEXT SESSION: Extension preference for centralization of symptoms; Core exercises, LE strengthening, gradual restoration of lumbar ROM. Lifting mechanics for work demands.   Teena Irani, PTA/CLT [place] Ph: [phone]  06/16/2022, 1:09 PM

## 2022-06-18 ENCOUNTER — Ambulatory Visit (HOSPITAL_COMMUNITY): Payer: Medicaid Other | Admitting: Physical Therapy

## 2022-06-18 DIAGNOSIS — M6281 Muscle weakness (generalized): Secondary | ICD-10-CM

## 2022-06-18 DIAGNOSIS — M545 Low back pain, unspecified: Secondary | ICD-10-CM

## 2022-06-18 NOTE — Therapy (Signed)
OUTPATIENT PHYSICAL THERAPY THORACOLUMBAR TREATMENT   Patient Name: Kaylee Miller MRN: [id number] DOB:02-Apr-1991, 31 y.o., female Today's Date: 06/18/2022   PT End of Session - 06/18/22 1603     Visit Number 4    Number of Visits 12    Date for PT Re-Evaluation 06/30/22    Authorization Type Medicaid [employer]    Authorization Time Period Auth 11 visits 9/26-12/24 with 27 visits combined therapies per year.    Authorization - Visit Number 4    Authorization - Number of Visits 11    Progress Note Due on Visit 10    PT Start Time 1602    PT Stop Time 1640    PT Time Calculation (min) 38 min    Activity Tolerance Patient tolerated treatment well;No increased pain    Behavior During Therapy [facility] for tasks assessed/performed             Past Medical History:  Diagnosis Date   Anemia    Anxiety    Asthma    Phreesia 07/02/2020   Contraceptive management 02/08/2015   COVID    mild case   CSF leak 01/29/2022   Depression    GERD (gastroesophageal reflux disease)    Headache(784.0)    migraines   History of bronchitis    HSV-2 (herpes simplex virus 2) infection    Pneumonia    Pregnant    S/P C-section 07/28/2013   Sciatic nerve pain    Seasonal allergies    Vaginal Pap smear, abnormal    Past Surgical History:  Procedure Laterality Date   adenoids     BACK SURGERY     CESAREAN SECTION  08/25/2005   CESAREAN SECTION N/A 07/28/2013   Procedure: CESAREAN SECTION;  Surgeon: Jonnie Kind, MD;  Location: [facility];  Service: Obstetrics;  Laterality: N/A;   LUMBAR LAMINECTOMY/DECOMPRESSION MICRODISCECTOMY Right 05/25/2021   Procedure: DISCECTOMY Lumbar five - Sacral one;  Surgeon: Ashok Pall, MD;  Location: [place];  Service: Neurosurgery;  Laterality: Right;   LUMBAR LAMINECTOMY/DECOMPRESSION MICRODISCECTOMY Right 01/15/2022   Procedure: Right Lumbar Five-Sacral One Microdiscectomy;  Surgeon: Ashok Pall, MD;  Location: [place];  Service: Neurosurgery;   Laterality: Right;  3C/RM 19   LUMBAR LAMINECTOMY/DECOMPRESSION MICRODISCECTOMY N/A 01/30/2022   Procedure: REVISION L5-S1 LAMINECTOMY REPAIR CSS LEAK;  Surgeon: Dawley, Theodoro Doing, DO;  Location: [place];  Service: Neurosurgery;  Laterality: N/A;   LUMBAR LAMINECTOMY/DECOMPRESSION MICRODISCECTOMY N/A 02/19/2022   Procedure: Redo Lumbar five-Sacral one Microdiscectomy;  Surgeon: Ashok Pall, MD;  Location: [place];  Service: Neurosurgery;  Laterality: N/A;   tubes in ears     Patient Active Problem List   Diagnosis Date Noted   Depo-Provera contraceptive status 04/23/2022   Hordeolum externum of right upper eyelid 04/01/2022   Hospital discharge follow-up 03/06/2022   HNP (herniated nucleus pulposus), lumbar 01/15/2022   Anxiety and depression 10/29/2021   Leg swelling 09/13/2021   Body mass index (BMI) 30.0-30.9, adult 08/27/2021   Herniated nucleus pulposus, L5-S1, right 05/24/2021   Right hip pain 04/05/2021   Spinal stenosis of lumbar region with neurogenic claudication 12/19/2020   Disc displacement, lumbar 11/12/2020   Essential (primary) hypertension 11/12/2020   ASCUS of cervix with negative high risk HPV 09/21/2020   Mild asthma without complication 29/93/7169   Tobacco abuse 07/03/2020   Migraines 01/05/2013    PCP: Ihor Dow, MD  REFERRING PROVIDER: Ashok Pall, MD   REFERRING DIAG: PT eval/tx for M51.26 disc displacement, lumbar  Rationale for Evaluation and Treatment Rehabilitation  THERAPY DIAG:  Low back pain, unspecified back pain laterality, unspecified chronicity, unspecified whether sciatica present  Muscle weakness (generalized)  ONSET DATE: Most recent surgery 02/19/22 Initial onset of back pain with RLE radiculopathy in Feb of 2022  SUBJECTIVE:                                                                                                                                                                                           SUBJECTIVE STATEMENT: Pt  states she's just getting off work.  States she's not really having any pain today just tired.    Evaluation:Patient reports back pain with radiculopathy into RLE since Feb of last year. Since then has has several complications and little resolution of symptoms. 4 surgeries total reported. Most recently L5-S1 discetomy on June 28th of 2023. Saw Dr. Christella Noa last Monday for follow-up. States RLE feels weak and pain is improving but still present. Reports she was working Thursday the 14, pulling down heavy pallets/bundles down from a shelf and developed pain down into her LLE similar to past experience and symptoms of the RLE. Estimates load to be about 45#. The rest of the day she was having to bend over and tear paper from a spool. States Dr. Christella Noa plans to get an MRI.   PERTINENT HISTORY:  surgery: right redo L5/S1 discetomy on 02/19/2022   Multiple lumbar surgeries, including repair of CSF leak. PAIN:  Are you having pain? Yes: NPRS scale: 0/10 Pain location: back bil LE LT>RT not beyond knee Pain description: aches Aggravating factors: standing and sitting Relieving factors: stretching   PRECAUTIONS: Back - No more than 15# lifting  WEIGHT BEARING RESTRICTIONS No  FALLS:  Has patient fallen in last 6 months? 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   Interval improvement in CSF leak arising at the laminectomy defect L5-S1 on the right extending into the  subcutaneous tissues  PATIENT SURVEYS:  Modified Oswestry 19 / 50 = 38.0 %   SCREENING FOR RED FLAGS: Bowel or bladder incontinence: No Spinal tumors: No Cauda equina syndrome: No Compression fracture: No Abdominal aneurysm:  No  COGNITION:  Overall cognitive status: Within functional limits for tasks assessed     SENSATION: WFL   LUMBAR ROM:   Active  A/PROM  eval  Flexion To distal patellae  Extension wnl  Right lateral flexion jointline  Left lateral flexion jointline  Right rotation Limited 15%  Left rotation WNL  No alteration of pain at end ranges  (Blank rows = not tested)  LOWER EXTREMITY ROM:     WFL   LOWER EXTREMITY MMT:    MMT Right eval Left eval  Hip flexion 4 4  Hip extension 4- 4+  Hip abduction 4 4  Hip adduction 4 4  Hip internal rotation    Hip external rotation 4+ 4+  Knee flexion 5 5  Knee extension 4+ 4  Ankle dorsiflexion 5 5  Ankle plantarflexion 6/10 10/10  Ankle inversion    Ankle eversion 5 5   (Blank rows = not tested)    FUNCTIONAL TESTS:  5 times sit to stand: 20.1 sec  GAIT: WNL   TODAY'S TREATMENT  06/18/22 Seated: Sit to stands no UE standard chair 10X  Piriformis stretch 3X20" each Standing:  Heel raise 20X  Hip abduction 2X10 each  Hip Extension 2X10 each  Lunges onto 4" step 2X10 each no UE  Step ups lateral and forward 2X10 each with 1 UE assist Body mechanics:  Lifting, pushing, pulling, housework demonstration and education   06/16/22 Seated:  Sit to stands no UE standard chair 10X   Piriformis stretch 3X20" each Standing:  heelraise 20X  Hip abduction 10X each  Hip extension 10X each Supine:  abdominal sets with breathing 10X5"  Bridge 10X each  SLR 10X each Prone:  POE 1 minute  Press ups 10X  Hip extensions 10X  06/13/22 Reviewed goals, educated importance of HEP compliance for maximal benefits, pt able to recall and demonstrate appropriately  Prone:  POE x 1 min  Prone press up 5x  10"  Hip extension 10x Supine: bridge 10x  Deep breathing x 1 min  Diaphragmatic breathing x 1 min  Abdominal sets x 3 minutes Seated: piriformis stretch in seated position  06/12/22 Goal and HEp review Seated:  sit to stand 10X no UE   Eval MMT, ROM 5x sit to stand Education HEP   PATIENT EDUCATION:  Education details: Findings, symptom awareness, activity modification, POC, and PT role Person educated: Patient Education method: Explanation Education comprehension: verbalized understanding   HOME EXERCISE PROGRAM: Access Code: [email or web address] URL: [email or web address] Date: 05/20/2022 Prepared by: Candie Mile 06/13/22:   Bridge and piriformis in figure 4 position Exercises - Prone on Elbows Stretch  - 3 x daily - 7 x weekly - 10 reps - 5 second hold - Supine Transversus Abdominis Bracing - Hands on Stomach  - 3 x daily - 7 x weekly - 10 reps - Supine Figure 4 Piriformis Stretch  - 3 x daily - 7 x weekly - 3 sets - 15 second hold - Supine Piriformis Stretch with Foot on Ground  - 3 x daily - 7 x weekly - 3 sets - 15 second hold  ASSESSMENT:  CLINICAL IMPRESSION: Progressed to functional strengthening activities.  Pt able to complete all standing activities without c/o pain or issues.  Pt required verbal and tactile cues to complete in correct form and keep proper count of therex. Discussed and demonstrated proper body mechanics for both work and home related activities/ADLS.  Pt was able to complete correctly with minimal cues needed.  Pt continues to progress with less pain and dysfunction.  Pt will continue to benefit from skilled therapy to reduce deficits.   OBJECTIVE IMPAIRMENTS decreased activity tolerance, decreased mobility, decreased ROM, decreased strength, hypomobility, impaired flexibility, improper body mechanics, postural dysfunction, obesity, and pain.   ACTIVITY LIMITATIONS carrying, lifting, bending, sitting, standing, toileting, reach over  head, and locomotion level  PARTICIPATION LIMITATIONS: cleaning, shopping, community activity, and occupation  PERSONAL FACTORS Past/current experiences and multiple lumbar surgeries  are also affecting patient's functional outcome.   REHAB POTENTIAL: Good  CLINICAL DECISION MAKING: Stable/uncomplicated  EVALUATION COMPLEXITY: Low   GOALS: Goals reviewed with patient? Yes  SHORT TERM GOALS: Target date: 06/10/22  Patient will be independent with initial HEP and self-management strategies to improve functional outcomes Baseline: Initiated Goal status: IN PROGRESS    LONG TERM GOALS: Target date: 07/01/22  Patient will be independent with advanced HEP and self-management strategies to improve functional outcomes Baseline:  Goal status: IN PROGRESS  2.  Patient will improve modified oswetry score by 13 points or greater to indicate improvement in functional outcomes Baseline: 19 / 50 = 38.0 % Goal status: IN PROGRESS  3.  Patient will report reduction of back pain to <3/10 for improved quality of life and ability to perform ADLs  Baseline: 7/10  Goal status: IN PROGRESS  4. Patient will have equal to or > 4+/5 MMT throughout BIL LEs to improve ability to perform functional mobility, stair ambulation and ADLs.  Baseline: See above Goal status: IN PROGRESS     5. Patient will improve 5x sit to stand <12 seconds to indicate significant improvement in functional strength.    Baseline: 20.1 sec    Goal status: IN PROGRESS  PLAN: PT FREQUENCY: 2x/week  PT DURATION: 4 weeks  PLANNED INTERVENTIONS: Therapeutic exercises, Therapeutic activity, Neuromuscular re-education, Balance training, Gait training, Patient/Family education, Self Care, Joint mobilization, Joint manipulation, Stair training, DME instructions, Dry Needling, Electrical stimulation, Spinal manipulation, Spinal mobilization, Cryotherapy, Moist heat, scar mobilization, Taping, Traction, Ultrasound, Ionotophoresis  '4mg'$ /ml Dexamethasone, Manual therapy, and Re-evaluation.  PLAN FOR NEXT SESSION: Extension preference for centralization of symptoms; Core exercises, LE strengthening, gradual restoration of lumbar ROM. Lifting mechanics for work demands.   Teena Irani, PTA/CLT [place] Ph: [phone]  06/18/2022, 4:04 PM

## 2022-06-24 ENCOUNTER — Encounter (HOSPITAL_COMMUNITY): Payer: Medicaid Other | Admitting: Physical Therapy

## 2022-06-24 ENCOUNTER — Other Ambulatory Visit: Payer: Self-pay

## 2022-06-24 ENCOUNTER — Encounter (HOSPITAL_COMMUNITY): Payer: Self-pay

## 2022-06-24 ENCOUNTER — Emergency Department (HOSPITAL_COMMUNITY)
Admission: EM | Admit: 2022-06-24 | Discharge: 2022-06-24 | Disposition: A | Discharge status: Home / Self Care | Payer: Medicaid Other | Attending: Emergency Medicine | Admitting: Emergency Medicine

## 2022-06-24 DIAGNOSIS — M5441 Lumbago with sciatica, right side: Secondary | ICD-10-CM | POA: Diagnosis not present

## 2022-06-24 DIAGNOSIS — M5431 Sciatica, right side: Secondary | ICD-10-CM

## 2022-06-24 DIAGNOSIS — Z7952 Long term (current) use of systemic steroids: Secondary | ICD-10-CM | POA: Insufficient documentation

## 2022-06-24 DIAGNOSIS — M545 Low back pain, unspecified: Secondary | ICD-10-CM | POA: Diagnosis present

## 2022-06-24 DIAGNOSIS — Z7982 Long term (current) use of aspirin: Secondary | ICD-10-CM | POA: Insufficient documentation

## 2022-06-24 DIAGNOSIS — Z7951 Long term (current) use of inhaled steroids: Secondary | ICD-10-CM | POA: Insufficient documentation

## 2022-06-24 DIAGNOSIS — J45909 Unspecified asthma, uncomplicated: Secondary | ICD-10-CM | POA: Diagnosis not present

## 2022-06-24 DIAGNOSIS — Z91040 Latex allergy status: Secondary | ICD-10-CM | POA: Insufficient documentation

## 2022-06-24 LAB — URINALYSIS, ROUTINE W REFLEX MICROSCOPIC
Bilirubin Urine: NEGATIVE
Glucose, UA: NEGATIVE mg/dL
Hgb urine dipstick: NEGATIVE
Ketones, ur: NEGATIVE mg/dL
Leukocytes,Ua: NEGATIVE
Nitrite: NEGATIVE
Protein, ur: NEGATIVE mg/dL
Specific Gravity, Urine: 1.019 (ref 1.005–1.030)
pH: 6 (ref 5.0–8.0)

## 2022-06-24 LAB — PREGNANCY, URINE: Preg Test, Ur: NEGATIVE

## 2022-06-24 MED ORDER — KETOROLAC TROMETHAMINE 30 MG/ML IJ SOLN
30.0000 mg | Freq: Once | INTRAMUSCULAR | Status: AC
  Administered 2022-06-24: 30 mg via INTRAMUSCULAR
  Filled 2022-06-24: qty 1

## 2022-06-24 MED ORDER — GABAPENTIN 300 MG PO CAPS: 300.0000 mg | ORAL_CAPSULE | Freq: Every day | ORAL | 0 refills | Status: DC

## 2022-06-24 MED ORDER — DEXAMETHASONE SODIUM PHOSPHATE 10 MG/ML IJ SOLN
10.0000 mg | Freq: Once | INTRAMUSCULAR | Status: AC
  Administered 2022-06-24: 10 mg via INTRAMUSCULAR
  Filled 2022-06-24: qty 1

## 2022-06-24 MED ORDER — METHYLPREDNISOLONE 4 MG PO TBPK: ORAL_TABLET | Freq: Every day | ORAL | 0 refills | Status: DC

## 2022-06-24 MED ORDER — HYDROCODONE-ACETAMINOPHEN 7.5-325 MG PO TABS: 1.0000 | ORAL_TABLET | Freq: Four times a day (QID) | ORAL | 0 refills | Status: DC | PRN

## 2022-06-24 NOTE — ED Triage Notes (Signed)
Pt presents to ED with complaints of left lower back pain radiating down left leg. Pt states has been going on for a while, since her back surgery but gets worse when it rains.

## 2022-06-24 NOTE — ED Provider Notes (Signed)
[facility] EMERGENCY DEPARTMENT Provider Note   CSN: [id number] Arrival date & time: 06/24/22  1245     History  Chief Complaint  Patient presents with   Back Pain    Kaylee Miller is a 31 y.o. female.  Pt is a 31 yo female with a pmhx significant for migraines, asthma, and chronic back pain.  Pt said she had been having problems with her right leg and required back surgery.  Surgery was complicated by a CSF leak that had to be repaired.  She is followed by Dr. Christella Noa (NS).  She has been going to PT.  She is out of her pain meds.  Dr. Christella Noa usually prescribes and the pain medication refill request was rejected by [facility], so she called Dr. Lacy Duverney office back, but has not heard back from his office regarding the pain meds.  She is also out of her Neurontin.  Pt is having pain today with her left leg.  Pain radiates down from her low back into her left calf.  She said the pain is worse when it is raining (which it is today).         Home Medications Prior to Admission medications   Medication Sig Start Date End Date Taking? Authorizing Provider  HYDROcodone-acetaminophen (NORCO) 7.5-325 MG tablet Take 1 tablet by mouth every 6 (six) hours as needed for moderate pain. 06/24/22  Yes Isla Pence, MD  methylPREDNISolone (MEDROL DOSEPAK) 4 MG TBPK tablet Take by mouth daily. Day 1:  2 pills at breakfast, 1 pill at lunch, 1 pill after supper, 2 pills at bedtime;Day 2:  1 pill at breakfast, 1 pill at lunch, 1 pill after supper, 2 pills at bedtime;Day 3:  1 pill at breakfast, 1 pill at lunch, 1 pill after supper, 1 pill at bedtime;Day 4:  1 pill at breakfast, 1 pill at lunch, 1 pill at bedtime;Day 5:  1 pill at breakfast, 1 pill at bedtime;Day 6:  1 pill at breakfast 06/24/22  Yes Isla Pence, MD  albuterol (VENTOLIN HFA) 108 (90 Base) MCG/ACT inhaler Inhale 2 puffs into the lungs every 6 (six) hours as needed for wheezing or shortness of breath. 06/10/20   Rolland Porter, MD   aspirin-acetaminophen-caffeine (EXCEDRIN MIGRAINE) [phone] MG tablet Take 2 tablets by mouth every 6 (six) hours as needed for migraine.    [provider]  busPIRone (BUSPAR) 5 MG tablet Take 1 tablet (5 mg total) by mouth 2 (two) times daily. 06/03/22   Estill Dooms, NP  docusate sodium (COLACE) 100 MG capsule Take 200 mg by mouth daily.    [provider]  escitalopram (LEXAPRO) 10 MG tablet Take 1 tablet (10 mg total) by mouth daily. 06/09/22   Lindell Spar, MD  fluticasone (FLONASE) 50 MCG/ACT nasal spray Place 2 sprays into both nostrils daily as needed for allergies or rhinitis.    [provider]  gabapentin (NEURONTIN) 300 MG capsule Take 1 capsule (300 mg total) by mouth at bedtime. 06/24/22   Isla Pence, MD  HYDROcodone-acetaminophen (NORCO) 7.5-325 MG tablet Take 1 tablet by mouth every 6 (six) hours as needed. 05/23/22   [provider]  medroxyPROGESTERone Acetate 150 MG/ML SUSY Inject 1 mL (150 mg total) into the muscle every 3 (three) months. INJECT 1 ML INTO THE MUSCLE EVERY 3 MONTHS Strength: 150 mg/mL 04/23/22   Derrek Monaco A, NP  Melatonin 5 MG CAPS Take 5 mg by mouth at bedtime as needed (sleep).  [provider]  naproxen (NAPROSYN) 500 MG tablet Take 1 tablet (500 mg total) by mouth 2 (two) times daily with a meal. 06/09/22   Patel, Colin Broach, MD  ofloxacin (FLOXIN OTIC) 0.3 % OTIC solution Place 5 drops into the right ear daily. 06/09/22   Lindell Spar, MD  ondansetron (ZOFRAN-ODT) 4 MG disintegrating tablet DISSOLVE 1 TABLET IN MOUTH EVERY 8 HOURS AS NEEDED FOR NAUSEA OR VOMITING 06/13/22   Derrek Monaco A, NP  tiZANidine (ZANAFLEX) 4 MG tablet Take 1 tablet (4 mg total) by mouth every 8 (eight) hours as needed for muscle spasms. 06/09/22   Lindell Spar, MD      Allergies    Latex    Review of Systems   Review of Systems  Musculoskeletal:  Positive for back pain.  All other systems reviewed  and are negative.   Physical Exam Updated Vital Signs BP 126/76 (BP Location: Left Arm)   Pulse 77   Temp 98.4 F (36.9 C) (Oral)   Resp 18   Ht '5\' 7"'$  (1.702 m)   Wt 94.3 kg   SpO2 95%   BMI 32.58 kg/m  Physical Exam Vitals and nursing note reviewed.  Constitutional:      Appearance: Normal appearance. She is obese.  HENT:     Head: Normocephalic and atraumatic.     Right Ear: External ear normal.     Left Ear: External ear normal.     Mouth/Throat:     Mouth: Mucous membranes are moist.     Pharynx: Oropharynx is clear.  Eyes:     Extraocular Movements: Extraocular movements intact.     Conjunctiva/sclera: Conjunctivae normal.     Pupils: Pupils are equal, round, and reactive to light.  Cardiovascular:     Rate and Rhythm: Normal rate and regular rhythm.     Pulses: Normal pulses.     Heart sounds: Normal heart sounds.  Pulmonary:     Effort: Pulmonary effort is normal.     Breath sounds: Normal breath sounds.  Abdominal:     General: Abdomen is flat. Bowel sounds are normal.     Palpations: Abdomen is soft.  Musculoskeletal:     Cervical back: Normal range of motion and neck supple.       Back:  Skin:    General: Skin is warm.     Capillary Refill: Capillary refill takes less than 2 seconds.  Neurological:     General: No focal deficit present.     Mental Status: She is alert and oriented to person, place, and time.  Psychiatric:        Mood and Affect: Mood normal.        Behavior: Behavior normal.     ED Results / Procedures / Treatments   Labs (all labs ordered are listed, but only abnormal results are displayed) Labs Reviewed  URINALYSIS, ROUTINE W REFLEX MICROSCOPIC - Abnormal; Notable for the following components:      Result Value   APPearance HAZY (*)    All other components within normal limits  PREGNANCY, URINE    EKG None  Radiology No results found.  Procedures Procedures    Medications Ordered in ED Medications  ketorolac  (TORADOL) 30 MG/ML injection 30 mg (30 mg Intramuscular Given 06/24/22 0931)  dexamethasone (DECADRON) injection 10 mg (10 mg Intramuscular Given 06/24/22 0930)    ED Course/ Medical Decision Making/ A&P  Medical Decision Making Amount and/or Complexity of Data Reviewed Labs: ordered.  Risk Prescription drug management.   This patient presents to the ED for concern of back pain, this involves an extensive number of treatment options, and is a complaint that carries with it a high risk of complications and morbidity.  The differential diagnosis includes sciatica, uti, kidney stone, pregnancy   Co morbidities that complicate the patient evaluation  migraines, asthma, and chronic back pain   Additional history obtained:  Additional history obtained from epic chart review    Lab Tests:  I Ordered, and personally interpreted labs.  The pertinent results include:  ua neg; preg neg   Medicines ordered and prescription drug management:  I ordered medication including decadron and toradol  for pain  Reevaluation of the patient after these medicines showed that the patient improved I have reviewed the patients home medicines and have made adjustments as needed   Critical Interventions:  Pain control  Problem List / ED Course:  Back pain:  likely due to sciatica vs spinal stenosis.  Pt knows to f/u with Dr. Christella Noa.  She is given a short rx for lortab as she is out.  She is given a refill on her gabapentin and is give a rx for a medrol dose pack.  She is to continue her PT.  She is to return if worse.    Reevaluation:  After the interventions noted above, I reevaluated the patient and found that they have :improved   Social Determinants of Health:  Lives at home   Dispostion:  After consideration of the diagnostic results and the patients response to treatment, I feel that the patent would benefit from discharge with outpatient f/u.           Final Clinical Impression(s) / ED Diagnoses Final diagnoses:  Sciatica of right side    Rx / DC Orders ED Discharge Orders          Ordered    methylPREDNISolone (MEDROL DOSEPAK) 4 MG TBPK tablet  Daily        06/24/22 0920    HYDROcodone-acetaminophen (NORCO) 7.5-325 MG tablet  Every 6 hours PRN        06/24/22 0920    gabapentin (NEURONTIN) 300 MG capsule  Daily at bedtime        06/24/22 0920              Isla Pence, MD 06/24/22 1009

## 2022-06-25 ENCOUNTER — Telehealth: Payer: Self-pay | Admitting: Internal Medicine

## 2022-06-25 ENCOUNTER — Other Ambulatory Visit: Payer: Self-pay | Admitting: *Deleted

## 2022-06-25 DIAGNOSIS — M5126 Other intervertebral disc displacement, lumbar region: Secondary | ICD-10-CM

## 2022-06-25 DIAGNOSIS — M48062 Spinal stenosis, lumbar region with neurogenic claudication: Secondary | ICD-10-CM

## 2022-06-25 NOTE — Telephone Encounter (Signed)
Referral placed for pain management ok with patient

## 2022-06-25 NOTE — Telephone Encounter (Signed)
Pt called stating that her dr @ [facility] is no longer allowed to prescribe medications to MCR/MED patients. She was seen er yesterday and given a 30supply of gabapentin and 10 hydrocodone-acetaminophen. States she is wanting to know if you can please continue prescribing these medications?    [place], [facility], [phone], can explain in more detail of what is going on

## 2022-06-26 ENCOUNTER — Ambulatory Visit (HOSPITAL_COMMUNITY): Payer: Medicaid Other | Attending: Neurosurgery | Admitting: Physical Therapy

## 2022-06-26 DIAGNOSIS — R42 Dizziness and giddiness: Secondary | ICD-10-CM | POA: Insufficient documentation

## 2022-06-26 DIAGNOSIS — M545 Low back pain, unspecified: Secondary | ICD-10-CM | POA: Insufficient documentation

## 2022-06-26 DIAGNOSIS — M6281 Muscle weakness (generalized): Secondary | ICD-10-CM | POA: Diagnosis not present

## 2022-06-26 NOTE — Therapy (Signed)
OUTPATIENT PHYSICAL THERAPY THORACOLUMBAR TREATMENT   Patient Name: Kaylee Miller MRN: [id number] DOB:02/12/1991, 31 y.o., female Today's Date: 06/26/2022   PT End of Session - 06/26/22 1609     Visit Number 5    Number of Visits 11    Date for PT Re-Evaluation 07/01/22    Authorization Type Medicaid [employer]    Authorization Time Period Auth 11 visits 9/26-12/24 with 27 visits combined therapies per year.    Authorization - Visit Number 5    Authorization - Number of Visits 11    Progress Note Due on Visit 10    PT Start Time 2706    PT Stop Time 2376    PT Time Calculation (min) 40 min    Activity Tolerance Patient tolerated treatment well;No increased pain    Behavior During Therapy [facility] for tasks assessed/performed             Past Medical History:  Diagnosis Date   Anemia    Anxiety    Asthma    [employer] 07/02/2020   Contraceptive management 02/08/2015   COVID    mild case   CSF leak 01/29/2022   Depression    GERD (gastroesophageal reflux disease)    Headache(784.0)    migraines   History of bronchitis    HSV-2 (herpes simplex virus 2) infection    Pneumonia    Pregnant    S/P C-section 07/28/2013   Sciatic nerve pain    Seasonal allergies    Vaginal Pap smear, abnormal    Past Surgical History:  Procedure Laterality Date   adenoids     BACK SURGERY     CESAREAN SECTION  08/25/2005   CESAREAN SECTION N/A 07/28/2013   Procedure: CESAREAN SECTION;  Surgeon: Jonnie Kind, MD;  Location: [facility];  Service: Obstetrics;  Laterality: N/A;   LUMBAR LAMINECTOMY/DECOMPRESSION MICRODISCECTOMY Right 05/25/2021   Procedure: DISCECTOMY Lumbar five - Sacral one;  Surgeon: Ashok Pall, MD;  Location: [place];  Service: Neurosurgery;  Laterality: Right;   LUMBAR LAMINECTOMY/DECOMPRESSION MICRODISCECTOMY Right 01/15/2022   Procedure: Right Lumbar Five-Sacral One Microdiscectomy;  Surgeon: Ashok Pall, MD;  Location: [place];  Service: Neurosurgery;   Laterality: Right;  3C/RM 19   LUMBAR LAMINECTOMY/DECOMPRESSION MICRODISCECTOMY N/A 01/30/2022   Procedure: REVISION L5-S1 LAMINECTOMY REPAIR CSS LEAK;  Surgeon: Dawley, Theodoro Doing, DO;  Location: [place];  Service: Neurosurgery;  Laterality: N/A;   LUMBAR LAMINECTOMY/DECOMPRESSION MICRODISCECTOMY N/A 02/19/2022   Procedure: Redo Lumbar five-Sacral one Microdiscectomy;  Surgeon: Ashok Pall, MD;  Location: [place];  Service: Neurosurgery;  Laterality: N/A;   tubes in ears     Patient Active Problem List   Diagnosis Date Noted   Depo-Provera contraceptive status 04/23/2022   Hordeolum externum of right upper eyelid 04/01/2022   Hospital discharge follow-up 03/06/2022   HNP (herniated nucleus pulposus), lumbar 01/15/2022   Anxiety and depression 10/29/2021   Leg swelling 09/13/2021   Body mass index (BMI) 30.0-30.9, adult 08/27/2021   Herniated nucleus pulposus, L5-S1, right 05/24/2021   Right hip pain 04/05/2021   Spinal stenosis of lumbar region with neurogenic claudication 12/19/2020   Disc displacement, lumbar 11/12/2020   Essential (primary) hypertension 11/12/2020   ASCUS of cervix with negative high risk HPV 09/21/2020   Mild asthma without complication 28/31/5176   Tobacco abuse 07/03/2020   Migraines 01/05/2013    PCP: Ihor Dow, MD  REFERRING PROVIDER: Ashok Pall, MD   REFERRING DIAG: PT eval/tx for M51.26 disc displacement, lumbar  Rationale for Evaluation and Treatment Rehabilitation  THERAPY DIAG:  Low back pain, unspecified back pain laterality, unspecified chronicity, unspecified whether sciatica present  Muscle weakness (generalized)  ONSET DATE: Most recent surgery 02/19/22 Initial onset of back pain with RLE radiculopathy in Feb of 2022  SUBJECTIVE:                                                                                                                                                                                           SUBJECTIVE STATEMENT: Pt  states she went to ED Tuesday night (2 nights ago) as her pain got up to 8/10 and she was out of meds.  States they gave her a steroid shot, tramadol and put her on a predisone dosing pack.  States they also gave her gabapentin.  States the pain is still up today at 6/10. Thinks it may be due to the weather getting colder.   Evaluation:Patient reports back pain with radiculopathy into RLE since Feb of last year. Since then has has several complications and little resolution of symptoms. 4 surgeries total reported. Most recently L5-S1 discetomy on June 28th of 2023. Saw Dr. Christella Noa last Monday for follow-up. States RLE feels weak and pain is improving but still present. Reports she was working Thursday the 14, pulling down heavy pallets/bundles down from a shelf and developed pain down into her LLE similar to past experience and symptoms of the RLE. Estimates load to be about 45#. The rest of the day she was having to bend over and tear paper from a spool. States Dr. Christella Noa plans to get an MRI.   PERTINENT HISTORY:  surgery: right redo L5/S1 discetomy on 02/19/2022   Multiple lumbar surgeries, including repair of CSF leak. PAIN:  Are you having pain? Yes: NPRS scale: 0/10 Pain location: back bil LE LT>RT not beyond knee Pain description: aches Aggravating factors: standing and sitting Relieving factors: stretching   PRECAUTIONS: Back - No more than 15# lifting  WEIGHT BEARING RESTRICTIONS No  FALLS:  Has patient fallen in last 6 months? No  LIVING ENVIRONMENT: Lives with: lives with their family children (oldest 99) Lives in: House/apartment Stairs: Yes: External: 3 steps; none Has following equipment at home: Single point cane, Walker - 2 wheeled, shower chair, and bed side commode  OCCUPATION: full time employee; Programmer, systems for [employer]  PLOF: Independent  PATIENT GOALS relieve pain   OBJECTIVE:   DIAGNOSTIC FINDINGS:  IMPRESSION: Right laminectomy L5-S1.  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   Interval improvement in CSF leak  arising at the laminectomy defect L5-S1 on the right extending into the subcutaneous tissues  PATIENT SURVEYS:  Modified Oswestry 19 / 50 = 38.0 %   SCREENING FOR RED FLAGS: Bowel or bladder incontinence: No Spinal tumors: No Cauda equina syndrome: No Compression fracture: No Abdominal aneurysm: No  COGNITION:  Overall cognitive status: Within functional limits for tasks assessed     SENSATION: WFL   LUMBAR ROM:   Active  A/PROM  eval  Flexion To distal patellae  Extension wnl  Right lateral flexion jointline  Left lateral flexion jointline  Right rotation Limited 15%  Left rotation WNL  No alteration of pain at end ranges  (Blank rows = not tested)  LOWER EXTREMITY ROM:     WFL   LOWER EXTREMITY MMT:    MMT Right eval Left eval  Hip flexion 4 4  Hip extension 4- 4+  Hip abduction 4 4  Hip adduction 4 4  Hip internal rotation    Hip external rotation 4+ 4+  Knee flexion 5 5  Knee extension 4+ 4  Ankle dorsiflexion 5 5  Ankle plantarflexion 6/10 10/10  Ankle inversion    Ankle eversion 5 5   (Blank rows = not tested)    FUNCTIONAL TESTS:  5 times sit to stand: 20.1 sec  GAIT: WNL   TODAY'S TREATMENT  06/26/22 Seated:  sit to stands no UE from standard chair 10X  Piriformis stretch 60" each LE X 2   Prone: POE x 1 min  Prone press up 5x 10"  Hip extension 10x Supine: bridge 10x  SKTC bil 3X20"  LTR 5X10" each side  06/18/22 Seated: Sit to stands no UE standard chair 10X  Piriformis stretch 3X20" each Standing:  Heel raise 20X  Hip abduction 2X10 each  Hip Extension 2X10 each  Lunges onto 4" step 2X10 each no UE  Step ups lateral and forward 2X10 each with 1 UE assist Body mechanics:  Lifting, pushing, pulling, housework demonstration and education   06/16/22 Seated:  Sit to  stands no UE standard chair 10X   Piriformis stretch 3X20" each Standing:  heelraise 20X  Hip abduction 10X each  Hip extension 10X each Supine:  abdominal sets with breathing 10X5"  Bridge 10X each  SLR 10X each Prone:  POE 1 minute  Press ups 10X  Hip extensions 10X  06/13/22 Reviewed goals, educated importance of HEP compliance for maximal benefits, pt able to recall and demonstrate appropriately  Prone:  POE x 1 min  Prone press up 5x 10"  Hip extension 10x Supine: bridge 10x  Deep breathing x 1 min  Diaphragmatic breathing x 1 min  Abdominal sets x 3 minutes Seated: piriformis stretch in seated position  06/12/22 Goal and HEp review Seated:  sit to stand 10X no UE   Eval MMT, ROM 5x sit to stand Education HEP   PATIENT EDUCATION:  Education details: Findings, symptom awareness, activity modification, POC, and PT role Person educated: Patient Education method: Explanation Education comprehension: verbalized understanding   HOME EXERCISE PROGRAM: Access Code: [email or web address] URL: [email or web address] Date: 05/20/2022 Prepared by: Candie Mile 06/13/22:   Bridge and piriformis in figure 4 position Exercises - Prone on Elbows Stretch  - 3 x daily - 7 x weekly - 10 reps - 5 second hold - Supine Transversus Abdominis Bracing - Hands on Stomach  - 3 x daily - 7 x weekly - 10 reps - Supine Figure 4 Piriformis Stretch  - 3 x daily -  7 x weekly - 3 sets - 15 second hold - Supine Piriformis Stretch with Foot on Ground  - 3 x daily - 7 x weekly - 3 sets - 15 second hold  ASSESSMENT:  CLINICAL IMPRESSION: Focused mainly on established stretches and some strengthening as well due to elevated pain levels.  Continued need for verbal and tactile cues to complete in correct form and keep proper count of therex.  Pt displayed good mobility as far as transitioning from standing/sitting without pain behaviors during session.  No pain reported during session and no  change in pain reported at end of session.  Pt will continue to benefit from skilled therapy to reduce deficits.   OBJECTIVE IMPAIRMENTS decreased activity tolerance, decreased mobility, decreased ROM, decreased strength, hypomobility, impaired flexibility, improper body mechanics, postural dysfunction, obesity, and pain.   ACTIVITY LIMITATIONS carrying, lifting, bending, sitting, standing, toileting, reach over head, and locomotion level  PARTICIPATION LIMITATIONS: cleaning, shopping, community activity, and occupation  PERSONAL FACTORS Past/current experiences and multiple lumbar surgeries  are also affecting patient's functional outcome.   REHAB POTENTIAL: Good  CLINICAL DECISION MAKING: Stable/uncomplicated  EVALUATION COMPLEXITY: Low   GOALS: Goals reviewed with patient? Yes  SHORT TERM GOALS: Target date: 06/10/22  Patient will be independent with initial HEP and self-management strategies to improve functional outcomes Baseline: Initiated Goal status: IN PROGRESS    LONG TERM GOALS: Target date: 07/01/22  Patient will be independent with advanced HEP and self-management strategies to improve functional outcomes Baseline:  Goal status: IN PROGRESS  2.  Patient will improve modified oswetry score by 13 points or greater to indicate improvement in functional outcomes Baseline: 19 / 50 = 38.0 % Goal status: IN PROGRESS  3.  Patient will report reduction of back pain to <3/10 for improved quality of life and ability to perform ADLs  Baseline: 7/10  Goal status: IN PROGRESS  4. Patient will have equal to or > 4+/5 MMT throughout BIL LEs to improve ability to perform functional mobility, stair ambulation and ADLs.  Baseline: See above Goal status: IN PROGRESS     5. Patient will improve 5x sit to stand <12 seconds to indicate significant improvement in functional strength.    Baseline: 20.1 sec    Goal status: IN PROGRESS  PLAN: PT FREQUENCY: 2x/week  PT DURATION: 4  weeks  PLANNED INTERVENTIONS: Therapeutic exercises, Therapeutic activity, Neuromuscular re-education, Balance training, Gait training, Patient/Family education, Self Care, Joint mobilization, Joint manipulation, Stair training, DME instructions, Dry Needling, Electrical stimulation, Spinal manipulation, Spinal mobilization, Cryotherapy, Moist heat, scar mobilization, Taping, Traction, Ultrasound, Ionotophoresis '4mg'$ /ml Dexamethasone, Manual therapy, and Re-evaluation.  PLAN FOR NEXT SESSION: Extension preference for centralization of symptoms; Core exercises, LE strengthening, gradual restoration of lumbar ROM. Lifting mechanics for work demands.   Attempt to add postural/core activities with thera band including paloff next session.   Teena Irani, PTA/CLT [place] Ph: [phone]  06/26/2022, 4:53 PM

## 2022-07-01 ENCOUNTER — Ambulatory Visit (HOSPITAL_COMMUNITY): Payer: Medicaid Other | Admitting: Physical Therapy

## 2022-07-01 DIAGNOSIS — R42 Dizziness and giddiness: Secondary | ICD-10-CM

## 2022-07-01 DIAGNOSIS — M6281 Muscle weakness (generalized): Secondary | ICD-10-CM

## 2022-07-01 DIAGNOSIS — M545 Low back pain, unspecified: Secondary | ICD-10-CM | POA: Diagnosis not present

## 2022-07-01 NOTE — Therapy (Addendum)
OUTPATIENT PHYSICAL THERAPY THORACOLUMBAR TREATMENT Progress Note Reporting Period 05/20/22 to 07/01/22  See note below for Objective Data and Assessment of Progress/Goals.    Patient Name: Kaylee Miller MRN: [id number] DOB:[birth year], 31 y.o., female Today's Date: 07/01/2022   PT End of Session - 07/01/22 1040     Visit Number 6    Number of Visits 11    Date for PT Re-Evaluation 07/24/22    Authorization Type Medicaid [employer]    Authorization Time Period Auth 11 visits 9/26-12/24 with 27 visits combined therapies per year.    Authorization - Visit Number 6    Authorization - Number of Visits 11    Progress Note Due on Visit 10    PT Start Time 1036    PT Stop Time 1116    PT Time Calculation (min) 40 min    Activity Tolerance Patient tolerated treatment well;No increased pain    Behavior During Therapy [facility] for tasks assessed/performed             Past Medical History:  Diagnosis Date   Anemia    Anxiety    Asthma    Phreesia 07/02/2020   Contraceptive management 02/08/2015   COVID    mild case   CSF leak 01/29/2022   Depression    GERD (gastroesophageal reflux disease)    Headache(784.0)    migraines   History of bronchitis    HSV-2 (herpes simplex virus 2) infection    Pneumonia    Pregnant    S/P C-section 07/28/2013   Sciatic nerve pain    Seasonal allergies    Vaginal Pap smear, abnormal    Past Surgical History:  Procedure Laterality Date   adenoids     BACK SURGERY     CESAREAN SECTION  08/25/2005   CESAREAN SECTION N/A 07/28/2013   Procedure: CESAREAN SECTION;  Surgeon: Jonnie Kind, MD;  Location: [facility];  Service: Obstetrics;  Laterality: N/A;   LUMBAR LAMINECTOMY/DECOMPRESSION MICRODISCECTOMY Right 05/25/2021   Procedure: DISCECTOMY Lumbar five - Sacral one;  Surgeon: Ashok Pall, MD;  Location: [place];  Service: Neurosurgery;  Laterality: Right;   LUMBAR LAMINECTOMY/DECOMPRESSION MICRODISCECTOMY Right 01/15/2022   Procedure:  Right Lumbar Five-Sacral One Microdiscectomy;  Surgeon: Ashok Pall, MD;  Location: [place];  Service: Neurosurgery;  Laterality: Right;  3C/RM 19   LUMBAR LAMINECTOMY/DECOMPRESSION MICRODISCECTOMY N/A 01/30/2022   Procedure: REVISION L5-S1 LAMINECTOMY REPAIR CSS LEAK;  Surgeon: Dawley, Theodoro Doing, DO;  Location: [place];  Service: Neurosurgery;  Laterality: N/A;   LUMBAR LAMINECTOMY/DECOMPRESSION MICRODISCECTOMY N/A 02/19/2022   Procedure: Redo Lumbar five-Sacral one Microdiscectomy;  Surgeon: Ashok Pall, MD;  Location: [place];  Service: Neurosurgery;  Laterality: N/A;   tubes in ears     Patient Active Problem List   Diagnosis Date Noted   Depo-Provera contraceptive status 04/23/2022   Hordeolum externum of right upper eyelid 04/01/2022   Hospital discharge follow-up 03/06/2022   HNP (herniated nucleus pulposus), lumbar 01/15/2022   Anxiety and depression 10/29/2021   Leg swelling 09/13/2021   Body mass index (BMI) 30.0-30.9, adult 08/27/2021   Herniated nucleus pulposus, L5-S1, right 05/24/2021   Right hip pain 04/05/2021   Spinal stenosis of lumbar region with neurogenic claudication 12/19/2020   Disc displacement, lumbar 11/12/2020   Essential (primary) hypertension 11/12/2020   ASCUS of cervix with negative high risk HPV 09/21/2020   Mild asthma without complication 26/33/3545   Tobacco abuse 07/03/2020   Migraines 01/05/2013    PCP: Ihor Dow, MD  REFERRING PROVIDER: Ashok Pall, MD   REFERRING DIAG: PT eval/tx for M51.26 disc displacement, lumbar   Rationale for Evaluation and Treatment Rehabilitation  THERAPY DIAG:  Low back pain, unspecified back pain laterality, unspecified chronicity, unspecified whether sciatica present  Muscle weakness (generalized)  Dizziness and giddiness  ONSET DATE: Most recent surgery 02/19/22 Initial onset of back pain with RLE radiculopathy in Feb of 2022  SUBJECTIVE:                                                                                                                                                                                            SUBJECTIVE STATEMENT: Pt states she is suppose to be referred to a pain management MD but hasn't heard yet.  States her pain is still high at 8/10 in her lower back.  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   Evaluation:Patient reports back pain with radiculopathy into RLE since Feb of last year. Since then has has several complications and little resolution of symptoms. 4 surgeries total reported. Most recently L5-S1 discetomy on June 28th of 2023. Saw Dr. Christella Noa last Monday for follow-up. States RLE feels weak and pain is improving but still present. Reports she was working Thursday the 14, pulling down heavy pallets/bundles down from a shelf and developed pain down into her LLE similar to past experience and symptoms of the RLE. Estimates load to be about 45#. The rest of the day she was having to bend over and tear paper from a spool. States Dr. Christella Noa plans to get an MRI.   PERTINENT HISTORY:  surgery: right redo L5/S1 discetomy on 02/19/2022   Multiple lumbar surgeries, including repair of CSF leak. PAIN:  Are you having pain? Yes: NPRS scale: 0/10 Pain location: back bil LE LT>RT not beyond knee Pain description: aches Aggravating factors: standing and sitting Relieving factors: stretching   PRECAUTIONS: Back - No more than 15# lifting  WEIGHT BEARING RESTRICTIONS No  FALLS:  Has patient fallen in last 6 months? No  LIVING ENVIRONMENT: Lives with: lives with their family children (oldest 72) Lives in: House/apartment Stairs: Yes: External: 3 steps; none Has following equipment at home: Single point cane, Walker - 2 wheeled, shower chair, and bed side commode  OCCUPATION: full time employee; Programmer, systems for [employer]  PLOF: Independent  PATIENT GOALS relieve pain   OBJECTIVE:   DIAGNOSTIC FINDINGS:   IMPRESSION: Right laminectomy L5-S1. 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   Interval improvement in CSF leak arising  at the laminectomy defect L5-S1 on the right extending into the subcutaneous tissues  PATIENT SURVEYS:  Modified Oswestry 11/7:  14/50=28%;  at evaluation: 19 / 50 = 38.0 %   SCREENING FOR RED FLAGS: Bowel or bladder incontinence: No Spinal tumors: No Cauda equina syndrome: No Compression fracture: No Abdominal aneurysm: No  COGNITION:  Overall cognitive status: Within functional limits for tasks assessed     SENSATION: WFL   LUMBAR ROM:   Active  A/PROM  eval A/PROM 07/01/22  Flexion To distal patellae Proximal shin  Extension wnl WNL  Right lateral flexion jointline Distal patella  Left lateral flexion jointline Distal patella with pain  Right rotation Limited 15% WNL  Left rotation WNL WNL  No alteration of pain at end ranges  (Blank rows = not tested)  LOWER EXTREMITY ROM:     WFL   LOWER EXTREMITY MMT:    MMT Right eval Left eval Right 07/01/22 Left 07/01/22  Hip flexion 4 4 4+ 4  Hip extension 4- 4+ 4 4+  Hip abduction _0 Hip adduction _1 Hip internal rotation      Hip external rotation 4+ 4+ 5 5  Knee flexion 5 5    Knee extension 4+ 4 5 4+  Ankle dorsiflexion 5 5    Ankle plantarflexion 6/10 10/10 10/10   Ankle inversion      Ankle eversion 5 5     (Blank rows = not tested)    FUNCTIONAL TESTS:  5 times sit to stand: 11/7:  20.5 sec;  evaluation: 20.1 sec  GAIT: WNL   TODAY'S TREATMENT  07/01/22 Progress note completed Oswestry: 14/50=28% (was 38%) 5X sit to stand: 20.5 sec (was 20.1 sec) pain behaviors noted today MMT and ROM (see above) Review of goals and POC moving forward  06/26/22 Seated:  sit to stands no UE from standard chair 10X  Piriformis stretch 60" each LE X 2   Prone: POE x 1 min  Prone press up 5x  10"  Hip extension 10x Supine: bridge 10x  SKTC bil 3X20"  LTR 5X10" each side  06/18/22 Seated: Sit to stands no UE standard chair 10X  Piriformis stretch 3X20" each Standing:  Heel raise 20X  Hip abduction 2X10 each  Hip Extension 2X10 each  Lunges onto 4" step 2X10 each no UE  Step ups lateral and forward 2X10 each with 1 UE assist Body mechanics:  Lifting, pushing, pulling, housework demonstration and education   06/16/22 Seated:  Sit to stands no UE standard chair 10X   Piriformis stretch 3X20" each Standing:  heelraise 20X  Hip abduction 10X each  Hip extension 10X each Supine:  abdominal sets with breathing 10X5"  Bridge 10X each  SLR 10X each Prone:  POE 1 minute  Press ups 10X  Hip extensions 10X  06/13/22 Reviewed goals, educated importance of HEP compliance for maximal benefits, pt able to recall and demonstrate appropriately  Prone:  POE x 1 min  Prone press up 5x 10"  Hip extension 10x Supine: bridge 10x  Deep breathing x 1 min  Diaphragmatic breathing x 1 min  Abdominal sets x 3 minutes Seated: piriformis stretch in seated position  06/12/22 Goal and HEp review Seated:  sit to stand 10X no UE   Eval MMT, ROM 5x sit to stand Education HEP   PATIENT EDUCATION:  Education details: Findings, symptom awareness, activity modification, POC, and PT role Person educated: Patient Education method:  Explanation Education comprehension: verbalized understanding   HOME EXERCISE PROGRAM: Access Code: [email or web address] URL: [email or web address] Date: 05/20/2022 Prepared by: Candie Mile 06/13/22:   Bridge and piriformis in figure 4 position Exercises - Prone on Elbows Stretch  - 3 x daily - 7 x weekly - 10 reps - 5 second hold - Supine Transversus Abdominis Bracing - Hands on Stomach  - 3 x daily - 7 x weekly - 10 reps - Supine Figure 4 Piriformis Stretch  - 3 x daily - 7 x weekly - 3 sets - 15 second hold - Supine Piriformis Stretch with  Foot on Ground  - 3 x daily - 7 x weekly - 3 sets - 15 second hold  ASSESSMENT:  CLINICAL IMPRESSION: Progress note completed this session as cert has expired.  Pt has 5 more visits approved remaining.  PT reports her insurance will change as she is no longer eligable for medicaid.  Pt has made progress with improved oswestry score, ROM and LE strength.  Pt has met STG but has not met any of her LTG's.  Pain level remains high despite functional improvements and various medications/steroids with anticipated pain management appt. Soon. Pt reports compliance with HEP and remains employed full time.    Continued need for verbal and tactile cues to complete in correct form and keep proper count of therex.  Pt will continue to benefit from skilled therapy to reduce deficits and progress towards unmet goals.  Agreed with the above information; patient still progressing towards long term goals and may benefit from further skilled physical therapy to address functional deficits and improve mobility with reduced pain. Ellouise Newer, Pt, DPT  OBJECTIVE IMPAIRMENTS decreased activity tolerance, decreased mobility, decreased ROM, decreased strength, hypomobility, impaired flexibility, improper body mechanics, postural dysfunction, obesity, and pain.   ACTIVITY LIMITATIONS carrying, lifting, bending, sitting, standing, toileting, reach over head, and locomotion level  PARTICIPATION LIMITATIONS: cleaning, shopping, community activity, and occupation  PERSONAL FACTORS Past/current experiences and multiple lumbar surgeries  are also affecting patient's functional outcome.   REHAB POTENTIAL: Good  CLINICAL DECISION MAKING: Stable/uncomplicated  EVALUATION COMPLEXITY: Low   GOALS: Goals reviewed with patient? Yes  SHORT TERM GOALS: Target date: 06/10/22  Patient will be independent with initial HEP and self-management strategies to improve functional outcomes Baseline: Initiated Goal status: MET     LONG TERM GOALS: Target date: 07/01/22  Patient will be independent with advanced HEP and self-management strategies to improve functional outcomes Baseline:  Goal status: IN PROGRESS  2.  Patient will improve modified oswetry score by 13 points or greater to indicate improvement in functional outcomes Baseline: 19 / 50 = 38.0 %; 11/7 14/50=28% Goal status: IN PROGRESS  3.  Patient will report reduction of back pain to <3/10 for improved quality of life and ability to perform ADLs  Baseline: 7/10  Goal status: IN PROGRESS  4. Patient will have equal to or > 4+/5 MMT throughout BIL LEs to improve ability to perform functional mobility, stair ambulation and ADLs.  Baseline: See above Goal status: IN PROGRESS Partly met (all but glutes)    5. Patient will improve 5x sit to stand <12 seconds to indicate significant improvement in functional strength.    Baseline: 20.1 sec; 11/7 20.5 sec    Goal status: IN PROGRESS  PLAN: PT FREQUENCY: 2x/week  PT DURATION: 4 weeks  PLANNED INTERVENTIONS: Therapeutic exercises, Therapeutic activity, Neuromuscular re-education, Balance training, Gait training, Patient/Family education, Self Care, Joint mobilization, Joint manipulation, Stair training,  DME instructions, Dry Needling, Electrical stimulation, Spinal manipulation, Spinal mobilization, Cryotherapy, Moist heat, scar mobilization, Taping, Traction, Ultrasound, Ionotophoresis 6m/ml Dexamethasone, Manual therapy, and Re-evaluation.  PLAN FOR NEXT SESSION: Continue remaining visits approved (5 more) with focus on increasing core and LE strength and reduction of symptoms.  Next session add paloff. Update HEP.  ATeena Irani PTA/CLT CWinnebagoPh: [phone] 07/01/2022, 11:23 AM    Co-sign; addendum: 07/01/22 1[place] PT, DPT Physical Therapist [facility]

## 2022-07-01 NOTE — Addendum Note (Signed)
Addended by: Ellouise Newer on: 07/01/2022 02:33 PM   Modules accepted: Orders

## 2022-07-03 ENCOUNTER — Ambulatory Visit (HOSPITAL_COMMUNITY): Payer: Medicaid Other

## 2022-07-03 ENCOUNTER — Encounter (HOSPITAL_COMMUNITY): Payer: Self-pay

## 2022-07-03 DIAGNOSIS — M545 Low back pain, unspecified: Secondary | ICD-10-CM | POA: Diagnosis not present

## 2022-07-03 DIAGNOSIS — M6281 Muscle weakness (generalized): Secondary | ICD-10-CM | POA: Diagnosis not present

## 2022-07-03 DIAGNOSIS — R42 Dizziness and giddiness: Secondary | ICD-10-CM | POA: Diagnosis not present

## 2022-07-03 NOTE — Therapy (Signed)
OUTPATIENT PHYSICAL THERAPY THORACOLUMBAR TREATMENT  Patient Name: Kaylee Miller MRN: [id number] DOB:Nov 20, 1990, 31 y.o., female Today's Date: 07/03/2022   PT End of Session - 07/03/22 1654     Visit Number 7    Number of Visits 11    Date for PT Re-Evaluation 07/24/22    Authorization Type Medicaid [employer]    Authorization Time Period Auth 11 visits 9/26-12/24 with 27 visits combined therapies per year.    Authorization - Visit Number 7    Authorization - Number of Visits 11    Progress Note Due on Visit 10    PT Start Time [id number]             Past Medical History:  Diagnosis Date   Anemia    Anxiety    Asthma    Phreesia 07/02/2020   Contraceptive management 02/08/2015   COVID    mild case   CSF leak 01/29/2022   Depression    GERD (gastroesophageal reflux disease)    Headache(784.0)    migraines   History of bronchitis    HSV-2 (herpes simplex virus 2) infection    Pneumonia    Pregnant    S/P C-section 07/28/2013   Sciatic nerve pain    Seasonal allergies    Vaginal Pap smear, abnormal    Past Surgical History:  Procedure Laterality Date   adenoids     BACK SURGERY     CESAREAN SECTION  08/25/2005   CESAREAN SECTION N/A 07/28/2013   Procedure: CESAREAN SECTION;  Surgeon: Jonnie Kind, MD;  Location: [facility];  Service: Obstetrics;  Laterality: N/A;   LUMBAR LAMINECTOMY/DECOMPRESSION MICRODISCECTOMY Right 05/25/2021   Procedure: DISCECTOMY Lumbar five - Sacral one;  Surgeon: Ashok Pall, MD;  Location: [place];  Service: Neurosurgery;  Laterality: Right;   LUMBAR LAMINECTOMY/DECOMPRESSION MICRODISCECTOMY Right 01/15/2022   Procedure: Right Lumbar Five-Sacral One Microdiscectomy;  Surgeon: Ashok Pall, MD;  Location: [place];  Service: Neurosurgery;  Laterality: Right;  3C/RM 19   LUMBAR LAMINECTOMY/DECOMPRESSION MICRODISCECTOMY N/A 01/30/2022   Procedure: REVISION L5-S1 LAMINECTOMY REPAIR CSS LEAK;  Surgeon: Dawley, Theodoro Doing, DO;  Location: [place];   Service: Neurosurgery;  Laterality: N/A;   LUMBAR LAMINECTOMY/DECOMPRESSION MICRODISCECTOMY N/A 02/19/2022   Procedure: Redo Lumbar five-Sacral one Microdiscectomy;  Surgeon: Ashok Pall, MD;  Location: [place];  Service: Neurosurgery;  Laterality: N/A;   tubes in ears     Patient Active Problem List   Diagnosis Date Noted   Depo-Provera contraceptive status 04/23/2022   Hordeolum externum of right upper eyelid 04/01/2022   Hospital discharge follow-up 03/06/2022   HNP (herniated nucleus pulposus), lumbar 01/15/2022   Anxiety and depression 10/29/2021   Leg swelling 09/13/2021   Body mass index (BMI) 30.0-30.9, adult 08/27/2021   Herniated nucleus pulposus, L5-S1, right 05/24/2021   Right hip pain 04/05/2021   Spinal stenosis of lumbar region with neurogenic claudication 12/19/2020   Disc displacement, lumbar 11/12/2020   Essential (primary) hypertension 11/12/2020   ASCUS of cervix with negative high risk HPV 09/21/2020   Mild asthma without complication 93/81/0175   Tobacco abuse 07/03/2020   Migraines 01/05/2013    PCP: Ihor Dow, MD  REFERRING PROVIDER: Ashok Pall, MD   REFERRING DIAG: PT eval/tx for M51.26 disc displacement, lumbar   Rationale for Evaluation and Treatment Rehabilitation  THERAPY DIAG:  No diagnosis found.  ONSET DATE: Most recent surgery 02/19/22 Initial onset of back pain with RLE radiculopathy in Feb of 2022  SUBJECTIVE:  SUBJECTIVE STATEMENT: 07/03/22:  Pt stated she has throbbing pain in Lt>Rt buttocks, pain scale 6/10.  Reports relief with extension based exercises and hip strengthening.    Evaluation:Patient reports back pain with radiculopathy into RLE since Feb of last year. Since then has has several complications and little resolution of symptoms. 4  surgeries total reported. Most recently L5-S1 discetomy on June 28th of 2023. Saw Dr. Christella Noa last Monday for follow-up. States RLE feels weak and pain is improving but still present. Reports she was working Thursday the 14, pulling down heavy pallets/bundles down from a shelf and developed pain down into her LLE similar to past experience and symptoms of the RLE. Estimates load to be about 45#. The rest of the day she was having to bend over and tear paper from a spool. States Dr. Christella Noa plans to get an MRI.   PERTINENT HISTORY:  surgery: right redo L5/S1 discetomy on 02/19/2022   Multiple lumbar surgeries, including repair of CSF leak. PAIN:  Are you having pain? Yes: NPRS scale: 0/10 Pain location: back bil LE LT>RT not beyond knee Pain description: aches Aggravating factors: standing and sitting Relieving factors: stretching   PRECAUTIONS: Back - No more than 15# lifting  WEIGHT BEARING RESTRICTIONS No  FALLS:  Has patient fallen in last 6 months? No  LIVING ENVIRONMENT: Lives with: lives with their family children (oldest 73) Lives in: House/apartment Stairs: Yes: External: 3 steps; none Has following equipment at home: Single point cane, Walker - 2 wheeled, shower chair, and bed side commode  OCCUPATION: full time employee; Programmer, systems for [employer]  PLOF: Independent  PATIENT GOALS relieve pain   OBJECTIVE:   DIAGNOSTIC FINDINGS:  IMPRESSION: Right laminectomy L5-S1. 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   Interval improvement in CSF leak arising at the laminectomy defect L5-S1 on the right extending into the subcutaneous tissues  PATIENT SURVEYS:  Modified Oswestry 11/7:  14/50=28%;  at evaluation: 45 / 50 = 38.0 %   SCREENING FOR RED FLAGS: Bowel or bladder incontinence: No Spinal tumors: No Cauda equina syndrome: No Compression fracture: No Abdominal aneurysm:  No  COGNITION:  Overall cognitive status: Within functional limits for tasks assessed     SENSATION: WFL   LUMBAR ROM:   Active  A/PROM  eval A/PROM 07/01/22  Flexion To distal patellae Proximal shin  Extension wnl WNL  Right lateral flexion jointline Distal patella  Left lateral flexion jointline Distal patella with pain  Right rotation Limited 15% WNL  Left rotation WNL WNL  No alteration of pain at end ranges  (Blank rows = not tested)  LOWER EXTREMITY ROM:     [facility]   LOWER EXTREMITY MMT:    MMT Right eval Left eval Right 07/01/22 Left 07/01/22  Hip flexion 4 4 4+ 4  Hip extension 4- 4+ 4 4+  Hip abduction _0 Hip adduction _1 Hip internal rotation      Hip external rotation 4+ 4+ 5 5  Knee flexion 5 5    Knee extension 4+ 4 5 4+  Ankle dorsiflexion 5 5    Ankle plantarflexion 6/10 10/10 10/10   Ankle inversion      Ankle eversion 5 5     (Blank rows = not tested)    FUNCTIONAL TESTS:  5 times sit to stand: 11/7:  20.5 sec;  evaluation: 20.1 sec  GAIT: WNL   TODAY'S TREATMENT  07/03/22: 3D hip excursion 10x Pallof 15x with GTB x 2 Prone: POE x 1 min Palpation to sacum/Lt PSIS with reports of some relief with pressure Supine: Bridge 2 sets 5 reps 5" holds Alternating Abd/add with ball and belt 10x 5" Hamstring stretch 3x 30" Standing gastroc stretch 3x 30"   07/01/22 Progress note completed Oswestry: 14/50=28% (was 38%) 5X sit to stand: 20.5 sec (was 20.1 sec) pain behaviors noted today MMT and ROM (see above) Review of goals and POC moving forward  06/26/22 Seated:  sit to stands no UE from standard chair 10X  Piriformis stretch 60" each LE X 2   Prone: POE x 1 min  Prone press up 5x 10"  Hip extension 10x Supine: bridge 10x  SKTC bil 3X20"  LTR 5X10" each side  06/18/22 Seated: Sit to stands no UE standard chair 10X  Piriformis stretch 3X20" each Standing:  Heel raise 20X  Hip abduction 2X10 each  Hip Extension 2X10  each  Lunges onto 4" step 2X10 each no UE  Step ups lateral and forward 2X10 each with 1 UE assist Body mechanics:  Lifting, pushing, pulling, housework demonstration and education   06/16/22 Seated:  Sit to stands no UE standard chair 10X   Piriformis stretch 3X20" each Standing:  heelraise 20X  Hip abduction 10X each  Hip extension 10X each Supine:  abdominal sets with breathing 10X5"  Bridge 10X each  SLR 10X each Prone:  POE 1 minute  Press ups 10X  Hip extensions 10X  06/13/22 Reviewed goals, educated importance of HEP compliance for maximal benefits, pt able to recall and demonstrate appropriately  Prone:  POE x 1 min  Prone press up 5x 10"  Hip extension 10x Supine: bridge 10x  Deep breathing x 1 min  Diaphragmatic breathing x 1 min  Abdominal sets x 3 minutes Seated: piriformis stretch in seated position  06/12/22 Goal and HEp review Seated:  sit to stand 10X no UE   Eval MMT, ROM 5x sit to stand Education HEP   PATIENT EDUCATION:  Education details: Findings, symptom awareness, activity modification, POC, and PT role Person educated: Patient Education method: Explanation Education comprehension: verbalized understanding   HOME EXERCISE PROGRAM: Access Code: [email or web address] URL: [email or web address] Date: 05/20/2022 Prepared by: Candie Mile 06/13/22:   Bridge and piriformis in figure 4 position Exercises - Prone on Elbows Stretch  - 3 x daily - 7 x weekly - 10 reps - 5 second hold - Supine Transversus Abdominis Bracing - Hands on Stomach  - 3 x daily - 7 x weekly - 10 reps - Supine Figure 4 Piriformis Stretch  - 3 x daily - 7 x weekly - 3 sets - 15 second hold - Supine Piriformis Stretch with Foot on Ground  - 3 x daily - 7 x weekly - 3 sets - 15 second hold  07/03/22:  Isometric add/abd, hamstring and gastroc stretches.  ASSESSMENT:  CLINICAL IMPRESSION: Session focus with core and proximal strengthening with extension based  exercises.  Min cueing and demonstration to improve mechanics with squats for maximal gluteal activation and reduce stress anterior knee.  Added palloff for core strengthening with min cueing for stability.  In prone position pt reports pain with palpation over sacrum and Lt PSIS.  Added SI stability exercises to HEP, may benefit from further examination on SI alignment in future apts.  Pt able to complete these exercises with no c/o pain, visible fatigue with bridge  due to weakness.  Pt c/o posterior leg tightness, added hamstring and gastroc stretches to HEP as well.    OBJECTIVE IMPAIRMENTS decreased activity tolerance, decreased mobility, decreased ROM, decreased strength, hypomobility, impaired flexibility, improper body mechanics, postural dysfunction, obesity, and pain.   ACTIVITY LIMITATIONS carrying, lifting, bending, sitting, standing, toileting, reach over head, and locomotion level  PARTICIPATION LIMITATIONS: cleaning, shopping, community activity, and occupation  PERSONAL FACTORS Past/current experiences and multiple lumbar surgeries  are also affecting patient's functional outcome.   REHAB POTENTIAL: Good  CLINICAL DECISION MAKING: Stable/uncomplicated  EVALUATION COMPLEXITY: Low   GOALS: Goals reviewed with patient? Yes  SHORT TERM GOALS: Target date: 06/10/22  Patient will be independent with initial HEP and self-management strategies to improve functional outcomes Baseline: Initiated Goal status: MET    LONG TERM GOALS: Target date: 07/01/22  Patient will be independent with advanced HEP and self-management strategies to improve functional outcomes Baseline:  Goal status: IN PROGRESS  2.  Patient will improve modified oswetry score by 13 points or greater to indicate improvement in functional outcomes Baseline: 19 / 50 = 38.0 %; 11/7 14/50=28% Goal status: IN PROGRESS  3.  Patient will report reduction of back pain to <3/10 for improved quality of life and ability  to perform ADLs  Baseline: 7/10  Goal status: IN PROGRESS  4. Patient will have equal to or > 4+/5 MMT throughout BIL LEs to improve ability to perform functional mobility, stair ambulation and ADLs.  Baseline: See above Goal status: IN PROGRESS Partly met (all but glutes)    5. Patient will improve 5x sit to stand <12 seconds to indicate significant improvement in functional strength.    Baseline: 20.1 sec; 11/7 20.5 sec    Goal status: IN PROGRESS  PLAN: PT FREQUENCY: 2x/week  PT DURATION: 4 weeks  PLANNED INTERVENTIONS: Therapeutic exercises, Therapeutic activity, Neuromuscular re-education, Balance training, Gait training, Patient/Family education, Self Care, Joint mobilization, Joint manipulation, Stair training, DME instructions, Dry Needling, Electrical stimulation, Spinal manipulation, Spinal mobilization, Cryotherapy, Moist heat, scar mobilization, Taping, Traction, Ultrasound, Ionotophoresis 81m/ml Dexamethasone, Manual therapy, and Re-evaluation.  PLAN FOR NEXT SESSION: Continue remaining visits approved (5 more) with focus on increasing core and LE strength and reduction of symptoms.  Assess SI alignment with MET as needed.  F/u on HEP compliance.  CIhor Austin LPTA/CLT; CDelana [phone] 07/03/2022, 4:54 PM

## 2022-07-04 ENCOUNTER — Other Ambulatory Visit: Payer: Self-pay | Admitting: Internal Medicine

## 2022-07-04 DIAGNOSIS — M5126 Other intervertebral disc displacement, lumbar region: Secondary | ICD-10-CM

## 2022-07-07 ENCOUNTER — Encounter: Payer: Medicaid Other | Admitting: Internal Medicine

## 2022-07-14 ENCOUNTER — Encounter (HOSPITAL_COMMUNITY): Payer: Medicaid Other | Admitting: Physical Therapy

## 2022-07-14 ENCOUNTER — Encounter: Payer: Self-pay | Admitting: Internal Medicine

## 2022-07-14 DIAGNOSIS — H66001 Acute suppurative otitis media without spontaneous rupture of ear drum, right ear: Secondary | ICD-10-CM

## 2022-07-14 MED ORDER — OFLOXACIN 0.3 % OT SOLN: 5.0000 [drp] | Freq: Every day | OTIC | 0 refills | Status: DC

## 2022-07-15 ENCOUNTER — Ambulatory Visit: Payer: Medicaid Other

## 2022-07-15 ENCOUNTER — Ambulatory Visit: Payer: Medicaid Other | Admitting: Adult Health

## 2022-07-22 ENCOUNTER — Encounter (HOSPITAL_COMMUNITY): Payer: Medicaid Other | Admitting: Physical Therapy

## 2022-07-28 ENCOUNTER — Ambulatory Visit (INDEPENDENT_AMBULATORY_CARE_PROVIDER_SITE_OTHER): Payer: Medicaid Other

## 2022-07-28 DIAGNOSIS — Z3202 Encounter for pregnancy test, result negative: Secondary | ICD-10-CM | POA: Diagnosis not present

## 2022-07-28 DIAGNOSIS — Z3042 Encounter for surveillance of injectable contraceptive: Secondary | ICD-10-CM

## 2022-07-28 LAB — POCT URINE PREGNANCY: Preg Test, Ur: NEGATIVE

## 2022-07-28 MED ORDER — MEDROXYPROGESTERONE ACETATE 150 MG/ML IM SUSP
150.0000 mg | Freq: Once | INTRAMUSCULAR | Status: AC
  Administered 2022-07-28: 150 mg via INTRAMUSCULAR

## 2022-07-28 NOTE — Progress Notes (Signed)
   NURSE VISIT- INJECTION  SUBJECTIVE:  Kaylee Miller is a 31 y.o. [id number] female here for a Depo Provera for contraception/period management. She is a GYN patient.   OBJECTIVE:  There were no vitals taken for this visit.  Appears well, in no apparent distress  Injection administered in: Right upper quad. gluteus  Meds ordered this encounter  Medications   medroxyPROGESTERone (DEPO-PROVERA) injection 150 mg    ASSESSMENT: GYN patient Depo Provera for contraception/period management PLAN: Follow-up: in 11-13 weeks for next Depo   Nidia Grogan  07/28/2022 10:30 AM

## 2022-07-29 ENCOUNTER — Ambulatory Visit (INDEPENDENT_AMBULATORY_CARE_PROVIDER_SITE_OTHER): Payer: Medicaid Other | Admitting: Internal Medicine

## 2022-07-29 ENCOUNTER — Encounter: Payer: Self-pay | Admitting: Internal Medicine

## 2022-07-29 VITALS — BP 122/80 | HR 96 | Ht 67.0 in | Wt 205.0 lb

## 2022-07-29 DIAGNOSIS — Z720 Tobacco use: Secondary | ICD-10-CM | POA: Diagnosis not present

## 2022-07-29 DIAGNOSIS — F419 Anxiety disorder, unspecified: Secondary | ICD-10-CM | POA: Diagnosis not present

## 2022-07-29 DIAGNOSIS — M5126 Other intervertebral disc displacement, lumbar region: Secondary | ICD-10-CM | POA: Diagnosis not present

## 2022-07-29 DIAGNOSIS — E782 Mixed hyperlipidemia: Secondary | ICD-10-CM | POA: Diagnosis not present

## 2022-07-29 DIAGNOSIS — Z1159 Encounter for screening for other viral diseases: Secondary | ICD-10-CM | POA: Diagnosis not present

## 2022-07-29 DIAGNOSIS — F32A Depression, unspecified: Secondary | ICD-10-CM | POA: Diagnosis not present

## 2022-07-29 DIAGNOSIS — R739 Hyperglycemia, unspecified: Secondary | ICD-10-CM

## 2022-07-29 DIAGNOSIS — J45909 Unspecified asthma, uncomplicated: Secondary | ICD-10-CM | POA: Diagnosis not present

## 2022-07-29 DIAGNOSIS — E559 Vitamin D deficiency, unspecified: Secondary | ICD-10-CM

## 2022-07-29 MED ORDER — GABAPENTIN 300 MG PO CAPS: 300.0000 mg | ORAL_CAPSULE | Freq: Every day | ORAL | 3 refills | Status: DC

## 2022-07-29 MED ORDER — NAPROXEN 500 MG PO TABS: 500.0000 mg | ORAL_TABLET | Freq: Two times a day (BID) | ORAL | 3 refills | Status: DC

## 2022-07-29 MED ORDER — TIZANIDINE HCL 4 MG PO TABS: 4.0000 mg | ORAL_TABLET | Freq: Three times a day (TID) | ORAL | 2 refills | Status: DC | PRN

## 2022-07-29 NOTE — Assessment & Plan Note (Signed)
Smokes 0.5 pack/day  Asked about quitting: confirms that she currently smokes cigarettes Advise to quit smoking: Educated about QUITTING to reduce the risk of cancer, cardio and cerebrovascular disease. Assess willingness: Unwilling to quit at this time, but is working on cutting back. Assist with counseling and pharmacotherapy: Counseled for 5 minutes and literature provided. Arrange for follow up: Follow up and continue to offer help.

## 2022-07-29 NOTE — Assessment & Plan Note (Signed)
Well-controlled On Lexapro to 10 mg daily

## 2022-07-29 NOTE — Progress Notes (Signed)
Established Patient Office Visit  Subjective:  Patient ID: Kaylee Miller, female    DOB: [birth year]  Age: 31 y.o. MRN: [id number]  CC:  Chief Complaint  Patient presents with   Depression   Asthma    HPI Kaylee Miller is a 31 y.o. female with past medical history of MDD, asthma and lumbar spinal stenosis who presents for f/u of her chronic medical conditions.  Low back pain: She has chronic low back pain, worse with bending.  She has been on light duty since her last surgery.  She has been trying to avoid heavy lifting.  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  Denies any numbness or tingling of the LE currently.  She is taking gabapentin and as needed naproxen.  She also takes tizanidine as needed for muscle spasms.  MDD: She is doing better with Lexapro 10 mg now.  She takes BuSpar for anxiety.  Denies any anhedonia, SI or HI currently.  Past Medical History:  Diagnosis Date   Anemia    Anxiety    Asthma    [employer] 07/02/2020   Contraceptive management 02/08/2015   COVID    mild case   CSF leak 01/29/2022   Depression    GERD (gastroesophageal reflux disease)    Headache(784.0)    migraines   History of bronchitis    HSV-2 (herpes simplex virus 2) infection    Pneumonia    Pregnant    S/P C-section 07/28/2013   Sciatic nerve pain    Seasonal allergies    Vaginal Pap smear, abnormal     Past Surgical History:  Procedure Laterality Date   adenoids     BACK SURGERY     CESAREAN SECTION  08/25/2005   CESAREAN SECTION N/A 07/28/2013   Procedure: CESAREAN SECTION;  Surgeon: Jonnie Kind, MD;  Location: [facility];  Service: Obstetrics;  Laterality: N/A;   LUMBAR LAMINECTOMY/DECOMPRESSION MICRODISCECTOMY Right 05/25/2021   Procedure: DISCECTOMY Lumbar five - Sacral one;  Surgeon: Ashok Pall, MD;  Location: [facility];  Service: Neurosurgery;  Laterality: Right;   LUMBAR LAMINECTOMY/DECOMPRESSION  MICRODISCECTOMY Right 01/15/2022   Procedure: Right Lumbar Five-Sacral One Microdiscectomy;  Surgeon: Ashok Pall, MD;  Location: [place];  Service: Neurosurgery;  Laterality: Right;  3C/RM 19   LUMBAR LAMINECTOMY/DECOMPRESSION MICRODISCECTOMY N/A 01/30/2022   Procedure: REVISION L5-S1 LAMINECTOMY REPAIR CSS LEAK;  Surgeon: Dawley, Theodoro Doing, DO;  Location: [place];  Service: Neurosurgery;  Laterality: N/A;   LUMBAR LAMINECTOMY/DECOMPRESSION MICRODISCECTOMY N/A 02/19/2022   Procedure: Redo Lumbar five-Sacral one Microdiscectomy;  Surgeon: Ashok Pall, MD;  Location: [place];  Service: Neurosurgery;  Laterality: N/A;   tubes in ears      Family History  Problem Relation Age of Onset   Cancer Mother    Breast cancer Mother    Hypertension Father    Stroke Father    CAD Maternal Grandfather    CAD Paternal Grandmother    CAD Paternal Grandfather    Diabetes Other        great-great grandmother   CAD Other     Social History   Socioeconomic History   Marital status: Single    Spouse name: Not on file   Number of children: Not on file   Years of education: Not on file   Highest education level: Not on file  Occupational History   Not on file  Tobacco Use   Smoking  status: Every Day    Packs/day: 0.50    Years: 9.00    Total pack years: 4.50    Types: Cigarettes    Passive exposure: Never   Smokeless tobacco: Never  Vaping Use   Vaping Use: Never used  Substance and Sexual Activity   Alcohol use: Yes    Comment: Occa   Drug use: Never   Sexual activity: Not Currently    Birth control/protection: Injection  Other Topics Concern   Not on file  Social History Narrative   Not on file   Social Determinants of Health   Financial Resource Strain: Medium Risk (09/18/2020)   Overall Financial Resource Strain (CARDIA)    Difficulty of Paying Living Expenses: Somewhat hard  Food Insecurity: Food Insecurity Present (09/18/2020)   Hunger Vital Sign    Worried About Running Out of Food  in the Last Year: Sometimes true    Ran Out of Food in the Last Year: Never true  Transportation Needs: No Transportation Needs (09/18/2020)   PRAPARE - Hydrologist (Medical): No    Lack of Transportation (Non-Medical): No  Physical Activity: Sufficiently Active (09/18/2020)   Exercise Vital Sign    Days of Exercise per Week: 5 days    Minutes of Exercise per Session: 100 min  Stress: No Stress Concern Present (09/18/2020)   [facility]    Feeling of Stress : Only a little  Social Connections: Moderately Integrated (09/18/2020)   Social Connection and Isolation Panel [NHANES]    Frequency of Communication with Friends and Family: More than three times a week    Frequency of Social Gatherings with Friends and Family: Twice a week    Attends Religious Services: More than 4 times per year    Active Member of [employer] or Organizations: Yes    Attends Archivist Meetings: 1 to 4 times per year    Marital Status: Never married  Intimate Partner Violence: Unknown (09/18/2020)   Humiliation, Afraid, Rape, and Kick questionnaire    Fear of Current or Ex-Partner: No    Emotionally Abused: Patient refused    Physically Abused: No    Sexually Abused: No    Outpatient Medications Prior to Visit  Medication Sig Dispense Refill   albuterol (VENTOLIN HFA) 108 (90 Base) MCG/ACT inhaler Inhale 2 puffs into the lungs every 6 (six) hours as needed for wheezing or shortness of breath. 6.7 g 0   aspirin-acetaminophen-caffeine (EXCEDRIN MIGRAINE) 250-250-65 MG tablet Take 2 tablets by mouth every 6 (six) hours as needed for migraine.     busPIRone (BUSPAR) 5 MG tablet Take 1 tablet (5 mg total) by mouth 2 (two) times daily. 60 tablet 4   docusate sodium (COLACE) 100 MG capsule Take 200 mg by mouth daily.     escitalopram (LEXAPRO) 10 MG tablet Take 1 tablet (10 mg total) by mouth daily. 90 tablet 1    fluticasone (FLONASE) 50 MCG/ACT nasal spray Place 2 sprays into both nostrils daily as needed for allergies or rhinitis.     medroxyPROGESTERone Acetate 150 MG/ML SUSY Inject 1 mL (150 mg total) into the muscle every 3 (three) months. INJECT 1 ML INTO THE MUSCLE EVERY 3 MONTHS Strength: 150 mg/mL 1 mL 4   Melatonin 5 MG CAPS Take 5 mg by mouth at bedtime as needed (sleep).     ondansetron (ZOFRAN-ODT) 4 MG disintegrating tablet DISSOLVE 1 TABLET IN MOUTH EVERY 8 HOURS  AS NEEDED FOR NAUSEA OR VOMITING 20 tablet 0   gabapentin (NEURONTIN) 300 MG capsule Take 1 capsule (300 mg total) by mouth at bedtime. 30 capsule 0   naproxen (NAPROSYN) 500 MG tablet Take 1 tablet (500 mg total) by mouth 2 (two) times daily with a meal. 60 tablet 1   tiZANidine (ZANAFLEX) 4 MG tablet TAKE 1 TABLET BY MOUTH EVERY 8 HOURS AS NEEDED FOR MUSCLE SPASM 30 tablet 0   HYDROcodone-acetaminophen (NORCO) 7.5-325 MG tablet Take 1 tablet by mouth every 6 (six) hours as needed. (Patient not taking: Reported on 07/29/2022)     HYDROcodone-acetaminophen (NORCO) 7.5-325 MG tablet Take 1 tablet by mouth every 6 (six) hours as needed for moderate pain. (Patient not taking: Reported on 07/29/2022) 10 tablet 0   methylPREDNISolone (MEDROL DOSEPAK) 4 MG TBPK tablet Take by mouth daily. Day 1:  2 pills at breakfast, 1 pill at lunch, 1 pill after supper, 2 pills at bedtime;Day 2:  1 pill at breakfast, 1 pill at lunch, 1 pill after supper, 2 pills at bedtime;Day 3:  1 pill at breakfast, 1 pill at lunch, 1 pill after supper, 1 pill at bedtime;Day 4:  1 pill at breakfast, 1 pill at lunch, 1 pill at bedtime;Day 5:  1 pill at breakfast, 1 pill at bedtime;Day 6:  1 pill at breakfast (Patient not taking: Reported on 07/29/2022) 21 tablet 0   ofloxacin (FLOXIN OTIC) 0.3 % OTIC solution Place 5 drops into the right ear daily. (Patient not taking: Reported on 07/29/2022) 5 mL 0   No facility-administered medications prior to visit.    Allergies  Allergen  Reactions   Latex Swelling    ROS Review of Systems  Constitutional:  Negative for chills and fever.  HENT:  Negative for congestion, sinus pressure, sinus pain and sore throat.   Eyes:  Negative for pain and discharge.  Respiratory:  Negative for cough and shortness of breath.   Cardiovascular:  Negative for chest pain and palpitations.  Gastrointestinal:  Positive for constipation. Negative for abdominal pain, diarrhea, nausea and vomiting.  Endocrine: Negative for polydipsia and polyuria.  Genitourinary:  Negative for dysuria and hematuria.  Musculoskeletal:  Positive for back pain and neck pain. Negative for neck stiffness.  Skin:  Negative for rash.  Neurological:  Negative for dizziness, speech difficulty, weakness and headaches.  Psychiatric/Behavioral:  Negative for agitation and behavioral problems.       Objective:    Physical Exam Vitals reviewed.  Constitutional:      General: She is not in acute distress.    Appearance: She is not diaphoretic.  HENT:     Head: Normocephalic and atraumatic.     Nose: Nose normal. No congestion.     Mouth/Throat:     Mouth: Mucous membranes are moist.     Pharynx: No posterior oropharyngeal erythema.  Eyes:     General: No scleral icterus.    Extraocular Movements: Extraocular movements intact.  Cardiovascular:     Rate and Rhythm: Normal rate and regular rhythm.     Pulses: Normal pulses.     Heart sounds: Normal heart sounds. No murmur heard. Pulmonary:     Breath sounds: Normal breath sounds. No wheezing or rales.  Musculoskeletal:     Cervical back: Neck supple. No tenderness.     Right lower leg: No edema.     Left lower leg: No edema.     Comments: ROM of the lumbar spine limited due to pain, point  tenderness in the left lumbar paraspinal area SLR positive on left side  Skin:    General: Skin is warm.     Findings: No rash.     Comments: Incision site C/D/I  Neurological:     General: No focal deficit present.      Mental Status: She is alert and oriented to person, place, and time.  Psychiatric:        Mood and Affect: Mood normal.        Behavior: Behavior normal.     BP 122/80 (BP Location: Right Arm, Patient Position: Sitting, Cuff Size: Normal)   Pulse 96   Ht _0  (1.702 m)   Wt 205 lb (93 kg)   SpO2 97%   BMI 32.11 kg/m  Wt Readings from Last 3 Encounters:  07/29/22 205 lb (93 kg)  06/24/22 208 lb (94.3 kg)  06/09/22 208 lb 12.8 oz (94.7 kg)    Lab Results  Component Value Date   TSH 1.300 07/03/2020   Lab Results  Component Value Date   WBC 8.6 02/19/2022   HGB 12.7 02/19/2022   HCT 38.6 02/19/2022   MCV 86.9 02/19/2022   PLT 219 02/19/2022   Lab Results  Component Value Date   NA 138 02/17/2022   K 4.0 02/17/2022   CO2 26 02/17/2022   GLUCOSE 96 02/17/2022   BUN 17 02/17/2022   CREATININE 1.11 (H) 02/19/2022   BILITOT 0.6 01/29/2022   ALKPHOS 55 01/29/2022   AST 13 (L) 01/29/2022   ALT 12 01/29/2022   PROT 7.7 01/29/2022   ALBUMIN 3.9 01/29/2022   CALCIUM 9.1 02/17/2022   ANIONGAP 7 02/17/2022   Lab Results  Component Value Date   CHOL 223 (H) 07/03/2020   Lab Results  Component Value Date   HDL 51 07/03/2020   Lab Results  Component Value Date   LDLCALC 158 (H) 07/03/2020   Lab Results  Component Value Date   TRIG 79 07/03/2020   Lab Results  Component Value Date   CHOLHDL 4.4 07/03/2020   No results found for: "HGBA1C"    Assessment & Plan:   Problem List Items Addressed This Visit       Respiratory   Mild asthma without complication    Uses albuterol inhaler as needed Well-controlled      Relevant Orders   CBC with Differential/Platelet     Musculoskeletal and Integument   HNP (herniated nucleus pulposus), lumbar    S/p lumbar laminecomy with microdiscectomy in 10/22 S/p L5-S1 re-do microdiscectomy in 06/23 F/u with Spine surgeon Refilled tizanidine and gabapentin Naproxen as needed for back pain Was recently given  prednisone 40 mg X 5 days, avoid recurrent steroid use Avoid heavy lifting and frequent bending      Relevant Medications   tiZANidine (ZANAFLEX) 4 MG tablet   gabapentin (NEURONTIN) 300 MG capsule   naproxen (NAPROSYN) 500 MG tablet     Other   Tobacco abuse    Smokes 0.5 pack/day  Asked about quitting: confirms that she currently smokes cigarettes Advise to quit smoking: Educated about QUITTING to reduce the risk of cancer, cardio and cerebrovascular disease. Assess willingness: Unwilling to quit at this time, but is working on cutting back. Assist with counseling and pharmacotherapy: Counseled for 5 minutes and literature provided. Arrange for follow up: Follow up and continue to offer help.      Anxiety and depression - Primary    Well-controlled On Lexapro to 10 mg daily  Relevant Orders   TSH   CMP14+EGFR   Other Visit Diagnoses     Vitamin D deficiency       Relevant Orders   VITAMIN D 25 Hydroxy (Vit-D Deficiency, Fractures)   Hyperglycemia       Relevant Orders   Hemoglobin A1c   Need for hepatitis C screening test       Relevant Orders   Hepatitis C Antibody   Mixed hyperlipidemia       Relevant Orders   Lipid panel       Meds ordered this encounter  Medications   tiZANidine (ZANAFLEX) 4 MG tablet    Sig: Take 1 tablet (4 mg total) by mouth every 8 (eight) hours as needed for muscle spasms.    Dispense:  30 tablet    Refill:  2   gabapentin (NEURONTIN) 300 MG capsule    Sig: Take 1 capsule (300 mg total) by mouth at bedtime.    Dispense:  30 capsule    Refill:  3   naproxen (NAPROSYN) 500 MG tablet    Sig: Take 1 tablet (500 mg total) by mouth 2 (two) times daily with a meal.    Dispense:  60 tablet    Refill:  3    Follow-up: Return in about 4 months (around 11/28/2022) for MDD and back pain.    Lindell Spar, MD

## 2022-07-29 NOTE — Assessment & Plan Note (Addendum)
Uses albuterol inhaler as needed Well-controlled

## 2022-07-29 NOTE — Patient Instructions (Signed)
Please take medications as prescribed.  Please continue to follow low carb diet and perform moderate exercise/walking at least 150 mins/week.

## 2022-07-29 NOTE — Assessment & Plan Note (Signed)
S/p lumbar laminecomy with microdiscectomy in 10/22 S/p L5-S1 re-do microdiscectomy in 06/23 F/u with Spine surgeon Refilled tizanidine and gabapentin Naproxen as needed for back pain Was recently given prednisone 40 mg X 5 days, avoid recurrent steroid use Avoid heavy lifting and frequent bending

## 2022-08-05 ENCOUNTER — Ambulatory Visit (HOSPITAL_COMMUNITY): Payer: Medicaid Other | Attending: Neurosurgery | Admitting: Physical Therapy

## 2022-08-05 ENCOUNTER — Encounter (HOSPITAL_COMMUNITY): Payer: Self-pay | Admitting: Physical Therapy

## 2022-08-05 DIAGNOSIS — M545 Low back pain, unspecified: Secondary | ICD-10-CM | POA: Insufficient documentation

## 2022-08-05 DIAGNOSIS — F32A Depression, unspecified: Secondary | ICD-10-CM | POA: Diagnosis not present

## 2022-08-05 DIAGNOSIS — M6281 Muscle weakness (generalized): Secondary | ICD-10-CM | POA: Diagnosis present

## 2022-08-05 DIAGNOSIS — E782 Mixed hyperlipidemia: Secondary | ICD-10-CM | POA: Diagnosis not present

## 2022-08-05 DIAGNOSIS — R739 Hyperglycemia, unspecified: Secondary | ICD-10-CM | POA: Diagnosis not present

## 2022-08-05 DIAGNOSIS — F419 Anxiety disorder, unspecified: Secondary | ICD-10-CM | POA: Diagnosis not present

## 2022-08-05 DIAGNOSIS — E559 Vitamin D deficiency, unspecified: Secondary | ICD-10-CM | POA: Diagnosis not present

## 2022-08-05 DIAGNOSIS — Z1159 Encounter for screening for other viral diseases: Secondary | ICD-10-CM | POA: Diagnosis not present

## 2022-08-05 DIAGNOSIS — J45909 Unspecified asthma, uncomplicated: Secondary | ICD-10-CM | POA: Diagnosis not present

## 2022-08-05 NOTE — Therapy (Signed)
OUTPATIENT PHYSICAL THERAPY THORACOLUMBAR TREATMENT  Patient Name: Kaylee Miller MRN: [id number] DOB:March 05, 1991, 31 y.o., female Today's Date: 08/05/2022  PHYSICAL THERAPY DISCHARGE SUMMARY  Visits from Start of Care: 8  Current functional level related to goals / functional outcomes: See below    Remaining deficits: See below   Education / Equipment: See below   Patient agrees to discharge. Patient goals were met. Patient is being discharged due to meeting the stated rehab goals.    PT End of Session - 08/05/22 0823     Visit Number 8    Number of Visits 11    Date for PT Re-Evaluation 08/05/22    Authorization Type Medicaid [employer]    Authorization Time Period Auth 11 visits 9/26-12/24 with 27 visits combined therapies per year.    Authorization - Visit Number 8    Authorization - Number of Visits 11    Progress Note Due on Visit 10    PT Start Time [phone]   arrive late/delayed check in   PT Stop Time 0855    PT Time Calculation (min) 32 min    Activity Tolerance Patient tolerated treatment well;No increased pain    Behavior During Therapy [facility] for tasks assessed/performed             Past Medical History:  Diagnosis Date   Anemia    Anxiety    Asthma    [employer]reesia 07/02/2020   Contraceptive management 02/08/2015   COVID    mild case   CSF leak 01/29/2022   Depression    GERD (gastroesophageal reflux disease)    Headache(784.0)    migraines   History of bronchitis    HSV-2 (herpes simplex virus 2) infection    Pneumonia    Pregnant    S/P C-section 07/28/2013   Sciatic nerve pain    Seasonal allergies    Vaginal Pap smear, abnormal    Past Surgical History:  Procedure Laterality Date   adenoids     BACK SURGERY     CESAREAN SECTION  08/25/2005   CESAREAN SECTION N/A 07/28/2013   Procedure: CESAREAN SECTION;  Surgeon: Jonnie Kind, MD;  Location: [facility];  Service: Obstetrics;  Laterality: N/A;   LUMBAR LAMINECTOMY/DECOMPRESSION  MICRODISCECTOMY Right 05/25/2021   Procedure: DISCECTOMY Lumbar five - Sacral one;  Surgeon: Ashok Pall, MD;  Location: [place];  Service: Neurosurgery;  Laterality: Right;   LUMBAR LAMINECTOMY/DECOMPRESSION MICRODISCECTOMY Right 01/15/2022   Procedure: Right Lumbar Five-Sacral One Microdiscectomy;  Surgeon: Ashok Pall, MD;  Location: [place];  Service: Neurosurgery;  Laterality: Right;  3C/RM 19   LUMBAR LAMINECTOMY/DECOMPRESSION MICRODISCECTOMY N/A 01/30/2022   Procedure: REVISION L5-S1 LAMINECTOMY REPAIR CSS LEAK;  Surgeon: Dawley, Theodoro Doing, DO;  Location: [place];  Service: Neurosurgery;  Laterality: N/A;   LUMBAR LAMINECTOMY/DECOMPRESSION MICRODISCECTOMY N/A 02/19/2022   Procedure: Redo Lumbar five-Sacral one Microdiscectomy;  Surgeon: Ashok Pall, MD;  Location: [place];  Service: Neurosurgery;  Laterality: N/A;   tubes in ears     Patient Active Problem List   Diagnosis Date Noted   Depo-Provera contraceptive status 04/23/2022   Hordeolum externum of right upper eyelid 04/01/2022   Hospital discharge follow-up 03/06/2022   HNP (herniated nucleus pulposus), lumbar 01/15/2022   Anxiety and depression 10/29/2021   Leg swelling 09/13/2021   Body mass index (BMI) 30.0-30.9, adult 08/27/2021   Herniated nucleus pulposus, L5-S1, right 05/24/2021   Right hip pain 04/05/2021   Spinal stenosis of lumbar region with neurogenic claudication 12/19/2020  Disc displacement, lumbar 11/12/2020   ASCUS of cervix with negative high risk HPV 09/21/2020   Mild asthma without complication 02/40/9735   Tobacco abuse 07/03/2020   Migraines 01/05/2013    PCP: Ihor Dow, MD  REFERRING PROVIDER: Ashok Pall, MD   REFERRING DIAG: PT eval/tx for M51.26 disc displacement, lumbar   Rationale for Evaluation and Treatment Rehabilitation  THERAPY DIAG:  Low back pain, unspecified back pain laterality, unspecified chronicity, unspecified whether sciatica present  Muscle weakness (generalized)  ONSET  DATE: Most recent surgery 02/19/22 Initial onset of back pain with RLE radiculopathy in Feb of 2022  SUBJECTIVE:                                                                                                                                                                                           SUBJECTIVE STATEMENT: Patient returns after a month away from PT. Patient got sick so she missed a while. HEP is going well. Patient states 50% improvement since beginning PT. Patient states she still has her days especially with the cold. Symptoms all in butt and back especially at night. Piriformis stretch is helpful. Weakness with a lot of stairs. Feels ready to transition to HEP.  Evaluation:Patient reports back pain with radiculopathy into RLE since Feb of last year. Since then has has several complications and little resolution of symptoms. 4 surgeries total reported. Most recently L5-S1 discetomy on June 28th of 2023. Saw Dr. Christella Noa last Monday for follow-up. States RLE feels weak and pain is improving but still present. Reports she was working Thursday the 14, pulling down heavy pallets/bundles down from a shelf and developed pain down into her LLE similar to past experience and symptoms of the RLE. Estimates load to be about 45#. The rest of the day she was having to bend over and tear paper from a spool. States Dr. Christella Noa plans to get an MRI.   PERTINENT HISTORY:  surgery: right redo L5/S1 discetomy on 02/19/2022   Multiple lumbar surgeries, including repair of CSF leak. PAIN:  Are you having pain? Yes: NPRS scale: 0/10 Pain location: back bil LE LT>RT not beyond knee Pain description: aches Aggravating factors: standing and sitting Relieving factors: stretching   PRECAUTIONS: Back - No more than 15# lifting  WEIGHT BEARING RESTRICTIONS No  FALLS:  Has patient fallen in last 6 months? No  LIVING ENVIRONMENT: Lives with: lives with their family children (oldest 19) Lives in:  House/apartment Stairs: Yes: External: 3 steps; none Has following equipment at home: Single point cane, Walker - 2 wheeled, shower chair, and bed side commode  OCCUPATION: full time employee; fork lift  driver for [employer]  PLOF: Independent  PATIENT GOALS relieve pain   OBJECTIVE:   DIAGNOSTIC FINDINGS:  IMPRESSION: Right laminectomy L5-S1. 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   Interval improvement in CSF leak arising at the laminectomy defect L5-S1 on the right extending into the subcutaneous tissues  PATIENT SURVEYS:  Modified Oswestry 11/7:  14/50=28%;  at evaluation: 19 / 50 = 38.0 %  08/05/22: 8/50 = 16%  SCREENING FOR RED FLAGS: Bowel or bladder incontinence: No Spinal tumors: No Cauda equina syndrome: No Compression fracture: No Abdominal aneurysm: No  COGNITION:  Overall cognitive status: Within functional limits for tasks assessed     SENSATION: WFL   LUMBAR ROM:   Active  A/PROM  eval A/PROM 07/01/22 AROM 08/05/22  Flexion To distal patellae Proximal shin 0% limited  Extension wnl WNL 0% limited  Right lateral flexion jointline Distal patella 0% limited  Left lateral flexion jointline Distal patella with pain 0% limited  Right rotation Limited 15% WNL   Left rotation WNL WNL   No alteration of pain at end ranges  (Blank rows = not tested)  LOWER EXTREMITY ROM:     [facility]   LOWER EXTREMITY MMT:    MMT Right eval Left eval Right 07/01/22 Left 07/01/22 Right  08/05/22 Left 08/05/22  Hip flexion 4 4 4+ _0 Hip extension 4- 4+ 4 4+ 5 4+  Hip abduction _1 Hip adduction _2 Hip internal rotation        Hip external rotation 4+ 4+ 5 5    Knee flexion _3 Knee extension 4+ 4 5 4+ 5 5  Ankle dorsiflexion _4 Ankle plantarflexion 6/10 10/10 10/10     Ankle inversion        Ankle eversion 5 5       (Blank rows = not  tested)    FUNCTIONAL TESTS:  5 times sit to stand: 11/7:  20.5 sec;  evaluation: 20.1 sec  GAIT: WNL   TODAY'S TREATMENT  08/05/22 Reassessment 5X sit to stand: 11.19 seconds MMT and ROM (see above) Modified Oswestry:    07/03/22: 3D hip excursion 10x Pallof 15x with GTB x 2 Prone: POE x 1 min Palpation to sacum/Lt PSIS with reports of some relief with pressure Supine: Bridge 2 sets 5 reps 5" holds Alternating Abd/add with ball and belt 10x 5" Hamstring stretch 3x 30" Standing gastroc stretch 3x 30"   07/01/22 Progress note completed Oswestry: 14/50=28% (was 38%) 5X sit to stand: 20.5 sec (was 20.1 sec) pain behaviors noted today MMT and ROM (see above) Review of goals and POC moving forward  06/26/22 Seated:  sit to stands no UE from standard chair 10X  Piriformis stretch 60" each LE X 2   Prone: POE x 1 min  Prone press up 5x 10"  Hip extension 10x Supine: bridge 10x  SKTC bil 3X20"  LTR 5X10" each side  06/18/22 Seated: Sit to stands no UE standard chair 10X  Piriformis stretch 3X20" each Standing:  Heel raise 20X  Hip abduction 2X10 each  Hip Extension 2X10 each  Lunges onto 4" step 2X10 each no UE  Step ups lateral and forward 2X10 each with 1 UE assist Body mechanics:  Lifting, pushing, pulling, housework demonstration and education  06/16/22 Seated:  Sit to stands no UE standard chair 10X   Piriformis stretch 3X20" each Standing:  heelraise 20X  Hip abduction 10X each  Hip extension 10X each Supine:  abdominal sets with breathing 10X5"  Bridge 10X each  SLR 10X each Prone:  POE 1 minute  Press ups 10X  Hip extensions 10X  06/13/22 Reviewed goals, educated importance of HEP compliance for maximal benefits, pt able to recall and demonstrate appropriately  Prone:  POE x 1 min  Prone press up 5x 10"  Hip extension 10x Supine: bridge 10x  Deep breathing x 1 min  Diaphragmatic breathing x 1 min  Abdominal sets x 3 minutes Seated:  piriformis stretch in seated position  06/12/22 Goal and HEp review Seated:  sit to stand 10X no UE   Eval MMT, ROM 5x sit to stand Education HEP   PATIENT EDUCATION:  Education details: 12/12 HEP, reassessment findings, joining gym; EVAL: Findings, symptom awareness, activity modification, POC, and PT role Person educated: Patient Education method: Explanation Education comprehension: verbalized understanding   HOME EXERCISE PROGRAM: Access Code: [email or web address] URL: [email or web address] Date: 05/20/2022 Prepared by: Candie Mile 06/13/22:   Bridge and piriformis in figure 4 position Exercises - Prone on Elbows Stretch  - 3 x daily - 7 x weekly - 10 reps - 5 second hold - Supine Transversus Abdominis Bracing - Hands on Stomach  - 3 x daily - 7 x weekly - 10 reps - Supine Figure 4 Piriformis Stretch  - 3 x daily - 7 x weekly - 3 sets - 15 second hold - Supine Piriformis Stretch with Foot on Ground  - 3 x daily - 7 x weekly - 3 sets - 15 second hold  07/03/22:  Isometric add/abd, hamstring and gastroc stretches.  ASSESSMENT:  CLINICAL IMPRESSION: Patient has met all short term goals and long term goals with ability to complete HEP and improvement in symptoms, strength, ROM, activity tolerance, and functional mobility. Patient continues to be limited by intermittent symptoms and endurance with ADL/mobility. Patient educated on joining gym and how to further progress HEP. Patient discharged from PT at this time.  OBJECTIVE IMPAIRMENTS decreased activity tolerance, decreased mobility, decreased ROM, decreased strength, hypomobility, impaired flexibility, improper body mechanics, postural dysfunction, obesity, and pain.   ACTIVITY LIMITATIONS carrying, lifting, bending, sitting, standing, toileting, reach over head, and locomotion level  PARTICIPATION LIMITATIONS: cleaning, shopping, community activity, and occupation  PERSONAL FACTORS Past/current experiences and  multiple lumbar surgeries  are also affecting patient's functional outcome.   REHAB POTENTIAL: Good  CLINICAL DECISION MAKING: Stable/uncomplicated  EVALUATION COMPLEXITY: Low   GOALS: Goals reviewed with patient? Yes  SHORT TERM GOALS: Target date: 06/10/22  Patient will be independent with initial HEP and self-management strategies to improve functional outcomes Baseline: Initiated Goal status: MET    LONG TERM GOALS: Target date: 07/01/22  Patient will be independent with advanced HEP and self-management strategies to improve functional outcomes Baseline:  Goal status: MET  2.  Patient will improve modified oswetry score by 13 points or greater to indicate improvement in functional outcomes Baseline: 19 / 50 = 38.0 %; 11/7 14/50=28% 08/05/22: 8/50 = 16% Goal status: MET  3.  Patient will report reduction of back pain to <3/10 for improved quality of life and ability to perform ADLs  Baseline: 7/10 08/05/22: 3-4/10  Goal status: MET  4. Patient will have equal to or > 4+/5 MMT throughout BIL LEs to improve ability to perform functional mobility, stair ambulation  and ADLs.  Baseline: See above Goal status: MET     5. Patient will improve 5x sit to stand <12 seconds to indicate significant improvement in functional strength.    Baseline: 20.1 sec; 11/7 20.5 sec  12/12:11.19 seconds without UE use    Goal status: MET  PLAN: PT FREQUENCY: 2x/week  PT DURATION: 4 weeks  PLANNED INTERVENTIONS: Therapeutic exercises, Therapeutic activity, Neuromuscular re-education, Balance training, Gait training, Patient/Family education, Self Care, Joint mobilization, Joint manipulation, Stair training, DME instructions, Dry Needling, Electrical stimulation, Spinal manipulation, Spinal mobilization, Cryotherapy, Moist heat, scar mobilization, Taping, Traction, Ultrasound, Ionotophoresis 70m/ml Dexamethasone, Manual therapy, and Re-evaluation.  PLAN FOR NEXT SESSION: n/a  8:59 AM,  08/05/22 AMearl LatinPT, DPT [facility] Therap[facility]

## 2022-08-06 LAB — CBC WITH DIFFERENTIAL/PLATELET
Basophils Absolute: 0 10*3/uL (ref 0.0–0.2)
Basos: 1 %
EOS (ABSOLUTE): 0.8 10*3/uL — ABNORMAL HIGH (ref 0.0–0.4)
Eos: 13 %
Hematocrit: 40.4 % (ref 34.0–46.6)
Hemoglobin: 13.4 g/dL (ref 11.1–15.9)
Immature Grans (Abs): 0 10*3/uL (ref 0.0–0.1)
Immature Granulocytes: 1 %
Lymphocytes Absolute: 2.4 10*3/uL (ref 0.7–3.1)
Lymphs: 38 %
MCH: 27.3 pg (ref 26.6–33.0)
MCHC: 33.2 g/dL (ref 31.5–35.7)
MCV: 82 fL (ref 79–97)
Monocytes Absolute: 0.5 10*3/uL (ref 0.1–0.9)
Monocytes: 7 %
Neutrophils Absolute: 2.6 10*3/uL (ref 1.4–7.0)
Neutrophils: 40 %
Platelets: 254 10*3/uL (ref 150–450)
RBC: 4.91 x10E6/uL (ref 3.77–5.28)
RDW: 12.8 % (ref 11.7–15.4)
WBC: 6.4 10*3/uL (ref 3.4–10.8)

## 2022-08-06 LAB — CMP14+EGFR
ALT: 12 IU/L (ref 0–32)
AST: 12 IU/L (ref 0–40)
Albumin/Globulin Ratio: 1.6 (ref 1.2–2.2)
Albumin: 4 g/dL (ref 3.9–4.9)
Alkaline Phosphatase: 61 IU/L (ref 44–121)
BUN/Creatinine Ratio: 12 (ref 9–23)
BUN: 9 mg/dL (ref 6–20)
Bilirubin Total: 0.4 mg/dL (ref 0.0–1.2)
CO2: 19 mmol/L — ABNORMAL LOW (ref 20–29)
Calcium: 9 mg/dL (ref 8.7–10.2)
Chloride: 106 mmol/L (ref 96–106)
Creatinine, Ser: 0.78 mg/dL (ref 0.57–1.00)
Globulin, Total: 2.5 g/dL (ref 1.5–4.5)
Glucose: 92 mg/dL (ref 70–99)
Potassium: 4.1 mmol/L (ref 3.5–5.2)
Sodium: 139 mmol/L (ref 134–144)
Total Protein: 6.5 g/dL (ref 6.0–8.5)
eGFR: 103 mL/min/{1.73_m2} (ref >59)

## 2022-08-06 LAB — HEMOGLOBIN A1C
Est. average glucose Bld gHb Est-mCnc: 128 mg/dL
Hgb A1c MFr Bld: 6.1 % — ABNORMAL HIGH (ref 4.8–5.6)

## 2022-08-06 LAB — LIPID PANEL
Chol/HDL Ratio: 5.3 ratio — ABNORMAL HIGH (ref 0.0–4.4)
Cholesterol, Total: 187 mg/dL (ref 100–199)
HDL: 35 mg/dL — ABNORMAL LOW (ref >39)
LDL Chol Calc (NIH): 135 mg/dL — ABNORMAL HIGH (ref 0–99)
Triglycerides: 94 mg/dL (ref 0–149)
VLDL Cholesterol Cal: 17 mg/dL (ref 5–40)

## 2022-08-06 LAB — TSH: TSH: 1.48 u[IU]/mL (ref 0.450–4.500)

## 2022-08-06 LAB — VITAMIN D 25 HYDROXY (VIT D DEFICIENCY, FRACTURES): Vit D, 25-Hydroxy: 16.8 ng/mL — ABNORMAL LOW (ref 30.0–100.0)

## 2022-08-06 LAB — HEPATITIS C ANTIBODY: Hep C Virus Ab: NONREACTIVE

## 2022-10-20 ENCOUNTER — Ambulatory Visit (INDEPENDENT_AMBULATORY_CARE_PROVIDER_SITE_OTHER): Payer: Federal, State, Local not specified - PPO | Admitting: *Deleted

## 2022-10-20 DIAGNOSIS — Z3042 Encounter for surveillance of injectable contraceptive: Secondary | ICD-10-CM

## 2022-10-20 MED ORDER — MEDROXYPROGESTERONE ACETATE 150 MG/ML IM SUSP
150.0000 mg | Freq: Once | INTRAMUSCULAR | Status: AC
  Administered 2022-10-20: 150 mg via INTRAMUSCULAR

## 2022-10-20 NOTE — Progress Notes (Signed)
   NURSE VISIT- INJECTION  SUBJECTIVE:  Kaylee Miller is a 32 y.o. [id number] female here for a Depo Provera for contraception/period management. She is a GYN patient.   OBJECTIVE:  There were no vitals taken for this visit.  Appears well, in no apparent distress  Injection administered in: Left upper quad. gluteus  Meds ordered this encounter  Medications   medroxyPROGESTERone (DEPO-PROVERA) injection 150 mg    ASSESSMENT: GYN patient Depo Provera for contraception/period management PLAN: Follow-up: in 11-13 weeks for next Depo   [name]  10/20/2022 10:25 AM

## 2022-10-23 ENCOUNTER — Encounter: Payer: Self-pay | Admitting: Radiology

## 2022-11-14 IMAGING — DX DG LUMBAR SPINE COMPLETE 4+V
5 series · 5 of 5 positions shown · non-contrast
Comparison: None.

CLINICAL DATA: Chronic low back pain radiating to the buttocks next
field

EXAM:
LUMBAR SPINE - COMPLETE 4+ VIEW

[l-spine ap]
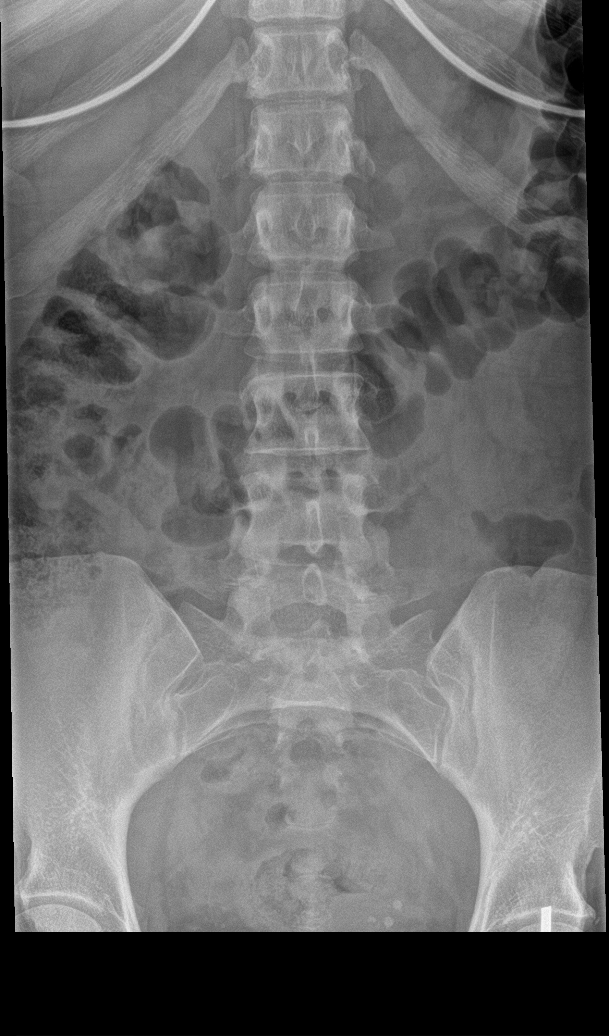

[l-spine obl (1 of 2)]
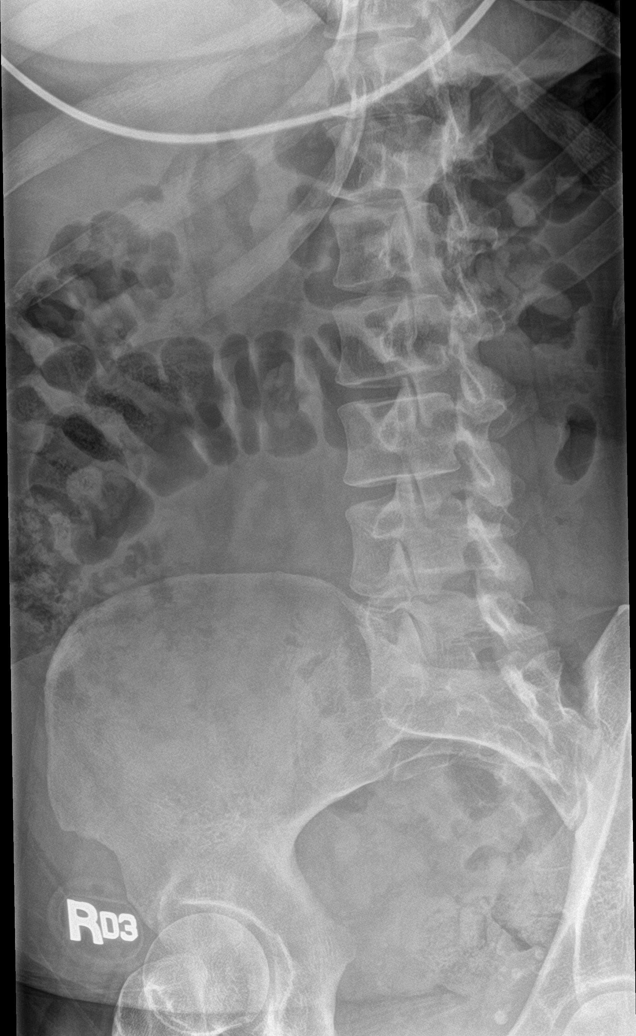

[l-spine obl (2 of 2)]
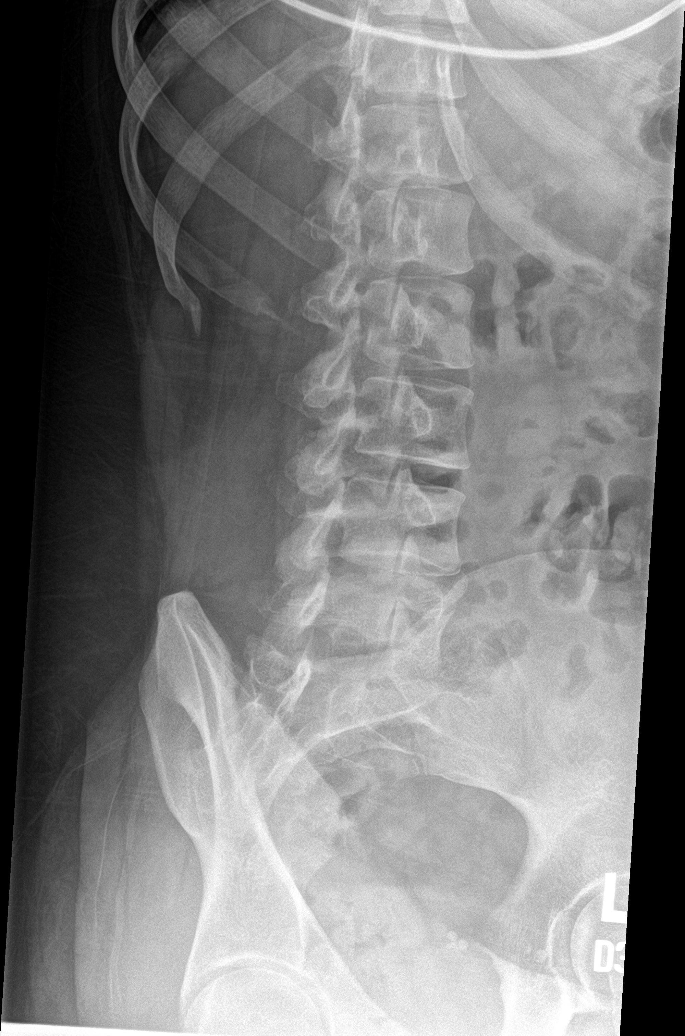

[l-spine lat]
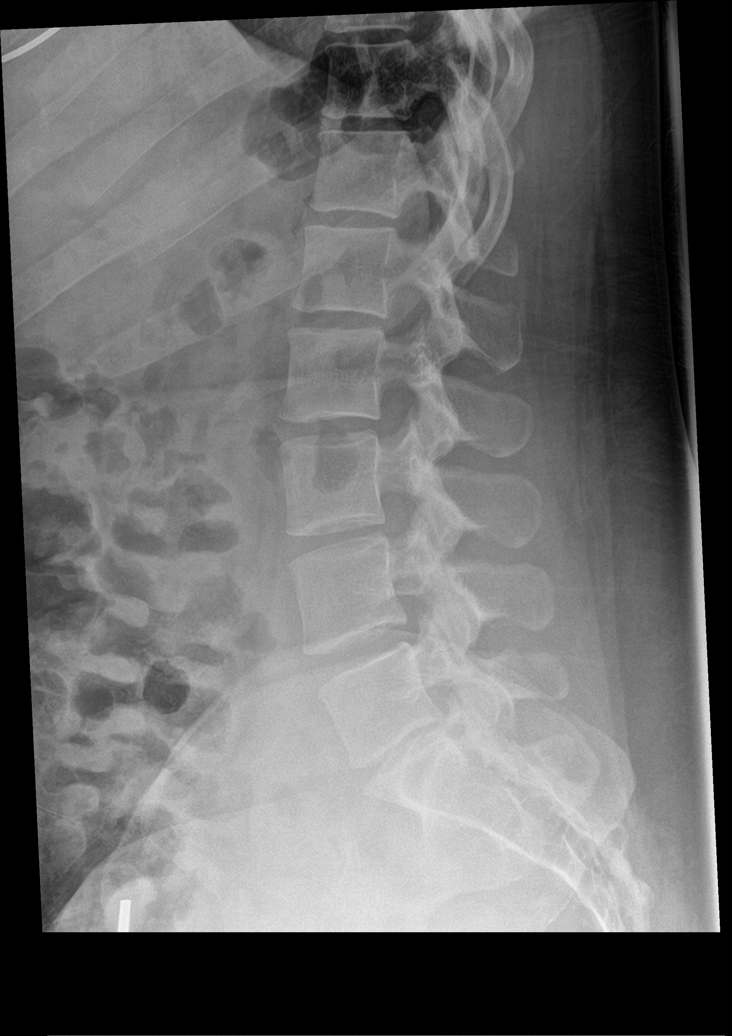

[l-spine spot]
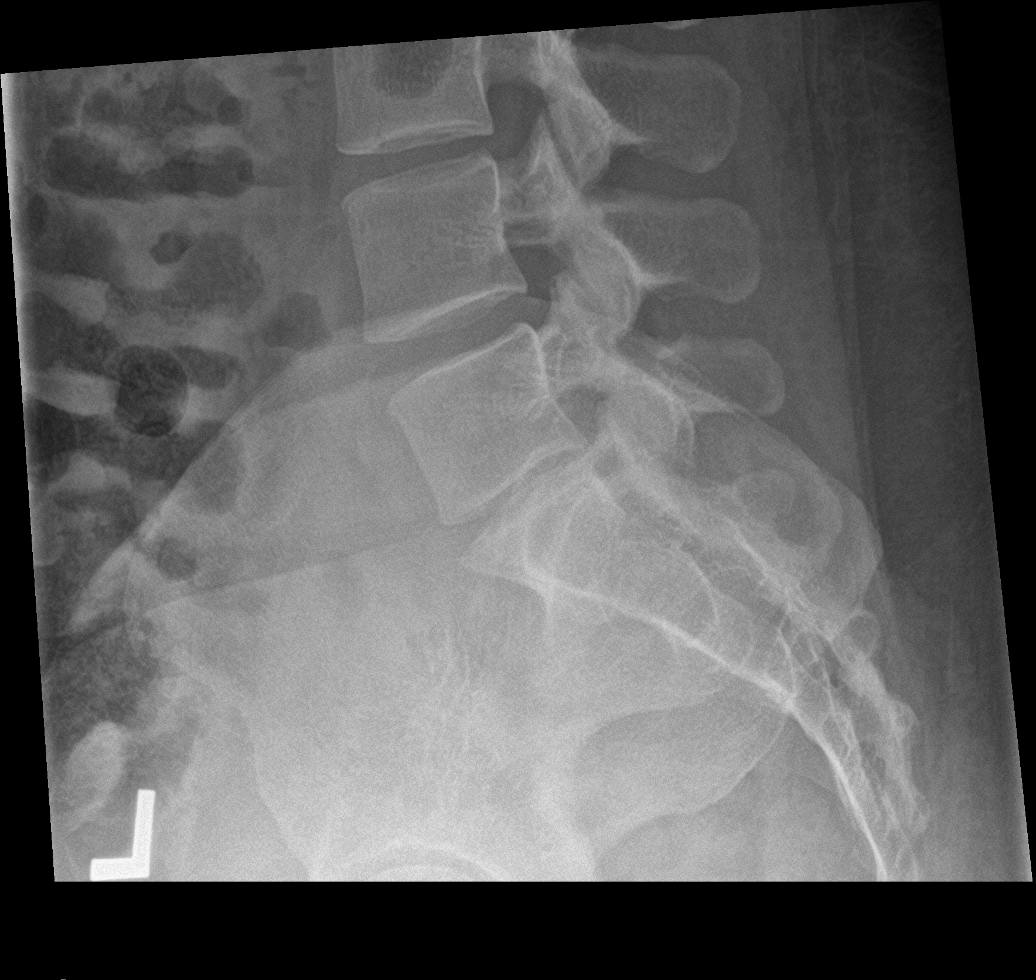

[5 of 5 positions shown; findings below may reference images not displayed]

FINDINGS: Rudimentary ribs at the lowest rib-bearing level denoted as T12 for
numbering convention. Five non-rib-bearing lumbar type vertebral
bodies. No acute vertebral body fracture or height loss is seen.
Intervertebral disc height loss is present in the lumbar spine, most
pronounced at the L5-S1 level. No vertebral body fracture or height
loss. No spondylolysis or spondylolisthesis. Bones of the pelvis are
intact and congruent. Normal bowel gas pattern. Few phleboliths in
the pelvis. Abdominopelvic soft tissues as included are otherwise
unremarkable.
IMPRESSION: 1. No acute vertebral body fracture or height loss.
2. Mild degenerative disc disease at the L5-S1.

## 2022-11-27 ENCOUNTER — Other Ambulatory Visit: Payer: Self-pay | Admitting: Internal Medicine

## 2022-11-27 DIAGNOSIS — M5126 Other intervertebral disc displacement, lumbar region: Secondary | ICD-10-CM

## 2022-12-01 ENCOUNTER — Ambulatory Visit: Payer: Federal, State, Local not specified - PPO | Admitting: Internal Medicine

## 2022-12-01 ENCOUNTER — Encounter: Payer: Self-pay | Admitting: Internal Medicine

## 2022-12-01 VITALS — BP 116/75 | HR 87 | Ht 67.0 in | Wt 215.0 lb

## 2022-12-01 DIAGNOSIS — M5126 Other intervertebral disc displacement, lumbar region: Secondary | ICD-10-CM

## 2022-12-01 DIAGNOSIS — R7303 Prediabetes: Secondary | ICD-10-CM | POA: Diagnosis not present

## 2022-12-01 DIAGNOSIS — Z720 Tobacco use: Secondary | ICD-10-CM | POA: Diagnosis not present

## 2022-12-01 DIAGNOSIS — J45909 Unspecified asthma, uncomplicated: Secondary | ICD-10-CM

## 2022-12-01 DIAGNOSIS — E669 Obesity, unspecified: Secondary | ICD-10-CM

## 2022-12-01 DIAGNOSIS — F419 Anxiety disorder, unspecified: Secondary | ICD-10-CM

## 2022-12-01 DIAGNOSIS — F32A Depression, unspecified: Secondary | ICD-10-CM

## 2022-12-01 MED ORDER — NAPROXEN 500 MG PO TABS: 500.0000 mg | ORAL_TABLET | Freq: Two times a day (BID) | ORAL | 3 refills | Status: DC

## 2022-12-01 MED ORDER — METHYLPREDNISOLONE ACETATE 80 MG/ML IJ SUSP
40.0000 mg | Freq: Once | INTRAMUSCULAR | Status: AC
  Administered 2022-12-01: 40 mg via INTRAMUSCULAR

## 2022-12-01 MED ORDER — TIZANIDINE HCL 4 MG PO TABS: 4.0000 mg | ORAL_TABLET | Freq: Every evening | ORAL | 3 refills | Status: DC | PRN

## 2022-12-01 MED ORDER — GABAPENTIN 300 MG PO CAPS: 300.0000 mg | ORAL_CAPSULE | Freq: Every day | ORAL | 3 refills | Status: DC

## 2022-12-01 MED ORDER — KETOROLAC TROMETHAMINE 60 MG/2ML IM SOLN
60.0000 mg | Freq: Once | INTRAMUSCULAR | Status: AC
  Administered 2022-12-01: 60 mg via INTRAMUSCULAR

## 2022-12-01 NOTE — Assessment & Plan Note (Signed)
Uses albuterol inhaler as needed Well-controlled 

## 2022-12-01 NOTE — Assessment & Plan Note (Signed)
Well-controlled On Lexapro to 10 mg daily Takes BuSpar 5 mg twice daily

## 2022-12-01 NOTE — Assessment & Plan Note (Signed)
Smokes 0.5 pack/day  Asked about quitting: confirms that she currently smokes cigarettes Advise to quit smoking: Educated about QUITTING to reduce the risk of cancer, cardio and cerebrovascular disease. Assess willingness: Unwilling to quit at this time, but is working on cutting back. Assist with counseling and pharmacotherapy: Counseled for 5 minutes and literature provided. Arrange for follow up: Follow up and continue to offer help. 

## 2022-12-01 NOTE — Patient Instructions (Addendum)
Please take Naproxen and Gabapentin for back pain. Please take Tizanidine as needed for back muscle spasms.  Please perform back exercises regularly.  Please continue to cut down -> quit smoking.

## 2022-12-01 NOTE — Progress Notes (Signed)
Established Patient Office Visit  Subjective:  Patient ID: Kaylee Miller, female    DOB: 13-May-1991  Age: 32 y.o. MRN: [id number]  CC:  Chief Complaint  Patient presents with   Anxiety    Four month follow up. Patient states her whole back side is in pain, ongoing pain for months    HPI Kaylee Miller is a 32 y.o. female with past medical history of MDD, asthma and lumbar spinal stenosis who presents for f/u of her chronic medical conditions.  Low back pain: She has chronic low back pain, worse with bending.  Her pain has been worse for the last 1 week.  Denies any recent injury or fall.  She has been on light duty since her last surgery.  She has been trying to avoid heavy lifting.  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  Denies any numbness or tingling of the LE currently.  She is taking gabapentin and as needed naproxen.  She has run out of gabapentin.  She also takes tizanidine as needed for muscle spasms.  MDD: She is doing better with Lexapro 10 mg now.  She takes BuSpar for anxiety.  Denies any anhedonia, SI or HI currently.  Past Medical History:  Diagnosis Date   Anemia    Anxiety    Asthma    Phreesia 07/02/2020   Contraceptive management 02/08/2015   COVID    mild case   CSF leak 01/29/2022   Depression    GERD (gastroesophageal reflux disease)    Headache(784.0)    migraines   History of bronchitis    HSV-2 (herpes simplex virus 2) infection    Pneumonia    Pregnant    S/P C-section 07/28/2013   Sciatic nerve pain    Seasonal allergies    Vaginal Pap smear, abnormal     Past Surgical History:  Procedure Laterality Date   adenoids     BACK SURGERY     CESAREAN SECTION  08/25/2005   CESAREAN SECTION N/A 07/28/2013   Procedure: CESAREAN SECTION;  Surgeon: [name], MD;  Location: [facility];  Service: Obstetrics;  Laterality: N/A;   LUMBAR LAMINECTOMY/DECOMPRESSION MICRODISCECTOMY Right  05/25/2021   Procedure: DISCECTOMY Lumbar five - Sacral one;  Surgeon: Coletta Memos, MD;  Location: [facility] OR;  Service: Neurosurgery;  Laterality: Right;   LUMBAR LAMINECTOMY/DECOMPRESSION MICRODISCECTOMY Right 01/15/2022   Procedure: Right Lumbar Five-Sacral One Microdiscectomy;  Surgeon: Coletta Memos, MD;  Location: [facility] OR;  Service: Neurosurgery;  Laterality: Right;  3C/RM 19   LUMBAR LAMINECTOMY/DECOMPRESSION MICRODISCECTOMY N/A 01/30/2022   Procedure: REVISION L5-S1 LAMINECTOMY REPAIR CSS LEAK;  Surgeon: Dawley, Alan Mulder, DO;  Location: [facility] OR;  Service: Neurosurgery;  Laterality: N/A;   LUMBAR LAMINECTOMY/DECOMPRESSION MICRODISCECTOMY N/A 02/19/2022   Procedure: Redo Lumbar five-Sacral one Microdiscectomy;  Surgeon: Coletta Memos, MD;  Location: [facility] OR;  Service: Neurosurgery;  Laterality: N/A;   tubes in ears      Family History  Problem Relation Age of Onset   Cancer Mother    Breast cancer Mother    Hypertension Father    Stroke Father    CAD Maternal Grandfather    CAD Paternal Grandmother    CAD Paternal Grandfather    Diabetes Other        great-great grandmother   CAD Other     Social History   Socioeconomic History   Marital status: Single    Spouse  name: Not on file   Number of children: Not on file   Years of education: Not on file   Highest education level: 12th grade  Occupational History   Not on file  Tobacco Use   Smoking status: Every Day    Packs/day: 0.50    Years: 9.00    Additional pack years: 0.00    Total pack years: 4.50    Types: Cigarettes    Passive exposure: Never   Smokeless tobacco: Never  Vaping Use   Vaping Use: Never used  Substance and Sexual Activity   Alcohol use: Yes    Comment: Occa   Drug use: Never   Sexual activity: Not Currently    Birth control/protection: Injection  Other Topics Concern   Not on file  Social History Narrative   Not on file   Social Determinants of Health   Financial Resource Strain: Medium Risk  (11/27/2022)   Overall Financial Resource Strain (CARDIA)    Difficulty of Paying Living Expenses: Somewhat hard  Food Insecurity: Food Insecurity Present (11/27/2022)   Hunger Vital Sign    Worried About Running Out of Food in the Last Year: Sometimes true    Ran Out of Food in the Last Year: Never true  Transportation Needs: No Transportation Needs (11/27/2022)   PRAPARE - Administrator, Civil Service (Medical): No    Lack of Transportation (Non-Medical): No  Physical Activity: Insufficiently Active (11/27/2022)   Exercise Vital Sign    Days of Exercise per Week: 2 days    Minutes of Exercise per Session: 10 min  Stress: No Stress Concern Present (11/27/2022)   [employer] of Occupational Health - Occupational Stress Questionnaire    Feeling of Stress : Only a little  Social Connections: Moderately Isolated (11/27/2022)   Social Connection and Isolation Panel [NHANES]    Frequency of Communication with Friends and Family: More than three times a week    Frequency of Social Gatherings with Friends and Family: Once a week    Attends Religious Services: 1 to 4 times per year    Active Member of [employer] or Organizations: No    Attends Engineer, structural: Not on file    Marital Status: Never married  Intimate Partner Violence: Unknown (09/18/2020)   Humiliation, Afraid, Rape, and Kick questionnaire    Fear of Current or Ex-Partner: No    Emotionally Abused: Patient declined    Physically Abused: No    Sexually Abused: No    Outpatient Medications Prior to Visit  Medication Sig Dispense Refill   albuterol (VENTOLIN HFA) 108 (90 Base) MCG/ACT inhaler Inhale 2 puffs into the lungs every 6 (six) hours as needed for wheezing or shortness of breath. 6.7 g 0   aspirin-acetaminophen-caffeine (EXCEDRIN MIGRAINE) 250-250-65 MG tablet Take 2 tablets by mouth every 6 (six) hours as needed for migraine.     busPIRone (BUSPAR) 5 MG tablet Take 1 tablet (5 mg total) by mouth 2 (two)  times daily. 60 tablet 4   docusate sodium (COLACE) 100 MG capsule Take 200 mg by mouth daily.     escitalopram (LEXAPRO) 10 MG tablet Take 1 tablet (10 mg total) by mouth daily. 90 tablet 1   fluticasone (FLONASE) 50 MCG/ACT nasal spray Place 2 sprays into both nostrils daily as needed for allergies or rhinitis.     medroxyPROGESTERone Acetate 150 MG/ML SUSY Inject 1 mL (150 mg total) into the muscle every 3 (three) months. INJECT 1 ML INTO  THE MUSCLE EVERY 3 MONTHS Strength: 150 mg/mL 1 mL 4   Melatonin 5 MG CAPS Take 5 mg by mouth at bedtime as needed (sleep).     ondansetron (ZOFRAN-ODT) 4 MG disintegrating tablet DISSOLVE 1 TABLET IN MOUTH EVERY 8 HOURS AS NEEDED FOR NAUSEA OR VOMITING 20 tablet 0   gabapentin (NEURONTIN) 300 MG capsule Take 1 capsule (300 mg total) by mouth at bedtime. 30 capsule 3   naproxen (NAPROSYN) 500 MG tablet Take 1 tablet (500 mg total) by mouth 2 (two) times daily with a meal. 60 tablet 3   tiZANidine (ZANAFLEX) 4 MG tablet TAKE 1 TABLET BY MOUTH EVERY 8 HOURS AS NEEDED FOR MUSCLE SPASM 30 tablet 0   No facility-administered medications prior to visit.    Allergies  Allergen Reactions   Latex Swelling    ROS Review of Systems  Constitutional:  Negative for chills and fever.  HENT:  Negative for congestion, sinus pressure, sinus pain and sore throat.   Eyes:  Negative for pain and discharge.  Respiratory:  Negative for cough and shortness of breath.   Cardiovascular:  Negative for chest pain and palpitations.  Gastrointestinal:  Positive for constipation. Negative for abdominal pain, diarrhea, nausea and vomiting.  Endocrine: Negative for polydipsia and polyuria.  Genitourinary:  Negative for dysuria and hematuria.  Musculoskeletal:  Positive for back pain and neck pain. Negative for neck stiffness.  Skin:  Negative for rash.  Neurological:  Negative for dizziness, speech difficulty, weakness and headaches.  Psychiatric/Behavioral:  Negative for  agitation and behavioral problems.       Objective:    Physical Exam Vitals reviewed.  Constitutional:      General: She is not in acute distress.    Appearance: She is not diaphoretic.  HENT:     Head: Normocephalic and atraumatic.     Nose: Nose normal. No congestion.     Mouth/Throat:     Mouth: Mucous membranes are moist.     Pharynx: No posterior oropharyngeal erythema.  Eyes:     General: No scleral icterus.    Extraocular Movements: Extraocular movements intact.  Cardiovascular:     Rate and Rhythm: Normal rate and regular rhythm.     Pulses: Normal pulses.     Heart sounds: Normal heart sounds. No murmur heard. Pulmonary:     Breath sounds: Normal breath sounds. No wheezing or rales.  Musculoskeletal:     Cervical back: Neck supple. No tenderness.     Right lower leg: No edema.     Left lower leg: No edema.     Comments: ROM of the lumbar spine limited due to pain, point tenderness in the left lumbar paraspinal area SLR positive on left side  Skin:    General: Skin is warm.     Findings: No rash.     Comments: Incision site C/D/I  Neurological:     General: No focal deficit present.     Mental Status: She is alert and oriented to person, place, and time.  Psychiatric:        Mood and Affect: Mood normal.        Behavior: Behavior normal.     BP 116/75 (BP Location: Left Arm, Patient Position: Sitting, Cuff Size: Normal)   Pulse 87   Ht 5\' 7"  (1.702 m)   Wt 215 lb (97.5 kg)   SpO2 94%   BMI 33.67 kg/m  Wt Readings from Last 3 Encounters:  12/01/22 215 lb (97.5 kg)  07/29/22 205 lb (93 kg)  06/24/22 208 lb (94.3 kg)    Lab Results  Component Value Date   TSH 1.480 08/05/2022   Lab Results  Component Value Date   WBC 6.4 08/05/2022   HGB 13.4 08/05/2022   HCT 40.4 08/05/2022   MCV 82 08/05/2022   PLT 254 08/05/2022   Lab Results  Component Value Date   NA 139 08/05/2022   K 4.1 08/05/2022   CO2 19 (L) 08/05/2022   GLUCOSE 92 08/05/2022    BUN 9 08/05/2022   CREATININE 0.78 08/05/2022   BILITOT 0.4 08/05/2022   ALKPHOS 61 08/05/2022   AST 12 08/05/2022   ALT 12 08/05/2022   PROT 6.5 08/05/2022   ALBUMIN 4.0 08/05/2022   CALCIUM 9.0 08/05/2022   ANIONGAP 7 02/17/2022   EGFR 103 08/05/2022   Lab Results  Component Value Date   CHOL 187 08/05/2022   Lab Results  Component Value Date   HDL 35 (L) 08/05/2022   Lab Results  Component Value Date   LDLCALC 135 (H) 08/05/2022   Lab Results  Component Value Date   TRIG 94 08/05/2022   Lab Results  Component Value Date   CHOLHDL 5.3 (H) 08/05/2022   Lab Results  Component Value Date   HGBA1C 6.1 (H) 08/05/2022      Assessment & Plan:   Problem List Items Addressed This Visit       Respiratory   Mild asthma without complication    Uses albuterol inhaler as needed Well-controlled        Musculoskeletal and Integument   HNP (herniated nucleus pulposus), lumbar - Primary    S/p lumbar laminecomy with microdiscectomy in 10/22 S/p L5-S1 re-do microdiscectomy in 06/23 Completed PT, needs to do back exercises at home Advised to f/u with Spine surgeon Toradol and Depo-Medrol IM today Refilled tizanidine and gabapentin Naproxen as needed for back pain Was recently given prednisone 40 mg X 5 days, avoid recurrent steroid use Avoid heavy lifting and frequent bending      Relevant Medications   tiZANidine (ZANAFLEX) 4 MG tablet   naproxen (NAPROSYN) 500 MG tablet   gabapentin (NEURONTIN) 300 MG capsule     Other   Tobacco abuse    Smokes 0.5 pack/day  Asked about quitting: confirms that she currently smokes cigarettes Advise to quit smoking: Educated about QUITTING to reduce the risk of cancer, cardio and cerebrovascular disease. Assess willingness: Unwilling to quit at this time, but is working on cutting back. Assist with counseling and pharmacotherapy: Counseled for 5 minutes and literature provided. Arrange for follow up: Follow up and  continue to offer help.      Anxiety and depression    Well-controlled On Lexapro to 10 mg daily Takes BuSpar 5 mg twice daily      Class 1 obesity with serious comorbidity in adult    Diet modification and moderate exercise advised Weight loss can be beneficial for low back pain as well      Prediabetes    Lab Results  Component Value Date   HGBA1C 6.1 (H) 08/05/2022  Advised to follow low carb diet      Relevant Orders   CMP14+EGFR   Hemoglobin A1c    Meds ordered this encounter  Medications   tiZANidine (ZANAFLEX) 4 MG tablet    Sig: Take 1 tablet (4 mg total) by mouth at bedtime as needed for muscle spasms.    Dispense:  30 tablet    Refill:  3   naproxen (NAPROSYN) 500 MG tablet    Sig: Take 1 tablet (500 mg total) by mouth 2 (two) times daily with a meal.    Dispense:  60 tablet    Refill:  3   gabapentin (NEURONTIN) 300 MG capsule    Sig: Take 1 capsule (300 mg total) by mouth at bedtime.    Dispense:  30 capsule    Refill:  3   ketorolac (TORADOL) injection 60 mg   methylPREDNISolone acetate (DEPO-MEDROL) injection 40 mg    Follow-up: Return in about 4 months (around 04/02/2023).    Anabel Halon, MD

## 2022-12-01 NOTE — Assessment & Plan Note (Addendum)
S/p lumbar laminecomy with microdiscectomy in 10/22 S/p L5-S1 re-do microdiscectomy in 06/23 Completed PT, needs to do back exercises at home Advised to f/u with Spine surgeon Toradol and Depo-Medrol IM today Refilled tizanidine and gabapentin Naproxen as needed for back pain Was recently given prednisone 40 mg X 5 days, avoid recurrent steroid use Avoid heavy lifting and frequent bending

## 2022-12-01 NOTE — Assessment & Plan Note (Signed)
Lab Results  Component Value Date   HGBA1C 6.1 (H) 08/05/2022   Advised to follow low carb diet

## 2022-12-01 NOTE — Assessment & Plan Note (Signed)
Diet modification and moderate exercise advised Weight loss can be beneficial for low back pain as well

## 2022-12-02 ENCOUNTER — Ambulatory Visit: Payer: Medicaid Other | Admitting: Internal Medicine

## 2022-12-10 ENCOUNTER — Telehealth: Payer: Self-pay | Admitting: Internal Medicine

## 2022-12-10 NOTE — Telephone Encounter (Signed)
FMLA forms  Copied Noted Sleeved  Call patient when ready to pick up

## 2022-12-11 DIAGNOSIS — Z0279 Encounter for issue of other medical certificate: Secondary | ICD-10-CM

## 2022-12-16 NOTE — Telephone Encounter (Signed)
Called patient will pick up forms 

## 2023-01-05 ENCOUNTER — Telehealth: Payer: Self-pay | Admitting: Internal Medicine

## 2023-01-05 NOTE — Telephone Encounter (Signed)
FMLA forms   Noted  Copied  Sleeved  Original in pcp box Copy in folder front desk

## 2023-01-06 ENCOUNTER — Emergency Department (HOSPITAL_COMMUNITY)
Admission: EM | Admit: 2023-01-06 | Discharge: 2023-01-06 | Disposition: A | Discharge status: Home / Self Care | Payer: Federal, State, Local not specified - PPO | Attending: Emergency Medicine | Admitting: Emergency Medicine

## 2023-01-06 ENCOUNTER — Other Ambulatory Visit: Payer: Self-pay

## 2023-01-06 ENCOUNTER — Emergency Department (HOSPITAL_COMMUNITY): Payer: Federal, State, Local not specified - PPO

## 2023-01-06 DIAGNOSIS — M545 Low back pain, unspecified: Secondary | ICD-10-CM | POA: Insufficient documentation

## 2023-01-06 DIAGNOSIS — Z0279 Encounter for issue of other medical certificate: Secondary | ICD-10-CM

## 2023-01-06 DIAGNOSIS — M5441 Lumbago with sciatica, right side: Secondary | ICD-10-CM

## 2023-01-06 DIAGNOSIS — Z91040 Latex allergy status: Secondary | ICD-10-CM | POA: Insufficient documentation

## 2023-01-06 DIAGNOSIS — Y92410 Unspecified street and highway as the place of occurrence of the external cause: Secondary | ICD-10-CM | POA: Insufficient documentation

## 2023-01-06 LAB — POC URINE PREG, ED: Preg Test, Ur: NEGATIVE

## 2023-01-06 MED ORDER — ONDANSETRON HCL 4 MG/2ML IJ SOLN
4.0000 mg | Freq: Once | INTRAMUSCULAR | Status: AC
  Administered 2023-01-06: 4 mg via INTRAVENOUS
  Filled 2023-01-06 (×2): qty 2

## 2023-01-06 MED ORDER — FENTANYL CITRATE PF 50 MCG/ML IJ SOSY: 50.0000 ug | PREFILLED_SYRINGE | Freq: Once | INTRAMUSCULAR | Status: DC

## 2023-01-06 MED ORDER — HYDROCODONE-ACETAMINOPHEN 5-325 MG PO TABS: ORAL_TABLET | ORAL | 0 refills | Status: DC

## 2023-01-06 MED ORDER — ONDANSETRON HCL 4 MG/2ML IJ SOLN: 4.0000 mg | Freq: Once | INTRAMUSCULAR | Status: DC

## 2023-01-06 MED ORDER — KETOROLAC TROMETHAMINE 30 MG/ML IJ SOLN
30.0000 mg | Freq: Once | INTRAMUSCULAR | Status: AC
  Administered 2023-01-06: 30 mg via INTRAVENOUS
  Filled 2023-01-06: qty 1

## 2023-01-06 MED ORDER — FENTANYL CITRATE PF 50 MCG/ML IJ SOSY
50.0000 ug | PREFILLED_SYRINGE | Freq: Once | INTRAMUSCULAR | Status: DC
  Filled 2023-01-06: qty 1

## 2023-01-06 NOTE — Discharge Instructions (Signed)
Apply ice packs on and off to your back.  Continue taking your naproxen and tizanidine as directed.  Avoid twisting bending or heavy lifting for at least 1 week.  Please follow-up with your primary care provider for recheck.  As discussed, return to the emergency department for any new or worsening symptoms.
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
## 2023-01-07 ENCOUNTER — Ambulatory Visit: Payer: Federal, State, Local not specified - PPO

## 2023-01-07 ENCOUNTER — Ambulatory Visit (INDEPENDENT_AMBULATORY_CARE_PROVIDER_SITE_OTHER): Payer: Self-pay | Admitting: Internal Medicine

## 2023-01-07 ENCOUNTER — Encounter: Payer: Self-pay | Admitting: Internal Medicine

## 2023-01-07 DIAGNOSIS — M5126 Other intervertebral disc displacement, lumbar region: Secondary | ICD-10-CM

## 2023-01-07 DIAGNOSIS — Z09 Encounter for follow-up examination after completed treatment for conditions other than malignant neoplasm: Secondary | ICD-10-CM | POA: Insufficient documentation

## 2023-01-07 MED ORDER — GABAPENTIN 300 MG PO CAPS: 300.0000 mg | ORAL_CAPSULE | Freq: Two times a day (BID) | ORAL | 3 refills | Status: DC

## 2023-01-07 NOTE — Assessment & Plan Note (Signed)
ER chart reviewed, including imaging Was given Norco as needed for severe pain Increase gabapentin to 300 mg twice daily Continue tizanidine as needed for back muscle spasms
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
## 2023-01-07 NOTE — Patient Instructions (Addendum)
Please continue to take Norco as prescribed for severe pain only.  Please start taking Gabapentin 300 mg twice daily.  Please take Tizanidine at bedtime for now.  Please avoid heavy lifting and frequent bending.

## 2023-01-07 NOTE — Assessment & Plan Note (Signed)
S/p lumbar laminecomy with microdiscectomy in 10/22 S/p L5-S1 re-do microdiscectomy in 06/23 Completed PT, needs to do back exercises at home Advised to f/u with Spine surgeon Was given Norco as needed for severe pain Increase gabapentin to 300 mg twice daily Continue tizanidine as needed for back muscle spasms Naproxen as needed for back pain Was recently given prednisone 40 mg X 5 days, avoid recurrent steroid use Avoid heavy lifting and frequent bending

## 2023-01-07 NOTE — Telephone Encounter (Signed)
Patient pick-up forms.

## 2023-01-09 ENCOUNTER — Telehealth: Payer: Self-pay | Admitting: Internal Medicine

## 2023-01-09 NOTE — Telephone Encounter (Signed)
Pt called in regard to FMLA forms. Can hold off on new set of forms . Her office did received 1st set and they are now under review. Will reach out to patient if anything needs to be done further

## 2023-01-11 ENCOUNTER — Encounter (HOSPITAL_COMMUNITY): Payer: Self-pay

## 2023-01-11 ENCOUNTER — Emergency Department (HOSPITAL_COMMUNITY): Payer: Federal, State, Local not specified - PPO

## 2023-01-11 ENCOUNTER — Emergency Department (HOSPITAL_COMMUNITY)
Admission: EM | Admit: 2023-01-11 | Discharge: 2023-01-11 | Disposition: A | Discharge status: Home / Self Care | Payer: Federal, State, Local not specified - PPO | Attending: Emergency Medicine | Admitting: Emergency Medicine

## 2023-01-11 ENCOUNTER — Other Ambulatory Visit: Payer: Self-pay

## 2023-01-11 DIAGNOSIS — J45909 Unspecified asthma, uncomplicated: Secondary | ICD-10-CM | POA: Diagnosis not present

## 2023-01-11 DIAGNOSIS — M5442 Lumbago with sciatica, left side: Secondary | ICD-10-CM | POA: Diagnosis not present

## 2023-01-11 DIAGNOSIS — M545 Low back pain, unspecified: Secondary | ICD-10-CM | POA: Diagnosis not present

## 2023-01-11 DIAGNOSIS — M5432 Sciatica, left side: Secondary | ICD-10-CM

## 2023-01-11 LAB — PREGNANCY, URINE: Preg Test, Ur: NEGATIVE

## 2023-01-11 LAB — URINALYSIS, ROUTINE W REFLEX MICROSCOPIC
Bilirubin Urine: NEGATIVE
Glucose, UA: NEGATIVE mg/dL
Hgb urine dipstick: NEGATIVE
Ketones, ur: NEGATIVE mg/dL
Leukocytes,Ua: NEGATIVE
Nitrite: NEGATIVE
Protein, ur: NEGATIVE mg/dL
Specific Gravity, Urine: 1.01 (ref 1.005–1.030)
pH: 6 (ref 5.0–8.0)

## 2023-01-11 MED ORDER — ONDANSETRON 8 MG PO TBDP
8.0000 mg | ORAL_TABLET | Freq: Once | ORAL | Status: AC
  Administered 2023-01-11: 8 mg via ORAL
  Filled 2023-01-11: qty 1

## 2023-01-11 MED ORDER — METHYLPREDNISOLONE SODIUM SUCC 125 MG IJ SOLR
125.0000 mg | Freq: Once | INTRAMUSCULAR | Status: AC
  Administered 2023-01-11: 125 mg via INTRAMUSCULAR
  Filled 2023-01-11: qty 2

## 2023-01-11 MED ORDER — IBUPROFEN 600 MG PO TABS: 600.0000 mg | ORAL_TABLET | Freq: Three times a day (TID) | ORAL | 0 refills | Status: DC | PRN

## 2023-01-11 MED ORDER — HYDROCODONE-ACETAMINOPHEN 5-325 MG PO TABS
1.0000 | ORAL_TABLET | Freq: Once | ORAL | Status: AC
  Administered 2023-01-11: 1 via ORAL
  Filled 2023-01-11: qty 1

## 2023-01-11 MED ORDER — METHYLPREDNISOLONE SODIUM SUCC 125 MG IJ SOLR: 125.0000 mg | Freq: Once | INTRAMUSCULAR | Status: DC

## 2023-01-11 MED ORDER — PREDNISONE 10 MG (21) PO TBPK: ORAL_TABLET | Freq: Every day | ORAL | 0 refills | Status: DC

## 2023-01-11 NOTE — Discharge Instructions (Signed)
Your workup today was overall reassuring.  You have radicular pain likely from inflammation of the sciatic nerve.  I have sent steroid Dosepak as well as ibuprofen into the pharmacy for you.  Use of Tylenol and ibuprofen as you need to.  Follow-up with neurosurgeon.  For any concerning symptoms return to the emergency department.
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
## 2023-01-12 ENCOUNTER — Ambulatory Visit: Payer: Federal, State, Local not specified - PPO

## 2023-01-12 NOTE — Telephone Encounter (Signed)
Charge dropped for form fee

## 2023-01-13 ENCOUNTER — Ambulatory Visit (INDEPENDENT_AMBULATORY_CARE_PROVIDER_SITE_OTHER): Payer: Federal, State, Local not specified - PPO | Admitting: *Deleted

## 2023-01-13 ENCOUNTER — Telehealth: Payer: Self-pay

## 2023-01-13 DIAGNOSIS — Z3042 Encounter for surveillance of injectable contraceptive: Secondary | ICD-10-CM

## 2023-01-13 MED ORDER — MEDROXYPROGESTERONE ACETATE 150 MG/ML IM SUSY
150.0000 mg | PREFILLED_SYRINGE | Freq: Once | INTRAMUSCULAR | Status: AC
Start: 2023-01-13 — End: 2023-01-13
  Administered 2023-01-13: 150 mg via INTRAMUSCULAR

## 2023-01-13 NOTE — Progress Notes (Signed)
   NURSE VISIT- INJECTION  SUBJECTIVE:  Kaylee Miller is a 32 y.o. [id number] female here for a Depo Provera for contraception/period management. She is a GYN patient.   OBJECTIVE:  There were no vitals taken for this visit.  Appears well, in no apparent distress  Injection administered in: Right upper quad. gluteus  Meds ordered this encounter  Medications   medroxyPROGESTERone Acetate SUSY 150 mg    ASSESSMENT: GYN patient Depo Provera for contraception/period management PLAN: Follow-up: in 11-13 weeks for next Depo   Malachy Mood  01/13/2023 2:33 PM

## 2023-01-13 NOTE — Transitions of Care (Post Inpatient/ED Visit) (Signed)
01/13/2023  Name: Kaylee Miller MRN: [id number] DOB: 06/27/1991  Today's TOC FU Call Status: Today's TOC FU Call Status:: Successful TOC FU Call Competed TOC FU Call Complete Date: 01/13/23  Transition Care Management Follow-up Telephone Call Date of Discharge: 01/11/23 Dis[facility] Type of Discharge: Emergency Department Reason for ED Visit:  (Sciatica of left side) How have you been since you were released from the hospital?: Same Any questions or concerns?: No  Items Reviewed: Did you receive and understand the discharge instructions provided?: Yes Medications obtained,verified, and reconciled?: Yes (Medications Reviewed) Any new allergies since your discharge?: No Dietary orders reviewed?: NA Do you have support at home?: Yes  Medications Reviewed Today: Medications Reviewed Today     Reviewed by Leigh Aurora, CMA (Certified Medical Assistant) on 01/13/23 at 1650  Med List Status: <None>   Medication Order Taking? Sig Documenting Provider Last Dose Status Informant  albuterol (VENTOLIN HFA) 108 (90 Base) MCG/ACT inhaler [id number] No Inhale 2 puffs into the lungs every 6 (six) hours as needed for wheezing or shortness of breath. Devoria Albe, MD Taking Active Self, Pharmacy Records  busPIRone (BUSPAR) 5 MG tablet [id number] No Take 1 tablet (5 mg total) by mouth 2 (two) times daily. Adline Potter, NP Taking Active   docusate sodium (COLACE) 100 MG capsule [id number] No Take 200 mg by mouth daily. [provider] Taking Active Self, Pharmacy Records  escitalopram (LEXAPRO) 10 MG tablet [id number] No Take 1 tablet (10 mg total) by mouth daily. Anabel Halon, MD Taking Active   fluticasone [facility]) 50 MCG/ACT nasal spray [id number] No Place 2 sprays into both nostrils daily as needed for allergies or rhinitis. [provider] Taking Active Self, Pharmacy Records  gabapentin (NEURONTIN) 300 MG capsule [id number] No Take 1  capsule (300 mg total) by mouth 2 (two) times daily. Anabel Halon, MD Taking Active   HYDROcodone-acetaminophen (NORCO/VICODIN) 5-325 MG tablet [id number] No Take one tab po q 4 hrs prn pain Triplett, Tammy, PA-C Taking Active   ibuprofen (ADVIL) 800 MG tablet [id number] No Take 800 mg by mouth every 8 (eight) hours as needed. [provider] Taking Active   medroxyPROGESTERone Acetate 150 MG/ML SUSY [id number] No Inject 1 mL (150 mg total) into the muscle every 3 (three) months. INJECT 1 ML INTO THE MUSCLE EVERY 3 MONTHS Strength: 150 mg/mL Cyril Mourning A, NP Taking Active   Melatonin 5 MG CAPS [id number] No Take 5 mg by mouth at bedtime as needed (sleep). [provider] Taking Active Self, Pharmacy Records  naproxen (NAPROSYN) 500 MG tablet [id number] No Take 1 tablet (500 mg total) by mouth 2 (two) times daily with a meal. Anabel Halon, MD Taking Active   ondansetron (ZOFRAN-ODT) 4 MG disintegrating tablet [id number] No DISSOLVE 1 TABLET IN MOUTH EVERY 8 HOURS AS NEEDED FOR NAUSEA OR VOMITING Adline Potter, NP Taking Active   predniSONE (DELTASONE) 10 MG tablet [id number] No TAKE 6 TABLETS ON DAY 1-2 THEN TAKE 5 TABLETS ON DAY 3-4 THEN TAKE 4 TABLETS ON DAY 5-6 THEN TAKE 3 TABLETS ON DAY 7-8 THEN TAKE 2 TABLETS ON DAY 9-10 THEN TAKE 1 TABLET ON DAY 11-12. TAKE ALL DOSES WITH FOOD. [provider] Taking Active   predniSONE (STERAPRED UNI-PAK 21 TAB) 10 MG (21) TBPK tablet [id number] No Take by mouth daily. Take 6 tabs by mouth daily  for 2 days, then 5 tabs for 2 days, then 4 tabs for 2 days,  then 3 tabs for 2 days, 2 tabs for 2 days, then 1 tab by mouth daily for 2 days Marita Kansas, PA-C Taking Active   tiZANidine (ZANAFLEX) 4 MG tablet [id number] No Take 1 tablet (4 mg total) by mouth at bedtime as needed for muscle spasms. Anabel Halon, MD Taking Active             Home Care and Equipment/Supplies: Were Home Health Services Ordered?: NA Any new equipment  or medical supplies ordered?: NA  Functional Questionnaire: Do you need assistance with bathing/showering or dressing?: No Do you need assistance with meal preparation?: No Do you need assistance with eating?: No Do you have difficulty maintaining continence: No Do you need assistance with getting out of bed/getting out of a chair/moving?: No Do you have difficulty managing or taking your medications?: No  Follow up appointments reviewed: PCP Follow-up appointment confirmed?: No (pt declined- wants to follow up with Neurology) MD Provider Line Number:[phone] Given: Yes Specialist Hospital Follow-up appointment confirmed?: No Date of Specialist follow-up appointment?:  (pt is waiting to hear back from Dr. Sueanne Margarita office with an appointment date. She will call them tomorrow if she hasn't heard back.) Follow-Up Specialty Provider:: Dr. Stevphen Rochester Reason Specialist Follow-Up Not Confirmed: Patient has Specialist Provider Number and will Call for Appointment Do you need transportation to your follow-up appointment?: No Do you understand care options if your condition(s) worsen?: Yes-patient verbalized understanding    SIGNATURE Agnes Lawrence, CMA (AAMA)  [facility] [phone]

## 2023-01-28 ENCOUNTER — Encounter: Payer: Self-pay | Admitting: Internal Medicine

## 2023-01-28 ENCOUNTER — Ambulatory Visit: Payer: Federal, State, Local not specified - PPO | Admitting: Internal Medicine

## 2023-01-28 VITALS — BP 106/71 | HR 99 | Ht 67.0 in | Wt 217.2 lb

## 2023-01-28 DIAGNOSIS — M5431 Sciatica, right side: Secondary | ICD-10-CM | POA: Diagnosis not present

## 2023-01-28 DIAGNOSIS — M5432 Sciatica, left side: Secondary | ICD-10-CM

## 2023-01-28 DIAGNOSIS — M48062 Spinal stenosis, lumbar region with neurogenic claudication: Secondary | ICD-10-CM | POA: Diagnosis not present

## 2023-01-28 DIAGNOSIS — M5126 Other intervertebral disc displacement, lumbar region: Secondary | ICD-10-CM | POA: Diagnosis not present

## 2023-01-28 DIAGNOSIS — Z09 Encounter for follow-up examination after completed treatment for conditions other than malignant neoplasm: Secondary | ICD-10-CM

## 2023-01-28 MED ORDER — METHOCARBAMOL 750 MG PO TABS
750.0000 mg | ORAL_TABLET | Freq: Two times a day (BID) | ORAL | 2 refills | Status: DC | PRN
Start: 2023-01-28 — End: 2023-02-20

## 2023-01-28 NOTE — Assessment & Plan Note (Signed)
Has bilateral buttock area pain and radiating symptoms into bilateral LE Completed oral steroids recently, would avoid repeat due to oral steroids Continue gabapentin 300 mg twice daily Switched from tizanidine to Robaxin Avoid heavy lifting and frequent bending Offered PT, but she is awaiting neurosurgery evaluation in the next week

## 2023-01-28 NOTE — Assessment & Plan Note (Signed)
Follows with Spine specialist Avoid heavy lifting Previous MRI of lumbar spine showed spinal stenosis, which can explain her recurrent bilateral back pain

## 2023-01-28 NOTE — Assessment & Plan Note (Addendum)
ER chart reviewed, including imaging Was given oral steroids Increase gabapentin to 300 mg twice daily Switched muscle relaxer to Robaxin as needed for back muscle spasms Work note provided

## 2023-01-28 NOTE — Progress Notes (Signed)
Established Patient Office Visit  Subjective:  Patient ID: Kaylee Miller, female    DOB: 11-23-1990  Age: 32 y.o. MRN: [id number]  CC:  Chief Complaint  Patient presents with   back spasm    Patient has been having back spasms    HPI Kaylee Miller is a 32 y.o. female with past medical history of MDD, asthma and lumbar spinal stenosis who presents for complaint of bilateral LE muscle cramping.  She went to ER on 01/11/23 for acute worsening of the left buttock area pain.  She was given oral prednisone, which she has completed.  She has not noticed much difference in her pain.  She reports worsening of low back pain and bilateral lower extremity muscle cramping for the last 2 days.  Denies any recent injury or fall.  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  Low back pain: She has chronic low back pain, worse with bending.  Denies any recent injury or fall.  She has been on light duty since her last surgery.  She has been trying to avoid heavy lifting.  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  Denies any numbness or tingling of the LE currently.  She is taking gabapentin and as needed naproxen. She also takes tizanidine as needed for muscle spasms.   Past Medical History:  Diagnosis Date   Anemia    Anxiety    Asthma    [employer] 07/02/2020   Contraceptive management 02/08/2015   COVID    mild case   CSF leak 01/29/2022   Depression    GERD (gastroesophageal reflux disease)    Headache(784.0)    migraines   History of bronchitis    HSV-2 (herpes simplex virus 2) infection    Pneumonia    Pregnant    S/P C-section 07/28/2013   Sciatic nerve pain    Seasonal allergies    Vaginal Pap smear, abnormal     Past Surgical History:  Procedure Laterality Date   adenoids     BACK SURGERY     CESAREAN SECTION  08/25/2005   CESAREAN SECTION N/A 07/28/2013    Procedure: CESAREAN SECTION;  Surgeon: [name], MD;  Location: [facility];  Service: Obstetrics;  Laterality: N/A;   LUMBAR LAMINECTOMY/DECOMPRESSION MICRODISCECTOMY Right 05/25/2021   Procedure: DISCECTOMY Lumbar five - Sacral one;  Surgeon: Coletta Memos, MD;  Location: [facility] OR;  Service: Neurosurgery;  Laterality: Right;   LUMBAR LAMINECTOMY/DECOMPRESSION MICRODISCECTOMY Right 01/15/2022   Procedure: Right Lumbar Five-Sacral One Microdiscectomy;  Surgeon: Coletta Memos, MD;  Location: [facility] OR;  Service: Neurosurgery;  Laterality: Right;  3C/RM 19   LUMBAR LAMINECTOMY/DECOMPRESSION MICRODISCECTOMY N/A 01/30/2022   Procedure: REVISION L5-S1 LAMINECTOMY REPAIR CSS LEAK;  Surgeon: Dawley, Alan Mulder, DO;  Location: [facility] OR;  Service: Neurosurgery;  Laterality: N/A;   LUMBAR LAMINECTOMY/DECOMPRESSION MICRODISCECTOMY N/A 02/19/2022   Procedure: Redo Lumbar five-Sacral one Microdiscectomy;  Surgeon: Coletta Memos, MD;  Location: [facility] OR;  Service: Neurosurgery;  Laterality: N/A;   tubes in ears      Family History  Problem Relation Age of Onset   Cancer Mother    Breast cancer Mother    Hypertension Father    Stroke Father    CAD Maternal Grandfather    CAD Paternal Grandmother    CAD Paternal Grandfather    Diabetes Other  great-great grandmother   CAD Other     Social History   Socioeconomic History   Marital status: Single    Spouse name: Not on file   Number of children: Not on file   Years of education: Not on file   Highest education level: 12th grade  Occupational History   Not on file  Tobacco Use   Smoking status: Every Day    Packs/day: 0.50    Years: 9.00    Additional pack years: 0.00    Total pack years: 4.50    Types: Cigarettes    Passive exposure: Never   Smokeless tobacco: Never  Vaping Use   Vaping Use: Never used  Substance and Sexual Activity   Alcohol use: Not Currently    Comment: Occa   Drug use: Never   Sexual activity: Not Currently    Birth  control/protection: Injection  Other Topics Concern   Not on file  Social History Narrative   Not on file   Social Determinants of Health   Financial Resource Strain: Medium Risk (11/27/2022)   Overall Financial Resource Strain (CARDIA)    Difficulty of Paying Living Expenses: Somewhat hard  Food Insecurity: Food Insecurity Present (11/27/2022)   Hunger Vital Sign    Worried About Running Out of Food in the Last Year: Sometimes true    Ran Out of Food in the Last Year: Never true  Transportation Needs: No Transportation Needs (11/27/2022)   PRAPARE - Administrator, Civil Service (Medical): No    Lack of Transportation (Non-Medical): No  Physical Activity: Insufficiently Active (11/27/2022)   Exercise Vital Sign    Days of Exercise per Week: 2 days    Minutes of Exercise per Session: 10 min  Stress: No Stress Concern Present (11/27/2022)   [employer] of Occupational Health - Occupational Stress Questionnaire    Feeling of Stress : Only a little  Social Connections: Moderately Isolated (11/27/2022)   Social Connection and Isolation Panel [NHANES]    Frequency of Communication with Friends and Family: More than three times a week    Frequency of Social Gatherings with Friends and Family: Once a week    Attends Religious Services: 1 to 4 times per year    Active Member of [employer] or Organizations: No    Attends Engineer, structural: Not on file    Marital Status: Never married  Intimate Partner Violence: Unknown (09/18/2020)   Humiliation, Afraid, Rape, and Kick questionnaire    Fear of Current or Ex-Partner: No    Emotionally Abused: Patient declined    Physically Abused: No    Sexually Abused: No    Outpatient Medications Prior to Visit  Medication Sig Dispense Refill   albuterol (VENTOLIN HFA) 108 (90 Base) MCG/ACT inhaler Inhale 2 puffs into the lungs every 6 (six) hours as needed for wheezing or shortness of breath. 6.7 g 0   busPIRone (BUSPAR) 5 MG tablet  Take 1 tablet (5 mg total) by mouth 2 (two) times daily. 60 tablet 4   docusate sodium (COLACE) 100 MG capsule Take 200 mg by mouth daily.     escitalopram (LEXAPRO) 10 MG tablet Take 1 tablet (10 mg total) by mouth daily. 90 tablet 1   fluticasone (FLONASE) 50 MCG/ACT nasal spray Place 2 sprays into both nostrils daily as needed for allergies or rhinitis.     gabapentin (NEURONTIN) 300 MG capsule Take 1 capsule (300 mg total) by mouth 2 (two) times daily. 60 capsule  3   ibuprofen (ADVIL) 800 MG tablet Take 800 mg by mouth every 8 (eight) hours as needed.     medroxyPROGESTERone Acetate 150 MG/ML SUSY Inject 1 mL (150 mg total) into the muscle every 3 (three) months. INJECT 1 ML INTO THE MUSCLE EVERY 3 MONTHS Strength: 150 mg/mL 1 mL 4   Melatonin 5 MG CAPS Take 5 mg by mouth at bedtime as needed (sleep).     naproxen (NAPROSYN) 500 MG tablet Take 1 tablet (500 mg total) by mouth 2 (two) times daily with a meal. 60 tablet 3   ondansetron (ZOFRAN-ODT) 4 MG disintegrating tablet DISSOLVE 1 TABLET IN MOUTH EVERY 8 HOURS AS NEEDED FOR NAUSEA OR VOMITING 20 tablet 0   HYDROcodone-acetaminophen (NORCO/VICODIN) 5-325 MG tablet Take one tab po q 4 hrs prn pain 10 tablet 0   predniSONE (DELTASONE) 10 MG tablet TAKE 6 TABLETS ON DAY 1-2 THEN TAKE 5 TABLETS ON DAY 3-4 THEN TAKE 4 TABLETS ON DAY 5-6 THEN TAKE 3 TABLETS ON DAY 7-8 THEN TAKE 2 TABLETS ON DAY 9-10 THEN TAKE 1 TABLET ON DAY 11-12. TAKE ALL DOSES WITH FOOD.     predniSONE (STERAPRED UNI-PAK 21 TAB) 10 MG (21) TBPK tablet Take by mouth daily. Take 6 tabs by mouth daily  for 2 days, then 5 tabs for 2 days, then 4 tabs for 2 days, then 3 tabs for 2 days, 2 tabs for 2 days, then 1 tab by mouth daily for 2 days 42 tablet 0   tiZANidine (ZANAFLEX) 4 MG tablet Take 1 tablet (4 mg total) by mouth at bedtime as needed for muscle spasms. 30 tablet 3   No facility-administered medications prior to visit.    Allergies  Allergen Reactions   Latex Swelling     ROS Review of Systems  Constitutional:  Negative for chills and fever.  HENT:  Negative for congestion, sinus pressure, sinus pain and sore throat.   Eyes:  Negative for pain and discharge.  Respiratory:  Negative for cough and shortness of breath.   Cardiovascular:  Negative for chest pain and palpitations.  Gastrointestinal:  Positive for constipation. Negative for abdominal pain, diarrhea, nausea and vomiting.  Endocrine: Negative for polydipsia and polyuria.  Genitourinary:  Negative for dysuria and hematuria.  Musculoskeletal:  Positive for back pain and neck pain. Negative for neck stiffness.  Skin:  Negative for rash.  Neurological:  Negative for dizziness, speech difficulty, weakness and headaches.  Psychiatric/Behavioral:  Negative for agitation and behavioral problems.       Objective:    Physical Exam Vitals reviewed.  Constitutional:      General: She is not in acute distress.    Appearance: She is not diaphoretic.  HENT:     Head: Normocephalic and atraumatic.     Nose: Nose normal. No congestion.     Mouth/Throat:     Mouth: Mucous membranes are moist.     Pharynx: No posterior oropharyngeal erythema.  Eyes:     General: No scleral icterus.    Extraocular Movements: Extraocular movements intact.  Cardiovascular:     Rate and Rhythm: Normal rate and regular rhythm.     Pulses: Normal pulses.     Heart sounds: Normal heart sounds. No murmur heard. Pulmonary:     Breath sounds: Normal breath sounds. No wheezing or rales.  Musculoskeletal:     Cervical back: Neck supple. No tenderness.     Right lower leg: No edema.     Left lower  leg: No edema.     Comments: ROM of the lumbar spine limited due to pain, point tenderness in the left lumbar paraspinal area SLR positive b/l  Skin:    General: Skin is warm.     Findings: No rash.     Comments: Incision site C/D/I  Neurological:     General: No focal deficit present.     Mental Status: She is alert and  oriented to person, place, and time.  Psychiatric:        Mood and Affect: Mood normal.        Behavior: Behavior normal.     BP 106/71 (BP Location: Left Arm, Patient Position: Sitting, Cuff Size: Normal)   Pulse 99   Ht 5\' 7"  (1.702 m)   Wt 217 lb 3.2 oz (98.5 kg)   SpO2 91%   BMI 34.02 kg/m  Wt Readings from Last 3 Encounters:  01/28/23 217 lb 3.2 oz (98.5 kg)  01/11/23 215 lb (97.5 kg)  01/07/23 214 lb 6.4 oz (97.3 kg)    Lab Results  Component Value Date   TSH 1.480 08/05/2022   Lab Results  Component Value Date   WBC 6.4 08/05/2022   HGB 13.4 08/05/2022   HCT 40.4 08/05/2022   MCV 82 08/05/2022   PLT 254 08/05/2022   Lab Results  Component Value Date   NA 139 08/05/2022   K 4.1 08/05/2022   CO2 19 (L) 08/05/2022   GLUCOSE 92 08/05/2022   BUN 9 08/05/2022   CREATININE 0.78 08/05/2022   BILITOT 0.4 08/05/2022   ALKPHOS 61 08/05/2022   AST 12 08/05/2022   ALT 12 08/05/2022   PROT 6.5 08/05/2022   ALBUMIN 4.0 08/05/2022   CALCIUM 9.0 08/05/2022   ANIONGAP 7 02/17/2022   EGFR 103 08/05/2022   Lab Results  Component Value Date   CHOL 187 08/05/2022   Lab Results  Component Value Date   HDL 35 (L) 08/05/2022   Lab Results  Component Value Date   LDLCALC 135 (H) 08/05/2022   Lab Results  Component Value Date   TRIG 94 08/05/2022   Lab Results  Component Value Date   CHOLHDL 5.3 (H) 08/05/2022   Lab Results  Component Value Date   HGBA1C 6.1 (H) 08/05/2022      Assessment & Plan:   Problem List Items Addressed This Visit       Nervous and Auditory   Bilateral sciatica - Primary    Has bilateral buttock area pain and radiating symptoms into bilateral LE Completed oral steroids recently, would avoid repeat due to oral steroids Continue gabapentin 300 mg twice daily Switched from tizanidine to Robaxin Avoid heavy lifting and frequent bending Offered PT, but she is awaiting neurosurgery evaluation in the next week      Relevant  Medications   methocarbamol (ROBAXIN) 750 MG tablet     Musculoskeletal and Integument   HNP (herniated nucleus pulposus), lumbar    S/p lumbar laminecomy with microdiscectomy in 10/22 S/p L5-S1 re-do microdiscectomy in 06/23 Recent CT of lumbar spine reviewed Completed PT, needs to do back exercises at home Advised to f/u with Spine surgeon Increased gabapentin to 300 mg twice daily recently Switch from tizanidine to Robaxin as needed for back muscle spasms Naproxen as needed for back pain Was recently given prednisone, avoid recurrent steroid use Avoid heavy lifting and frequent bending      Relevant Medications   methocarbamol (ROBAXIN) 750 MG tablet     Other  Spinal stenosis of lumbar region with neurogenic claudication    Follows with Spine specialist Avoid heavy lifting Previous MRI of lumbar spine showed spinal stenosis, which can explain her recurrent bilateral back pain      Relevant Medications   methocarbamol (ROBAXIN) 750 MG tablet   Encounter for examination following treatment at hospital    ER chart reviewed, including imaging Was given oral steroids Increase gabapentin to 300 mg twice daily Switched muscle relaxer to Robaxin as needed for back muscle spasms Work note provided      Meds ordered this encounter  Medications   methocarbamol (ROBAXIN) 750 MG tablet    Sig: Take 1 tablet (750 mg total) by mouth 2 (two) times daily as needed for muscle spasms.    Dispense:  30 tablet    Refill:  2    Follow-up: Return if symptoms worsen or fail to improve.    Anabel Halon, MD

## 2023-01-28 NOTE — Assessment & Plan Note (Signed)
S/p lumbar laminecomy with microdiscectomy in 10/22 S/p L5-S1 re-do microdiscectomy in 06/23 Recent CT of lumbar spine reviewed Completed PT, needs to do back exercises at home Advised to f/u with Spine surgeon Increased gabapentin to 300 mg twice daily recently Switch from tizanidine to Robaxin as needed for back muscle spasms Naproxen as needed for back pain Was recently given prednisone, avoid recurrent steroid use Avoid heavy lifting and frequent bending

## 2023-01-28 NOTE — Patient Instructions (Addendum)
Please take Robaxin as needed for muscle spasms instead of Tizanidine.  Please continue taking Gabapentin.  Please avoid heavy lifting and frequent bending.

## 2023-02-02 DIAGNOSIS — M5416 Radiculopathy, lumbar region: Secondary | ICD-10-CM | POA: Diagnosis not present

## 2023-02-02 DIAGNOSIS — Z6834 Body mass index (BMI) 34.0-34.9, adult: Secondary | ICD-10-CM | POA: Diagnosis not present

## 2023-02-20 ENCOUNTER — Encounter (HOSPITAL_COMMUNITY): Payer: Self-pay | Admitting: Emergency Medicine

## 2023-02-20 ENCOUNTER — Telehealth: Payer: Self-pay | Admitting: Internal Medicine

## 2023-02-20 ENCOUNTER — Emergency Department (HOSPITAL_COMMUNITY)
Admission: EM | Admit: 2023-02-20 | Discharge: 2023-02-20 | Disposition: A | Discharge status: Home / Self Care | Payer: Federal, State, Local not specified - PPO | Attending: Emergency Medicine | Admitting: Emergency Medicine

## 2023-02-20 ENCOUNTER — Other Ambulatory Visit: Payer: Self-pay

## 2023-02-20 DIAGNOSIS — G8929 Other chronic pain: Secondary | ICD-10-CM

## 2023-02-20 DIAGNOSIS — M5441 Lumbago with sciatica, right side: Secondary | ICD-10-CM | POA: Diagnosis not present

## 2023-02-20 DIAGNOSIS — M5442 Lumbago with sciatica, left side: Secondary | ICD-10-CM | POA: Insufficient documentation

## 2023-02-20 DIAGNOSIS — Z91040 Latex allergy status: Secondary | ICD-10-CM | POA: Insufficient documentation

## 2023-02-20 DIAGNOSIS — M545 Low back pain, unspecified: Secondary | ICD-10-CM | POA: Diagnosis not present

## 2023-02-20 LAB — URINALYSIS, ROUTINE W REFLEX MICROSCOPIC
Bilirubin Urine: NEGATIVE
Glucose, UA: NEGATIVE mg/dL
Hgb urine dipstick: NEGATIVE
Ketones, ur: NEGATIVE mg/dL
Leukocytes,Ua: NEGATIVE
Nitrite: NEGATIVE
Protein, ur: NEGATIVE mg/dL
Specific Gravity, Urine: 1.026 (ref 1.005–1.030)
pH: 5 (ref 5.0–8.0)

## 2023-02-20 LAB — PREGNANCY, URINE: Preg Test, Ur: NEGATIVE

## 2023-02-20 MED ORDER — CYCLOBENZAPRINE HCL 10 MG PO TABS
10.0000 mg | ORAL_TABLET | Freq: Once | ORAL | Status: AC
  Administered 2023-02-20: 10 mg via ORAL
  Filled 2023-02-20: qty 1

## 2023-02-20 MED ORDER — CYCLOBENZAPRINE HCL 10 MG PO TABS: 10.0000 mg | ORAL_TABLET | Freq: Two times a day (BID) | ORAL | 0 refills | Status: DC | PRN

## 2023-02-20 MED ORDER — LIDOCAINE 5 % EX PTCH: 1.0000 | MEDICATED_PATCH | CUTANEOUS | 0 refills | Status: DC

## 2023-02-20 MED ORDER — LIDOCAINE 5 % EX PTCH
2.0000 | MEDICATED_PATCH | CUTANEOUS | Status: DC
  Administered 2023-02-20: 2 via TRANSDERMAL
  Filled 2023-02-20: qty 2
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
## 2023-02-20 NOTE — ED Notes (Signed)
ED Provider at bedside. 

## 2023-02-20 NOTE — Telephone Encounter (Signed)
Letter provided, sent via mychart, patient advised

## 2023-02-20 NOTE — ED Provider Notes (Cosign Needed Addendum)
[place] EMERGENCY DEPARTMENT AT [facility] Provider Note   CSN: [id number] Arrival date & time: 02/20/23  1649     History  Chief Complaint  Patient presents with   Back Pain    Kaylee Miller is a 32 y.o. female status post multiple lumbar laminectomies, history of herniated pulses in the lumbar region, spinal stenosis of lumbar region, bilateral sciatica presented for back pain that is been present for years.  Patient states that this is her chronic back pain however yesterday it was exacerbated while she was sitting down for long periods of time.  Patient states she has shooting pains down both of her legs when she sits for long periods of time.  Patient was recently seen in the ER and placed on steroids for her back pain however she states that her back specialist does not want to take any more steroids and that she has MRI scheduled in the outpatient setting.  Patient denies any urinary/bowel incontinence, changes sensation/motor skills, weakness, saddle anesthesia.  Patient denied trauma, fevers  Home Medications Prior to Admission medications   Medication Sig Start Date End Date Taking? Authorizing Provider  cyclobenzaprine (FLEXERIL) 10 MG tablet Take 1 tablet (10 mg total) by mouth 2 (two) times daily as needed for muscle spasms. 02/20/23  Yes Metta Koranda, Beverly Gust, PA-C  albuterol (VENTOLIN HFA) 108 (90 Base) MCG/ACT inhaler Inhale 2 puffs into the lungs every 6 (six) hours as needed for wheezing or shortness of breath. 06/10/20   Devoria Albe, MD  busPIRone (BUSPAR) 5 MG tablet Take 1 tablet (5 mg total) by mouth 2 (two) times daily. 06/03/22   Adline Potter, NP  docusate sodium (COLACE) 100 MG capsule Take 200 mg by mouth daily.    [provider]  escitalopram (LEXAPRO) 10 MG tablet Take 1 tablet (10 mg total) by mouth daily. 06/09/22   Anabel Halon, MD  fluticasone (FLONASE) 50 MCG/ACT nasal spray Place 2 sprays into both nostrils daily as needed  for allergies or rhinitis.    [provider]  gabapentin (NEURONTIN) 300 MG capsule Take 1 capsule (300 mg total) by mouth 2 (two) times daily. 01/07/23   Anabel Halon, MD  ibuprofen (ADVIL) 800 MG tablet Take 800 mg by mouth every 8 (eight) hours as needed.    [provider]  medroxyPROGESTERone Acetate 150 MG/ML SUSY Inject 1 mL (150 mg total) into the muscle every 3 (three) months. INJECT 1 ML INTO THE MUSCLE EVERY 3 MONTHS Strength: 150 mg/mL 04/23/22   Cyril Mourning A, NP  Melatonin 5 MG CAPS Take 5 mg by mouth at bedtime as needed (sleep).    [provider]  naproxen (NAPROSYN) 500 MG tablet Take 1 tablet (500 mg total) by mouth 2 (two) times daily with a meal. 12/01/22   Patel, Earlie Lou, MD  ondansetron (ZOFRAN-ODT) 4 MG disintegrating tablet DISSOLVE 1 TABLET IN MOUTH EVERY 8 HOURS AS NEEDED FOR NAUSEA OR VOMITING 06/13/22   Adline Potter, NP      Allergies    Latex    Review of Systems   Review of Systems  Musculoskeletal:  Positive for back pain.    Physical Exam Updated Vital Signs BP 108/89 (BP Location: Right Arm)   Pulse 84   Temp 98.3 F (36.8 C) (Oral)   Resp 17   Ht 5\' 7"  (1.702 m)   Wt 99.8 kg   SpO2 100%   BMI 34.46 kg/m  Physical Exam  Constitutional:      General: She is not in acute distress. Cardiovascular:     Pulses: Normal pulses.  Musculoskeletal:     Comments: 5 out of 5 bilateral hip flexion/knee extension Tenderness to palpation in bilateral paralumbar muscles No midline tenderness or abnormalities palpated Positive straight leg test bilaterally  Skin:    General: Skin is warm and dry.     Capillary Refill: Capillary refill takes less than 2 seconds.  Neurological:     Mental Status: She is alert.     Comments: Sensation intact distally in bilateral lower limbs Intact bilateral patellar reflexes  Psychiatric:        Mood and Affect: Mood normal.     ED Results / Procedures / Treatments   Labs (all  labs ordered are listed, but only abnormal results are displayed) Labs Reviewed  URINALYSIS, ROUTINE W REFLEX MICROSCOPIC  PREGNANCY, URINE    EKG None  Radiology No results found.  Procedures Procedures    Medications Ordered in ED Medications  lidocaine (LIDODERM) 5 % 2 patch (has no administration in time range)    ED Course/ Medical Decision Making/ A&P                             Medical Decision Making Amount and/or Complexity of Data Reviewed Labs: ordered.  Risk Prescription drug management.   Kaylee Miller 32 y.o. presented today for back pain. Working DDx that I considered at this time includes, but not limited to, MSK, underlying fracture, epidural hematoma, cauda equina syndrome, spinal stenosis, spinal malignancy, dural abscess, discitis, spinal infection.  R/o DDx: underlying fracture, epidural hematoma, cauda equina syndrome, spinal stenosis, spinal malignancy, dural abscess, discitis, spinal infection : less likely due to history of present illness and physical exam findings  Review of prior external notes: 01/11/2023 ED provider  Unique Tests and My Interpretation: None  Discussion with Independent Historian: None  Discussion of Management of Tests: None  Risk: Medium: prescription drug management  Risk Stratification Score: None  Plan: On exam patient was in no acute distress and stable vitals.  Patient's physical exam was overall unremarkable including her neurologic exam.  Patient states that this feels like an exacerbation of her chronic back pain and that she already sees spine and has MRI planned in the outpatient setting.  Patient not endorse any red flag symptoms and after speaking with her patient is okay to with proceeding without imaging and to treat her symptoms.  Patient states she does not want steroids as per her back specialist and states that narcotics do not help her much.  Patient is already on gabapentin and I encouraged her  to keep taking that.  Patient is taking methocarbamol with no relief and so we will switch that to Flexeril.  I encouraged patient to use heat packs on her back as they seem to help along with prescribing her lidocaine patches.  Patient given lidocaine patch before discharge and encouraged follow-up with her back specialist.  Patient was given return precautions. Patient stable for discharge at this time.  Patient verbalized understanding of plan.  At time of discharge patient stated that she wanted a Flexeril and that could have her son pick her up.  Flexeril will be prescribed and patient will be discharged.       Final Clinical Impression(s) / ED Diagnoses Final diagnoses:  Chronic bilateral low back pain with bilateral sciatica    Rx /  DC Orders ED Discharge Orders          Ordered    cyclobenzaprine (FLEXERIL) 10 MG tablet  2 times daily PRN        02/20/23 1937              Remi Deter 02/20/23 1949    Netta Corrigan, PA-C 02/20/23 1957    Terrilee Files, MD 02/21/23 1017

## 2023-02-20 NOTE — Telephone Encounter (Signed)
Patient called in regard to work note. Patient states that job is needing a note stating that she cannot do any heavy lifting, twisting, or bending.   Patient wants a call back. Also would like to have note sent via mychart as well as a hard copy.  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

## 2023-02-20 NOTE — Discharge Instructions (Signed)
Please follow-up with your back specialist regarding recent symptoms and ER visit.  Today your physical exam is reassuring and as we discussed imaging was not required at this time.  We agreed to change out the muscle relaxers you are on and so I have prescribed for you Flexeril to take.  Please do not operate machinery or drive while taking this medication as it can make you drowsy.  Please take this at night.  I have also prescribed for you lidocaine patches for topical relief.  If symptoms change or worsen please return to ER.

## 2023-03-16 DIAGNOSIS — M4807 Spinal stenosis, lumbosacral region: Secondary | ICD-10-CM | POA: Diagnosis not present

## 2023-03-17 ENCOUNTER — Encounter: Payer: Self-pay | Admitting: Internal Medicine

## 2023-03-17 ENCOUNTER — Telehealth (INDEPENDENT_AMBULATORY_CARE_PROVIDER_SITE_OTHER): Payer: Federal, State, Local not specified - PPO | Admitting: Internal Medicine

## 2023-03-17 ENCOUNTER — Other Ambulatory Visit: Payer: Self-pay | Admitting: Internal Medicine

## 2023-03-17 DIAGNOSIS — F17210 Nicotine dependence, cigarettes, uncomplicated: Secondary | ICD-10-CM | POA: Diagnosis not present

## 2023-03-17 DIAGNOSIS — J45909 Unspecified asthma, uncomplicated: Secondary | ICD-10-CM

## 2023-03-17 DIAGNOSIS — J4521 Mild intermittent asthma with (acute) exacerbation: Secondary | ICD-10-CM

## 2023-03-17 MED ORDER — METHYLPREDNISOLONE 4 MG PO TBPK: ORAL_TABLET | ORAL | 0 refills | Status: DC

## 2023-03-17 MED ORDER — ALBUTEROL SULFATE HFA 108 (90 BASE) MCG/ACT IN AERS
2.0000 | INHALATION_SPRAY | Freq: Four times a day (QID) | RESPIRATORY_TRACT | 1 refills | Status: AC | PRN
Start: 2023-03-17 — End: ?

## 2023-03-17 NOTE — Assessment & Plan Note (Addendum)
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

## 2023-03-17 NOTE — Telephone Encounter (Signed)
Appt scheduled pt aware  

## 2023-03-17 NOTE — Patient Instructions (Signed)
Please take Prednisone as prescribed.  Please use Albuterol as needed for shortness of breath or wheezing.

## 2023-03-17 NOTE — Progress Notes (Signed)
Virtual Visit via Video Note   Because of [name]'s co-morbid illnesses, she is at least at moderate risk for complications without adequate follow up.  This format is felt to be most appropriate for this patient at this time.  All issues noted in this document were discussed and addressed.  A limited physical exam was performed with this format.      Evaluation Performed:  Follow-up visit  Date:  03/17/2023   ID:  Miller, Kaylee 1991-01-23, MRN [id number]  Patient Location: Home Provider Location: Office/Clinic  Participants: Patient Location of Patient: Home Location of Provider: Telehealth Consent was obtain for visit to be over via telehealth. I verified that I am speaking with the correct person using two identifiers.  PCP:  Anabel Halon, MD   Chief Complaint: Shortness of breath and wheezing  History of Present Illness:    Kaylee Miller is a 32 y.o. female who has a video visit for complaint of shortness of breath and wheezing for the last 2 days.  She also reports coughing.  She has history of mild, intermittent asthma and she had run out of her albuterol inhaler.  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  Denies any fever, chills, hemoptysis.  The patient does not have symptoms concerning for COVID-19 infection (fever, chills, cough, or new shortness of breath).   Past Medical, Surgical, Social History, Allergies, and Medications have been Reviewed.  Past Medical History:  Diagnosis Date   Anemia    Anxiety    Asthma    [employer]reesia 07/02/2020   Contraceptive management 02/08/2015   COVID    mild case   CSF leak 01/29/2022   Depression    GERD (gastroesophageal reflux disease)    Headache(784.0)    migraines   History of bronchitis    HSV-2 (herpes simplex virus 2) infection    Pneumonia    Pregnant    S/P C-section 07/28/2013   Sciatic nerve pain    Seasonal allergies     Vaginal Pap smear, abnormal    Past Surgical History:  Procedure Laterality Date   adenoids     BACK SURGERY     CESAREAN SECTION  08/25/2005   CESAREAN SECTION N/A 07/28/2013   Procedure: CESAREAN SECTION;  Surgeon: [name], MD;  Location: [facility];  Service: Obstetrics;  Laterality: N/A;   LUMBAR LAMINECTOMY/DECOMPRESSION MICRODISCECTOMY Right 05/25/2021   Procedure: DISCECTOMY Lumbar five - Sacral one;  Surgeon: Coletta Memos, MD;  Location: [facility] OR;  Service: Neurosurgery;  Laterality: Right;   LUMBAR LAMINECTOMY/DECOMPRESSION MICRODISCECTOMY Right 01/15/2022   Procedure: Right Lumbar Five-Sacral One Microdiscectomy;  Surgeon: Coletta Memos, MD;  Location: [facility] OR;  Service: Neurosurgery;  Laterality: Right;  3C/RM 19   LUMBAR LAMINECTOMY/DECOMPRESSION MICRODISCECTOMY N/A 01/30/2022   Procedure: REVISION L5-S1 LAMINECTOMY REPAIR CSS LEAK;  Surgeon: Dawley, Alan Mulder, DO;  Location: [facility] OR;  Service: Neurosurgery;  Laterality: N/A;   LUMBAR LAMINECTOMY/DECOMPRESSION MICRODISCECTOMY N/A 02/19/2022   Procedure: Redo Lumbar five-Sacral one Microdiscectomy;  Surgeon: Coletta Memos, MD;  [facility];  Service: Neurosurgery;  Laterality: N/A;   tubes in ears       Current Meds  Medication Sig   methylPREDNISolone (MEDROL DOSEPAK) 4 MG TBPK tablet Take as package instructions.     Allergies:   Latex   ROS:   Please see the history of present illness.     All other systems reviewed  and are negative.   Labs/Other Tests and Data Reviewed:    Recent Labs: 08/05/2022: ALT 12; BUN 9; Creatinine, Ser 0.78; Hemoglobin 13.4; Platelets 254; Potassium 4.1; Sodium 139; TSH 1.480   Recent Lipid Panel Lab Results  Component Value Date/Time   CHOL 187 08/05/2022 09:14 AM   TRIG 94 08/05/2022 09:14 AM   HDL 35 (L) 08/05/2022 09:14 AM   CHOLHDL 5.3 (H) 08/05/2022 09:14 AM   LDLCALC 135 (H) 08/05/2022 09:14 AM    Wt Readings from Last 3 Encounters:  02/20/23 220 lb (99.8 kg)  01/28/23  217 lb 3.2 oz (98.5 kg)  01/11/23 215 lb (97.5 kg)     Objective:    Vital Signs:  There were no vitals taken for this visit.   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 The patient was provided an opportunity to ask questions, and all were answered. The patient agreed with the plan and demonstrated an understanding of the instructions.   The patient was advised to call back or seek an in-person evaluation if the symptoms worsen or if the condition fails to improve as anticipated.  The above assessment and management plan was discussed with the patient. The patient verbalized understanding of and has agreed to the management plan.   Medication Adjustments/Labs and Tests Ordered: Current medicines are reviewed at length with the patient today.  Concerns regarding medicines are outlined above.   Tests Ordered: No orders of the defined types were placed in this encounter.   Medication Changes: Meds ordered this encounter  Medications   methylPREDNISolone (MEDROL DOSEPAK) 4 MG TBPK tablet    Sig: Take as package instructions.    Dispense:  1 each    Refill:  0     Note: This dictation was prepared with Dragon dictation along with smaller phrase technology. Similar sounding words can be transcribed inadequately or may not be corrected upon  review. Any transcriptional errors that result from this process are unintentional.      Disposition:  Follow up  Signed, Anabel Halon, MD  03/17/2023 4:07 PM     [facility]

## 2023-03-24 ENCOUNTER — Ambulatory Visit: Payer: Federal, State, Local not specified - PPO | Admitting: Internal Medicine

## 2023-03-31 DIAGNOSIS — M5416 Radiculopathy, lumbar region: Secondary | ICD-10-CM | POA: Diagnosis not present

## 2023-03-31 DIAGNOSIS — Z6834 Body mass index (BMI) 34.0-34.9, adult: Secondary | ICD-10-CM | POA: Diagnosis not present

## 2023-04-02 ENCOUNTER — Encounter: Payer: Self-pay | Admitting: Internal Medicine

## 2023-04-02 ENCOUNTER — Ambulatory Visit: Payer: Federal, State, Local not specified - PPO | Admitting: Internal Medicine

## 2023-04-02 VITALS — BP 124/80 | HR 83 | Ht 67.0 in | Wt 226.2 lb

## 2023-04-02 DIAGNOSIS — M5126 Other intervertebral disc displacement, lumbar region: Secondary | ICD-10-CM

## 2023-04-02 DIAGNOSIS — K2950 Unspecified chronic gastritis without bleeding: Secondary | ICD-10-CM

## 2023-04-02 DIAGNOSIS — F32A Depression, unspecified: Secondary | ICD-10-CM

## 2023-04-02 DIAGNOSIS — Z0001 Encounter for general adult medical examination with abnormal findings: Secondary | ICD-10-CM

## 2023-04-02 DIAGNOSIS — F419 Anxiety disorder, unspecified: Secondary | ICD-10-CM | POA: Diagnosis not present

## 2023-04-02 DIAGNOSIS — Z720 Tobacco use: Secondary | ICD-10-CM

## 2023-04-02 DIAGNOSIS — R7303 Prediabetes: Secondary | ICD-10-CM | POA: Diagnosis not present

## 2023-04-02 DIAGNOSIS — J4520 Mild intermittent asthma, uncomplicated: Secondary | ICD-10-CM

## 2023-04-02 MED ORDER — OMEPRAZOLE 40 MG PO CPDR
40.0000 mg | DELAYED_RELEASE_CAPSULE | Freq: Every day | ORAL | 3 refills | Status: AC
Start: 2023-04-02 — End: ?

## 2023-04-02 MED ORDER — BUSPIRONE HCL 5 MG PO TABS
5.0000 mg | ORAL_TABLET | Freq: Two times a day (BID) | ORAL | 4 refills | Status: DC
Start: 2023-04-02 — End: 2024-05-11

## 2023-04-02 NOTE — Assessment & Plan Note (Signed)
S/p lumbar laminecomy with microdiscectomy in 10/22 S/p L5-S1 re-do microdiscectomy in 06/23 Recent CT of lumbar spine reviewed Completed PT, needs to do back exercises at home Advised to f/u with Spine surgeon Increased gabapentin to 300 mg twice daily recently Flexeril as needed for back muscle spasms Naproxen as needed for back pain Was recently given prednisone, avoid recurrent steroid use Avoid heavy lifting and frequent bending

## 2023-04-02 NOTE — Patient Instructions (Signed)
Please take Omeprazole once daily for 1 week and then as needed for abdominal pain.  Please avoid hot and spicy food.  Please cut down -> quit smoking.  Please start taking Buspirone twice daily.

## 2023-04-02 NOTE — Assessment & Plan Note (Signed)
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

## 2023-04-02 NOTE — Assessment & Plan Note (Addendum)
Uncontrolled On Lexapro 10 mg daily Needs to take BuSpar 5 mg twice daily

## 2023-04-02 NOTE — Assessment & Plan Note (Signed)
Lab Results  Component Value Date   HGBA1C 6.1 (H) 08/05/2022   Advised to follow low carb diet

## 2023-04-02 NOTE — Assessment & Plan Note (Signed)
Her epigastric pain and nausea are likely due to chronic gastritis Needs to avoid oral NSAIDs if possible Added omeprazole 40 mg daily for now Avoid hot and spicy food

## 2023-04-02 NOTE — Assessment & Plan Note (Signed)
Smokes 0.5 pack/day  Asked about quitting: confirms that she currently smokes cigarettes Advise to quit smoking: Educated about QUITTING to reduce the risk of cancer, cardio and cerebrovascular disease. Assess willingness: Unwilling to quit at this time, but is working on cutting back. Assist with counseling and pharmacotherapy: Counseled for 5 minutes and literature provided. Arrange for follow up: Follow up and continue to offer help. 

## 2023-04-02 NOTE — Progress Notes (Signed)
Established Patient Office Visit  Subjective:  Patient ID: Kaylee Miller, female    DOB: 1990/10/18  Age: 32 y.o. MRN: [id number]  CC:  Chief Complaint  Patient presents with   Annual Exam    HPI Kaylee Miller is a 32 y.o. female with past medical history of MDD, asthma and lumbar spinal stenosis who presents for annual physical.  MDD: She reports apathy, insomnia and lack of appetite.  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  She currently takes Lexapro 10 mg daily.  She has stopped taking BuSpar, but agrees to start taking it again.  Denies any SI or HI currently.  Gastritis: She reports intermittent nausea after having small meals.  Denies any recent vomiting.  She has tried Pepto-Bismol with some relief.  She has noted epigastric burning at times.  Denies any melena or hematochezia.  Lumbar disc herniation: She has had lumbar laminectomy with microdiscectomy and revision surgeries for chronic low back pain. She is currently taking gabapentin 300 mg twice daily and as needed Flexeril.  She also takes naproxen as needed for the pain.  Past Medical History:  Diagnosis Date   Anemia    Anxiety    Asthma    Phreesia 07/02/2020   Contraceptive management 02/08/2015   COVID    mild case   CSF leak 01/29/2022   Depression    GERD (gastroesophageal reflux disease)    Headache(784.0)    migraines   History of bronchitis    HSV-2 (herpes simplex virus 2) infection    Pneumonia    Pregnant    S/P C-section 07/28/2013   Sciatic nerve pain    Seasonal allergies    Vaginal Pap smear, abnormal     Past Surgical History:  Procedure Laterality Date   adenoids     BACK SURGERY     CESAREAN SECTION  08/25/2005   CESAREAN SECTION N/A 07/28/2013   Procedure: CESAREAN SECTION;  Surgeon: [name], MD;  Location: [facility];  Service: Obstetrics;  Laterality: N/A;   LUMBAR LAMINECTOMY/DECOMPRESSION MICRODISCECTOMY Right  05/25/2021   Procedure: DISCECTOMY Lumbar five - Sacral one;  Surgeon: Coletta Memos, MD;  Location: [facility] OR;  Service: Neurosurgery;  Laterality: Right;   LUMBAR LAMINECTOMY/DECOMPRESSION MICRODISCECTOMY Right 01/15/2022   Procedure: Right Lumbar Five-Sacral One Microdiscectomy;  Surgeon: Coletta Memos, MD;  Location: [facility] OR;  Service: Neurosurgery;  Laterality: Right;  3C/RM 19   LUMBAR LAMINECTOMY/DECOMPRESSION MICRODISCECTOMY N/A 01/30/2022   Procedure: REVISION L5-S1 LAMINECTOMY REPAIR CSS LEAK;  Surgeon: Dawley, Alan Mulder, DO;  Location: [facility] OR;  Service: Neurosurgery;  Laterality: N/A;   LUMBAR LAMINECTOMY/DECOMPRESSION MICRODISCECTOMY N/A 02/19/2022   Procedure: Redo Lumbar five-Sacral one Microdiscectomy;  Surgeon: Coletta Memos, MD;  Location: [facility];  Service: Neurosurgery;  Laterality: N/A;   tubes in ears      Family History  Problem Relation Age of Onset   Cancer Mother    Breast cancer Mother    Hypertension Father    Stroke Father    CAD Maternal Grandfather    CAD Paternal Grandmother    CAD Paternal Grandfather    Diabetes Other        great-great grandmother   CAD Other     Social History   Socioeconomic History   Marital status: Single    Spouse name: Not on file   Number of children: Not on file   Years of education: Not on file  Highest education level: 12th grade  Occupational History   Not on file  Tobacco Use   Smoking status: Every Day    Current packs/day: 0.50    Average packs/day: 0.5 packs/day for 9.0 years (4.5 ttl pk-yrs)    Types: Cigarettes    Passive exposure: Never   Smokeless tobacco: Never  Vaping Use   Vaping status: Never Used  Substance and Sexual Activity   Alcohol use: Not Currently    Comment: Occa   Drug use: Never   Sexual activity: Not Currently    Birth control/protection: Injection  Other Topics Concern   Not on file  Social History Narrative   Not on file   Social Determinants of Health   Financial Resource Strain:  Medium Risk (11/27/2022)   Overall Financial Resource Strain (CARDIA)    Difficulty of Paying Living Expenses: Somewhat hard  Food Insecurity: Food Insecurity Present (11/27/2022)   Hunger Vital Sign    Worried About Running Out of Food in the Last Year: Sometimes true    Ran Out of Food in the Last Year: Never true  Transportation Needs: No Transportation Needs (11/27/2022)   PRAPARE - Administrator, Civil Service (Medical): No    Lack of Transportation (Non-Medical): No  Physical Activity: Insufficiently Active (11/27/2022)   Exercise Vital Sign    Days of Exercise per Week: 2 days    Minutes of Exercise per Session: 10 min  Stress: No Stress Concern Present (11/27/2022)   [employer] of Occupational Health - Occupational Stress Questionnaire    Feeling of Stress : Only a little  Social Connections: Unknown (02/25/2023)   [facility] from [facility]   Social Network    Social Network: Not on file  Recent Concern: Social Connections - Moderately Isolated (11/27/2022)   Social Connection and Isolation Panel [NHANES]    Frequency of Communication with Friends and Family: More than three times a week    Frequency of Social Gatherings with Friends and Family: Once a week    Attends Religious Services: 1 to 4 times per year    Active Member of [employer] or Organizations: No    Attends Engineer, structural: Not on file    Marital Status: Never married  Intimate Partner Violence: Unknown (02/25/2023)   Received from [facility]   HITS    Physically Hurt: Not on file    Insult or Talk Down To: Not on file    Threaten Physical Harm: Not on file    Scream or Curse: Not on file    Outpatient Medications Prior to Visit  Medication Sig Dispense Refill   albuterol (VENTOLIN HFA) 108 (90 Base) MCG/ACT inhaler Inhale 2 puffs into the lungs every 6 (six) hours as needed for wheezing or shortness of breath. 18 g 1   cyclobenzaprine (FLEXERIL) 10 MG tablet Take 1 tablet (10 mg total)  by mouth 2 (two) times daily as needed for muscle spasms. 20 tablet 0   docusate sodium (COLACE) 100 MG capsule Take 200 mg by mouth daily.     escitalopram (LEXAPRO) 10 MG tablet Take 1 tablet (10 mg total) by mouth daily. 90 tablet 1   fluticasone (FLONASE) 50 MCG/ACT nasal spray Place 2 sprays into both nostrils daily as needed for allergies or rhinitis.     gabapentin (NEURONTIN) 300 MG capsule Take 1 capsule (300 mg total) by mouth 2 (two) times daily. 60 capsule 3   lidocaine (LIDODERM) 5 % Place 1 patch onto  the skin daily. Remove & Discard patch within 12 hours or as directed by MD 30 patch 0   medroxyPROGESTERone Acetate 150 MG/ML SUSY Inject 1 mL (150 mg total) into the muscle every 3 (three) months. INJECT 1 ML INTO THE MUSCLE EVERY 3 MONTHS Strength: 150 mg/mL 1 mL 4   Melatonin 5 MG CAPS Take 5 mg by mouth at bedtime as needed (sleep).     naproxen (NAPROSYN) 500 MG tablet Take 1 tablet (500 mg total) by mouth 2 (two) times daily with a meal. 60 tablet 3   ondansetron (ZOFRAN-ODT) 4 MG disintegrating tablet DISSOLVE 1 TABLET IN MOUTH EVERY 8 HOURS AS NEEDED FOR NAUSEA OR VOMITING 20 tablet 0   busPIRone (BUSPAR) 5 MG tablet Take 1 tablet (5 mg total) by mouth 2 (two) times daily. 60 tablet 4   ibuprofen (ADVIL) 800 MG tablet Take 800 mg by mouth every 8 (eight) hours as needed.     methylPREDNISolone (MEDROL DOSEPAK) 4 MG TBPK tablet Take as package instructions. 1 each 0   No facility-administered medications prior to visit.    Allergies  Allergen Reactions   Latex Swelling    ROS Review of Systems  Constitutional:  Negative for chills and fever.  HENT:  Negative for congestion, sinus pressure, sinus pain and sore throat.   Eyes:  Negative for pain and discharge.  Respiratory:  Negative for cough and shortness of breath.   Cardiovascular:  Negative for chest pain and palpitations.  Gastrointestinal:  Positive for abdominal pain (Epigastric), constipation and nausea.  Negative for diarrhea and vomiting.  Endocrine: Negative for polydipsia and polyuria.  Genitourinary:  Negative for dysuria and hematuria.  Musculoskeletal:  Positive for back pain and neck pain. Negative for neck stiffness.  Skin:  Negative for rash.  Neurological:  Negative for dizziness, speech difficulty, weakness and headaches.  Psychiatric/Behavioral:  Negative for agitation and behavioral problems.       Objective:    Physical Exam Vitals reviewed.  Constitutional:      General: She is not in acute distress.    Appearance: She is not diaphoretic.  HENT:     Head: Normocephalic and atraumatic.     Nose: Nose normal. No congestion.     Mouth/Throat:     Mouth: Mucous membranes are moist.     Pharynx: No posterior oropharyngeal erythema.  Eyes:     General: No scleral icterus.    Extraocular Movements: Extraocular movements intact.  Cardiovascular:     Rate and Rhythm: Normal rate and regular rhythm.     Pulses: Normal pulses.     Heart sounds: Normal heart sounds. No murmur heard. Pulmonary:     Breath sounds: Normal breath sounds. No wheezing or rales.  Musculoskeletal:     Cervical back: Neck supple. No tenderness.     Right lower leg: No edema.     Left lower leg: No edema.     Comments: ROM of the lumbar spine limited due to pain, point tenderness in the left lumbar paraspinal area SLR positive b/l  Skin:    General: Skin is warm.     Findings: No rash.     Comments: Incision site C/D/I  Neurological:     General: No focal deficit present.     Mental Status: She is alert and oriented to person, place, and time.  Psychiatric:        Mood and Affect: Mood normal.        Behavior: Behavior normal.  BP 124/80   Pulse 83   Ht 5\' 7"  (1.702 m)   Wt 226 lb 3.2 oz (102.6 kg)   SpO2 98%   BMI 35.43 kg/m  Wt Readings from Last 3 Encounters:  04/02/23 226 lb 3.2 oz (102.6 kg)  02/20/23 220 lb (99.8 kg)  01/28/23 217 lb 3.2 oz (98.5 kg)    Lab Results   Component Value Date   TSH 1.480 08/05/2022   Lab Results  Component Value Date   WBC 6.4 08/05/2022   HGB 13.4 08/05/2022   HCT 40.4 08/05/2022   MCV 82 08/05/2022   PLT 254 08/05/2022   Lab Results  Component Value Date   NA 139 08/05/2022   K 4.1 08/05/2022   CO2 19 (L) 08/05/2022   GLUCOSE 92 08/05/2022   BUN 9 08/05/2022   CREATININE 0.78 08/05/2022   BILITOT 0.4 08/05/2022   ALKPHOS 61 08/05/2022   AST 12 08/05/2022   ALT 12 08/05/2022   PROT 6.5 08/05/2022   ALBUMIN 4.0 08/05/2022   CALCIUM 9.0 08/05/2022   ANIONGAP 7 02/17/2022   EGFR 103 08/05/2022   Lab Results  Component Value Date   CHOL 187 08/05/2022   Lab Results  Component Value Date   HDL 35 (L) 08/05/2022   Lab Results  Component Value Date   LDLCALC 135 (H) 08/05/2022   Lab Results  Component Value Date   TRIG 94 08/05/2022   Lab Results  Component Value Date   CHOLHDL 5.3 (H) 08/05/2022   Lab Results  Component Value Date   HGBA1C 6.1 (H) 08/05/2022      Assessment & Plan:   Problem List Items Addressed This Visit       Respiratory   Mild asthma    Uses albuterol inhaler as needed, refilled -can be a good candidate for AirSupra if recurrent use of albuterol If recurrent  episodes of asthma exacerbation, may need maintenance inhaler Robitussin DM as needed for cough 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         Digestive   Chronic gastritis without bleeding    Her epigastric pain and nausea are likely due to chronic gastritis Needs to avoid oral NSAIDs if possible Added omeprazole 40 mg daily for now Avoid hot and spicy food      Relevant Medications   omeprazole (PRILOSEC) 40 MG capsule     Musculoskeletal and Integument   HNP (herniated nucleus pulposus), lumbar    S/p lumbar laminecomy with microdiscectomy in 10/22 S/p L5-S1 re-do microdiscectomy in 06/23 Recent CT of lumbar spine reviewed Completed PT, needs to do back exercises at home Advised  to f/u with Spine surgeon Increased gabapentin to 300 mg twice daily recently Flexeril as needed for back muscle spasms Naproxen as needed for back pain Was recently given prednisone, avoid recurrent steroid use Avoid heavy lifting and frequent bending        Other   Tobacco abuse    Smokes 0.5 pack/day  Asked about quitting: confirms that she currently smokes cigarettes Advise to quit smoking: Educated about QUITTING to reduce the risk of cancer, cardio and cerebrovascular disease. Assess willingness: Unwilling to quit at this time, but is working on cutting back. Assist with counseling and pharmacotherapy: Counseled for 5 minutes and literature provided. Arrange for follow up: Follow up and continue to offer help.      Anxiety and depression    Uncontrolled On Lexapro 10 mg daily Needs to take BuSpar 5 mg  twice daily      Relevant Medications   busPIRone (BUSPAR) 5 MG tablet   Prediabetes    Lab Results  Component Value Date   HGBA1C 6.1 (H) 08/05/2022   Advised to follow low carb diet      Encounter for general adult medical examination with abnormal findings - Primary    Physical exam as documented. Fasting blood tests today.       Meds ordered this encounter  Medications   omeprazole (PRILOSEC) 40 MG capsule    Sig: Take 1 capsule (40 mg total) by mouth daily.    Dispense:  30 capsule    Refill:  3   busPIRone (BUSPAR) 5 MG tablet    Sig: Take 1 tablet (5 mg total) by mouth 2 (two) times daily.    Dispense:  60 tablet    Refill:  4    Follow-up: Return in about 4 months (around 08/02/2023) for MDD and GERD.    Anabel Halon, MD

## 2023-04-02 NOTE — Assessment & Plan Note (Signed)
Physical exam as documented. Fasting blood tests today. 

## 2023-04-06 ENCOUNTER — Ambulatory Visit (INDEPENDENT_AMBULATORY_CARE_PROVIDER_SITE_OTHER): Payer: Federal, State, Local not specified - PPO | Admitting: *Deleted

## 2023-04-06 ENCOUNTER — Other Ambulatory Visit (HOSPITAL_COMMUNITY)
Admission: RE | Admit: 2023-04-06 | Discharge: 2023-04-06 | Disposition: A | Discharge status: Home / Self Care | Payer: Federal, State, Local not specified - PPO | Source: Ambulatory Visit | Attending: Obstetrics & Gynecology | Admitting: Obstetrics & Gynecology

## 2023-04-06 DIAGNOSIS — N898 Other specified noninflammatory disorders of vagina: Secondary | ICD-10-CM | POA: Insufficient documentation

## 2023-04-06 DIAGNOSIS — Z113 Encounter for screening for infections with a predominantly sexual mode of transmission: Secondary | ICD-10-CM | POA: Insufficient documentation

## 2023-04-06 DIAGNOSIS — Z3042 Encounter for surveillance of injectable contraceptive: Secondary | ICD-10-CM

## 2023-04-06 DIAGNOSIS — B3731 Acute candidiasis of vulva and vagina: Secondary | ICD-10-CM | POA: Diagnosis not present

## 2023-04-06 MED ORDER — MEDROXYPROGESTERONE ACETATE 150 MG/ML IM SUSY
150.0000 mg | PREFILLED_SYRINGE | Freq: Once | INTRAMUSCULAR | Status: AC
Start: 2023-04-06 — End: 2023-04-06
  Administered 2023-04-06: 150 mg via INTRAMUSCULAR

## 2023-04-06 NOTE — Progress Notes (Signed)
   NURSE VISIT- INJECTION  SUBJECTIVE:  Kaylee Miller is a 32 y.o. G22P2002 female here for a Depo Provera for contraception/period management. She is a GYN patient. Pt reports vaginal irritation X 2 days.   OBJECTIVE:  There were no vitals taken for this visit.  Appears well, in no apparent distress  Injection administered in: Left upper quad. gluteus  Meds ordered this encounter  Medications   medroxyPROGESTERone Acetate SUSY 150 mg    ASSESSMENT: GYN patient Depo Provera for contraception/period management. Swab sent for GC/CHL, trich, BV and yeast.  PLAN: Follow-up: in 11-13 weeks for next Depo   Malachy Mood  04/06/2023 4:51 PM

## 2023-04-07 ENCOUNTER — Ambulatory Visit: Payer: Federal, State, Local not specified - PPO

## 2023-04-08 ENCOUNTER — Other Ambulatory Visit: Payer: Self-pay | Admitting: Adult Health

## 2023-04-08 MED ORDER — FLUCONAZOLE 150 MG PO TABS: ORAL_TABLET | ORAL | 1 refills | Status: DC

## 2023-04-15 ENCOUNTER — Other Ambulatory Visit: Payer: Self-pay | Admitting: Internal Medicine

## 2023-04-15 ENCOUNTER — Encounter: Payer: Self-pay | Admitting: Internal Medicine

## 2023-04-15 DIAGNOSIS — K649 Unspecified hemorrhoids: Secondary | ICD-10-CM | POA: Insufficient documentation

## 2023-04-15 MED ORDER — HYDROCORTISONE ACETATE 25 MG RE SUPP
25.0000 mg | Freq: Two times a day (BID) | RECTAL | 0 refills | Status: AC
Start: 2023-04-15 — End: ?

## 2023-05-11 ENCOUNTER — Ambulatory Visit (HOSPITAL_COMMUNITY): Payer: Federal, State, Local not specified - PPO

## 2023-05-21 ENCOUNTER — Other Ambulatory Visit: Payer: Self-pay | Admitting: Internal Medicine

## 2023-05-21 DIAGNOSIS — M5126 Other intervertebral disc displacement, lumbar region: Secondary | ICD-10-CM

## 2023-05-21 DIAGNOSIS — M5431 Sciatica, right side: Secondary | ICD-10-CM

## 2023-05-23 ENCOUNTER — Other Ambulatory Visit: Payer: Self-pay | Admitting: Internal Medicine

## 2023-05-23 DIAGNOSIS — F32A Depression, unspecified: Secondary | ICD-10-CM

## 2023-06-08 ENCOUNTER — Ambulatory Visit (HOSPITAL_COMMUNITY): Payer: Federal, State, Local not specified - PPO

## 2023-06-18 IMAGING — US US EXTREM LOW VENOUS*R*
1 series · 13 of 24 positions shown · non-contrast
Comparison: None.

CLINICAL DATA: Right lower extremity pain and edema. History of
smoking. Evaluate for DVT.



[Series 1: us venous img lower uni right (dvt) · portal-venous · 13 of 44 slices shown]
[im 1/44]
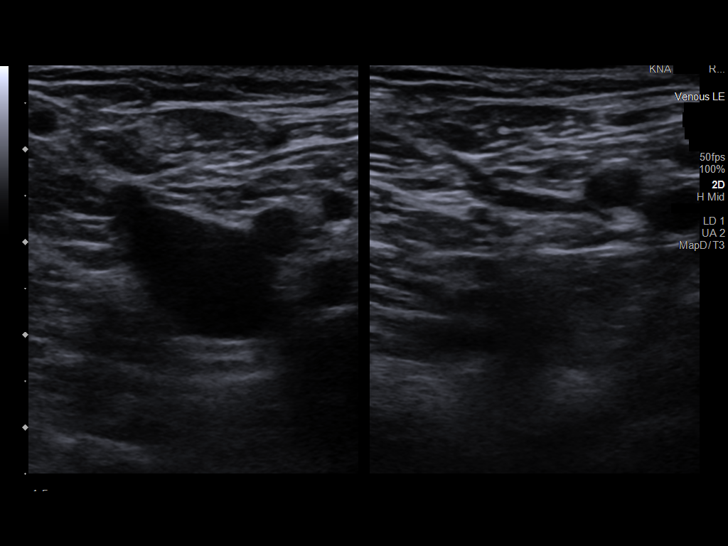
[im 4/44]
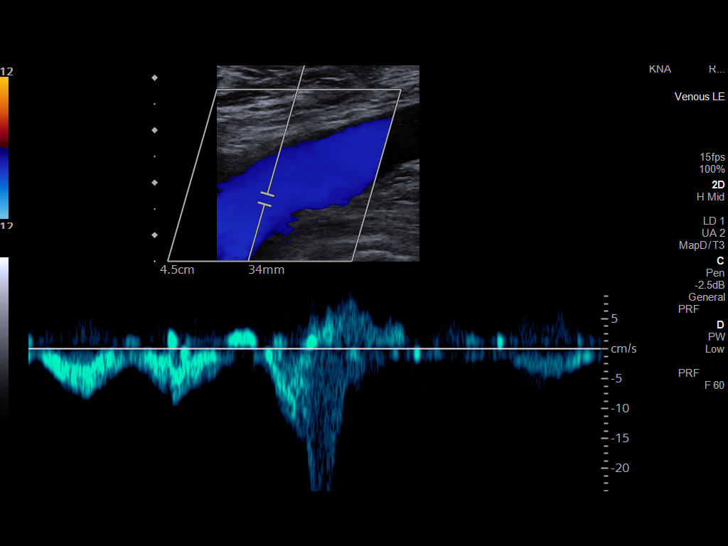
[im 8/44]
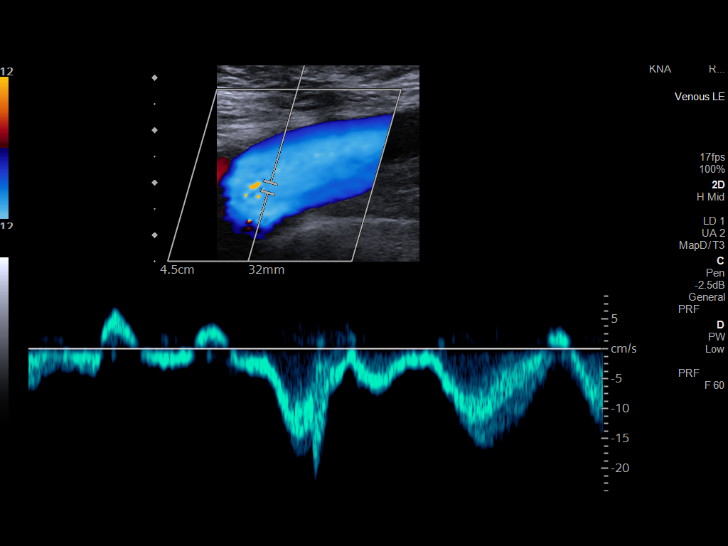
[im 12/44]
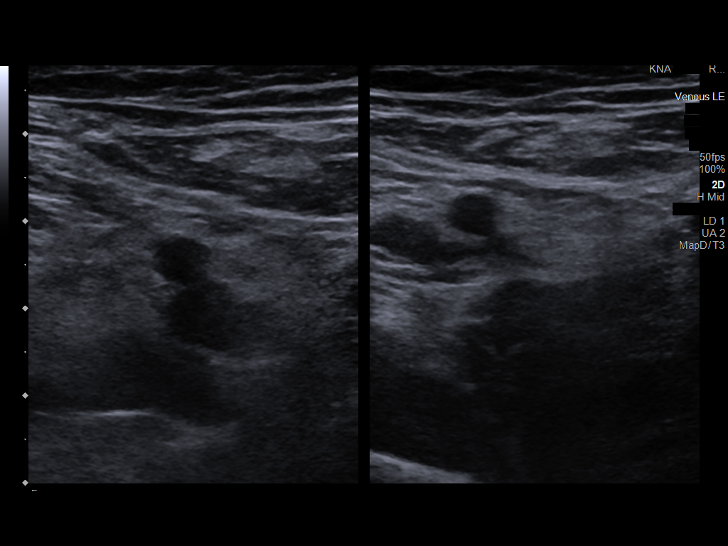
[im 15/44]
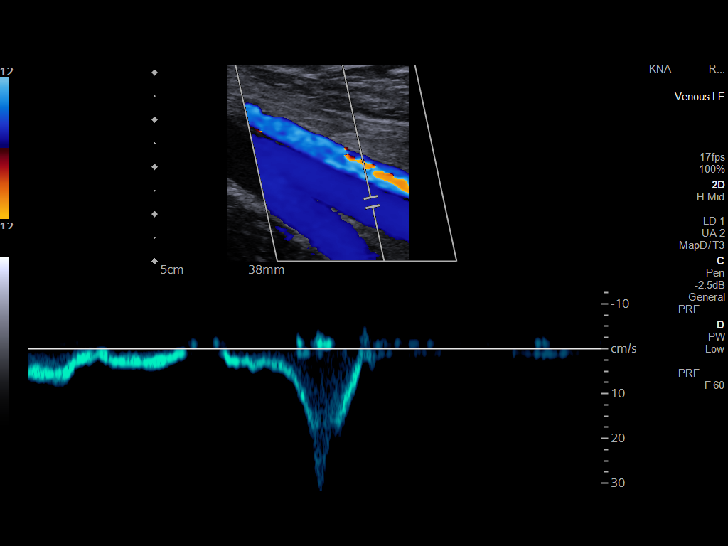
[im 19/44]
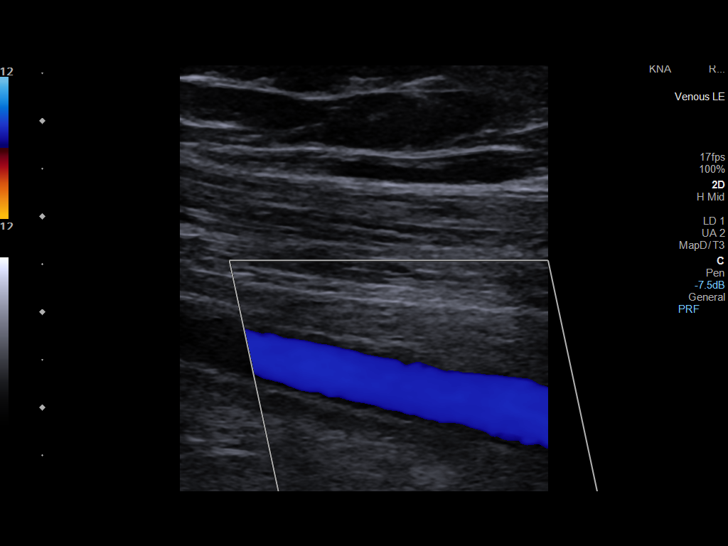
[im 23/44]
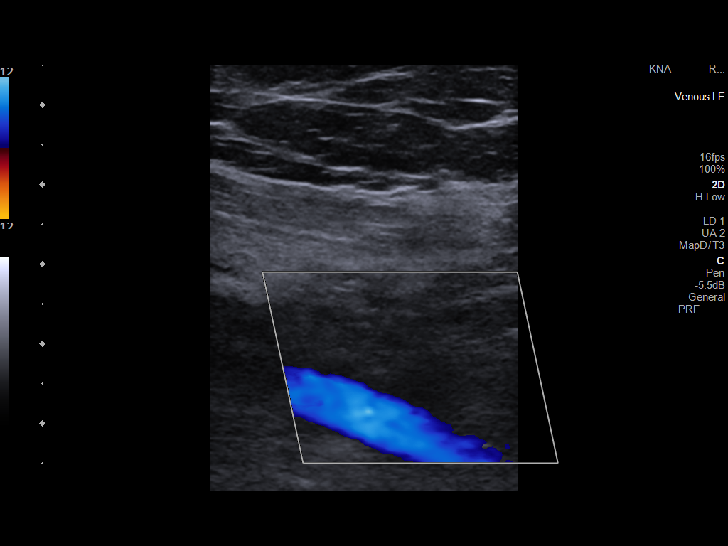
[im 25/44]
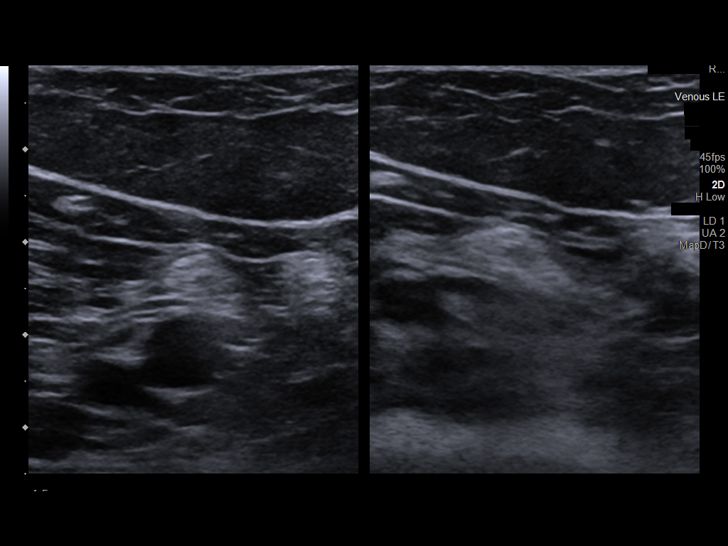
[im 29/44]
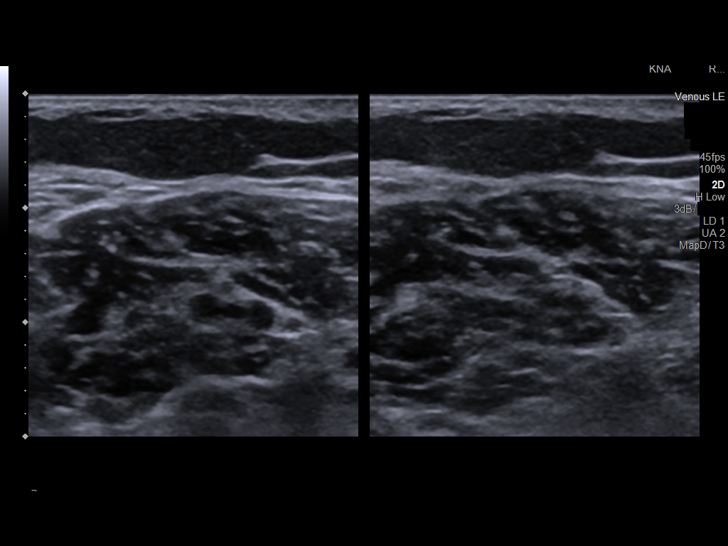
[im 32/44]
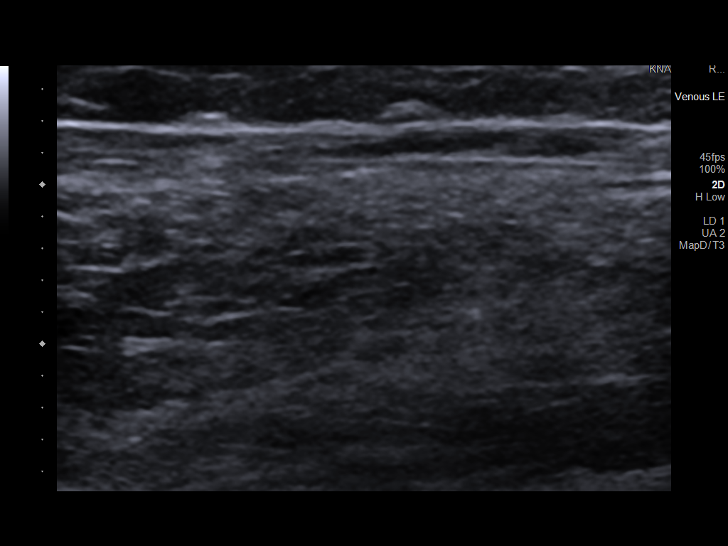
[im 36/44]
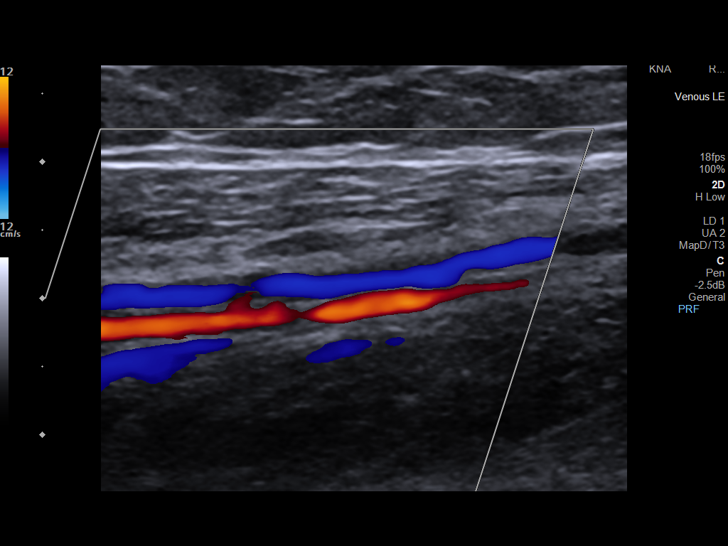
[im 40/44]
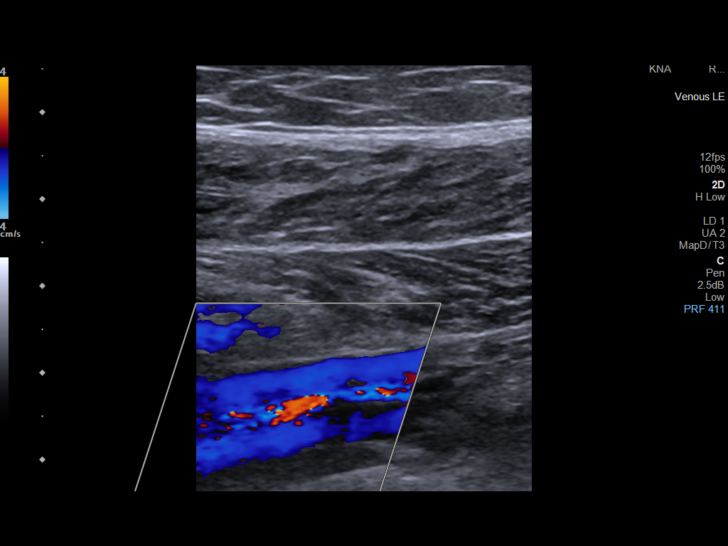
[im 44/44]
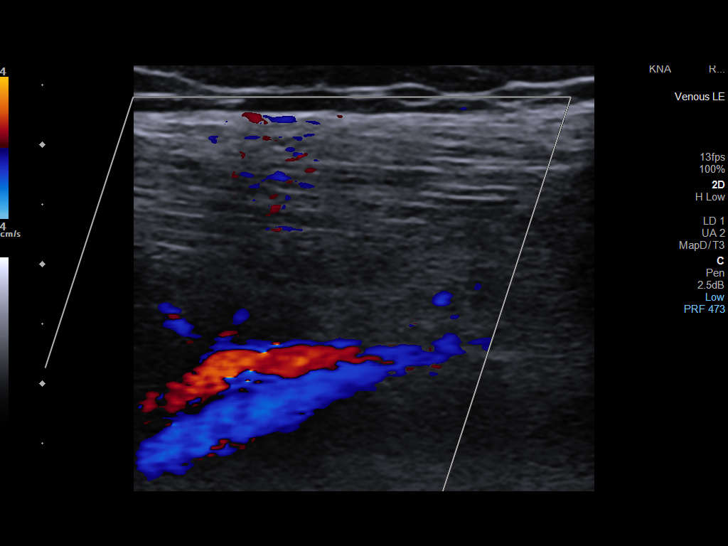

[13 of 24 positions shown; findings below may reference images not displayed]

FINDINGS: Contralateral Common Femoral Vein: Respiratory phasicity is normal
and symmetric with the symptomatic side. No evidence of thrombus.
Normal compressibility.

Common Femoral Vein: No evidence of thrombus. Normal
compressibility, respiratory phasicity and response to augmentation.

Saphenofemoral Junction: No evidence of thrombus. Normal
compressibility and flow on color Doppler imaging.

Profunda Femoral Vein: No evidence of thrombus. Normal
compressibility and flow on color Doppler imaging.

Femoral Vein: No evidence of thrombus. Normal compressibility,
respiratory phasicity and response to augmentation.

Popliteal Vein: No evidence of thrombus. Normal compressibility,
respiratory phasicity and response to augmentation.

Calf Veins: No evidence of thrombus. Normal compressibility and flow
on color Doppler imaging.

Superficial Great Saphenous Vein: No evidence of thrombus. Normal
compressibility.

Venous Reflux:  None.

Other Findings:  None.
IMPRESSION: No evidence of DVT within the right lower extremity.

## 2023-06-26 ENCOUNTER — Ambulatory Visit
Admission: RE | Admit: 2023-06-26 | Discharge: 2023-06-26 | Disposition: A | Discharge status: Home / Self Care | Payer: Federal, State, Local not specified - PPO | Source: Ambulatory Visit | Attending: Family Medicine | Admitting: Family Medicine

## 2023-06-26 VITALS — BP 136/74 | HR 76 | Temp 98.5°F | Resp 18

## 2023-06-26 DIAGNOSIS — S60561A Insect bite (nonvenomous) of right hand, initial encounter: Secondary | ICD-10-CM

## 2023-06-26 DIAGNOSIS — L989 Disorder of the skin and subcutaneous tissue, unspecified: Secondary | ICD-10-CM | POA: Diagnosis not present

## 2023-06-26 DIAGNOSIS — W57XXXA Bitten or stung by nonvenomous insect and other nonvenomous arthropods, initial encounter: Secondary | ICD-10-CM

## 2023-06-26 MED ORDER — TRIAMCINOLONE ACETONIDE 0.1 % EX CREA: 1.0000 | TOPICAL_CREAM | Freq: Two times a day (BID) | CUTANEOUS | 0 refills | Status: AC

## 2023-06-26 MED ORDER — MUPIROCIN 2 % EX OINT: 1.0000 | TOPICAL_OINTMENT | Freq: Two times a day (BID) | CUTANEOUS | 0 refills | Status: AC

## 2023-06-26 NOTE — ED Triage Notes (Signed)
Pt reports spider bite to her right hand x 1 day
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
## 2023-06-26 NOTE — Discharge Instructions (Addendum)
The area on your hand looks consistent with either a sting or insect bite, it is impossible to determine exactly what may have been you.  It does not appear to be infected at this time, I have sent triamcinolone cream for itching and inflammation and mupirocin to keep the area from getting infected.  You may use both of these until improved.  Keep the area clean and dressed at all times.

## 2023-06-29 ENCOUNTER — Other Ambulatory Visit: Payer: Self-pay | Admitting: Internal Medicine

## 2023-06-29 ENCOUNTER — Ambulatory Visit: Payer: Federal, State, Local not specified - PPO

## 2023-06-29 ENCOUNTER — Other Ambulatory Visit: Payer: Self-pay | Admitting: Adult Health

## 2023-06-29 DIAGNOSIS — M5126 Other intervertebral disc displacement, lumbar region: Secondary | ICD-10-CM

## 2023-06-30 ENCOUNTER — Ambulatory Visit: Payer: Federal, State, Local not specified - PPO

## 2023-06-30 ENCOUNTER — Ambulatory Visit (HOSPITAL_COMMUNITY): Payer: Federal, State, Local not specified - PPO

## 2023-07-06 ENCOUNTER — Ambulatory Visit: Payer: Federal, State, Local not specified - PPO | Admitting: *Deleted

## 2023-07-06 DIAGNOSIS — Z3042 Encounter for surveillance of injectable contraceptive: Secondary | ICD-10-CM

## 2023-07-06 MED ORDER — MEDROXYPROGESTERONE ACETATE 150 MG/ML IM SUSY
150.0000 mg | PREFILLED_SYRINGE | Freq: Once | INTRAMUSCULAR | Status: AC
  Administered 2023-07-06: 150 mg via INTRAMUSCULAR

## 2023-07-06 NOTE — Progress Notes (Signed)
   NURSE VISIT- INJECTION  SUBJECTIVE:  Kaylee Miller is a 32 y.o. [id number] female here for a Depo Provera for contraception/period management. She is a GYN patient.   OBJECTIVE:  LMP 06/15/2023 (Approximate) Comment: spotting due to bc  Appears well, in no apparent distress  Injection administered in: Right upper quad. gluteus  Meds ordered this encounter  Medications   medroxyPROGESTERone Acetate SUSY 150 mg    ASSESSMENT: GYN patient Depo Provera for contraception/period management PLAN: Follow-up: in 11-13 weeks for next Depo   Annamarie Dawley  07/06/2023 3:25 PM

## 2023-07-13 ENCOUNTER — Ambulatory Visit (HOSPITAL_COMMUNITY): Payer: Federal, State, Local not specified - PPO

## 2023-07-21 ENCOUNTER — Ambulatory Visit: Payer: Federal, State, Local not specified - PPO | Admitting: Internal Medicine

## 2023-07-21 ENCOUNTER — Encounter: Payer: Self-pay | Admitting: Internal Medicine

## 2023-07-21 VITALS — BP 121/79 | HR 82 | Ht 67.0 in | Wt 222.0 lb

## 2023-07-21 DIAGNOSIS — M5126 Other intervertebral disc displacement, lumbar region: Secondary | ICD-10-CM

## 2023-07-21 DIAGNOSIS — H66001 Acute suppurative otitis media without spontaneous rupture of ear drum, right ear: Secondary | ICD-10-CM

## 2023-07-21 DIAGNOSIS — J0111 Acute recurrent frontal sinusitis: Secondary | ICD-10-CM

## 2023-07-21 MED ORDER — AZITHROMYCIN 250 MG PO TABS: ORAL_TABLET | ORAL | 0 refills | Status: AC

## 2023-07-21 NOTE — Progress Notes (Unsigned)
Acute Office Visit  Subjective:    Patient ID: Kaylee Miller, female    DOB: 03-05-1991, 32 y.o.   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  She has had similar episode of acute low back pain when she has to operate similar equipment, where she has to stay standing and twist her back.  She has history of herniated lumbar disc, s/p lumbar laminectomy with microdiscectomy.  She is currently taking gabapentin 300 mg twice daily and as needed Flexeril.  She has intermittent numbness of the LE.  Denies saddle anesthesia, urinary or stool incontinence.  She also reports nasal congestion, postnasal drip and ear pain for the last 3 days.  Denies any fever, chills, dyspnea or wheezing.  Past Medical History:  Diagnosis Date   Anemia    Anxiety    Asthma    Phreesia 07/02/2020   Contraceptive management 02/08/2015   COVID    mild case   CSF leak 01/29/2022   Depression    GERD (gastroesophageal reflux disease)    Headache(784.0)    migraines   History of bronchitis    HSV-2 (herpes simplex virus 2) infection    Pneumonia    Pregnant    S/P C-section 07/28/2013   Sciatic nerve pain    Seasonal allergies    Vaginal Pap smear, abnormal     Past Surgical History:  Procedure Laterality Date   adenoids     BACK SURGERY     CESAREAN SECTION  08/25/2005   CESAREAN SECTION N/A 07/28/2013   Procedure: CESAREAN SECTION;  Surgeon: [name], MD;  Location: [facility];  Service: Obstetrics;  Laterality: N/A;   LUMBAR LAMINECTOMY/DECOMPRESSION MICRODISCECTOMY Right 05/25/2021   Procedure: DISCECTOMY Lumbar five - Sacral one;  Surgeon: Coletta Memos, MD;  Location: [facility];  Service: Neurosurgery;  Laterality: Right;    LUMBAR LAMINECTOMY/DECOMPRESSION MICRODISCECTOMY Right 01/15/2022   Procedure: Right Lumbar Five-Sacral One Microdiscectomy;  Surgeon: Coletta Memos, MD;  Location: [facility];  Service: Neurosurgery;  Laterality: Right;  3C/RM 19   LUMBAR LAMINECTOMY/DECOMPRESSION MICRODISCECTOMY N/A 01/30/2022   Procedure: REVISION L5-S1 LAMINECTOMY REPAIR CSS LEAK;  Surgeon: Dawley, Alan Mulder, DO;  Location: [facility];  Service: Neurosurgery;  Laterality: N/A;   LUMBAR LAMINECTOMY/DECOMPRESSION MICRODISCECTOMY N/A 02/19/2022   Procedure: Redo Lumbar five-Sacral one Microdiscectomy;  Surgeon: Coletta Memos, MD;  Location: [facility] OR;  Service: Neurosurgery;  Laterality: N/A;   tubes in ears      Family History  Problem Relation Age of Onset   Cancer Mother    Breast cancer Mother    Hypertension Father    Stroke Father    CAD Maternal Grandfather    CAD Paternal Grandmother    CAD Paternal Grandfather    Diabetes Other        great-great grandmother   CAD Other     Social History   Socioeconomic History   Marital status: Single    Spouse name: Not on file   Number of children: Not on file   Years of education: Not on file   Highest education level: 12th grade  Occupational History   Not on file  Tobacco Use  Smoking status: Every Day    Current packs/day: 0.50    Average packs/day: 0.5 packs/day for 9.0 years (4.5 ttl pk-yrs)    Types: Cigarettes    Passive exposure: Never   Smokeless tobacco: Never  Vaping Use   Vaping status: Never Used  Substance and Sexual Activity   Alcohol use: Not Currently    Comment: Occa   Drug use: Never   Sexual activity: Not Currently    Birth control/protection: Injection  Other Topics Concern   Not on file  Social History Narrative   Not on file   Social Determinants of Health   Financial Resource Strain: Medium Risk (07/20/2023)   Overall Financial Resource Strain (CARDIA)    Difficulty of Paying Living Expenses: Somewhat hard  Food Insecurity: Food Insecurity  Present (07/20/2023)   Hunger Vital Sign    Worried About Running Out of Food in the Last Year: Often true    Ran Out of Food in the Last Year: Sometimes true  Transportation Needs: No Transportation Needs (07/20/2023)   PRAPARE - Administrator, Civil Service (Medical): No    Lack of Transportation (Non-Medical): No  Physical Activity: Insufficiently Active (07/20/2023)   Exercise Vital Sign    Days of Exercise per Week: 7 days    Minutes of Exercise per Session: 10 min  Stress: Stress Concern Present (07/20/2023)   [employer] of Occupational Health - Occupational Stress Questionnaire    Feeling of Stress : Very much  Social Connections: Unknown (07/20/2023)   Social Connection and Isolation Panel [NHANES]    Frequency of Communication with Friends and Family: More than three times a week    Frequency of Social Gatherings with Friends and Family: Once a week    Attends Religious Services: 1 to 4 times per year    Active Member of [employer] or Organizations: Yes    Attends Banker Meetings: 1 to 4 times per year    Marital Status: Patient declined  Intimate Partner Violence: Unknown (02/25/2023)   Received from [facility]   HITS    Physically Hurt: Not on file    Insult or Talk Down To: Not on file    Threaten Physical Harm: Not on file    Scream or Curse: Not on file    Outpatient Medications Prior to Visit  Medication Sig Dispense Refill   hydrocortisone (ANUSOL-HC) 25 MG suppository Place 1 suppository (25 mg total) rectally 2 (two) times daily. 12 suppository 0   albuterol (VENTOLIN HFA) 108 (90 Base) MCG/ACT inhaler Inhale 2 puffs into the lungs every 6 (six) hours as needed for wheezing or shortness of breath. 18 g 1   busPIRone (BUSPAR) 5 MG tablet Take 1 tablet (5 mg total) by mouth 2 (two) times daily. 60 tablet 4   cyclobenzaprine (FLEXERIL) 10 MG tablet Take 1 tablet (10 mg total) by mouth 2 (two) times daily as needed for muscle spasms. 20  tablet 0   docusate sodium (COLACE) 100 MG capsule Take 200 mg by mouth daily.     escitalopram (LEXAPRO) 10 MG tablet Take 1 tablet by mouth once daily 90 tablet 0   fluconazole (DIFLUCAN) 150 MG tablet Take 1 now and 1 in 3 days 2 tablet 1   fluticasone (FLONASE) 50 MCG/ACT nasal spray Place 2 sprays into both nostrils daily as needed for allergies or rhinitis.     gabapentin (NEURONTIN) 300 MG capsule Take 1 capsule by mouth twice daily 60 capsule 0  medroxyPROGESTERone Acetate 150 MG/ML SUSY INJECT 1 ML INTO THE MUSCLE EVERY 3 MONTHS 1 mL 4   mupirocin ointment (BACTROBAN) 2 % Apply 1 Application topically 2 (two) times daily. 22 g 0   naproxen (NAPROSYN) 500 MG tablet Take 1 tablet (500 mg total) by mouth 2 (two) times daily with a meal. 60 tablet 3   omeprazole (PRILOSEC) 40 MG capsule Take 1 capsule (40 mg total) by mouth daily. 30 capsule 3   ondansetron (ZOFRAN-ODT) 4 MG disintegrating tablet DISSOLVE 1 TABLET IN MOUTH EVERY 8 HOURS AS NEEDED FOR NAUSEA OR VOMITING 20 tablet 0   triamcinolone cream (KENALOG) 0.1 % Apply 1 Application topically 2 (two) times daily. 30 g 0   No facility-administered medications prior to visit.    Allergies  Allergen Reactions   Latex Swelling    Review of Systems  Constitutional:  Negative for chills and fever.  HENT:  Positive for congestion, ear discharge, ear pain, postnasal drip and sinus pressure. Negative for sore throat.   Eyes:  Negative for pain and discharge.  Respiratory:  Negative for cough and shortness of breath.   Cardiovascular:  Negative for chest pain and palpitations.  Gastrointestinal:  Positive for abdominal pain (Epigastric), constipation and nausea. Negative for diarrhea and vomiting.  Endocrine: Negative for polydipsia and polyuria.  Genitourinary:  Negative for dysuria and hematuria.  Musculoskeletal:  Positive for back pain and neck pain. Negative for neck stiffness.  Skin:  Negative for rash.  Neurological:  Negative  for dizziness, speech difficulty, weakness and headaches.  Psychiatric/Behavioral:  Negative for agitation and behavioral problems.        Objective:    Physical Exam Vitals reviewed.  Constitutional:      General: She is not in acute distress.    Appearance: She is not diaphoretic.  HENT:     Head: Normocephalic and atraumatic.     Nose: Congestion present.     Right Sinus: Frontal sinus tenderness present.     Left Sinus: Frontal sinus tenderness present.     Mouth/Throat:     Mouth: Mucous membranes are moist.     Pharynx: No posterior oropharyngeal erythema.  Eyes:     General: No scleral icterus.    Extraocular Movements: Extraocular movements intact.  Cardiovascular:     Rate and Rhythm: Normal rate and regular rhythm.     Pulses: Normal pulses.     Heart sounds: Normal heart sounds. No murmur heard. Pulmonary:     Breath sounds: Normal breath sounds. No wheezing or rales.  Musculoskeletal:     Cervical back: Neck supple. No tenderness.     Right lower leg: No edema.     Left lower leg: No edema.     Comments: ROM of the lumbar spine limited due to pain, point tenderness in the left lumbar paraspinal area SLR positive b/l  Skin:    General: Skin is warm.     Findings: No rash.  Neurological:     General: No focal deficit present.     Mental Status: She is alert and oriented to person, place, and time.  Psychiatric:        Mood and Affect: Mood normal.        Behavior: Behavior normal.     BP 121/79 (BP Location: Right Arm, Patient Position: Sitting, Cuff Size: Large)   Pulse 82   Ht 5\' 7"  (1.702 m)   Wt 222 lb (100.7 kg)   LMP 06/15/2023 (Approximate) Comment: spotting  due to bc  SpO2 97%   BMI 34.77 kg/m  Wt Readings from Last 3 Encounters:  07/21/23 222 lb (100.7 kg)  04/02/23 226 lb 3.2 oz (102.6 kg)  02/20/23 220 lb (99.8 kg)        Assessment & Plan:   Problem List Items Addressed This Visit       Respiratory   Acute recurrent frontal  sinusitis    Check flu, COVID and RSV test Continue Flonase for nasal congestion/allergies Started azithromycin for otitis media      Relevant Medications   azithromycin (ZITHROMAX) 250 MG tablet   Other Relevant Orders   COVID-19, Flu A+B and RSV     Nervous and Auditory   Non-recurrent acute suppurative otitis media of right ear without spontaneous rupture of tympanic membrane    Considering blood-tinged discharge from ear, started oral azithromycin Advised to avoid using any sharp objects for cleaning purposes      Relevant Medications   azithromycin (ZITHROMAX) 250 MG tablet     Musculoskeletal and Integument   HNP (herniated nucleus pulposus), lumbar - Primary    S/p lumbar laminecomy with microdiscectomy in 10/22 S/p L5-S1 re-do microdiscectomy in 06/23 Recent CT of lumbar spine reviewed Completed PT, needs to do back exercises at home Advised to f/u with Spine surgeon On gabapentin to 300 mg twice daily Flexeril as needed for back muscle spasms Naproxen as needed for back pain Avoid heavy lifting and frequent bending - work note provided to avoid prolonged standing        Meds ordered this encounter  Medications   azithromycin (ZITHROMAX) 250 MG tablet    Sig: Take 2 tablets on day 1, then 1 tablet daily on days 2 through 5    Dispense:  6 tablet    Refill:  0     Maysen Bonsignore Concha Se, MD

## 2023-07-21 NOTE — Patient Instructions (Addendum)
Please continue using Flonase regularly. Okay to take Dayquil/Nyquil as needed for nasal congestion.

## 2023-07-22 DIAGNOSIS — J0111 Acute recurrent frontal sinusitis: Secondary | ICD-10-CM | POA: Insufficient documentation

## 2023-07-22 DIAGNOSIS — H66001 Acute suppurative otitis media without spontaneous rupture of ear drum, right ear: Secondary | ICD-10-CM | POA: Insufficient documentation

## 2023-07-22 NOTE — Assessment & Plan Note (Signed)
Check flu, COVID and RSV test Continue Flonase for nasal congestion/allergies Started azithromycin for otitis media

## 2023-07-22 NOTE — Assessment & Plan Note (Signed)
Considering blood-tinged discharge from ear, started oral azithromycin Advised to avoid using any sharp objects for cleaning purposes

## 2023-07-22 NOTE — Assessment & Plan Note (Signed)
S/p lumbar laminecomy with microdiscectomy in 10/22 S/p L5-S1 re-do microdiscectomy in 06/23 Recent CT of lumbar spine reviewed Completed PT, needs to do back exercises at home Advised to f/u with Spine surgeon On gabapentin to 300 mg twice daily Flexeril as needed for back muscle spasms Naproxen as needed for back pain Avoid heavy lifting and frequent bending - work note provided to avoid prolonged standing

## 2023-07-23 LAB — COVID-19, FLU A+B AND RSV
Influenza A, NAA: NOT DETECTED
Influenza B, NAA: NOT DETECTED
RSV, NAA: NOT DETECTED
SARS-CoV-2, NAA: NOT DETECTED

## 2023-08-04 ENCOUNTER — Ambulatory Visit: Payer: Federal, State, Local not specified - PPO | Admitting: Internal Medicine

## 2023-08-06 ENCOUNTER — Other Ambulatory Visit: Payer: Self-pay | Admitting: Internal Medicine

## 2023-08-06 DIAGNOSIS — M5126 Other intervertebral disc displacement, lumbar region: Secondary | ICD-10-CM

## 2023-09-01 ENCOUNTER — Other Ambulatory Visit: Payer: Self-pay | Admitting: Internal Medicine

## 2023-09-01 DIAGNOSIS — M5126 Other intervertebral disc displacement, lumbar region: Secondary | ICD-10-CM

## 2023-09-01 DIAGNOSIS — F419 Anxiety disorder, unspecified: Secondary | ICD-10-CM

## 2023-09-11 ENCOUNTER — Encounter (INDEPENDENT_AMBULATORY_CARE_PROVIDER_SITE_OTHER): Payer: Self-pay | Admitting: Otolaryngology

## 2023-09-11 ENCOUNTER — Ambulatory Visit: Payer: Federal, State, Local not specified - PPO

## 2023-09-11 ENCOUNTER — Ambulatory Visit: Payer: Federal, State, Local not specified - PPO | Admitting: Family Medicine

## 2023-09-11 ENCOUNTER — Encounter: Payer: Self-pay | Admitting: Family Medicine

## 2023-09-11 VITALS — BP 114/80 | HR 78 | Ht 67.0 in | Wt 224.0 lb

## 2023-09-11 DIAGNOSIS — H6690 Otitis media, unspecified, unspecified ear: Secondary | ICD-10-CM | POA: Insufficient documentation

## 2023-09-11 DIAGNOSIS — M48062 Spinal stenosis, lumbar region with neurogenic claudication: Secondary | ICD-10-CM | POA: Diagnosis not present

## 2023-09-11 DIAGNOSIS — Z8669 Personal history of other diseases of the nervous system and sense organs: Secondary | ICD-10-CM

## 2023-09-11 DIAGNOSIS — H6504 Acute serous otitis media, recurrent, right ear: Secondary | ICD-10-CM | POA: Diagnosis not present

## 2023-09-11 MED ORDER — METHYLPREDNISOLONE ACETATE 80 MG/ML IJ SUSP
40.0000 mg | Freq: Once | INTRAMUSCULAR | Status: AC
  Administered 2023-09-11: 40 mg via INTRAMUSCULAR

## 2023-09-11 MED ORDER — METHYLPREDNISOLONE ACETATE 40 MG/ML IJ SUSP: 40.0000 mg | Freq: Once | INTRAMUSCULAR | Status: AC

## 2023-09-11 MED ORDER — AMOXICILLIN-POT CLAVULANATE 875-125 MG PO TABS: 1.0000 | ORAL_TABLET | Freq: Two times a day (BID) | ORAL | 0 refills | Status: AC

## 2023-09-11 NOTE — Patient Instructions (Signed)

## 2023-09-11 NOTE — Assessment & Plan Note (Signed)
Start Augmentin (Amoxicillin-Clavulanate) 875-125 mg twice daily x 7 days  Recommend using a nasal decongestant or nasal corticosteroid spray to promote Eustachian tube drainage and reduce ear pressure. Encourage warm compresses over the ear to alleviate discomfort and advise staying hydrated. Follow up in 7-10 days to assess resolution of symptoms. Educate the patient on completing the full course of antibiotics and monitoring for red flags such as worsening pain, high fever, or new dizziness

## 2023-09-11 NOTE — Assessment & Plan Note (Signed)
Depo-Medrol 40 mg IM injection given today Advise to follow up with Spine Specialist We discussed the desired effects and potential side effects of the prescribed medication for back pain. Additionally, we reviewed non-pharmacological interventions, including the importance of rest, avoiding twisting, improper bending, and straining the lower back. I demonstrated proper body mechanics to prevent further injury and advised alternating between ice and heat therapy for relief. Stretching exercises for both the back and legs were recommended to improve flexibility and support recovery. The patient was advised to follow up if symptoms worsen or persist. The patient expressed understanding of the treatment plan, and all questions were thoroughly addressed.

## 2023-09-11 NOTE — Addendum Note (Signed)
Addended by: Ricardo Jericho R on: 09/11/2023 12:21 PM   Modules accepted: Orders

## 2023-09-11 NOTE — Progress Notes (Signed)
Established Patient Office Visit   Subjective  Patient ID: Kaylee Miller, female    DOB: 24-Jan-1991  Age: 33 y.o. MRN: [id number]  Chief Complaint  Patient presents with   Acute Visit    Recent head injury since she has  been experiencing Ear pain with drainage, headache, and sinus pressure since Thursday night.     She  has a past medical history of Anemia, Anxiety, Asthma, Contraceptive management (02/08/2015), COVID, CSF leak (01/29/2022), Depression, GERD (gastroesophageal reflux disease), Headache(784.0), History of bronchitis, HSV-2 (herpes simplex virus 2) infection, Pneumonia, Pregnant, S/P C-section (07/28/2013), Sciatic nerve pain, Seasonal allergies, and Vaginal Pap smear, abnormal.  Patient reports pain in the right ear, which has been a recurrent issue. The pain is constant and has been gradually worsening over time. The maximum temperature recorded is a low-grade fever. The pain is rated at a severity of 7/10. Associated symptoms include constant headaches, ear discharge and neck pain. Pertinent negatives include no coughing, rhinorrhea, or sore throat. The patient has attempted treatment with NSAIDs, which provided no relief. Her medical history is significant for chronic ear infections.    Review of Systems  Constitutional:  Positive for fever and malaise/fatigue.  HENT:  Positive for ear discharge, ear pain and sinus pain.   Respiratory:  Negative for shortness of breath.   Cardiovascular:  Negative for chest pain.  Musculoskeletal:  Positive for back pain.  Neurological:  Positive for headaches.      Objective:     BP 114/80   Pulse 78   Ht 5\' 7"  (1.702 m)   Wt 224 lb (101.6 kg)   LMP 09/07/2023   SpO2 96%   BMI 35.08 kg/m  BP Readings from Last 3 Encounters:  09/11/23 114/80  07/21/23 121/79  06/26/23 136/74      Physical Exam Vitals reviewed.  Constitutional:      General: She is not in acute distress.    Appearance: Normal appearance. She is  not ill-appearing, toxic-appearing or diaphoretic.  HENT:     Head: Normocephalic.     Right Ear: Drainage, swelling and tenderness present. Tympanic membrane is erythematous.     Left Ear: Tympanic membrane and ear canal normal. No drainage, swelling or tenderness. Tympanic membrane is not erythematous.  Eyes:     General:        Right eye: No discharge.        Left eye: No discharge.     Conjunctiva/sclera: Conjunctivae normal.  Cardiovascular:     Rate and Rhythm: Normal rate.     Pulses: Normal pulses.     Heart sounds: Normal heart sounds.  Pulmonary:     Effort: Pulmonary effort is normal. No respiratory distress.     Breath sounds: Normal breath sounds.  Abdominal:     Tenderness: There is no right CVA tenderness.  Skin:    General: Skin is warm and dry.     Capillary Refill: Capillary refill takes less than 2 seconds.  Neurological:     Mental Status: She is alert.  Psychiatric:        Mood and Affect: Mood normal.        Behavior: Behavior normal.      No results found for any visits on 09/11/23.  The ASCVD Risk score (Arnett DK, et al., 2019) failed to calculate for the following reasons:   The 2019 ASCVD risk score is only valid for ages 41 to 49    Assessment & Plan:  Recurrent acute serous otitis media of right ear Assessment & Plan: Start Augmentin (Amoxicillin-Clavulanate) 875-125 mg twice daily x 7 days  Recommend using a nasal decongestant or nasal corticosteroid spray to promote Eustachian tube drainage and reduce ear pressure. Encourage warm compresses over the ear to alleviate discomfort and advise staying hydrated. Follow up in 7-10 days to assess resolution of symptoms. Educate the patient on completing the full course of antibiotics and monitoring for red flags such as worsening pain, high fever, or new dizziness       Orders: -     Ambulatory referral to ENT -     Amoxicillin-Pot Clavulanate; Take 1 tablet by mouth 2 (two) times daily for 7  days.  Dispense: 14 tablet; Refill: 0  History of recurrent ear infection -     Ambulatory referral to ENT  Spinal stenosis of lumbar region with neurogenic claudication Assessment & Plan: Depo-Medrol 40 mg IM injection given today Advise to follow up with Spine Specialist We discussed the desired effects and potential side effects of the prescribed medication for back pain. Additionally, we reviewed non-pharmacological interventions, including the importance of rest, avoiding twisting, improper bending, and straining the lower back. I demonstrated proper body mechanics to prevent further injury and advised alternating between ice and heat therapy for relief. Stretching exercises for both the back and legs were recommended to improve flexibility and support recovery. The patient was advised to follow up if symptoms worsen or persist. The patient expressed understanding of the treatment plan, and all questions were thoroughly addressed.   Orders: -     methylPREDNISolone Acetate    Return if symptoms worsen or fail to improve.   Cruzita Lederer Newman Nip, FNP

## 2023-09-16 ENCOUNTER — Encounter: Payer: Self-pay | Admitting: Family Medicine

## 2023-09-16 ENCOUNTER — Ambulatory Visit: Payer: Self-pay | Admitting: Internal Medicine

## 2023-09-16 ENCOUNTER — Telehealth: Payer: Federal, State, Local not specified - PPO | Admitting: Family Medicine

## 2023-09-16 DIAGNOSIS — M5416 Radiculopathy, lumbar region: Secondary | ICD-10-CM

## 2023-09-16 MED ORDER — TRAMADOL HCL 50 MG PO TABS: 50.0000 mg | ORAL_TABLET | Freq: Four times a day (QID) | ORAL | 0 refills | Status: AC | PRN

## 2023-09-16 NOTE — Telephone Encounter (Signed)
  Chief Complaint: Low back pain Symptoms: Pain radiating to buttocks and leg Frequency: Ongoing, worsening with colder weather Pertinent Negatives: Patient denies weakness, numbness, fever Disposition: [] ED /[] Urgent Care (no appt availability in office) / [x] Appointment(In office/virtual)/ []  [facility]/ [] Home Care/ [] Refused Recommended Disposition /[] [facility]/ []  Follow-up with PCP Additional Notes: Pt reports she has been experiencing left LBP and radiating sciatic pain for "quite some time" but it has worsened recently due to the colder weather. Pt reports the pain at 8/10 at the worst but reports she always has a dull ache. Pt denies numbness, weakness, fever. Virtual OV scheduled for today. This RN educated pt on home care, new-worsening symptoms, when to call back/seek emergent care. Pt verbalized understanding and agrees to plan.   Copied from CRM [id number]. Topic: Clinical - Red Word Triage >> Sep 16, 2023  4:11 PM Alvino Blood C wrote: Red Word that prompted transfer to Nurse Triage: Patient is having pain in her lower back area she says she is barley able to move. The pain level is around an 8. Reason for Disposition  [1] MODERATE back pain (e.g., interferes with normal activities) AND [2] present > 3 days  Answer Assessment - Initial Assessment Questions 1. ONSET: "When did the pain begin?"      Worsening with cold weather 2. LOCATION: "Where does it hurt?" (upper, mid or lower back)     Left side low back, buttocks and leg 3. SEVERITY: "How bad is the pain?"  (e.g., Scale 1-10; mild, moderate, or severe)   - MILD (1-3): Doesn't interfere with normal activities.    - MODERATE (4-7): Interferes with normal activities or awakens from sleep.    - SEVERE (8-10): Excruciating pain, unable to do any normal activities.      8/10 4. PATTERN: "Is the pain constant?" (e.g., yes, no; constant, intermittent)      Comes and goes 5. RADIATION: "Does the pain shoot into  your legs or somewhere else?"     Radiaties into buttocks and leg 6. CAUSE:  "What do you think is causing the back pain?"      History of spinal narrowing  8. MEDICINES: "What have you taken so far for the pain?" (e.g., nothing, acetaminophen, NSAIDS)     Gabapentin, Naproxen 9. NEUROLOGIC SYMPTOMS: "Do you have any weakness, numbness, or problems with bowel/bladder control?"     Bladder control, states she can't get there fast enough 10. OTHER SYMPTOMS: "Do you have any other symptoms?" (e.g., fever, abdomen pain, burning with urination, blood in urine)       None  Protocols used: Back Pain-A-AH
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
## 2023-09-28 ENCOUNTER — Ambulatory Visit: Payer: Federal, State, Local not specified - PPO

## 2023-10-01 ENCOUNTER — Ambulatory Visit (INDEPENDENT_AMBULATORY_CARE_PROVIDER_SITE_OTHER): Payer: Federal, State, Local not specified - PPO | Admitting: *Deleted

## 2023-10-01 DIAGNOSIS — Z3042 Encounter for surveillance of injectable contraceptive: Secondary | ICD-10-CM | POA: Diagnosis not present

## 2023-10-01 MED ORDER — MEDROXYPROGESTERONE ACETATE 150 MG/ML IM SUSY
150.0000 mg | PREFILLED_SYRINGE | Freq: Once | INTRAMUSCULAR | Status: AC
  Administered 2023-10-01: 150 mg via INTRAMUSCULAR

## 2023-10-01 NOTE — Progress Notes (Signed)
   NURSE VISIT- INJECTION  SUBJECTIVE:  Kaylee Miller is a 33 y.o. [id number] female here for a Depo Provera  for contraception/period management. She is a GYN patient.   OBJECTIVE:  LMP 09/07/2023   Appears well, in no apparent distress  Injection administered in: Left upper quad. gluteus  Meds ordered this encounter  Medications   medroxyPROGESTERone  Acetate SUSY 150 mg    ASSESSMENT: GYN patient Depo Provera  for contraception/period management PLAN: Follow-up: in 11-13 weeks for next Depo   Rutherford Rover  10/01/2023 3:12 PM

## 2023-10-22 DIAGNOSIS — Z6834 Body mass index (BMI) 34.0-34.9, adult: Secondary | ICD-10-CM | POA: Diagnosis not present

## 2023-10-22 DIAGNOSIS — J069 Acute upper respiratory infection, unspecified: Secondary | ICD-10-CM | POA: Diagnosis not present

## 2023-10-22 DIAGNOSIS — R030 Elevated blood-pressure reading, without diagnosis of hypertension: Secondary | ICD-10-CM | POA: Diagnosis not present

## 2023-10-22 DIAGNOSIS — H6691 Otitis media, unspecified, right ear: Secondary | ICD-10-CM | POA: Diagnosis not present

## 2023-10-23 ENCOUNTER — Ambulatory Visit: Payer: Self-pay | Admitting: Internal Medicine

## 2023-10-23 ENCOUNTER — Telehealth: Payer: Federal, State, Local not specified - PPO | Admitting: Physician Assistant

## 2023-10-23 DIAGNOSIS — J069 Acute upper respiratory infection, unspecified: Secondary | ICD-10-CM | POA: Diagnosis not present

## 2023-10-23 MED ORDER — ONDANSETRON HCL 4 MG PO TABS: 4.0000 mg | ORAL_TABLET | Freq: Three times a day (TID) | ORAL | 0 refills | Status: DC | PRN

## 2023-10-23 MED ORDER — BENZONATATE 100 MG PO CAPS: 100.0000 mg | ORAL_CAPSULE | Freq: Three times a day (TID) | ORAL | 0 refills | Status: AC

## 2023-10-23 NOTE — Progress Notes (Signed)
 [name], ardelean are scheduled for a virtual visit with your provider today.    Just as we do with appointments in the office, we must obtain your consent to participate.  Your consent will be active for this visit and any virtual visit you may have with one of our providers in the next 365 days.    If you have a MyChart account, I can also send a copy of this consent to you electronically.  All virtual visits are billed to your insurance company just like a traditional visit in the office.  As this is a virtual visit, video technology does not allow for your provider to perform a traditional examination.  This may limit your provider's ability to fully assess your condition.  If your provider identifies any concerns that need to be evaluated in person or the need to arrange testing such as labs, EKG, etc, we will make arrangements to do so.    Although advances in technology are sophisticated, we cannot ensure that it will always work on either your end or our end.  If the connection with a video visit is poor, we may have to switch to a telephone visit.  With either a video or telephone visit, we are not always able to ensure that we have a secure connection.   I need to obtain your verbal consent now.   Are you willing to proceed with your visit today?   Britiny L Addis has provided verbal consent on 10/23/2023 for a virtual visit (video or telephone).   Karrie Meres, [place] 10/23/2023  6:17 PM   Date:  10/23/2023   ID:  Miller, Kaylee 1990-10-07, MRN [id number]  Patient Location: Home Provider Location: Home Office   Participants: Patient and Provider for Visit and Wrap up  Method of visit: Video  Location of Patient: Home Location of Provider: Home Office Consent was obtain for visit over the video. Services rendered by provider: Visit was performed via video  A video enabled telemedicine application was used and I verified that I am speaking with the correct person using  two identifiers.  PCP:  Anabel Halon, MD   Chief Complaint:  cough  History of Present Illness:    Kaylee Miller is a 33 y.o. female with history as stated below. Presents video telehealth for an acute care visit  Pt reports she has had a cough that has been intermittently productive. Symptoms started about 2-3 days ago. She reports nasal congestion, mild sob, and mild wheezing. Denies fevers, chest pain. She has had some post tussive emesis and nausea as well.  Pt was seen at [facility] yesterday and had a negative covid/flu/rsv swab. She was told she had an upper respiratory infection and an ear infection. She was started on antibiotics.  Past Medical, Surgical, Social History, Allergies, and Medications have been Reviewed.  Past Medical History:  Diagnosis Date   Anemia    Anxiety    Asthma    Phreesia 07/02/2020   Contraceptive management 02/08/2015   COVID    mild case   CSF leak 01/29/2022   Depression    GERD (gastroesophageal reflux disease)    Headache(784.0)    migraines   History of bronchitis    HSV-2 (herpes simplex virus 2) infection    Pneumonia    Pregnant    S/P C-section 07/28/2013   Sciatic nerve pain    Seasonal allergies    Vaginal Pap smear, abnormal     No  outpatient medications have been marked as taking for the 10/23/23 encounter (Appointment) with [facility] PROVIDER.   Current Facility-Administered Medications for the 10/23/23 encounter (Appointment) with [facility] PROVIDER  Medication   methylPREDNISolone acetate (DEPO-MEDROL) injection 40 mg     Allergies:   Latex   ROS See HPI for history of present illness.  Physical Exam HENT:     Nose: Congestion present.  Pulmonary:     Effort: Pulmonary effort is normal.     Comments: Speaking in full paragraphs             MDM:  Pt with uri sxs. She had a negative swab at [facility] yesterday and was dx with a viral uri and an ear infection. Advised her to continue abx. She is requesting  something for cough and nausea. Tessalon and zofran sent.    Tests Ordered: No orders of the defined types were placed in this encounter.   Medication Changes: No orders of the defined types were placed in this encounter.    Disposition:  Follow up  Signed, Karrie Meres, PA-C  10/23/2023 6:17 PM

## 2023-10-23 NOTE — Telephone Encounter (Signed)
 Message from Ashwood B sent at 10/23/2023  2:46 PM EST  Copied From CRM [phone]. Reason for Triage: Patient went to urgent care 2/27 and was diagnosed with ear infection and upper respiratory infection. Developed coughing and low grade fever and nausea today, would like to know if can get RX or what OTC meds to take.(Already given antibiotics) [phone]   Chief Complaint: cough Symptoms: severe cough, productive greenish sputum at times, coughing spells, n/v Frequency: yesterday Pertinent Negatives: Patient denies fever Disposition: [] ED /[] Urgent Care (no appt availability in office) / [] Appointment(In office/virtual)/ [x]  [facility]/ [] Home Care/ [] Refused Recommended Disposition /[] [facility]/ []  Follow-up with PCP Additional Notes: taking ABX & nyquill to help.  Need medication for coughing and nausea. States "a tickle in throat that I can not get out".  Coughed all night long & so bad that coughing led to throwing up.   Would like Rx to help with cough and nausea.  Reason for Disposition  SEVERE coughing spells (e.g., whooping sound after coughing, vomiting after coughing)  Answer Assessment - Initial Assessment Questions 1. ONSET: "When did the cough begin?"      Started yesterday 2. SEVERITY: "How bad is the cough today?"      severe 3. SPUTUM: "Describe the color of your sputum" (none, dry cough; clear, white, yellow, green)     greenish 4. HEMOPTYSIS: "Are you coughing up any blood?" If so ask: "How much?" (flecks, streaks, tablespoons, etc.)     no 5. DIFFICULTY BREATHING: "Are you having difficulty breathing?" If Yes, ask: "How bad is it?" (e.g., mild, moderate, severe)    - MILD: No SOB at rest, mild SOB with walking, speaks normally in sentences, can lie down, no retractions, pulse < 100.    - MODERATE: SOB at rest, SOB with minimal exertion and prefers to sit, cannot lie down flat, speaks in phrases, mild retractions, audible wheezing, pulse  100-120.    - SEVERE: Very SOB at rest, speaks in single words, struggling to breathe, sitting hunched forward, retractions, pulse > 120      Some: using inhaler and helping a little 6. FEVER: "Do you have a fever?" If Yes, ask: "What is your temperature, how was it measured, and when did it start?"     no 7. CARDIAC HISTORY: "Do you have any history of heart disease?" (e.g., heart attack, congestive heart failure)      no 8. LUNG HISTORY: "Do you have any history of lung disease?"  (e.g., pulmonary embolus, asthma, emphysema)     asthma 9. PE RISK FACTORS: "Do you have a history of blood clots?" (or: recent major surgery, recent prolonged travel, bedridden)     no 10. OTHER SYMPTOMS: "Do you have any other symptoms?" (e.g., runny nose, wheezing, chest pain)       Runny nose, stuffy nose chest congestion, nausea & vomiting, bloody nose 11. PREGNANCY: "Is there any chance you are pregnant?" "When was your last menstrual period?"       N/a 12. TRAVEL: "Have you traveled out of the country in the last month?" (e.g., travel history, exposures)       N/a  Protocols used: Cough - Acute Non-Productive-A-AH

## 2023-10-23 NOTE — Patient Instructions (Addendum)
 Lynden Cyndie Mull, thank you for joining [employer], PA-C for today's virtual visit.  While this provider is not your primary care provider (PCP), if your PCP is located in our provider database this encounter information will be shared with them immediately following your visit.   A [facility] MyChart account gives you access to today's visit and all your visits, tests, and labs performed at [facility] " click here if you don't have a [facility] MyChart account or go to [email or web address]  Consent: (Patient) Kaylee Miller provided verbal consent for this virtual visit at the beginning of the encounter.  Current Medications:  Current Outpatient Medications:    benzonatate (TESSALON) 100 MG capsule, Take 1 capsule (100 mg total) by mouth every 8 (eight) hours for 5 days., Disp: 15 capsule, Rfl: 0   ondansetron (ZOFRAN) 4 MG tablet, Take 1 tablet (4 mg total) by mouth every 8 (eight) hours as needed for nausea or vomiting., Disp: 20 tablet, Rfl: 0   albuterol (VENTOLIN HFA) 108 (90 Base) MCG/ACT inhaler, Inhale 2 puffs into the lungs every 6 (six) hours as needed for wheezing or shortness of breath., Disp: 18 g, Rfl: 1   busPIRone (BUSPAR) 5 MG tablet, Take 1 tablet (5 mg total) by mouth 2 (two) times daily., Disp: 60 tablet, Rfl: 4   docusate sodium (COLACE) 100 MG capsule, Take 200 mg by mouth daily., Disp: , Rfl:    escitalopram (LEXAPRO) 10 MG tablet, Take 1 tablet by mouth once daily, Disp: 90 tablet, Rfl: 0   fluconazole (DIFLUCAN) 150 MG tablet, Take 1 now and 1 in 3 days, Disp: 2 tablet, Rfl: 1   fluticasone (FLONASE) 50 MCG/ACT nasal spray, Place 2 sprays into both nostrils daily as needed for allergies or rhinitis., Disp: , Rfl:    gabapentin (NEURONTIN) 300 MG capsule, Take 1 capsule by mouth twice daily, Disp: 60 capsule, Rfl: 0   hydrocortisone (ANUSOL-HC) 25 MG suppository, Place 1 suppository (25 mg total) rectally 2 (two) times daily., Disp: 12  suppository, Rfl: 0   medroxyPROGESTERone Acetate 150 MG/ML SUSY, INJECT 1 ML INTO THE MUSCLE EVERY 3 MONTHS, Disp: 1 mL, Rfl: 4   mupirocin ointment (BACTROBAN) 2 %, Apply 1 Application topically 2 (two) times daily., Disp: 22 g, Rfl: 0   naproxen (NAPROSYN) 500 MG tablet, Take 1 tablet (500 mg total) by mouth 2 (two) times daily with a meal., Disp: 60 tablet, Rfl: 3   omeprazole (PRILOSEC) 40 MG capsule, Take 1 capsule (40 mg total) by mouth daily., Disp: 30 capsule, Rfl: 3   ondansetron (ZOFRAN-ODT) 4 MG disintegrating tablet, DISSOLVE 1 TABLET IN MOUTH EVERY 8 HOURS AS NEEDED FOR NAUSEA OR VOMITING, Disp: 20 tablet, Rfl: 0   tiZANidine (ZANAFLEX) 4 MG tablet, TAKE 1 TABLET BY MOUTH AT BEDTIME AS NEEDED FOR MUSCLE SPASM, Disp: 30 tablet, Rfl: 0   triamcinolone cream (KENALOG) 0.1 %, Apply 1 Application topically 2 (two) times daily., Disp: 30 g, Rfl: 0  Current Facility-Administered Medications:    methylPREDNISolone acetate (DEPO-MEDROL) injection 40 mg, 40 mg, Intramuscular, Once,    Medications ordered in this encounter:  Meds ordered this encounter  Medications   benzonatate (TESSALON) 100 MG capsule    Sig: Take 1 capsule (100 mg total) by mouth every 8 (eight) hours for 5 days.    Dispense:  15 capsule    Refill:  0   ondansetron (ZOFRAN) 4 MG tablet    Sig: Take 1 tablet (4 mg  total) by mouth every 8 (eight) hours as needed for nausea or vomiting.    Dispense:  20 tablet    Refill:  0     *If you need refills on other medications prior to your next appointment, please contact your pharmacy*  Follow-Up: Call back or seek an in-person evaluation if the symptoms worsen or if the condition fails to improve as anticipated.  [facility] [phone]  Other Instructions Take the antibiotics as directed.   Take the tessalon for cough and zofran for nausea.   Follow up with your regular doctor in 1 week for reassessment and seek care sooner if your symptoms worsen  or fail to improve.   If you have been instructed to have an in-person evaluation today at a local Urgent Care facility, please use the link below. It will take you to a list of all of our available [facility], including address, phone number and hours of operation. Please do not delay care.  [facility]  If you or a family member do not have a primary care provider, use the link below to schedule a visit and establish care. When you choose a [facility] primary care physician or advanced practice provider, you gain a long-term partner in health. Find a Primary Care Provider  Learn more about [facility] in-office and virtual care options: [facility] - Get Care Now

## 2023-10-27 ENCOUNTER — Other Ambulatory Visit: Payer: Self-pay | Admitting: Internal Medicine

## 2023-10-27 DIAGNOSIS — R051 Acute cough: Secondary | ICD-10-CM

## 2023-10-27 MED ORDER — PROMETHAZINE-DM 6.25-15 MG/5ML PO SYRP: 5.0000 mL | ORAL_SOLUTION | Freq: Four times a day (QID) | ORAL | 0 refills | Status: DC | PRN

## 2023-10-27 NOTE — Telephone Encounter (Signed)
 Left detailed vm

## 2023-11-19 ENCOUNTER — Telehealth (INDEPENDENT_AMBULATORY_CARE_PROVIDER_SITE_OTHER): Payer: Self-pay | Admitting: Otolaryngology

## 2023-11-19 NOTE — Telephone Encounter (Signed)
 Confirmed appt date and location with patient for 11/20/2023.

## 2023-11-20 ENCOUNTER — Ambulatory Visit (INDEPENDENT_AMBULATORY_CARE_PROVIDER_SITE_OTHER): Payer: Federal, State, Local not specified - PPO | Admitting: Otolaryngology

## 2023-11-20 VITALS — BP 119/76 | HR 70 | Ht 67.0 in | Wt 223.0 lb

## 2023-11-20 DIAGNOSIS — H60331 Swimmer's ear, right ear: Secondary | ICD-10-CM

## 2023-11-20 DIAGNOSIS — R0981 Nasal congestion: Secondary | ICD-10-CM | POA: Diagnosis not present

## 2023-11-20 DIAGNOSIS — J310 Chronic rhinitis: Secondary | ICD-10-CM | POA: Diagnosis not present

## 2023-11-20 DIAGNOSIS — H6061 Unspecified chronic otitis externa, right ear: Secondary | ICD-10-CM | POA: Insufficient documentation

## 2023-11-20 DIAGNOSIS — J343 Hypertrophy of nasal turbinates: Secondary | ICD-10-CM | POA: Diagnosis not present

## 2023-11-20 DIAGNOSIS — R42 Dizziness and giddiness: Secondary | ICD-10-CM

## 2023-11-20 DIAGNOSIS — R070 Pain in throat: Secondary | ICD-10-CM

## 2023-11-20 MED ORDER — CIPROFLOXACIN-DEXAMETHASONE 0.3-0.1 % OT SUSP: 4.0000 [drp] | Freq: Two times a day (BID) | OTIC | 8 refills | Status: AC

## 2023-11-20 MED ORDER — FLUTICASONE PROPIONATE 50 MCG/ACT NA SUSP: 2.0000 | Freq: Every day | NASAL | 10 refills | Status: AC

## 2023-11-20 NOTE — Progress Notes (Signed)
 Patient ID: Kaylee Miller, female   DOB: [birth year], 33 y.o.   MRN: [id number]  New complaints: Recurrent right ear infections, right ear pain, throat pain, chronic nasal congestion Follow-up: Recurrent dizziness  HPI:  Kaylee Miller is a 33 y.o. female who presents today with multiple complaints, including recurrent right ear infections, right ear pain, throat pain, and chronic nasal congestion.  According to the patient, she has been symptomatic for more than 1 year.  She has also noted occasional right otorrhea.  The patient was previously seen for recurrent dizziness.  She was experiencing recurrent spinning vertigo.  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  She was treated with vestibular rehabilitation.  According to the patient, her dizziness has improved.  She still has occasional off-balance sensation.  The balance exercise has helped.  However, over the past year, she has noted frequent right ear and throat pain.  She has a long history of tobacco use.  She smokes half pack per day for 16 years.  She denies any dysphagia, odynophagia, or dyspnea.  She underwent bilateral myringotomy and tube placement and adenoidectomy as a child.  Past Medical History:  Diagnosis Date   Anemia    Anxiety    Asthma    [employer] 07/02/2020   Contraceptive management 02/08/2015   COVID    mild case   CSF leak 01/29/2022   Depression    GERD (gastroesophageal reflux disease)    Headache(784.0)    migraines   History of bronchitis    HSV-2 (herpes simplex virus 2) infection    Pneumonia    Pregnant    S/P C-section 07/28/2013   Sciatic nerve pain    Seasonal allergies    Vaginal Pap smear, abnormal     Past Surgical History:  Procedure Laterality Date   adenoids     BACK SURGERY     CESAREAN SECTION  08/25/2005   CESAREAN SECTION N/A 07/28/2013   Procedure: CESAREAN SECTION;  Surgeon: [name], MD;  Location: [facility];  Service: Obstetrics;  Laterality: N/A;    LUMBAR LAMINECTOMY/DECOMPRESSION MICRODISCECTOMY Right 05/25/2021   Procedure: DISCECTOMY Lumbar five - Sacral one;  Surgeon: Coletta Memos, MD;  Location: [facility] OR;  Service: Neurosurgery;  Laterality: Right;   LUMBAR LAMINECTOMY/DECOMPRESSION MICRODISCECTOMY Right 01/15/2022   Procedure: Right Lumbar Five-Sacral One Microdiscectomy;  Surgeon: Coletta Memos, MD;  Location: [facility] OR;  Service: Neurosurgery;  Laterality: Right;  3C/RM 19   LUMBAR LAMINECTOMY/DECOMPRESSION MICRODISCECTOMY N/A 01/30/2022   Procedure: REVISION L5-S1 LAMINECTOMY REPAIR CSS LEAK;  Surgeon: Dawley, Alan Mulder, DO;  Location: [facility] OR;  Service: Neurosurgery;  Laterality: N/A;   LUMBAR LAMINECTOMY/DECOMPRESSION MICRODISCECTOMY N/A 02/19/2022   Procedure: Redo Lumbar five-Sacral one Microdiscectomy;  Surgeon: Coletta Memos, MD;  Location: [facility] OR;  Service: Neurosurgery;  Laterality: N/A;   tubes in ears      Family History  Problem Relation Age of Onset   Cancer Mother    Breast cancer Mother    Hypertension Father    Stroke Father    CAD Maternal Grandfather    CAD Paternal Grandmother    CAD Paternal Grandfather    Diabetes Other        great-great grandmother   CAD Other     Social History:  reports that she has been smoking cigarettes. She has a 4.5 pack-year smoking history. She has never been exposed to tobacco smoke. She has never used smokeless tobacco. She reports that she does not currently use alcohol.  She reports that she does not use drugs.  Allergies:  Allergies  Allergen Reactions   Latex Swelling    Prior to Admission medications   Medication Sig Start Date End Date Taking? Authorizing Provider  albuterol (VENTOLIN HFA) 108 (90 Base) MCG/ACT inhaler Inhale 2 puffs into the lungs every 6 (six) hours as needed for wheezing or shortness of breath. 03/17/23  Yes Patel, Earlie Lou, MD  busPIRone (BUSPAR) 5 MG tablet Take 1 tablet (5 mg total) by mouth 2 (two) times daily. 04/02/23  Yes Anabel Halon, MD  docusate  sodium (COLACE) 100 MG capsule Take 200 mg by mouth daily.   Yes [provider]  escitalopram (LEXAPRO) 10 MG tablet Take 1 tablet by mouth once daily 09/01/23  Yes Anabel Halon, MD  fluconazole (DIFLUCAN) 150 MG tablet Take 1 now and 1 in 3 days 04/08/23  Yes Adline Potter, NP  fluticasone (FLONASE) 50 MCG/ACT nasal spray Place 2 sprays into both nostrils daily as needed for allergies or rhinitis.   Yes [provider]  gabapentin (NEURONTIN) 300 MG capsule Take 1 capsule by mouth twice daily 06/29/23  Yes Anabel Halon, MD  hydrocortisone (ANUSOL-HC) 25 MG suppository Place 1 suppository (25 mg total) rectally 2 (two) times daily. 04/15/23  Yes Anabel Halon, MD  medroxyPROGESTERone Acetate 150 MG/ML SUSY INJECT 1 ML INTO THE MUSCLE EVERY 3 MONTHS 06/29/23  Yes Cyril Mourning A, NP  mupirocin ointment (BACTROBAN) 2 % Apply 1 Application topically 2 (two) times daily. 06/26/23  Yes Particia Nearing, PA-C  naproxen (NAPROSYN) 500 MG tablet Take 1 tablet (500 mg total) by mouth 2 (two) times daily with a meal. 12/01/22  Yes Anabel Halon, MD  omeprazole (PRILOSEC) 40 MG capsule Take 1 capsule (40 mg total) by mouth daily. 04/02/23  Yes Anabel Halon, MD  ondansetron (ZOFRAN) 4 MG tablet Take 1 tablet (4 mg total) by mouth every 8 (eight) hours as needed for nausea or vomiting. 10/23/23  Yes Couture, Cortni S, PA-C  ondansetron (ZOFRAN-ODT) 4 MG disintegrating tablet DISSOLVE 1 TABLET IN MOUTH EVERY 8 HOURS AS NEEDED FOR NAUSEA OR VOMITING 06/13/22  Yes Cyril Mourning A, NP  promethazine-dextromethorphan (PROMETHAZINE-DM) 6.25-15 MG/5ML syrup Take 5 mLs by mouth 4 (four) times daily as needed. 10/27/23  Yes Anabel Halon, MD  tiZANidine (ZANAFLEX) 4 MG tablet TAKE 1 TABLET BY MOUTH AT BEDTIME AS NEEDED FOR MUSCLE SPASM 09/01/23  Yes Anabel Halon, MD  triamcinolone cream (KENALOG) 0.1 % Apply 1 Application topically 2 (two) times daily. 06/26/23  Yes Particia Nearing, PA-C    Blood pressure 119/76, pulse 70, height 5\' 7"  (1.702 m), weight 223 lb (101.2 kg), SpO2 97%. Exam: General: Communicates without difficulty, well nourished, no acute distress. Head: Normocephalic, no evidence injury, no tenderness, facial buttresses intact without stepoff. Face/sinus: No tenderness to palpation and percussion. Facial movement is normal and symmetric. Eyes: PERRL, EOMI. No scleral icterus, conjunctivae clear. Neuro: CN II exam reveals vision grossly intact.  No nystagmus at any point of gaze. Ears: Auricles well formed without lesions.  Ear canals are intact without mass or lesion.  The TMs are intact without fluid. Nose: External evaluation reveals normal support and skin without lesions.  Dorsum is intact.  Anterior rhinoscopy reveals congested mucosa over anterior aspect of inferior turbinates and intact septum.  No purulence noted. Oral:  Oral cavity and oropharynx are intact, symmetric, without erythema or edema.  Mucosa is  moist without lesions. Neck: Full range of motion without pain.  There is no significant lymphadenopathy.  No masses palpable.  Thyroid bed within normal limits to palpation.  Parotid glands and submandibular glands equal bilaterally without mass.  Trachea is midline. Neuro:  CN 2-12 grossly intact.   Procedure:  Flexible Fiberoptic Laryngoscopy Anesthesia: None Description: Risks, benefits, and alternatives of flexible endoscopy were explained to the patient. Specific mention was made of the risk of throat numbness with difficulty swallowing, possible bleeding from the nose and mouth, and pain from the procedure.  The patient gave oral consent to proceed.  The flexible scope was inserted into the right nasal cavity and advanced towards the nasopharynx.  Visualized mucosa over the turbinates and septum were congested.  The nasopharynx was clear.  Oropharyngeal walls were symmetric and mobile without lesion, mass, or edema.  Hypopharynx was also  without  lesion or edema.  Larynx was mobile without lesions.  No lesions or asymmetry in the supraglottic larynx.  Arytenoid mucosa was mildly edematous.  Posterior commissure with mild edema and redundant mucosa.  True vocal folds were pale yellow and without mass or lesion.     Assessment: 1.  Recurrent right otitis externa.  However, no acute infection is noted today. 2.  Chronic rhinitis with nasal mucosal congestion and bilateral inferior turbinate hypertrophy. 3.  Throat pain of unknown etiology.  The patient has a long history of tobacco use.  However, no suspicious mass or lesion is noted on today's laryngoscopy examination. 4.  The patient's recurrent dizziness is currently under controlled with her balance exercises.  Plan: 1.  The physical exam and laryngoscopy findings are reviewed with the patient. 2.  The patient is reassured that no acute infection or suspicious lesion is noted on today's examination. 3.  Ciprodex eardrops 4 drops right ear twice daily as needed for future ear infection. 4.  Flonase nasal spray 2 sprays each nostril daily. 5.  The patient should continue with her balance exercises at home. 6.  The patient is encouraged to call with any questions or concerns.  Javonne Dorko W Gumecindo Hopkin 11/20/2023, 9:04 AM

## 2023-12-25 ENCOUNTER — Ambulatory Visit: Payer: Federal, State, Local not specified - PPO | Admitting: *Deleted

## 2023-12-25 DIAGNOSIS — Z3042 Encounter for surveillance of injectable contraceptive: Secondary | ICD-10-CM | POA: Diagnosis not present

## 2023-12-25 MED ORDER — MEDROXYPROGESTERONE ACETATE 150 MG/ML IM SUSY
150.0000 mg | PREFILLED_SYRINGE | Freq: Once | INTRAMUSCULAR | Status: AC
  Administered 2023-12-25: 150 mg via INTRAMUSCULAR

## 2023-12-25 NOTE — Progress Notes (Signed)
   NURSE VISIT- INJECTION  SUBJECTIVE:  Kaylee Miller is a 33 y.o. [id number] female here for a Depo Provera  for contraception/period management. She is a GYN patient.   OBJECTIVE:  There were no vitals taken for this visit.  Appears well, in no apparent distress  Injection administered in: Left upper quad. gluteus  Meds ordered this encounter  Medications   medroxyPROGESTERone  Acetate SUSY 150 mg    ASSESSMENT: GYN patient Depo Provera  for contraception/period management PLAN: Follow-up: in 11-13 weeks for next Depo   Burlon Centrella  12/25/2023 11:00 AM

## 2023-12-31 ENCOUNTER — Encounter: Payer: Self-pay | Admitting: Internal Medicine

## 2023-12-31 ENCOUNTER — Ambulatory Visit: Admitting: Internal Medicine

## 2023-12-31 VITALS — BP 121/82 | HR 85 | Ht 67.0 in | Wt 222.2 lb

## 2023-12-31 DIAGNOSIS — K2950 Unspecified chronic gastritis without bleeding: Secondary | ICD-10-CM | POA: Diagnosis not present

## 2023-12-31 DIAGNOSIS — R110 Nausea: Secondary | ICD-10-CM | POA: Diagnosis not present

## 2023-12-31 DIAGNOSIS — F419 Anxiety disorder, unspecified: Secondary | ICD-10-CM

## 2023-12-31 DIAGNOSIS — F32A Depression, unspecified: Secondary | ICD-10-CM

## 2023-12-31 DIAGNOSIS — M5126 Other intervertebral disc displacement, lumbar region: Secondary | ICD-10-CM | POA: Diagnosis not present

## 2023-12-31 MED ORDER — TIZANIDINE HCL 4 MG PO TABS: 4.0000 mg | ORAL_TABLET | Freq: Every evening | ORAL | 0 refills | Status: DC | PRN

## 2023-12-31 MED ORDER — TRAMADOL HCL 50 MG PO TABS: 50.0000 mg | ORAL_TABLET | Freq: Two times a day (BID) | ORAL | 0 refills | Status: DC | PRN

## 2023-12-31 MED ORDER — ESCITALOPRAM OXALATE 10 MG PO TABS: 10.0000 mg | ORAL_TABLET | Freq: Every day | ORAL | 1 refills | Status: DC

## 2023-12-31 MED ORDER — ONDANSETRON HCL 4 MG PO TABS: 4.0000 mg | ORAL_TABLET | Freq: Three times a day (TID) | ORAL | 0 refills | Status: AC | PRN

## 2023-12-31 MED ORDER — KETOROLAC TROMETHAMINE 60 MG/2ML IM SOLN
60.0000 mg | Freq: Once | INTRAMUSCULAR | Status: AC
  Administered 2023-12-31: 60 mg via INTRAMUSCULAR

## 2023-12-31 MED ORDER — GABAPENTIN 300 MG PO CAPS
300.0000 mg | ORAL_CAPSULE | Freq: Two times a day (BID) | ORAL | 3 refills | Status: AC
Start: 2023-12-31 — End: ?

## 2023-12-31 NOTE — Progress Notes (Signed)
 Acute Office Visit  Subjective:    Patient ID: Kaylee Miller, female    DOB: [birth year], 33 y.o.   MRN: [id number]  Chief Complaint  Patient presents with   Back Pain    Left side back pain radiating down her leg. Would like to discuss going up on the dose. Has been having issues even with going to work.     HPI Patient is in today for, complaint of acute on chronic low back pain, since 12/30/23.  She has had similar episode of acute low back pain when she has to operate heavy equipment at her workplace, where she has to stay standing and twist her back.  She has history of herniated lumbar disc, s/p lumbar laminectomy with microdiscectomy.  She is currently taking gabapentin  300 mg twice daily, but irregularly.  She reports radiating pain in LLE and a muscle cramps in left thigh and leg area.  She has run out of her tizanidine .  She has intermittent numbness of the LE.  Denies saddle anesthesia, urinary or stool incontinence.  GAD: She takes Lexapro  10 mg QD currently.  She takes buspirone  as needed for anxiety spells.  She denies panic episodes recently.  Denies anhedonia, SI or HI currently.  Past Medical History:  Diagnosis Date   Anemia    Anxiety    Asthma    [employer]reesia 07/02/2020   Contraceptive management 02/08/2015   COVID    mild case   CSF leak 01/29/2022   Depression    GERD (gastroesophageal reflux disease)    Headache(784.0)    migraines   History of bronchitis    HSV-2 (herpes simplex virus 2) infection    Pneumonia    Pregnant    S/P C-section 07/28/2013   Sciatic nerve pain    Seasonal allergies    Vaginal Pap smear, abnormal     Past Surgical History:  Procedure Laterality Date   adenoids     BACK SURGERY     CESAREAN SECTION  08/25/2005   CESAREAN SECTION N/A 07/28/2013   Procedure: CESAREAN SECTION;  Surgeon: Albino Hum, MD;  Location: [facility];  Service: Obstetrics;  Laterality: N/A;   LUMBAR LAMINECTOMY/DECOMPRESSION MICRODISCECTOMY Right  05/25/2021   Procedure: DISCECTOMY Lumbar five - Sacral one;  Surgeon: Audie Bleacher, MD;  Location: [facility] OR;  Service: Neurosurgery;  Laterality: Right;   LUMBAR LAMINECTOMY/DECOMPRESSION MICRODISCECTOMY Right 01/15/2022   Procedure: Right Lumbar Five-Sacral One Microdiscectomy;  Surgeon: Audie Bleacher, MD;  Location: [facility] OR;  Service: Neurosurgery;  Laterality: Right;  3C/RM 19   LUMBAR LAMINECTOMY/DECOMPRESSION MICRODISCECTOMY N/A 01/30/2022   Procedure: REVISION L5-S1 LAMINECTOMY REPAIR CSS LEAK;  Surgeon: Dawley, Colby Daub, DO;  Location: [facility] OR;  Service: Neurosurgery;  Laterality: N/A;   LUMBAR LAMINECTOMY/DECOMPRESSION MICRODISCECTOMY N/A 02/19/2022   Procedure: Redo Lumbar five-Sacral one Microdiscectomy;  Surgeon: Audie Bleacher, MD;  Location: [facility] OR;  Service: Neurosurgery;  Laterality: N/A;   tubes in ears      Family History  Problem Relation Age of Onset   Cancer Mother    Breast cancer Mother    Hypertension Father    Stroke Father    CAD Maternal Grandfather    CAD Paternal Grandmother    CAD Paternal Grandfather    Diabetes Other        great-great grandmother   CAD Other     Social History   Socioeconomic History   Marital status: Single    Spouse name: Not on file   Number  of children: Not on file   Years of education: Not on file   Highest education level: 12th grade  Occupational History   Not on file  Tobacco Use   Smoking status: Every Day    Current packs/day: 0.50    Average packs/day: 0.5 packs/day for 9.0 years (4.5 ttl pk-yrs)    Types: Cigarettes    Passive exposure: Never   Smokeless tobacco: Never  Vaping Use   Vaping status: Never Used  Substance and Sexual Activity   Alcohol use: Not Currently    Comment: Occa   Drug use: Never   Sexual activity: Not Currently    Birth control/protection: Injection  Other Topics Concern   Not on file  Social History Narrative   Not on file   Social Drivers of Health   Financial Resource Strain: Medium  Risk (12/30/2023)   Overall Financial Resource Strain (CARDIA)    Difficulty of Paying Living Expenses: Somewhat hard  Food Insecurity: Food Insecurity Present (12/30/2023)   Hunger Vital Sign    Worried About Running Out of Food in the Last Year: Sometimes true    Ran Out of Food in the Last Year: Never true  Transportation Needs: No Transportation Needs (12/30/2023)   PRAPARE - Administrator, Civil Service (Medical): No    Lack of Transportation (Non-Medical): No  Physical Activity: Insufficiently Active (12/30/2023)   Exercise Vital Sign    Days of Exercise per Week: 3 days    Minutes of Exercise per Session: 20 min  Stress: Stress Concern Present (12/30/2023)   [employer] of Occupational Health - Occupational Stress Questionnaire    Feeling of Stress : Very much  Social Connections: Moderately Integrated (12/30/2023)   Social Connection and Isolation Panel [NHANES]    Frequency of Communication with Friends and Family: More than three times a week    Frequency of Social Gatherings with Friends and Family: Three times a week    Attends Religious Services: More than 4 times per year    Active Member of Clubs or Organizations: Yes    Attends Banker Meetings: More than 4 times per year    Marital Status: Never married  Intimate Partner Violence: Unknown (02/25/2023)   Received from [facility]   HITS    Physically Hurt: Not on file    Insult or Talk Down To: Not on file    Threaten Physical Harm: Not on file    Scream or Curse: Not on file    Outpatient Medications Prior to Visit  Medication Sig Dispense Refill   albuterol  (VENTOLIN  HFA) 108 (90 Base) MCG/ACT inhaler Inhale 2 puffs into the lungs every 6 (six) hours as needed for wheezing or shortness of breath. 18 g 1   busPIRone  (BUSPAR ) 5 MG tablet Take 1 tablet (5 mg total) by mouth 2 (two) times daily. 60 tablet 4   docusate sodium  (COLACE) 100 MG capsule Take 200 mg by mouth daily.     fluconazole   (DIFLUCAN ) 150 MG tablet Take 1 now and 1 in 3 days 2 tablet 1   fluticasone  (FLONASE ) 50 MCG/ACT nasal spray Place 2 sprays into both nostrils daily as needed for allergies or rhinitis.     hydrocortisone  (ANUSOL -HC) 25 MG suppository Place 1 suppository (25 mg total) rectally 2 (two) times daily. 12 suppository 0   medroxyPROGESTERone  Acetate 150 MG/ML SUSY INJECT 1 ML INTO THE MUSCLE EVERY 3 MONTHS 1 mL 4   mupirocin  ointment (BACTROBAN ) 2 %  Apply 1 Application topically 2 (two) times daily. 22 g 0   naproxen  (NAPROSYN ) 500 MG tablet Take 1 tablet (500 mg total) by mouth 2 (two) times daily with a meal. 60 tablet 3   omeprazole  (PRILOSEC) 40 MG capsule Take 1 capsule (40 mg total) by mouth daily. 30 capsule 3   triamcinolone  cream (KENALOG ) 0.1 % Apply 1 Application topically 2 (two) times daily. 30 g 0   escitalopram  (LEXAPRO ) 10 MG tablet Take 1 tablet by mouth once daily 90 tablet 0   gabapentin  (NEURONTIN ) 300 MG capsule Take 1 capsule by mouth twice daily 60 capsule 0   ondansetron  (ZOFRAN ) 4 MG tablet Take 1 tablet (4 mg total) by mouth every 8 (eight) hours as needed for nausea or vomiting. 20 tablet 0   ondansetron  (ZOFRAN -ODT) 4 MG disintegrating tablet DISSOLVE 1 TABLET IN MOUTH EVERY 8 HOURS AS NEEDED FOR NAUSEA OR VOMITING 20 tablet 0   promethazine -dextromethorphan (PROMETHAZINE -DM) 6.25-15 MG/5ML syrup Take 5 mLs by mouth 4 (four) times daily as needed. 118 mL 0   tiZANidine  (ZANAFLEX ) 4 MG tablet TAKE 1 TABLET BY MOUTH AT BEDTIME AS NEEDED FOR MUSCLE SPASM 30 tablet 0   fluticasone  (FLONASE ) 50 MCG/ACT nasal spray Place 2 sprays into both nostrils daily. 16 g 10   Facility-Administered Medications Prior to Visit  Medication Dose Route Frequency Provider Last Rate Last Admin   methylPREDNISolone  acetate (DEPO-MEDROL ) injection 40 mg  40 mg Intramuscular Once         Allergies  Allergen Reactions   Latex Swelling    Review of Systems  Constitutional:  Negative for chills  and fever.  HENT:  Negative for congestion and sore throat.   Eyes:  Negative for pain and discharge.  Respiratory:  Negative for cough and shortness of breath.   Cardiovascular:  Negative for chest pain and palpitations.  Gastrointestinal:  Positive for abdominal pain (Epigastric), constipation and nausea. Negative for diarrhea and vomiting.  Endocrine: Negative for polydipsia and polyuria.  Genitourinary:  Negative for dysuria and hematuria.  Musculoskeletal:  Positive for back pain and neck pain. Negative for neck stiffness.  Skin:  Negative for rash.  Neurological:  Negative for dizziness, speech difficulty, weakness and headaches.  Psychiatric/Behavioral:  Negative for agitation and behavioral problems.        Objective:    Physical Exam Vitals reviewed.  Constitutional:      General: She is not in acute distress.    Appearance: She is not diaphoretic.  HENT:     Head: Normocephalic and atraumatic.     Nose: Congestion present.     Right Sinus: Frontal sinus tenderness present.     Left Sinus: Frontal sinus tenderness present.     Mouth/Throat:     Mouth: Mucous membranes are moist.     Pharynx: No posterior oropharyngeal erythema.  Eyes:     General: No scleral icterus.    Extraocular Movements: Extraocular movements intact.  Cardiovascular:     Rate and Rhythm: Normal rate and regular rhythm.     Heart sounds: Normal heart sounds. No murmur heard. Pulmonary:     Breath sounds: Normal breath sounds. No wheezing or rales.  Musculoskeletal:     Cervical back: Neck supple. No tenderness.     Right lower leg: No edema.     Left lower leg: No edema.     Comments: ROM of the lumbar spine limited due to pain, point tenderness in the left lumbar paraspinal area SLR positive  b/l  Skin:    General: Skin is warm.     Findings: No rash.  Neurological:     General: No focal deficit present.     Mental Status: She is alert and oriented to person, place, and time.   Psychiatric:        Mood and Affect: Mood normal.        Behavior: Behavior normal.     BP 121/82   Pulse 85   Ht 5\' 7"  (1.702 m)   Wt 222 lb 3.2 oz (100.8 kg)   SpO2 95%   BMI 34.80 kg/m  Wt Readings from Last 3 Encounters:  12/31/23 222 lb 3.2 oz (100.8 kg)  11/20/23 223 lb (101.2 kg)  09/11/23 224 lb (101.6 kg)        Assessment & Plan:   Problem List Items Addressed This Visit       Digestive   Chronic gastritis without bleeding   Her epigastric pain and nausea are likely due to chronic gastritis Needs to avoid caffeinated drinks Continue omeprazole  40 mg daily PRN Zofran  PRN for nausea Avoid hot and spicy food        Musculoskeletal and Integument   HNP (herniated nucleus pulposus), lumbar - Primary   S/p lumbar laminecomy with microdiscectomy in 10/22 S/p L5-S1 re-do microdiscectomy in 06/23 Previous CT of lumbar spine reviewed Completed PT, needs to do back exercises at home Advised to f/u with Spine surgeon Start gabapentin  to 300 mg twice daily, needs to take it BID instead of QD regularly Refilled tizanidine , as needed for back muscle spasms Toradol  60 mg IM today Avoid heavy lifting and frequent bending - work note provided for 2 days Tramadol  as needed for severe pain, Naproxen  as needed for mild to moderate back pain      Relevant Medications   gabapentin  (NEURONTIN ) 300 MG capsule   tiZANidine  (ZANAFLEX ) 4 MG tablet   traMADol  (ULTRAM ) 50 MG tablet     Other   Anxiety and depression   Well-controlled On Lexapro  10 mg daily Takes BuSpar  5 mg twice daily PRN      Relevant Medications   escitalopram  (LEXAPRO ) 10 MG tablet   Other Visit Diagnoses       Nausea       Relevant Medications   ondansetron  (ZOFRAN ) 4 MG tablet        Meds ordered this encounter  Medications   gabapentin  (NEURONTIN ) 300 MG capsule    Sig: Take 1 capsule (300 mg total) by mouth 2 (two) times daily.    Dispense:  60 capsule    Refill:  3   tiZANidine   (ZANAFLEX ) 4 MG tablet    Sig: Take 1 tablet (4 mg total) by mouth at bedtime as needed for muscle spasms.    Dispense:  30 tablet    Refill:  0   traMADol  (ULTRAM ) 50 MG tablet    Sig: Take 1 tablet (50 mg total) by mouth every 12 (twelve) hours as needed.    Dispense:  15 tablet    Refill:  0   escitalopram  (LEXAPRO ) 10 MG tablet    Sig: Take 1 tablet (10 mg total) by mouth daily.    Dispense:  90 tablet    Refill:  1   ondansetron  (ZOFRAN ) 4 MG tablet    Sig: Take 1 tablet (4 mg total) by mouth every 8 (eight) hours as needed for nausea or vomiting.    Dispense:  20 tablet  Refill:  0   ketorolac  (TORADOL ) injection 60 mg     Azaria Bartell K Tanesia Butner, MD

## 2023-12-31 NOTE — Assessment & Plan Note (Signed)
 Well-controlled On Lexapro  10 mg daily Takes BuSpar  5 mg twice daily PRN

## 2023-12-31 NOTE — Assessment & Plan Note (Addendum)
 Her epigastric pain and nausea are likely due to chronic gastritis Needs to avoid caffeinated drinks Continue omeprazole  40 mg daily PRN Zofran  PRN for nausea Avoid hot and spicy food

## 2023-12-31 NOTE — Assessment & Plan Note (Addendum)
 S/p lumbar laminecomy with microdiscectomy in 10/22 S/p L5-S1 re-do microdiscectomy in 06/23 Previous CT of lumbar spine reviewed Completed PT, needs to do back exercises at home Advised to f/u with Spine surgeon Start gabapentin  to 300 mg twice daily, needs to take it BID instead of QD regularly Refilled tizanidine , as needed for back muscle spasms Toradol  60 mg IM today Avoid heavy lifting and frequent bending - work note provided for 2 days Tramadol  as needed for severe pain, Naproxen  as needed for mild to moderate back pain

## 2023-12-31 NOTE — Patient Instructions (Signed)
 Please start taking Gabapentin  300 mg twice daily.  Please take Tizanidine  as needed for muscle spasms.  Please take Naproxen  as needed for mild-moderate pain. Please take Tramadol  as needed for severe pain.

## 2024-01-25 ENCOUNTER — Ambulatory Visit: Payer: Self-pay

## 2024-01-25 ENCOUNTER — Telehealth: Admitting: Physician Assistant

## 2024-01-25 DIAGNOSIS — H8110 Benign paroxysmal vertigo, unspecified ear: Secondary | ICD-10-CM | POA: Diagnosis not present

## 2024-01-25 MED ORDER — MECLIZINE HCL 25 MG PO TABS: 25.0000 mg | ORAL_TABLET | Freq: Three times a day (TID) | ORAL | 0 refills | Status: AC | PRN

## 2024-01-25 NOTE — Patient Instructions (Signed)
 Kaylee Miller, thank you for joining Angelia Kelp, PA-C for today's virtual visit.  While this provider is not your primary care provider (PCP), if your PCP is located in our provider database this encounter information will be shared with them immediately following your visit.   A Kaylee Miller MyChart account gives you access to today's visit and all your visits, tests, and labs performed at [facility] " click here if you don't have a [facility] MyChart account or go to [email or web address]  Consent: (Patient) Kaylee Miller provided verbal consent for this virtual visit at the beginning of the encounter.  Current Medications:  Current Outpatient Medications:    meclizine  (ANTIVERT ) 25 MG tablet, Take 1 tablet (25 mg total) by mouth 3 (three) times daily as needed for dizziness., Disp: 30 tablet, Rfl: 0   albuterol  (VENTOLIN  HFA) 108 (90 Base) MCG/ACT inhaler, Inhale 2 puffs into the lungs every 6 (six) hours as needed for wheezing or shortness of breath., Disp: 18 g, Rfl: 1   busPIRone  (BUSPAR ) 5 MG tablet, Take 1 tablet (5 mg total) by mouth 2 (two) times daily., Disp: 60 tablet, Rfl: 4   docusate sodium  (COLACE) 100 MG capsule, Take 200 mg by mouth daily., Disp: , Rfl:    escitalopram  (LEXAPRO ) 10 MG tablet, Take 1 tablet (10 mg total) by mouth daily., Disp: 90 tablet, Rfl: 1   fluconazole  (DIFLUCAN ) 150 MG tablet, Take 1 now and 1 in 3 days, Disp: 2 tablet, Rfl: 1   fluticasone  (FLONASE ) 50 MCG/ACT nasal spray, Place 2 sprays into both nostrils daily as needed for allergies or rhinitis., Disp: , Rfl:    fluticasone  (FLONASE ) 50 MCG/ACT nasal spray, Place 2 sprays into both nostrils daily., Disp: 16 g, Rfl: 10   gabapentin  (NEURONTIN ) 300 MG capsule, Take 1 capsule (300 mg total) by mouth 2 (two) times daily., Disp: 60 capsule, Rfl: 3   hydrocortisone  (ANUSOL -HC) 25 MG suppository, Place 1 suppository (25 mg total) rectally 2 (two) times daily., Disp: 12  suppository, Rfl: 0   medroxyPROGESTERone  Acetate 150 MG/ML SUSY, INJECT 1 ML INTO THE MUSCLE EVERY 3 MONTHS, Disp: 1 mL, Rfl: 4   mupirocin  ointment (BACTROBAN ) 2 %, Apply 1 Application topically 2 (two) times daily., Disp: 22 g, Rfl: 0   naproxen  (NAPROSYN ) 500 MG tablet, Take 1 tablet (500 mg total) by mouth 2 (two) times daily with a meal., Disp: 60 tablet, Rfl: 3   omeprazole  (PRILOSEC) 40 MG capsule, Take 1 capsule (40 mg total) by mouth daily., Disp: 30 capsule, Rfl: 3   ondansetron  (ZOFRAN ) 4 MG tablet, Take 1 tablet (4 mg total) by mouth every 8 (eight) hours as needed for nausea or vomiting., Disp: 20 tablet, Rfl: 0   tiZANidine  (ZANAFLEX ) 4 MG tablet, Take 1 tablet (4 mg total) by mouth at bedtime as needed for muscle spasms., Disp: 30 tablet, Rfl: 0   traMADol  (ULTRAM ) 50 MG tablet, Take 1 tablet (50 mg total) by mouth every 12 (twelve) hours as needed., Disp: 15 tablet, Rfl: 0   triamcinolone  cream (KENALOG ) 0.1 %, Apply 1 Application topically 2 (two) times daily., Disp: 30 g, Rfl: 0  Current Facility-Administered Medications:    methylPREDNISolone  acetate (DEPO-MEDROL ) injection 40 mg, 40 mg, Intramuscular, Once,    Medications ordered in this encounter:  Meds ordered this encounter  Medications   meclizine  (ANTIVERT ) 25 MG tablet    Sig: Take 1 tablet (25 mg total) by mouth 3 (three) times daily as needed  for dizziness.    Dispense:  30 tablet    Refill:  0    Supervising Provider:   LAMPTEY, PHILIP O [[id number]]     *If you need refills on other medications prior to your next appointment, please contact your pharmacy*  Follow-Up: Call back or seek an in-person evaluation if the symptoms worsen or if the condition fails to improve as anticipated.  [facility] [phone]  Other Instructions  Benign Positional Vertigo Vertigo is the feeling that you or your surroundings are moving when they are not. Benign positional vertigo is the most common form of  vertigo. This is usually a harmless condition (benign). This condition is positional. This means that symptoms are triggered by certain movements and positions. This condition can be dangerous if it occurs while you are doing something that could cause harm to yourself or others. This includes activities such as driving or operating machinery. What are the causes? The inner ear has fluid-filled canals that help your brain sense movement and balance. When the fluid moves, the brain receives messages about your body's position. With benign positional vertigo, calcium  crystals in the inner ear break free and disturb the inner ear area. This causes your brain to receive confusing messages about your body's position. What increases the risk? You are more likely to develop this condition if: You are a woman. You are 1 years of age or older. You have recently had a head injury. You have an inner ear disease. What are the signs or symptoms? Symptoms of this condition usually happen when you move your head or your eyes in different directions. Symptoms may start suddenly and usually last for less than a minute. They include: Loss of balance and falling. Feeling like you are spinning or moving. Feeling like your surroundings are spinning or moving. Nausea and vomiting. Blurred vision. Dizziness. Involuntary eye movement (nystagmus). Symptoms can be mild and cause only minor problems, or they can be severe and interfere with daily life. Episodes of benign positional vertigo may return (recur) over time. Symptoms may also improve over time. How is this diagnosed? This condition may be diagnosed based on: Your medical history. A physical exam of the head, neck, and ears. Positional tests to check for or stimulate vertigo. You may be asked to turn your head and change positions, such as going from sitting to lying down. A health care provider will watch for symptoms of vertigo. You may be referred to a  health care provider who specializes in ear, nose, and throat problems (ENT or otolaryngologist) or a provider who specializes in disorders of the nervous system (neurologist). How is this treated?  This condition may be treated in a session in which your health care provider moves your head in specific positions to help the displaced crystals in your inner ear move. Treatment for this condition may take several sessions. Surgery may be needed in severe cases, but this is rare. In some cases, benign positional vertigo may resolve on its own in 2-4 weeks. Follow these instructions at home: Safety Move slowly. Avoid sudden body or head movements or certain positions, as told by your health care provider. Avoid driving or operating machinery until your health care provider says it is safe. Avoid doing any tasks that would be dangerous to you or others if vertigo occurs. If you have trouble walking or keeping your balance, try using a cane for stability. If you feel dizzy or unstable, sit down right away. Return  to your normal activities as told by your health care provider. Ask your health care provider what activities are safe for you. General instructions Take over-the-counter and prescription medicines only as told by your health care provider. Drink enough fluid to keep your urine pale yellow. Keep all follow-up visits. This is important. Contact a health care provider if: You have a fever. Your condition gets worse or you develop new symptoms. Your family or friends notice any behavioral changes. You have nausea or vomiting that gets worse. You have numbness or a prickling and tingling sensation. Get help right away if you: Have difficulty speaking or moving. Are always dizzy or faint. Develop severe headaches. Have weakness in your legs or arms. Have changes in your hearing or vision. Develop a stiff neck. Develop sensitivity to light. These symptoms may represent a serious problem  that is an emergency. Do not wait to see if the symptoms will go away. Get medical help right away. Call your local emergency services (911 in the [place] Do not drive yourself to the hospital. Summary Vertigo is the feeling that you or your surroundings are moving when they are not. Benign positional vertigo is the most common form of vertigo. This condition is caused by calcium  crystals in the inner ear that become displaced. This causes a disturbance in an area of the inner ear that helps your brain sense movement and balance. Symptoms include loss of balance and falling, feeling that you or your surroundings are moving, nausea and vomiting, and blurred vision. This condition can be diagnosed based on symptoms, a physical exam, and positional tests. Follow safety instructions as told by your health care provider and keep all follow-up visits. This is important. This information is not intended to replace advice given to you by your health care provider. Make sure you discuss any questions you have with your health care provider. Document Revised: 03/02/2023 Document Reviewed: 03/02/2023 Elsevier Patient Education  2024 Elsevier Inc.  Brandt-Daroff Exercise for Vertigo  The Brandt-Daroff exercise is one of several exercises that can speed up the compensation process and end the symptoms of vertigo. It often is prescribed for people who have benign paroxysmal positional vertigo (BPPV) and sometimes for labyrinthitis. These exercises won't cure these conditions. But over time they can reduce symptoms of vertigo.  People who use this exercise usually are told to do several repetitions of the exercise at least twice a day.  How It Is Done To do the Brandt-Daroff exercise:  Start in an upright, seated position. Move into the lying position on one side with your nose pointed up at about a 45-degree angle. Remain in this position for about 30 seconds (or until the vertigo subsides, whichever is  longer), then move back to the seated position. Repeat steps 2 and 3 on the other side. What To Expect Symptoms sometimes suddenly go away during an exercise period. More often, improvement occurs gradually over a period of weeks or months.  Why It Is Done The Brandt-Daroff exercise and other similar exercises are used to treat BPPV. These exercises are sometimes used to treat labyrinthitis or vestibular neuritis.  How Well It Works These exercises can help your body get used to the confusing signals that are causing your vertigo. This may help you get over your vertigo sooner.  The Brandt-Daroff exercise does not help relieve the symptoms of benign paroxysmal positional vertigo (BPPV) as well as the Semont maneuver or the Epley maneuver.footnote1  Risks There are no risks in  doing these exercises. To avoid hitting your head or developing minor neck injuries, be careful not to lie down too quickly.  References Citations Fife TD, et al. (2008). Practice parameter: Therapies for benign paroxysmal positional vertigo (an evidence-based review). Report of the Quality Standards Subcommittee of the American Academy of Neurology. Neurology, 70(22): [phone]. Current as of: Dec 26, 2020    How to Perform the Epley Maneuver The Epley maneuver is an exercise that relieves symptoms of vertigo. Vertigo is the feeling that you or your surroundings are moving when they are not. When you feel vertigo, you may feel like the room is spinning and may have trouble walking. The Epley maneuver is used for a type of vertigo caused by a calcium  deposit in a part of the inner ear. The maneuver involves changing head positions to help the deposit move out of the area. You can do this maneuver at home whenever you have symptoms of vertigo. You can repeat it in 24 hours if your vertigo has not gone away. Even though the Epley maneuver may relieve your vertigo for a few weeks, it is possible that your symptoms will  return. This maneuver relieves vertigo, but it does not relieve dizziness. What are the risks? If it is done correctly, the Epley maneuver is considered safe. Sometimes it can lead to dizziness or nausea that goes away after a short time. If you develop other symptoms--such as changes in vision, weakness, or numbness--stop doing the maneuver and call your health care provider. Supplies needed: A bed or table. A pillow. How to do the Epley maneuver     Sit on the edge of a bed or table with your back straight and your legs extended or hanging over the edge of the bed or table. Turn your head halfway toward the affected ear or side as told by your health care provider. Lie backward quickly with your head turned until you are lying flat on your back. Your head should dangle (head-hanging position). You may want to position a pillow under your shoulders. Hold this position for at least 30 seconds. If you feel dizzy or have symptoms of vertigo, continue to hold the position until the symptoms stop. Turn your head to the opposite direction until your unaffected ear is facing down. Your head should continue to dangle. Hold this position for at least 30 seconds. If you feel dizzy or have symptoms of vertigo, continue to hold the position until the symptoms stop. Turn your whole body to the same side as your head so that you are positioned on your side. Your head will now be nearly facedown and no longer needs to dangle. Hold for at least 30 seconds. If you feel dizzy or have symptoms of vertigo, continue to hold the position until the symptoms stop. Sit back up. You can repeat the maneuver in 24 hours if your vertigo does not go away. Follow these instructions at home: For 24 hours after doing the Epley maneuver: Keep your head in an upright position. When lying down to sleep or rest, keep your head raised (elevated) with two or more pillows. Avoid excessive neck movements. Activity Do not drive or  use machinery if you feel dizzy. After doing the Epley maneuver, return to your normal activities as told by your health care provider. Ask your health care provider what activities are safe for you. General instructions Drink enough fluid to keep your urine pale yellow. Do not drink alcohol. Take over-the-counter and prescription medicines  only as told by your health care provider. Keep all follow-up visits. This is important. Preventing vertigo symptoms Ask your health care provider if there is anything you should do at home to prevent vertigo. He or she may recommend that you: Keep your head elevated with two or more pillows while you sleep. Do not sleep on the side of your affected ear. Get up slowly from bed. Avoid sudden movements during the day. Avoid extreme head positions or movement, such as looking up or bending over. Contact a health care provider if: Your vertigo gets worse. You have other symptoms, including: Nausea. Vomiting. Headache. Get help right away if you: Have vision changes. Have a headache or neck pain that is severe or getting worse. Cannot stop vomiting. Have new numbness or weakness in any part of your body. These symptoms may represent a serious problem that is an emergency. Do not wait to see if the symptoms will go away. Get medical help right away. Call your local emergency services (911 in the [place] Do not drive yourself to the hospital. Summary Vertigo is the feeling that you or your surroundings are moving when they are not. The Epley maneuver is an exercise that relieves symptoms of vertigo. If the Epley maneuver is done correctly, it is considered safe. This information is not intended to replace advice given to you by your health care provider. Make sure you discuss any questions you have with your health care provider. Document Revised: 05/08/2023 Document Reviewed: 05/08/2023 Elsevier Patient Education  2024 Elsevier Inc.   If you have been  instructed to have an in-person evaluation today at a local Urgent Care facility, please use the link below. It will take you to a list of all of our available [facility], including address, phone number and hours of operation. Please do not delay care.  [facility]  If you or a family member do not have a primary care provider, use the link below to schedule a visit and establish care. When you choose a [facility] primary care physician or advanced practice provider, you gain a long-term partner in health. Find a Primary Care Provider  Learn more about [facility]'s in-office and virtual care options: [facility] - Get Care Now

## 2024-01-25 NOTE — Telephone Encounter (Signed)
  Chief Complaint: dizziness Symptoms: dizziness, room spinning, nausea Frequency: today  Pertinent Negatives: NA Disposition: [] ED /[] Urgent Care (no appt availability in office) / [] Appointment(In office/virtual)/ [x]  [facility]/ [] Home Care/ [] Refused Recommended Disposition /[] [facility]/ []  Follow-up with PCP Additional Notes: pt requesting refill for dizziness, states she had a while ago but unsure if she still has some and if so would be old. Pt states was at work when this started this morning. No appts with PCP today for OV or VV so scheduled pt for VUC today at 1130.   Copied from CRM [id number]. Topic: Clinical - Red Word Triage >> Jan 25, 2024  8:27 AM Kaylee Miller wrote: Red Word that prompted transfer to Nurse Triage: Patient called reporting a dizzy spell and nausea experienced while at work this morning. Patient stated she typically takes Meclizine  prn but is currently out and is inquiring if Dr.Patel can refill the prescription. Reason for Disposition  [1] MODERATE dizziness (e.g., interferes with normal activities) AND [2] has been evaluated by doctor (or NP/PA) for this  Answer Assessment - Initial Assessment Questions 2. LIGHTHEADED: "Do you feel lightheaded?" (e.g., somewhat faint, woozy, weak upon standing)     yes 3. VERTIGO: "Do you feel like either you or the room is spinning or tilting?" (i.e. vertigo)     yes 4. SEVERITY: "How bad is it?"  "Do you feel like you are going to faint?" "Can you stand and walk?"   - MILD: Feels slightly dizzy, but walking normally.   - MODERATE: Feels unsteady when walking, but not falling; interferes with normal activities (e.g., school, work).   - SEVERE: Unable to walk without falling, or requires assistance to walk without falling; feels like passing out now.      Mild to moderate  5. ONSET:  "When did the dizziness begin?"     Today  6. AGGRAVATING FACTORS: "Does anything make it worse?" (e.g., standing, change  in head position)     Change in head position  9. RECURRENT SYMPTOM: "Have you had dizziness before?" If Yes, ask: "When was the last time?" "What happened that time?"     Meclizine  but pt is out of rx 10. OTHER SYMPTOMS: "Do you have any other symptoms?" (e.g., fever, chest pain, vomiting, diarrhea, bleeding)       nausea  Protocols used: Dizziness - Lightheadedness-A-AH
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
## 2024-02-08 ENCOUNTER — Other Ambulatory Visit: Payer: Self-pay | Admitting: Internal Medicine

## 2024-02-08 DIAGNOSIS — M5126 Other intervertebral disc displacement, lumbar region: Secondary | ICD-10-CM

## 2024-03-10 ENCOUNTER — Ambulatory Visit
Admission: RE | Admit: 2024-03-10 | Discharge: 2024-03-10 | Disposition: A | Discharge status: Home / Self Care | Source: Ambulatory Visit | Attending: Family Medicine | Admitting: Family Medicine

## 2024-03-10 VITALS — BP 106/73 | HR 83 | Temp 98.2°F | Resp 20

## 2024-03-10 DIAGNOSIS — L02412 Cutaneous abscess of left axilla: Secondary | ICD-10-CM

## 2024-03-10 MED ORDER — DOXYCYCLINE HYCLATE 100 MG PO CAPS
100.0000 mg | ORAL_CAPSULE | Freq: Two times a day (BID) | ORAL | 0 refills | Status: DC
Start: 2024-03-10 — End: 2024-04-08

## 2024-03-10 NOTE — ED Triage Notes (Signed)
 Pt reports pain in under the left arm feeling like fluid in it making the arm feel heavy. Pain is present.
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
## 2024-03-18 ENCOUNTER — Ambulatory Visit

## 2024-03-18 ENCOUNTER — Ambulatory Visit: Admitting: *Deleted

## 2024-03-18 ENCOUNTER — Encounter: Payer: Self-pay | Admitting: Obstetrics & Gynecology

## 2024-03-18 DIAGNOSIS — Z3042 Encounter for surveillance of injectable contraceptive: Secondary | ICD-10-CM | POA: Diagnosis not present

## 2024-03-18 MED ORDER — MEDROXYPROGESTERONE ACETATE 150 MG/ML IM SUSY
150.0000 mg | PREFILLED_SYRINGE | Freq: Once | INTRAMUSCULAR | Status: AC
  Administered 2024-03-18: 150 mg via INTRAMUSCULAR

## 2024-03-18 NOTE — Progress Notes (Signed)
   NURSE VISIT- INJECTION  SUBJECTIVE:  Kaylee Miller is a 33 y.o. [id number] female here for a Depo Provera  for contraception/period management. She is a GYN patient.   OBJECTIVE:  There were no vitals taken for this visit.  Appears well, in no apparent distress  Injection administered in: Right upper quad. gluteus  Meds ordered this encounter  Medications   medroxyPROGESTERone  Acetate SUSY 150 mg    ASSESSMENT: GYN patient Depo Provera  for contraception/period management PLAN: Follow-up: in 11-13 weeks for next Depo   Terance Pomplun  03/18/2024 9:54 AM

## 2024-03-25 ENCOUNTER — Other Ambulatory Visit: Payer: Self-pay | Admitting: Internal Medicine

## 2024-03-25 DIAGNOSIS — M5126 Other intervertebral disc displacement, lumbar region: Secondary | ICD-10-CM

## 2024-04-08 ENCOUNTER — Ambulatory Visit: Admitting: Internal Medicine

## 2024-04-08 VITALS — BP 129/82 | HR 72 | Ht 67.0 in | Wt 217.4 lb

## 2024-04-08 DIAGNOSIS — E782 Mixed hyperlipidemia: Secondary | ICD-10-CM | POA: Diagnosis not present

## 2024-04-08 DIAGNOSIS — F330 Major depressive disorder, recurrent, mild: Secondary | ICD-10-CM | POA: Insufficient documentation

## 2024-04-08 DIAGNOSIS — Z0001 Encounter for general adult medical examination with abnormal findings: Secondary | ICD-10-CM | POA: Diagnosis not present

## 2024-04-08 DIAGNOSIS — R7303 Prediabetes: Secondary | ICD-10-CM

## 2024-04-08 DIAGNOSIS — M5126 Other intervertebral disc displacement, lumbar region: Secondary | ICD-10-CM | POA: Diagnosis not present

## 2024-04-08 DIAGNOSIS — E559 Vitamin D deficiency, unspecified: Secondary | ICD-10-CM | POA: Diagnosis not present

## 2024-04-08 DIAGNOSIS — F411 Generalized anxiety disorder: Secondary | ICD-10-CM | POA: Diagnosis not present

## 2024-04-08 DIAGNOSIS — H6504 Acute serous otitis media, recurrent, right ear: Secondary | ICD-10-CM

## 2024-04-08 NOTE — Assessment & Plan Note (Signed)
 Anxiety symptoms worse recently due to stress of new school year of her son and financial conditions Takes Lexapro  10 mg once daily Continue BuSpar  5 mg BID If persistent symptoms, will adjust dose of Lexapro 

## 2024-04-08 NOTE — Patient Instructions (Signed)
 Please continue to take medications as prescribed.  Please continue to follow low carb diet and perform moderate exercise/walking at least 150 mins/week.

## 2024-04-08 NOTE — Assessment & Plan Note (Signed)
 S/p lumbar laminecomy with microdiscectomy in 10/22 S/p L5-S1 re-do microdiscectomy in 06/23 Previous CT of lumbar spine reviewed Completed PT, needs to do back exercises at home Advised to f/u with Spine surgeon Continue gabapentin  to 300 mg twice daily, needs to take it BID instead of QD regularly Refilled tizanidine , as needed for back muscle spasms Avoid heavy lifting and frequent bending - work note provided for 2 days Tramadol  as needed for severe pain, Naproxen  as needed for mild to moderate back pain

## 2024-04-08 NOTE — Progress Notes (Signed)
 Established Patient Office Visit  Subjective:  Patient ID: Kaylee Miller, female    DOB: Oct 12, 1990  Age: 33 y.o. MRN: [id number]  CC:  Chief Complaint  Patient presents with   Medical Management of Chronic Issues    4 month f/u , has fmla paperwork to have completed. Needs to renew.     HPI Kaylee Miller is a 33 y.o. female with past medical history of MDD, asthma and lumbar spinal stenosis who presents for annual physical.  MDD and GAD: She has been worried about her son, who will be high school junior this year.  She is concerned about her financial condition due to medical expenses of her son's dental workup.  She currently takes Lexapro  10 mg daily.  She has been taking BuSpar , which she started to taking for the last 3 weeks.  Denies any SI or HI currently.  Gastritis: She takes omeprazole  as needed for epigastric pain.  Denies nausea or vomiting now.  Denies any melena or hematochezia.  Lumbar disc herniation: She has had lumbar laminectomy with microdiscectomy and revision surgeries for chronic low back pain. She is currently taking gabapentin  300 mg twice daily and as needed Flexeril .  She also takes naproxen  as needed for the pain.  She has recurrent left-sided low back pain, which is worse after prolonged standing at her workplace.  She requests a letter for her work restrictions, which was provided today.  Chronic otitis media: She also reports bilateral ear itching and intermittent pain.  She has been evaluated by ENT specialist, who had recommended Ciprodex  eardrops for it.  She denies any fever, chills, hearing difficulty or dizziness currently.  Past Medical History:  Diagnosis Date   Anemia    Anxiety    Asthma    [employer] 07/02/2020   Contraceptive management 02/08/2015   COVID    mild case   CSF leak 01/29/2022   Depression    GERD (gastroesophageal reflux disease)    Headache(784.0)    migraines   History of bronchitis    HSV-2 (herpes simplex virus  2) infection    Pneumonia    Pregnant    S/P C-section 07/28/2013   Sciatic nerve pain    Seasonal allergies    Vaginal Pap smear, abnormal     Past Surgical History:  Procedure Laterality Date   adenoids     BACK SURGERY     CESAREAN SECTION  08/25/2005   CESAREAN SECTION N/A 07/28/2013   Procedure: CESAREAN SECTION;  Surgeon: Norleen LULLA Server, MD;  Location: [facility];  Service: Obstetrics;  Laterality: N/A;   LUMBAR LAMINECTOMY/DECOMPRESSION MICRODISCECTOMY Right 05/25/2021   Procedure: DISCECTOMY Lumbar five - Sacral one;  Surgeon: Gillie Duncans, MD;  Location: [facility] OR;  Service: Neurosurgery;  Laterality: Right;   LUMBAR LAMINECTOMY/DECOMPRESSION MICRODISCECTOMY Right 01/15/2022   Procedure: Right Lumbar Five-Sacral One Microdiscectomy;  Surgeon: Gillie Duncans, MD;  Location: [facility] OR;  Service: Neurosurgery;  Laterality: Right;  3C/RM 19   LUMBAR LAMINECTOMY/DECOMPRESSION MICRODISCECTOMY N/A 01/30/2022   Procedure: REVISION L5-S1 LAMINECTOMY REPAIR CSS LEAK;  Surgeon: Dawley, Lani BROCKS, DO;  Location: [facility] OR;  Service: Neurosurgery;  Laterality: N/A;   LUMBAR LAMINECTOMY/DECOMPRESSION MICRODISCECTOMY N/A 02/19/2022   Procedure: Redo Lumbar five-Sacral one Microdiscectomy;  Surgeon: Gillie Duncans, MD;  Location: [facility] OR;  Service: Neurosurgery;  Laterality: N/A;   tubes in ears      Family History  Problem Relation Age of Onset   Cancer Mother    Breast cancer Mother  Hypertension Father    Stroke Father    CAD Maternal Grandfather    CAD Paternal Grandmother    CAD Paternal Grandfather    Diabetes Other        great-great grandmother   CAD Other     Social History   Socioeconomic History   Marital status: Single    Spouse name: Not on file   Number of children: Not on file   Years of education: Not on file   Highest education level: 12th grade  Occupational History   Not on file  Tobacco Use   Smoking status: Every Day    Current packs/day: 0.50    Average packs/day: 0.5  packs/day for 9.0 years (4.5 ttl pk-yrs)    Types: Cigarettes    Passive exposure: Never   Smokeless tobacco: Never  Vaping Use   Vaping status: Never Used  Substance and Sexual Activity   Alcohol use: Not Currently    Comment: Occa   Drug use: Never   Sexual activity: Not Currently    Birth control/protection: Injection  Other Topics Concern   Not on file  Social History Narrative   Not on file   Social Drivers of Health   Financial Resource Strain: Medium Risk (04/08/2024)   Overall Financial Resource Strain (CARDIA)    Difficulty of Paying Living Expenses: Somewhat hard  Food Insecurity: Food Insecurity Present (04/08/2024)   Hunger Vital Sign    Worried About Running Out of Food in the Last Year: Sometimes true    Ran Out of Food in the Last Year: Never true  Transportation Needs: No Transportation Needs (04/08/2024)   PRAPARE - Administrator, Civil Service (Medical): No    Lack of Transportation (Non-Medical): No  Physical Activity: Insufficiently Active (04/08/2024)   Exercise Vital Sign    Days of Exercise per Week: 3 days    Minutes of Exercise per Session: 30 min  Stress: Stress Concern Present (04/08/2024)   [employer] of Occupational Health - Occupational Stress Questionnaire    Feeling of Stress: Very much  Social Connections: Moderately Integrated (04/08/2024)   Social Connection and Isolation Panel    Frequency of Communication with Friends and Family: More than three times a week    Frequency of Social Gatherings with Friends and Family: Twice a week    Attends Religious Services: More than 4 times per year    Active Member of [employer] or Organizations: Yes    Attends Banker Meetings: More than 4 times per year    Marital Status: Never married  Intimate Partner Violence: Unknown (02/25/2023)   Received from [facility]   HITS    Physically Hurt: Not on file    Insult or Talk Down To: Not on file    Threaten Physical Harm: Not on  file    Scream or Curse: Not on file    Outpatient Medications Prior to Visit  Medication Sig Dispense Refill   albuterol  (VENTOLIN  HFA) 108 (90 Base) MCG/ACT inhaler Inhale 2 puffs into the lungs every 6 (six) hours as needed for wheezing or shortness of breath. 18 g 1   busPIRone  (BUSPAR ) 5 MG tablet Take 1 tablet (5 mg total) by mouth 2 (two) times daily. 60 tablet 4   ciprofloxacin -dexamethasone  (CIPRODEX ) OTIC suspension Place 4 drops into both ears 2 (two) times daily.     docusate sodium  (COLACE) 100 MG capsule Take 200 mg by mouth daily.     escitalopram  (LEXAPRO )  10 MG tablet Take 1 tablet (10 mg total) by mouth daily. 90 tablet 1   fluconazole  (DIFLUCAN ) 150 MG tablet Take 1 now and 1 in 3 days 2 tablet 1   fluticasone  (FLONASE ) 50 MCG/ACT nasal spray Place 2 sprays into both nostrils daily. 16 g 10   gabapentin  (NEURONTIN ) 300 MG capsule Take 1 capsule (300 mg total) by mouth 2 (two) times daily. 60 capsule 3   hydrocortisone  (ANUSOL -HC) 25 MG suppository Place 1 suppository (25 mg total) rectally 2 (two) times daily. 12 suppository 0   meclizine  (ANTIVERT ) 25 MG tablet Take 1 tablet (25 mg total) by mouth 3 (three) times daily as needed for dizziness. 30 tablet 0   medroxyPROGESTERone  Acetate 150 MG/ML SUSY INJECT 1 ML INTO THE MUSCLE EVERY 3 MONTHS 1 mL 4   mupirocin  ointment (BACTROBAN ) 2 % Apply 1 Application topically 2 (two) times daily. 22 g 0   naproxen  (NAPROSYN ) 500 MG tablet TAKE 1 TABLET BY MOUTH TWICE DAILY WITH A MEAL 60 tablet 2   omeprazole  (PRILOSEC) 40 MG capsule Take 1 capsule (40 mg total) by mouth daily. 30 capsule 3   ondansetron  (ZOFRAN ) 4 MG tablet Take 1 tablet (4 mg total) by mouth every 8 (eight) hours as needed for nausea or vomiting. 20 tablet 0   tiZANidine  (ZANAFLEX ) 4 MG tablet TAKE 1 TABLET BY MOUTH AT BEDTIME AS NEEDED FOR MUSCLE SPASM 30 tablet 1   traMADol  (ULTRAM ) 50 MG tablet TAKE 1 TABLET BY MOUTH EVERY 12 HOURS AS NEEDED 15 tablet 0    triamcinolone  cream (KENALOG ) 0.1 % Apply 1 Application topically 2 (two) times daily. 30 g 0   doxycycline  (VIBRAMYCIN ) 100 MG capsule Take 1 capsule (100 mg total) by mouth 2 (two) times daily. 14 capsule 0   fluticasone  (FLONASE ) 50 MCG/ACT nasal spray Place 2 sprays into both nostrils daily as needed for allergies or rhinitis.     Facility-Administered Medications Prior to Visit  Medication Dose Route Frequency Provider Last Rate Last Admin   methylPREDNISolone  acetate (DEPO-MEDROL ) injection 40 mg  40 mg Intramuscular Once         Allergies  Allergen Reactions   Latex Swelling    ROS Review of Systems  Constitutional:  Negative for chills and fever.  HENT:  Negative for congestion, sinus pressure, sinus pain and sore throat.        Bilateral ear itching  Eyes:  Negative for pain and discharge.  Respiratory:  Negative for cough and shortness of breath.   Cardiovascular:  Negative for chest pain and palpitations.  Gastrointestinal:  Positive for constipation. Negative for diarrhea, nausea and vomiting.  Endocrine: Negative for polydipsia and polyuria.  Genitourinary:  Negative for dysuria and hematuria.  Musculoskeletal:  Positive for back pain and neck pain. Negative for neck stiffness.  Skin:  Negative for rash.  Neurological:  Negative for dizziness and weakness.  Psychiatric/Behavioral:  Negative for agitation and behavioral problems.       Objective:    Physical Exam Vitals reviewed.  Constitutional:      General: She is not in acute distress.    Appearance: She is not diaphoretic.  HENT:     Head: Normocephalic and atraumatic.     Ears:     Comments: Erythematous ear canals bilaterally    Nose: Nose normal. No congestion.     Mouth/Throat:     Mouth: Mucous membranes are moist.     Pharynx: No posterior oropharyngeal erythema.  Eyes:  General: No scleral icterus.    Extraocular Movements: Extraocular movements intact.  Cardiovascular:     Rate and  Rhythm: Normal rate and regular rhythm.     Heart sounds: Normal heart sounds. No murmur heard. Pulmonary:     Breath sounds: Normal breath sounds. No wheezing or rales.  Abdominal:     Palpations: Abdomen is soft.     Tenderness: There is no abdominal tenderness.  Musculoskeletal:     Cervical back: Neck supple. No tenderness.     Right lower leg: No edema.     Left lower leg: No edema.     Comments: ROM of the lumbar spine limited due to pain, point tenderness in the left lumbar paraspinal area SLR positive b/l  Skin:    General: Skin is warm.     Findings: No rash.     Comments: Incision site C/D/I  Neurological:     General: No focal deficit present.     Mental Status: She is alert and oriented to person, place, and time.     Cranial Nerves: No cranial nerve deficit.     Sensory: No sensory deficit.     Motor: No weakness.  Psychiatric:        Mood and Affect: Mood normal.        Behavior: Behavior normal.     BP 129/82   Pulse 72   Ht 5' 7 (1.702 m)   Wt 217 lb 6.4 oz (98.6 kg)   SpO2 99%   BMI 34.05 kg/m  Wt Readings from Last 3 Encounters:  04/08/24 217 lb 6.4 oz (98.6 kg)  12/31/23 222 lb 3.2 oz (100.8 kg)  11/20/23 223 lb (101.2 kg)    Lab Results  Component Value Date   TSH 1.480 08/05/2022   Lab Results  Component Value Date   WBC 6.4 08/05/2022   HGB 13.4 08/05/2022   HCT 40.4 08/05/2022   MCV 82 08/05/2022   PLT 254 08/05/2022   Lab Results  Component Value Date   NA 140 04/02/2023   K 4.2 04/02/2023   CO2 21 04/02/2023   GLUCOSE 93 04/02/2023   BUN 9 04/02/2023   CREATININE 0.73 04/02/2023   BILITOT 0.3 04/02/2023   ALKPHOS 68 04/02/2023   AST 21 04/02/2023   ALT 26 04/02/2023   PROT 6.4 04/02/2023   ALBUMIN 4.0 04/02/2023   CALCIUM  9.3 04/02/2023   ANIONGAP 7 02/17/2022   EGFR 111 04/02/2023   Lab Results  Component Value Date   CHOL 187 08/05/2022   Lab Results  Component Value Date   HDL 35 (L) 08/05/2022   Lab Results   Component Value Date   LDLCALC 135 (H) 08/05/2022   Lab Results  Component Value Date   TRIG 94 08/05/2022   Lab Results  Component Value Date   CHOLHDL 5.3 (H) 08/05/2022   Lab Results  Component Value Date   HGBA1C 6.0 (H) 04/02/2023      Assessment & Plan:   Problem List Items Addressed This Visit       Nervous and Auditory   Otitis media   Has been evaluated by ENT specialist, has chronic otitis media Advised to get Ciprodex  eardrops from pharmacy Needs to contact ENT specialist if she has recurrent symptoms Advised to avoid using Q-tips for now        Musculoskeletal and Integument   HNP (herniated nucleus pulposus), lumbar   S/p lumbar laminecomy with microdiscectomy in 10/22 S/p L5-S1 re-do microdiscectomy  in 06/23 Previous CT of lumbar spine reviewed Completed PT, needs to do back exercises at home Advised to f/u with Spine surgeon Continue gabapentin  to 300 mg twice daily, needs to take it BID instead of QD regularly Refilled tizanidine , as needed for back muscle spasms Avoid heavy lifting and frequent bending - work note provided for 2 days Tramadol  as needed for severe pain, Naproxen  as needed for mild to moderate back pain        Other   Prediabetes   Lab Results  Component Value Date   HGBA1C 6.0 (H) 04/02/2023   Advised to follow low carb diet      Relevant Orders   Hemoglobin A1c   CMP14+EGFR   Encounter for general adult medical examination with abnormal findings - Primary   Physical exam as documented. Fasting blood tests today.      Mild episode of recurrent major depressive disorder (HCC)   Overall well controlled with Lexapro  10 mg once daily Has been recently stressed at home If persistent symptoms, will adjust dose of Lexapro       Relevant Orders   TSH   CMP14+EGFR   CBC with Differential/Platelet   GAD (generalized anxiety disorder)   Anxiety symptoms worse recently due to stress of new school year of her son and  financial conditions Takes Lexapro  10 mg once daily Continue BuSpar  5 mg BID If persistent symptoms, will adjust dose of Lexapro       Relevant Orders   TSH   Other Visit Diagnoses       Mixed hyperlipidemia       Relevant Orders   Lipid panel     Vitamin D  deficiency       Relevant Orders   VITAMIN D  25 Hydroxy (Vit-D Deficiency, Fractures)        No orders of the defined types were placed in this encounter.   Follow-up: Return in about 4 months (around 08/08/2024) for GAD and back pain.    Suzzane MARLA Blanch, MD

## 2024-04-08 NOTE — Assessment & Plan Note (Signed)
 Overall well controlled with Lexapro  10 mg once daily Has been recently stressed at home If persistent symptoms, will adjust dose of Lexapro 

## 2024-04-08 NOTE — Assessment & Plan Note (Addendum)
 Has been evaluated by ENT specialist, has chronic otitis media Advised to get Ciprodex  eardrops from pharmacy Needs to contact ENT specialist if she has recurrent symptoms Advised to avoid using Q-tips for now

## 2024-04-08 NOTE — Assessment & Plan Note (Signed)
 Lab Results  Component Value Date   HGBA1C 6.0 (H) 04/02/2023   Advised to follow low carb diet

## 2024-04-08 NOTE — Assessment & Plan Note (Signed)
 Physical exam as documented. Fasting blood tests today.

## 2024-04-09 LAB — CMP14+EGFR
ALT: 12 IU/L (ref 0–32)
AST: 10 IU/L (ref 0–40)
Albumin: 4.3 g/dL (ref 3.9–4.9)
Alkaline Phosphatase: 70 IU/L (ref 44–121)
BUN/Creatinine Ratio: 12 (ref 9–23)
BUN: 10 mg/dL (ref 6–20)
Bilirubin Total: 0.3 mg/dL (ref 0.0–1.2)
CO2: 21 mmol/L (ref 20–29)
Calcium: 9.3 mg/dL (ref 8.7–10.2)
Chloride: 106 mmol/L (ref 96–106)
Creatinine, Ser: 0.81 mg/dL (ref 0.57–1.00)
Globulin, Total: 2.4 g/dL (ref 1.5–4.5)
Glucose: 97 mg/dL (ref 70–99)
Potassium: 3.9 mmol/L (ref 3.5–5.2)
Sodium: 143 mmol/L (ref 134–144)
Total Protein: 6.7 g/dL (ref 6.0–8.5)
eGFR: 97 mL/min/1.73 (ref >59)

## 2024-04-09 LAB — TSH: TSH: 1.04 u[IU]/mL (ref 0.450–4.500)

## 2024-04-09 LAB — LIPID PANEL
Chol/HDL Ratio: 5.1 ratio — ABNORMAL HIGH (ref 0.0–4.4)
Cholesterol, Total: 188 mg/dL (ref 100–199)
HDL: 37 mg/dL — ABNORMAL LOW (ref >39)
LDL Chol Calc (NIH): 135 mg/dL — ABNORMAL HIGH (ref 0–99)
Triglycerides: 87 mg/dL (ref 0–149)
VLDL Cholesterol Cal: 16 mg/dL (ref 5–40)

## 2024-04-09 LAB — CBC WITH DIFFERENTIAL/PLATELET
Basophils Absolute: 0.1 x10E3/uL (ref 0.0–0.2)
Basos: 1 %
EOS (ABSOLUTE): 1.1 x10E3/uL — ABNORMAL HIGH (ref 0.0–0.4)
Eos: 12 %
Hematocrit: 41.3 % (ref 34.0–46.6)
Hemoglobin: 13 g/dL (ref 11.1–15.9)
Immature Grans (Abs): 0 x10E3/uL (ref 0.0–0.1)
Immature Granulocytes: 0 %
Lymphocytes Absolute: 4.2 x10E3/uL — ABNORMAL HIGH (ref 0.7–3.1)
Lymphs: 47 %
MCH: 27.3 pg (ref 26.6–33.0)
MCHC: 31.5 g/dL (ref 31.5–35.7)
MCV: 87 fL (ref 79–97)
Monocytes Absolute: 0.6 x10E3/uL (ref 0.1–0.9)
Monocytes: 6 %
Neutrophils Absolute: 3.1 x10E3/uL (ref 1.4–7.0)
Neutrophils: 34 %
Platelets: 256 x10E3/uL (ref 150–450)
RBC: 4.76 x10E6/uL (ref 3.77–5.28)
RDW: 13.1 % (ref 11.7–15.4)
WBC: 9 x10E3/uL (ref 3.4–10.8)

## 2024-04-09 LAB — HEMOGLOBIN A1C
Est. average glucose Bld gHb Est-mCnc: 117 mg/dL
Hgb A1c MFr Bld: 5.7 % — ABNORMAL HIGH (ref 4.8–5.6)

## 2024-04-09 LAB — VITAMIN D 25 HYDROXY (VIT D DEFICIENCY, FRACTURES): Vit D, 25-Hydroxy: 27.1 ng/mL — ABNORMAL LOW (ref 30.0–100.0)

## 2024-04-11 ENCOUNTER — Ambulatory Visit: Payer: Self-pay | Admitting: Internal Medicine

## 2024-04-15 ENCOUNTER — Encounter: Payer: Self-pay | Admitting: Radiology

## 2024-05-06 ENCOUNTER — Ambulatory Visit: Admitting: Internal Medicine

## 2024-05-10 ENCOUNTER — Other Ambulatory Visit: Payer: Self-pay | Admitting: Internal Medicine

## 2024-05-10 DIAGNOSIS — M5126 Other intervertebral disc displacement, lumbar region: Secondary | ICD-10-CM

## 2024-05-10 DIAGNOSIS — F419 Anxiety disorder, unspecified: Secondary | ICD-10-CM

## 2024-06-10 ENCOUNTER — Other Ambulatory Visit (HOSPITAL_COMMUNITY)
Admission: RE | Admit: 2024-06-10 | Discharge: 2024-06-10 | Disposition: A | Discharge status: Home / Self Care | Source: Ambulatory Visit | Attending: Obstetrics & Gynecology | Admitting: Obstetrics & Gynecology

## 2024-06-10 ENCOUNTER — Ambulatory Visit

## 2024-06-10 DIAGNOSIS — N898 Other specified noninflammatory disorders of vagina: Secondary | ICD-10-CM

## 2024-06-10 DIAGNOSIS — Z3042 Encounter for surveillance of injectable contraceptive: Secondary | ICD-10-CM

## 2024-06-10 MED ORDER — MEDROXYPROGESTERONE ACETATE 150 MG/ML IM SUSY
150.0000 mg | PREFILLED_SYRINGE | Freq: Once | INTRAMUSCULAR | Status: AC
  Administered 2024-06-10: 150 mg via INTRAMUSCULAR

## 2024-06-10 NOTE — Progress Notes (Signed)
   NURSE VISIT- INJECTION  SUBJECTIVE:  Kaylee Miller is a 33 y.o. G22P2002 female here for a Depo Provera  for contraception/period management. She is a GYN patient.  Patient states she also wants to do a self swab. She thinks [she] is getting a yeast infection. Experiencing vaginal irritation OBJECTIVE:  There were no vitals taken for this visit.  Appears well, in no apparent distress  Injection administered in: Left upper quad. gluteus  Meds ordered this encounter  Medications   medroxyPROGESTERone  Acetate SUSY 150 mg    ASSESSMENT: GYN patient Depo Provera  for contraception/period management Swab obtained and sent to lab PLAN: Follow-up: in 11-13 weeks for next Depo   Aleck FORBES Blase  06/10/2024 9:59 AM

## 2024-06-13 LAB — CERVICOVAGINAL ANCILLARY ONLY
Bacterial Vaginitis (gardnerella): NEGATIVE
Candida Glabrata: NEGATIVE
Candida Vaginitis: POSITIVE — AB
Chlamydia: NEGATIVE
Comment: NEGATIVE
Comment: NEGATIVE
Comment: NEGATIVE
Comment: NEGATIVE
Comment: NEGATIVE
Comment: NORMAL
Neisseria Gonorrhea: NEGATIVE
Trichomonas: NEGATIVE

## 2024-06-14 ENCOUNTER — Ambulatory Visit: Payer: Self-pay | Admitting: Adult Health

## 2024-06-14 MED ORDER — FLUCONAZOLE 150 MG PO TABS: ORAL_TABLET | ORAL | 1 refills | Status: AC

## 2024-06-27 ENCOUNTER — Encounter: Payer: Self-pay | Admitting: Radiology

## 2024-08-08 ENCOUNTER — Ambulatory Visit: Admitting: Internal Medicine

## 2024-09-01 ENCOUNTER — Other Ambulatory Visit: Payer: Self-pay | Admitting: Adult Health

## 2024-09-02 ENCOUNTER — Ambulatory Visit: Admitting: *Deleted

## 2024-09-02 DIAGNOSIS — Z3042 Encounter for surveillance of injectable contraceptive: Secondary | ICD-10-CM | POA: Diagnosis not present

## 2024-09-02 MED ORDER — MEDROXYPROGESTERONE ACETATE 150 MG/ML IM SUSY
150.0000 mg | PREFILLED_SYRINGE | Freq: Once | INTRAMUSCULAR | Status: AC
  Administered 2024-09-02: 150 mg via INTRAMUSCULAR

## 2024-09-02 NOTE — Progress Notes (Signed)
" ° °  NURSE VISIT- INJECTION  SUBJECTIVE:  Kaylee Miller is a 34 y.o. [id number] female here for a Depo Provera  for contraception/period management. She is a GYN patient.   OBJECTIVE:  There were no vitals taken for this visit.  Appears well, in no apparent distress  Injection administered in: Right upper quad. gluteus  Meds ordered this encounter  Medications   medroxyPROGESTERone  Acetate SUSY 150 mg    ASSESSMENT: GYN patient Depo Provera  for contraception/period management PLAN: Follow-up: in 11-13 weeks for next Depo   Alan CROME Fischer  09/02/2024 9:30 AM  "

## 2024-09-19 ENCOUNTER — Ambulatory Visit: Admitting: Internal Medicine

## 2024-10-27 ENCOUNTER — Ambulatory Visit: Admitting: Internal Medicine

## 2024-11-01 ENCOUNTER — Ambulatory Visit: Admitting: Internal Medicine

## 2024-11-25 ENCOUNTER — Ambulatory Visit: Admitting: Obstetrics & Gynecology

## 2024-12-02 ENCOUNTER — Ambulatory Visit: Admitting: Obstetrics & Gynecology
